# Patient Record
Sex: Female | Born: 1940 | Race: Black or African American | Hispanic: No | Marital: Married | Smoking: Former smoker
Health system: Southern US, Community
[De-identification: ages and names within clinical notes are randomized; demographics above are authoritative.]

## PROBLEM LIST (undated history)

## (undated) DIAGNOSIS — D219 Benign neoplasm of connective and other soft tissue, unspecified: Secondary | ICD-10-CM

## (undated) DIAGNOSIS — I10 Essential (primary) hypertension: Secondary | ICD-10-CM

## (undated) DIAGNOSIS — H269 Unspecified cataract: Secondary | ICD-10-CM

## (undated) DIAGNOSIS — E119 Type 2 diabetes mellitus without complications: Secondary | ICD-10-CM

## (undated) DIAGNOSIS — E66811 Obesity, class 1: Secondary | ICD-10-CM

## (undated) DIAGNOSIS — H409 Unspecified glaucoma: Secondary | ICD-10-CM

## (undated) DIAGNOSIS — E785 Hyperlipidemia, unspecified: Secondary | ICD-10-CM

## (undated) DIAGNOSIS — M810 Age-related osteoporosis without current pathological fracture: Secondary | ICD-10-CM

## (undated) DIAGNOSIS — R002 Palpitations: Secondary | ICD-10-CM

## (undated) DIAGNOSIS — M1990 Unspecified osteoarthritis, unspecified site: Secondary | ICD-10-CM

## (undated) DIAGNOSIS — E669 Obesity, unspecified: Secondary | ICD-10-CM

## (undated) HISTORY — PX: TUBAL LIGATION: SHX77

## (undated) HISTORY — DX: Unspecified glaucoma: H40.9

## (undated) HISTORY — DX: Obesity, unspecified: E66.9

## (undated) HISTORY — DX: Unspecified cataract: H26.9

## (undated) HISTORY — DX: Palpitations: R00.2

## (undated) HISTORY — DX: Essential (primary) hypertension: I10

## (undated) HISTORY — DX: Benign neoplasm of connective and other soft tissue, unspecified: D21.9

## (undated) HISTORY — DX: Hyperlipidemia, unspecified: E78.5

## (undated) HISTORY — DX: Unspecified osteoarthritis, unspecified site: M19.90

## (undated) HISTORY — PX: ABDOMINAL HYSTERECTOMY: SHX81

## (undated) HISTORY — DX: Type 2 diabetes mellitus without complications: E11.9

## (undated) HISTORY — DX: Obesity, class 1: E66.811

## (undated) HISTORY — DX: Age-related osteoporosis without current pathological fracture: M81.0

---

## 1982-06-01 HISTORY — PX: KNEE SURGERY: SHX244

## 1999-03-11 ENCOUNTER — Encounter: Admission: RE | Admit: 1999-03-11 | Discharge: 1999-06-09 | Payer: Self-pay | Admitting: Emergency Medicine

## 2002-12-22 ENCOUNTER — Encounter: Payer: Self-pay | Admitting: Gastroenterology

## 2006-06-24 ENCOUNTER — Ambulatory Visit: Payer: Self-pay | Admitting: Gastroenterology

## 2006-07-07 ENCOUNTER — Ambulatory Visit: Payer: Self-pay | Admitting: Gastroenterology

## 2008-12-25 ENCOUNTER — Encounter: Payer: Self-pay | Admitting: Gastroenterology

## 2009-02-22 ENCOUNTER — Ambulatory Visit: Payer: Self-pay | Admitting: Gastroenterology

## 2009-02-22 DIAGNOSIS — E785 Hyperlipidemia, unspecified: Secondary | ICD-10-CM

## 2009-02-22 DIAGNOSIS — Z860100 Personal history of colon polyps, unspecified: Secondary | ICD-10-CM | POA: Insufficient documentation

## 2009-02-22 DIAGNOSIS — D509 Iron deficiency anemia, unspecified: Secondary | ICD-10-CM

## 2009-02-22 DIAGNOSIS — E1169 Type 2 diabetes mellitus with other specified complication: Secondary | ICD-10-CM | POA: Insufficient documentation

## 2009-02-25 ENCOUNTER — Encounter: Payer: Self-pay | Admitting: Gastroenterology

## 2009-02-25 ENCOUNTER — Ambulatory Visit: Payer: Self-pay | Admitting: Gastroenterology

## 2009-02-26 ENCOUNTER — Telehealth: Payer: Self-pay | Admitting: Gastroenterology

## 2009-02-26 ENCOUNTER — Encounter: Payer: Self-pay | Admitting: Gastroenterology

## 2009-02-27 ENCOUNTER — Encounter: Payer: Self-pay | Admitting: Gastroenterology

## 2009-03-25 ENCOUNTER — Ambulatory Visit: Payer: Self-pay | Admitting: Gastroenterology

## 2009-03-25 ENCOUNTER — Telehealth: Payer: Self-pay | Admitting: Gastroenterology

## 2009-03-25 LAB — CONVERTED CEMR LAB
Basophils Absolute: 0 10*3/uL (ref 0.0–0.1)
Basophils Relative: 1 % (ref 0.0–3.0)
Eosinophils Absolute: 0.2 10*3/uL (ref 0.0–0.7)
Eosinophils Relative: 3.9 % (ref 0.0–5.0)
HCT: 32.9 % — ABNORMAL LOW (ref 36.0–46.0)
Lymphocytes Relative: 43.6 % (ref 12.0–46.0)
MCV: 87.6 fL (ref 78.0–100.0)
Neutrophils Relative %: 42.5 % — ABNORMAL LOW (ref 43.0–77.0)

## 2009-03-26 ENCOUNTER — Ambulatory Visit: Payer: Self-pay | Admitting: Gastroenterology

## 2009-06-25 ENCOUNTER — Telehealth: Payer: Self-pay | Admitting: Gastroenterology

## 2009-06-25 ENCOUNTER — Encounter: Payer: Self-pay | Admitting: Gastroenterology

## 2009-07-08 ENCOUNTER — Ambulatory Visit: Payer: Self-pay | Admitting: Gastroenterology

## 2009-07-08 LAB — CONVERTED CEMR LAB
OCCULT 1: NEGATIVE
OCCULT 2: NEGATIVE
OCCULT 3: NEGATIVE
OCCULT 4: NEGATIVE

## 2009-08-23 ENCOUNTER — Encounter: Admission: RE | Admit: 2009-08-23 | Discharge: 2009-08-23 | Payer: Self-pay | Admitting: Cardiology

## 2009-08-30 ENCOUNTER — Ambulatory Visit (HOSPITAL_COMMUNITY): Admission: RE | Admit: 2009-08-30 | Discharge: 2009-08-30 | Payer: Self-pay | Admitting: Cardiology

## 2009-08-30 HISTORY — PX: CARDIAC CATHETERIZATION: SHX172

## 2009-09-05 ENCOUNTER — Inpatient Hospital Stay (HOSPITAL_COMMUNITY): Admission: RE | Admit: 2009-09-05 | Discharge: 2009-09-09 | Payer: Self-pay | Admitting: Orthopedic Surgery

## 2009-09-05 HISTORY — PX: JOINT REPLACEMENT: SHX530

## 2010-07-03 NOTE — Letter (Signed)
Summary: Results Letter  [facility]  [address] [place], [place] [address]   Phone: [phone]  Fax: [phone]        July 08, 2009 MRN: [id number]    [facility] [address] [place], [place]  [address]    Dear Ms. Rindfleisch,  Your hemeoccult cards testing for microscopic bleeding were negative.  No further GI workup is required at this time.   Please continue with the recommendations that we previously discussed. Should you have any further questions or concerns, feel free to contact me.    Sincerely,  Barbette Hair. Arlyce Dice, M.D., [facility]          Sincerely,  Louis Meckel MD  This letter has been electronically signed by your physician.  Appended Document: Results Letter letter mailed

## 2010-07-03 NOTE — Progress Notes (Signed)
Summary: Follow up labs/hemoccults  Phone Note Outgoing Call Call back at [facility] Phone [phone]   Call placed by: Merri Ray CMA Duncan Dull),  June 25, 2009 10:32 AM Summary of Call: Called pt to remind her she needs 3 month follow up labs drawn. Orders in IDX. Also will mail follow up hemoccult cards for pt. She said she would try to be in sometime this week Initial call taken by: Merri Ray CMA Duncan Dull),  June 25, 2009 10:32 AM     Appended Document: Follow up labs/hemoccults Her labs are in Dr Marzetta Board folder. Drawn 06/25/2009 CMP,CBC

## 2010-08-20 LAB — BASIC METABOLIC PANEL
BUN: 13 mg/dL (ref 6–23)
CO2: 28 mEq/L (ref 19–32)
CO2: 30 mEq/L (ref 19–32)
Chloride: 103 mEq/L (ref 96–112)
Chloride: 108 mEq/L (ref 96–112)
Creatinine, Ser: 0.77 mg/dL (ref 0.4–1.2)
GFR calc Af Amer: >60 mL/min (ref >60)
GFR calc Af Amer: >60 mL/min (ref >60)
GFR calc non Af Amer: >60 mL/min (ref >60)
Glucose, Bld: 154 mg/dL — ABNORMAL HIGH (ref 70–99)
Glucose, Bld: 167 mg/dL — ABNORMAL HIGH (ref 70–99)
Potassium: 3.4 mEq/L — ABNORMAL LOW (ref 3.5–5.1)
Sodium: 136 mEq/L (ref 135–145)
Sodium: 139 mEq/L (ref 135–145)

## 2010-08-20 LAB — CBC
HCT: 24 % — ABNORMAL LOW (ref 36.0–46.0)
HCT: 24 % — ABNORMAL LOW (ref 36.0–46.0)
HCT: 24.6 % — ABNORMAL LOW (ref 36.0–46.0)
HCT: 27.9 % — ABNORMAL LOW (ref 36.0–46.0)
HCT: 34.2 % — ABNORMAL LOW (ref 36.0–46.0)
Hemoglobin: 11.4 g/dL — ABNORMAL LOW (ref 12.0–15.0)
Hemoglobin: 8 g/dL — ABNORMAL LOW (ref 12.0–15.0)
Hemoglobin: 9.4 g/dL — ABNORMAL LOW (ref 12.0–15.0)
MCHC: 33.3 g/dL (ref 30.0–36.0)
MCHC: 33.5 g/dL (ref 30.0–36.0)
MCHC: 33.5 g/dL (ref 30.0–36.0)
MCV: 86.7 fL (ref 78.0–100.0)
MCV: 87.2 fL (ref 78.0–100.0)
MCV: 87.2 fL (ref 78.0–100.0)
Platelets: 195 10*3/uL (ref 150–400)
Platelets: 227 10*3/uL (ref 150–400)
Platelets: 308 10*3/uL (ref 150–400)
RBC: 2.75 MIL/uL — ABNORMAL LOW (ref 3.87–5.11)
RBC: 2.79 MIL/uL — ABNORMAL LOW (ref 3.87–5.11)
RBC: 3.25 MIL/uL — ABNORMAL LOW (ref 3.87–5.11)
RDW: 15.6 % — ABNORMAL HIGH (ref 11.5–15.5)
RDW: 15.8 % — ABNORMAL HIGH (ref 11.5–15.5)
RDW: 16 % — ABNORMAL HIGH (ref 11.5–15.5)
WBC: 6.8 10*3/uL (ref 4.0–10.5)
WBC: 6.8 10*3/uL (ref 4.0–10.5)

## 2010-08-20 LAB — URINALYSIS, ROUTINE W REFLEX MICROSCOPIC
Bilirubin Urine: NEGATIVE
Glucose, UA: NEGATIVE mg/dL
Glucose, UA: NEGATIVE mg/dL
Hgb urine dipstick: NEGATIVE
Ketones, ur: NEGATIVE mg/dL
Nitrite: NEGATIVE
Nitrite: NEGATIVE
Protein, ur: NEGATIVE mg/dL
Protein, ur: NEGATIVE mg/dL
Specific Gravity, Urine: 1.009 (ref 1.005–1.030)
Urobilinogen, UA: 0.2 mg/dL (ref 0.0–1.0)
pH: 6 (ref 5.0–8.0)

## 2010-08-20 LAB — DIFFERENTIAL
Basophils Absolute: 0 10*3/uL (ref 0.0–0.1)
Eosinophils Absolute: 0.1 10*3/uL (ref 0.0–0.7)
Eosinophils Relative: 2 % (ref 0–5)
Lymphocytes Relative: 41 % (ref 12–46)

## 2010-08-20 LAB — CROSSMATCH
ABO/RH(D): O POS
Antibody Screen: NEGATIVE
Antibody Screen: NEGATIVE

## 2010-08-20 LAB — URINE MICROSCOPIC-ADD ON

## 2010-08-20 LAB — COMPREHENSIVE METABOLIC PANEL
Albumin: 3.7 g/dL (ref 3.5–5.2)
CO2: 30 mEq/L (ref 19–32)
Calcium: 9.3 mg/dL (ref 8.4–10.5)
Creatinine, Ser: 0.62 mg/dL (ref 0.4–1.2)
GFR calc Af Amer: >60 mL/min (ref >60)
Glucose, Bld: 167 mg/dL — ABNORMAL HIGH (ref 70–99)
Total Bilirubin: 0.4 mg/dL (ref 0.3–1.2)
Total Protein: 6.8 g/dL (ref 6.0–8.3)

## 2010-08-20 LAB — GLUCOSE, CAPILLARY
Glucose-Capillary: 127 mg/dL — ABNORMAL HIGH (ref 70–99)
Glucose-Capillary: 130 mg/dL — ABNORMAL HIGH (ref 70–99)
Glucose-Capillary: 137 mg/dL — ABNORMAL HIGH (ref 70–99)
Glucose-Capillary: 158 mg/dL — ABNORMAL HIGH (ref 70–99)
Glucose-Capillary: 165 mg/dL — ABNORMAL HIGH (ref 70–99)
Glucose-Capillary: 183 mg/dL — ABNORMAL HIGH (ref 70–99)
Glucose-Capillary: 187 mg/dL — ABNORMAL HIGH (ref 70–99)
Glucose-Capillary: 199 mg/dL — ABNORMAL HIGH (ref 70–99)
Glucose-Capillary: 141 mg/dL — ABNORMAL HIGH (ref 70–99)

## 2010-08-20 LAB — PROTIME-INR
INR: 1 (ref 0.00–1.49)
Prothrombin Time: 13.1 seconds (ref 11.6–15.2)

## 2010-08-20 LAB — ABO/RH: ABO/RH(D): O POS

## 2010-08-26 ENCOUNTER — Other Ambulatory Visit: Payer: Self-pay | Admitting: Gastroenterology

## 2010-12-29 ENCOUNTER — Other Ambulatory Visit: Payer: Self-pay | Admitting: Gastroenterology

## 2011-05-05 ENCOUNTER — Other Ambulatory Visit: Payer: Self-pay | Admitting: Gastroenterology

## 2011-06-23 ENCOUNTER — Encounter: Payer: Self-pay | Admitting: Gastroenterology

## 2011-07-04 DIAGNOSIS — H2589 Other age-related cataract: Secondary | ICD-10-CM | POA: Insufficient documentation

## 2011-07-04 DIAGNOSIS — H401133 Primary open-angle glaucoma, bilateral, severe stage: Secondary | ICD-10-CM | POA: Insufficient documentation

## 2011-07-14 ENCOUNTER — Ambulatory Visit (INDEPENDENT_AMBULATORY_CARE_PROVIDER_SITE_OTHER): Payer: 59 | Admitting: Family Medicine

## 2011-07-14 ENCOUNTER — Ambulatory Visit: Payer: 59

## 2011-07-14 VITALS — BP 120/74 | HR 82 | Temp 97.9°F | Resp 16 | Ht 64.25 in | Wt 188.4 lb

## 2011-07-14 DIAGNOSIS — M898X1 Other specified disorders of bone, shoulder: Secondary | ICD-10-CM

## 2011-07-14 DIAGNOSIS — M2550 Pain in unspecified joint: Secondary | ICD-10-CM

## 2011-07-14 DIAGNOSIS — M25519 Pain in unspecified shoulder: Secondary | ICD-10-CM

## 2011-07-14 MED ORDER — METHYLPREDNISOLONE ACETATE 40 MG/ML IJ SUSP: 40.0000 mg | Freq: Once | INTRAMUSCULAR | Status: DC

## 2011-07-14 MED ORDER — BUPIVACAINE HCL 0.5 % IJ SOLN: 2.0000 mL | Freq: Once | INTRAMUSCULAR | Status: DC

## 2011-07-14 MED ORDER — LIDOCAINE HCL (PF) 1 % IJ SOLN: 2.0000 mL | Freq: Once | INTRAMUSCULAR | Status: DC

## 2011-07-14 NOTE — Progress Notes (Signed)
[facility]:  Office Visit  Chief Complaint:  Chief Complaint  Patient presents with  . Shoulder Pain    x 1 week    HPI: ANESSA CHARLEY is a 71 y.o. female who complains of right shoulder pain with lifting. Uses arms to push off when she gets up. No trauma. Dull pain and more with External ROM. NO radiation, No numbness, weakness or tingling. NOticed also movement and sound of collare bone. Well controlled diabetes. Last injection of same shoulder > 1 year ago.   Past Medical History  Diagnosis Date  . Hypertension   . Hyperlipidemia   . Diabetes mellitus   . Glaucoma   . Fibroids    Past Surgical History  Procedure Date  . Joint replacement   . Abdominal hysterectomy    History   Social History  . Marital Status: Married    Spouse Name: N/A    Number of Children: N/A  . Years of Education: N/A   Social History Main Topics  . Smoking status: Former Smoker -- 0.5 packs/day for 20 years    Quit date: 07/13/1981  . Smokeless tobacco: None  . Alcohol Use: No  . Drug Use: No  . Sexually Active: Yes   Other Topics Concern  . None   Social History Narrative  . None   Family History  Problem Relation Age of Onset  . Diabetes Mother   . Heart disease Mother   . Diabetes Sister   . Hyperlipidemia Sister   . Diabetes Brother    No Known Allergies Prior to Admission medications   Medication Sig Start Date End Date Taking? Authorizing Provider  amLODipine (NORVASC) 10 MG tablet Take 10 mg by mouth daily.   Yes Historical Provider, MD  aspirin 81 MG tablet Take 81 mg by mouth daily.   Yes Historical Provider, MD  atorvastatin (LIPITOR) 20 MG tablet Take 20 mg by mouth daily.   Yes Historical Provider, MD  losartan (COZAAR) 25 MG tablet Take 25 mg by mouth daily.   Yes Historical Provider, MD  metFORMIN (GLUCOPHAGE) 500 MG tablet Take 500 mg by mouth 2 (two) times daily with a meal.   Yes Historical Provider, MD  Travoprost, BAK Free,  (TRAVATAN) 0.004 % SOLN ophthalmic solution Place 1 drop into both eyes at bedtime.   Yes Historical Provider, MD  famotidine (PEPCID) 40 MG tablet TAKE 1 TABLET BY MOUTH ONCE DAILY 12/29/10   Louis Meckel, MD     ROS: The patient denies fevers, chills, night sweats, unintentional weight loss, chest pain, palpitations, wheezing, dyspnea on exertion, nausea, vomiting, abdominal pain, dysuria, hematuria, melena, numbness, weakness, or tingling. + Right shoulder pain, high clavicle.  All other systems have been reviewed and were otherwise negative with the exception of those mentioned in the HPI and as above.    PHYSICAL EXAM: Filed Vitals:   07/14/11 1322  BP: 120/74  Pulse: 82  Temp: 97.9 F (36.6 C)  Resp: 16   Filed Vitals:   07/14/11 1322  Height: 5' 4.25" (1.632 m)  Weight: 188 lb 6.4 oz (85.458 kg)   Body mass index is 32.09 kg/(m^2).  General: Alert, no acute distress HEENT:  Normocephalic, atraumatic, oropharynx patent. Cardiovascular:  Regular rate and rhythm, no rubs murmurs or gallops.  No Carotid bruits, radial pulse intact. No pedal edema.  Respiratory: Clear to auscultation bilaterally.  No wheezes, rales, or rhonchi.  No cyanosis, no use of accessory musculature GI: No  organomegaly, abdomen is soft and non-tender, positive bowel sounds.  No masses. Skin: No rashes. Neurologic: Facial musculature symmetric. Psychiatric: Patient is appropriate throughout our interaction. Lymphatic: No axillary or cervical lymphadenopathy Musculoskeletal: Gait intact. Right shoulder: No atrophy, no erythema, + decrease external and internal AROM and PROM. 5/5 strength. + Neers and Hawkins. No biceps issue. Neurovascualrly intact, + radial pulse.     EKG/XRAY:   Primary read interpreted by Dr. Conley Rolls at [facility]. Right Shoulder and clavicle no fx or dislocation   ASSESSMENT/PLAN: Encounter Diagnoses  Name Primary?  . Shoulder pain Yes  . Clavicle pain    1. 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 Xrays were unremarakable, no appreciable fx or dislocations.  Patient verbally consented to procedure after risks and benefits explained.Area prepped with betadine, no blood loss.Jovita Gamma pt injection of 40 mg Depomedrol, 2 ml of 1% lidocaine without epi, 2 ml of 0.5% marcaine without complications. Advise patient to monitor blood glucose. She states that her Diabetes is well controlled. F/u prn. May use NSAIDs prn. If sxs persist then will refer to PT or orthopedics.     Hamilton Capri PHUONG, DO 07/14/2011 3:43 PM

## 2011-07-14 NOTE — Progress Notes (Deleted)
  Subjective:    Patient ID: Angela Chambers, female    DOB: Feb 13, 1941, 71 y.o.   MRN: [id number]  HPI    Review of Systems     Objective:   Physical Exam        Assessment & Plan:

## 2011-07-30 ENCOUNTER — Other Ambulatory Visit: Payer: Self-pay | Admitting: Family Medicine

## 2011-07-30 MED ORDER — ATORVASTATIN CALCIUM 20 MG PO TABS: 20.0000 mg | ORAL_TABLET | Freq: Every day | ORAL | Status: DC

## 2011-07-30 MED ORDER — AMLODIPINE BESYLATE 10 MG PO TABS: 10.0000 mg | ORAL_TABLET | Freq: Every day | ORAL | Status: DC

## 2011-08-19 ENCOUNTER — Ambulatory Visit: Payer: 59 | Admitting: Family Medicine

## 2011-08-25 ENCOUNTER — Ambulatory Visit (INDEPENDENT_AMBULATORY_CARE_PROVIDER_SITE_OTHER): Payer: 59 | Admitting: Family Medicine

## 2011-08-25 ENCOUNTER — Encounter: Payer: Self-pay | Admitting: Family Medicine

## 2011-08-25 VITALS — BP 138/67 | HR 92 | Temp 97.3°F | Resp 16 | Ht 64.0 in | Wt 189.0 lb

## 2011-08-25 DIAGNOSIS — Z79899 Other long term (current) drug therapy: Secondary | ICD-10-CM

## 2011-08-25 DIAGNOSIS — M25519 Pain in unspecified shoulder: Secondary | ICD-10-CM

## 2011-08-25 DIAGNOSIS — I10 Essential (primary) hypertension: Secondary | ICD-10-CM

## 2011-08-25 DIAGNOSIS — M25511 Pain in right shoulder: Secondary | ICD-10-CM

## 2011-08-25 DIAGNOSIS — E785 Hyperlipidemia, unspecified: Secondary | ICD-10-CM | POA: Insufficient documentation

## 2011-08-25 DIAGNOSIS — E1169 Type 2 diabetes mellitus with other specified complication: Secondary | ICD-10-CM | POA: Insufficient documentation

## 2011-08-25 MED ORDER — METFORMIN HCL 500 MG PO TABS: 500.0000 mg | ORAL_TABLET | Freq: Two times a day (BID) | ORAL | Status: DC

## 2011-08-25 MED ORDER — LOSARTAN POTASSIUM 25 MG PO TABS: 25.0000 mg | ORAL_TABLET | Freq: Every day | ORAL | Status: DC

## 2011-08-25 MED ORDER — AMLODIPINE BESYLATE 10 MG PO TABS: 10.0000 mg | ORAL_TABLET | Freq: Every day | ORAL | Status: DC

## 2011-08-25 MED ORDER — ATORVASTATIN CALCIUM 20 MG PO TABS: 20.0000 mg | ORAL_TABLET | Freq: Every day | ORAL | Status: DC

## 2011-08-25 NOTE — Progress Notes (Signed)
  Subjective:    Patient ID: Angela Chambers, female    DOB: 19-Mar-1941, 71 y.o.   MRN: [id number]  HPI  This 71 y.o. AA female is here today for medication RFs and re-evaluation of shoulder pain.  The injection performed at last visit at [facility] did not resolve the issue. She has tried Aleve 1 tab  for a few doses without significant improvement.         She is doing well with her anti-hypertensives without side effects. She and her husband are travelling  to [place] this week; she is very excited and is organized  especially with her medications and other  necessities for the trip. She is maintaining a lifestyle that includes good nutrition in order to maintain  a normal blood sugar; her last A1c= 5.9% (November 2012)   Review of Systems  Constitutional: Negative.   Respiratory: Negative for cough, chest tightness and shortness of breath.   Cardiovascular: Negative for chest pain, palpitations and leg swelling.  Musculoskeletal:       Left shoulder pain with certain movements above the shoulder and reaching in front  Neurological: Negative for dizziness, weakness, light-headedness and headaches.  All other systems reviewed and are negative.       Objective:   Physical Exam  Vitals reviewed. Constitutional: She is oriented to person, place, and time. She appears well-developed and well-nourished. No distress.  HENT:  Head: Normocephalic and atraumatic.  Eyes: Conjunctivae are normal. No scleral icterus.  Neck: Normal range of motion. Neck supple. No thyromegaly present.  Cardiovascular: Normal rate, regular rhythm and normal heart sounds.   Pulmonary/Chest: Effort normal. No respiratory distress.  Musculoskeletal:       Shoulders: left is nontender except over biceps tendon. ROM preserved with slight give-way against resistance. No muscle atrophy, deformity or swelling   Lymphadenopathy:    She has no cervical adenopathy.  Neurological: She is alert and oriented to  person, place, and time. No cranial nerve deficit. Coordination normal.  Skin: Skin is warm and dry.          Assessment & Plan:   1. Shoulder pain, right  Ambulatory referral to Physical Therapy Try Aleve 2 tabs every 12 hours with food for a few days along with topical analgesic (i.e. Arnica Gel); decrease Aleve dose to 1 tab every 12 hours once pain is less; continue to "baby" shoulder but d some gentle ROM exercises until evaluated by PT  2. HTN (hypertension)  Continue  Amlodipine 10 mg and Losartan 25 mg RFs x 12 months  3. Medication management  RF: Metformin x 6 months; pt to follow-up in 3-4 months For Diabetes visit  4. Hyperlipidemia  Well controlled on Atorvastatin (RFed x 12 months) Lipids last checked in Nov 2012: TC= 186   HDL=78    LDL=89 TGs= 96

## 2011-08-25 NOTE — Patient Instructions (Addendum)
Biceps Tendon Tendinitis (Distal) with Rehab Tendinitis involves inflammation and pain over the affected tendon. The distal biceps tendon (near the elbow) is vulnerable to tendinitis. Distal biceps tendonitis is usually due to the bony bump near the elbow (bicipital tuberosity) causing increased friction over the tendon. The biceps tendon attaches the biceps muscle to one bone in the elbow and two in the shoulder. It is important for proper function of the elbow and for turning the palm upward (supination). SYMPTOMS   Pain, aching, tenderness, and sometimes warmth or redness over the front of the elbow.   Pain when bending the elbow or turning the palm up, using the wrist, especially if performed against resistance.   Crackling sound (crepitation) when the tendon or elbow is moved or touched.  CAUSES  The symptoms of biceps tendonitis are due to inflammation of the tendon. Inflammation may be caused by:  Strain from sudden increase in amount or intensity of activity.   Direct blow or injury to the elbow (uncommon).   Overuse or repetitive elbow bending or wrist rotation, particularly when turning the palm up, or with elbow hyperextension.  RISK INCREASES WITH:  Sports that involve contact or overhead arm activity (throwing sports, gymnastics, weightlifting, bodybuilding, rock climbing).   Heavy labor.   Poor strength and flexibility.   Failure to warm up properly before activity.   Injury to other structures of the elbow.   Restraint of the elbow.  PREVENTION  Warm up and stretch properly before activity.   Allow time for recovery between activities.   Maintain physical fitness:   Strength, flexibility, and endurance.   Cardiovascular fitness.   Learn and use proper exercise technique.  PROGNOSIS  With proper treatment, biceps tendon tendonitis is usually curable within 6 weeks.  RELATED COMPLICATIONS   Longer healing time if not properly treated or if not given enough  time to heal.   Chronically inflamed tendon that causes persistent pain with activity, that may progress to constant pain and potentially rupture of the tendon.   Recurring symptoms, especially if activity is resumed too soon, with overuse or with poor technique.  TREATMENT  Treatment first involves ice and medicine to reduce pain and inflammation. Modify activities that cause pain, to reduce the chances of causing the condition to get worse. Strengthening and stretching exercises should be performed to promote proper use of the muscles of the elbow. These exercise may be performed at home or with a therapist. Other treatments may be given such as ultrasound or heat therapy. Surgery is usually not recommended.  MEDICATION  If pain medicine is needed, nonsteroidal anti-inflammatory medicines (aspirin and ibuprofen), or other minor pain relievers (acetaminophen), are often advised.   Do not take pain relieving medication for 7 days before surgery.   Prescription pain relievers may be given if your caregiver thinks they are needed. Use only as directed and only as much as you need.  HEAT AND COLD:   Cold treatment (icing) should be applied for 10 to 15 minutes every 2 to 3 hours for inflammation and pain, and immediately after activity that aggravates your symptoms. Use ice packs or an ice massage.   Heat treatment may be used before performing stretching and strengthening activities prescribed by your caregiver, physical therapist, or athletic trainer. Use a heat pack or a warm water soak.  SEEK MEDICAL CARE IF:   Symptoms get worse or do not improve in 2 weeks, despite treatment.   New, unexplained symptoms develop. (Drugs used in  treatment may produce side effects.)  EXERCISES  RANGE OF MOTION (ROM) AND STRETCHING EXERCISES - Biceps Tendon Tendinitis (Distal) These exercises may help you when beginning to rehabilitate your injury. Your symptoms may go away with or without further  involvement from your physician, physical therapist, or athletic trainer. While completing these exercises, remember:   Restoring tissue flexibility helps normal motion to return to the joints. This allows healthier, less painful movement and activity.   An effective stretch should be held for at least 30 seconds.   A stretch should never be painful. You should only feel a gentle lengthening or release in the stretched tissue.  STRETCH - Elbow Flexors   Lie on a firm bed or countertop on your back. Be sure that you are in a comfortable position which will allow you to relax your arm muscles.   Place a folded towel under your right / left upper arm, so that your elbow and shoulder are at the same height. Extend your arm; your elbow should not rest on the bed or towel.   Allow the weight of your hand to straighten your elbow. Keep your arm and chest muscles relaxed. Your caretaker may ask you to increase the intensity of your stretch by adding a small wrist or hand weight.   Hold for __________ seconds. You should feel a stretch on the inside of your elbow. Slowly return to the starting position.  Repeat __________ times. Complete this exercise __________ times per day. RANGE OF MOTION - Supination, Active   Stand or sit with your elbows at your side. Bend your right / left elbow to 90 degrees.   Turn your palm upward until you feel a gentle stretch on the inside of your forearm.   Hold this position for __________ seconds. Slowly release and return to the starting position.  Repeat __________ times. Complete this stretch __________ times per day.  RANGE OF MOTION - Pronation, Active   Stand or sit with your elbows at your side. Bend your right / left elbow to 90 degrees.   Turn your palm downward until you feel a gentle stretch on the top of your forearm.   Hold this position for __________ seconds. Slowly release and return to the starting position.  Repeat __________ times. Complete  this stretch __________ times per day.  STRENGTHENING EXERCISES - Biceps Tendon Tendinitis (Distal) These exercises may help you when beginning to rehabilitate your injury. They may resolve your symptoms with or without further involvement from your physician, physical therapist or athletic trainer. While completing these exercises, remember:   Muscles can gain both the endurance and the strength needed for everyday activities through controlled exercises.   Complete these exercises as instructed by your physician, physical therapist or athletic trainer. Increase the resistance and repetitions only as guided.   You may experience muscle soreness or fatigue, but the pain or discomfort you are trying to eliminate should never get worse during these exercises. If this pain does get worse, stop and make sure you are following the directions exactly. If the pain is still present after adjustments, discontinue the exercise until you can discuss the trouble with your clinician.  STRENGTH - Elbow Flexors, Isometric   Stand or sit upright on a firm surface. Place your right / left arm so that your hand is palm-up and at the height of your waist.   Place your opposite hand on top of your forearm. Gently push down as your right / left arm resists.  Push as hard as you can with both arms without causing any pain or movement at your right / left elbow. Hold this stationary position for __________ seconds.   Gradually release the tension in both arms. Allow your muscles to relax completely before repeating.  Repeat __________ times. Complete this exercise __________ times per day. STRENGTH - Forearm Supinators   Sit with your right / left forearm supported on a table, keeping your elbow below shoulder height. Rest your hand over the edge, palm down.   Gently grip a hammer or a soup ladle.   Without moving your elbow, slowly turn your palm and hand upward to a "thumbs-up" position.   Hold this position for  __________ seconds. Slowly return to the starting position.  Repeat __________ times. Complete this exercise __________ times per day.  STRENGTH - Forearm Pronators   Sit with your right / left forearm supported on a table, keeping your elbow below shoulder height. Rest your hand over the edge, palm up.   Gently grip a hammer or a soup ladle.   Without moving your elbow, slowly turn your palm and hand upward to a "thumbs-up" position.   Hold this position for __________ seconds. Slowly return to the starting position.  Repeat __________ times. Complete this exercise __________ times per day.  STRENGTH - Elbow Flexors, Supinated  With good posture, stand or sit on a firm chair without armrests. Allow your right / left arm to rest at your side with your palm facing forward.   Holding a __________ weight, or gripping a rubber exercise band or tubing, bring your hand toward your shoulder.   Allow your muscles to control the resistance as your hand returns to your side.  Repeat __________ times. Complete this exercise __________ times per day.  STRENGTH - Elbow Flexors, Neutral  With good posture, stand or sit on a firm chair without armrests. Allow your right / left arm to rest at your side with your thumb facing forward.   Holding a __________ weight, or gripping a rubber exercise band or tubing, bring your hand toward your shoulder.   Allow your muscles to control the resistance as your hand returns to your side.  Repeat __________ times. Complete this exercise __________ times per day.  Document Released: 05/18/2005 Document Revised: 05/07/2011 Document Reviewed: 08/30/2008 [facility] Patient Information 2012 [place], [place].    You have been advise to take Aleve 2 tabs twice daily with food. Also use Topical Arnica gel for relief.  You will be referred to Physical Therapy.

## 2011-09-09 ENCOUNTER — Encounter: Payer: Self-pay | Admitting: Gastroenterology

## 2011-09-22 ENCOUNTER — Encounter: Payer: Self-pay | Admitting: Gastroenterology

## 2011-09-22 ENCOUNTER — Ambulatory Visit (AMBULATORY_SURGERY_CENTER): Payer: Self-pay

## 2011-09-22 VITALS — Ht 64.5 in | Wt 192.4 lb

## 2011-09-22 DIAGNOSIS — Z1211 Encounter for screening for malignant neoplasm of colon: Secondary | ICD-10-CM

## 2011-09-22 DIAGNOSIS — Z86010 Personal history of colonic polyps: Secondary | ICD-10-CM

## 2011-09-22 MED ORDER — PEG-KCL-NACL-NASULF-NA ASC-C 100 G PO SOLR: 1.0000 | Freq: Once | ORAL | Status: AC

## 2011-10-06 ENCOUNTER — Encounter: Payer: Self-pay | Admitting: Gastroenterology

## 2011-10-06 ENCOUNTER — Ambulatory Visit (AMBULATORY_SURGERY_CENTER): Payer: 59 | Admitting: Gastroenterology

## 2011-10-06 VITALS — BP 131/51 | HR 72 | Temp 96.5°F | Resp 17 | Ht 64.5 in | Wt 192.0 lb

## 2011-10-06 DIAGNOSIS — Z1211 Encounter for screening for malignant neoplasm of colon: Secondary | ICD-10-CM

## 2011-10-06 DIAGNOSIS — Z86010 Personal history of colonic polyps: Secondary | ICD-10-CM

## 2011-10-06 LAB — GLUCOSE, CAPILLARY: Glucose-Capillary: 144 mg/dL — ABNORMAL HIGH (ref 70–99)

## 2011-10-06 MED ORDER — SODIUM CHLORIDE 0.9 % IV SOLN: 500.0000 mL | INTRAVENOUS | Status: DC

## 2011-10-06 NOTE — Progress Notes (Signed)
The pt tolerated the colonoscopy very well. Maw   

## 2011-10-06 NOTE — Patient Instructions (Signed)
YOU HAD AN ENDOSCOPIC PROCEDURE TODAY AT THE [facility]: Refer to the procedure report that was given to you for any specific questions about what was found during the examination.  If the procedure report does not answer your questions, please call your gastroenterologist to clarify.  If you requested that your care partner not be given the details of your procedure findings, then the procedure report has been included in a sealed envelope for you to review at your convenience later.  YOU SHOULD EXPECT: Some feelings of bloating in the abdomen. Passage of more gas than usual.  Walking can help get rid of the air that was put into your GI tract during the procedure and reduce the bloating. If you had a lower endoscopy (such as a colonoscopy or flexible sigmoidoscopy) you may notice spotting of blood in your stool or on the toilet paper. If you underwent a bowel prep for your procedure, then you may not have a normal bowel movement for a few days.  DIET: Your first meal following the procedure should be a light meal and then it is ok to progress to your normal diet.  A half-sandwich or bowl of soup is an example of a good first meal.  Heavy or fried foods are harder to digest and may make you feel nauseous or bloated.  Likewise meals heavy in dairy and vegetables can cause extra gas to form and this can also increase the bloating.  Drink plenty of fluids but you should avoid alcoholic beverages for 24 hours.  ACTIVITY: Your care partner should take you home directly after the procedure.  You should plan to take it easy, moving slowly for the rest of the day.  You can resume normal activity the day after the procedure however you should NOT DRIVE or use heavy machinery for 24 hours (because of the sedation medicines used during the test).    SYMPTOMS TO REPORT IMMEDIATELY: A gastroenterologist can be reached at any hour.  During normal business hours, 8:30 AM to 5:00 PM Monday through Friday,  call [phone].  After hours and on weekends, please call the GI answering service at [phone] who will take a message and have the physician on call contact you.   Following lower endoscopy (colonoscopy or flexible sigmoidoscopy):  Excessive amounts of blood in the stool  Significant tenderness or worsening of abdominal pains  Swelling of the abdomen that is new, acute  Fever of 100F or higher  Following upper endoscopy (EGD)  Vomiting of blood or coffee ground material  New chest pain or pain under the shoulder blades  Painful or persistently difficult swallowing  New shortness of breath  Fever of 100F or higher  Black, tarry-looking stools  FOLLOW UP: If any biopsies were taken you will be contacted by phone or by letter within the next 1-3 weeks.  Call your gastroenterologist if you have not heard about the biopsies in 3 weeks.  Our staff will call the home number listed on your records the next business day following your procedure to check on you and address any questions or concerns that you may have at that time regarding the information given to you following your procedure. This is a courtesy call and so if there is no answer at the home number and we have not heard from you through the emergency physician on call, we will assume that you have returned to your regular daily activities without incident.  SIGNATURES/CONFIDENTIALITY: You and/or your care   partner have signed paperwork which will be entered into your electronic medical record.  These signatures attest to the fact that that the information above on your After Visit Summary has been reviewed and is understood.  Full responsibility of the confidentiality of this discharge information lies with you and/or your care-partner.  

## 2011-10-06 NOTE — Op Note (Signed)
[facility] [address] [place], [place]  [address]  COLONOSCOPY PROCEDURE REPORT  PATIENT:  Angela Chambers, Angela Chambers  MR#:  [id number] BIRTHDATE:  1940/08/05, 71 yrs. old  GENDER:  female ENDOSCOPIST:  Barbette Hair. Arlyce Dice, MD REF. BY:  Dow Adolph, M.D. PROCEDURE DATE:  10/06/2011 PROCEDURE:  Diagnostic Colonoscopy ASA CLASS:  Class II INDICATIONS:  Screening, history of pre-cancerous (adenomatous) colon polyps 2004 colon - polyp 2008 colon - normal MEDICATIONS:   MAC sedation, administered by CRNA propofol 200mg IV  DESCRIPTION OF PROCEDURE:   After the risks benefits and alternatives of the procedure were thoroughly explained, informed consent was obtained.  Digital rectal exam was performed and revealed no abnormalities.   The LB CF-[id number] endoscope was introduced through the anus and advanced to the cecum, which was identified by both the appendix and ileocecal valve, without limitations.  The quality of the prep was excellent, using MoviPrep.  The instrument was then slowly withdrawn as the colon was fully examined. <<PROCEDUREIMAGES>>  FINDINGS:  A normal appearing cecum, ileocecal valve, and appendiceal orifice were identified. The ascending, hepatic flexure, transverse, splenic flexure, descending, sigmoid colon, and rectum appeared unremarkable (see image1 and image2). Retroflexed views in the rectum revealed no abnormalities.    The time to cecum =  1) 4.50  minutes. The scope was then withdrawn in 1) 6.75  minutes from the cecum and the procedure completed. COMPLICATIONS:  None ENDOSCOPIC IMPRESSION: 1) Normal colon RECOMMENDATIONS: 1) Colonoscopy REPEAT EXAM:  In 10 year(s) for Colonoscopy.  ______________________________ Barbette Hair. Arlyce Dice, MD  CC:  n. eSIGNED:   Barbette Hair. Ande Therrell at 10/06/2011 11:04 AM  Lorelei Pont, [phone]

## 2011-10-07 ENCOUNTER — Telehealth: Payer: Self-pay | Admitting: *Deleted

## 2011-10-07 NOTE — Telephone Encounter (Signed)
  Follow up Call-  Call back number 10/06/2011  Post procedure Call Back phone  # [phone]  Permission to leave phone message Yes     Patient questions:  Do you have a fever, pain , or abdominal swelling? no Pain Score  0 *  Have you tolerated food without any problems? yes  Have you been able to return to your normal activities? yes  Do you have any questions about your discharge instructions: Diet   no Medications  no Follow up visit  no  Do you have questions or concerns about your Care? no  Actions: * If pain score is 4 or above: No action needed, pain <4.

## 2011-10-14 ENCOUNTER — Encounter: Payer: Self-pay | Admitting: Family Medicine

## 2011-10-14 ENCOUNTER — Ambulatory Visit (INDEPENDENT_AMBULATORY_CARE_PROVIDER_SITE_OTHER): Payer: 59 | Admitting: Family Medicine

## 2011-10-14 VITALS — BP 169/97 | HR 86 | Temp 98.0°F | Resp 16 | Ht 64.75 in | Wt 189.0 lb

## 2011-10-14 DIAGNOSIS — M25511 Pain in right shoulder: Secondary | ICD-10-CM

## 2011-10-14 DIAGNOSIS — E119 Type 2 diabetes mellitus without complications: Secondary | ICD-10-CM

## 2011-10-14 DIAGNOSIS — M25519 Pain in unspecified shoulder: Secondary | ICD-10-CM

## 2011-10-14 NOTE — Patient Instructions (Signed)

## 2011-10-15 ENCOUNTER — Encounter: Payer: Self-pay | Admitting: Family Medicine

## 2011-10-15 NOTE — Progress Notes (Signed)
  Subjective:    Patient ID: Angela Chambers, female    DOB: [birth year], 71 y.o.   MRN: [id number]  HPI  This 71 y.o. AA female returns today after successful completion of PT for right shoulder pain.   She now has almost complete ROM and is essentially pain free. She is doing exercises at home as  instructed. She is able to sleep on right side without waking with pain. She has no GI distress with medications   Review of Systems Noncontributory     Objective:   Physical Exam  Nursing note and vitals reviewed. Constitutional: She is oriented to person, place, and time. She appears well-developed and well-nourished. No distress.  HENT:  Head: Normocephalic and atraumatic.  Neck: Normal range of motion. Neck supple.  Cardiovascular: Normal rate.   Pulmonary/Chest: Effort normal. No respiratory distress.  Musculoskeletal: Normal range of motion. She exhibits no tenderness.       Right shoulder ROM is significantly improved (see scanned report from [facility])  Neurological: She is alert and oriented to person, place, and time. No cranial nerve deficit. She exhibits normal muscle tone.  Skin: Skin is warm and dry.  Psychiatric: She has a normal mood and affect. Her behavior is normal.    Results for orders placed in visit on 10/14/11  POCT GLYCOSYLATED HEMOGLOBIN (HGB A1C)      Component Value Range   Hemoglobin A1C 6.4           Assessment & Plan:   1. Right shoulder pain  Improved- continue ROM home exercises  2. Type II or unspecified type diabetes mellitus without mention of complication, not stated as uncontrolled  Good control but Hgb A1C has increased from 5.9% in November 2012

## 2011-11-30 DIAGNOSIS — I119 Hypertensive heart disease without heart failure: Secondary | ICD-10-CM | POA: Insufficient documentation

## 2011-11-30 DIAGNOSIS — I10 Essential (primary) hypertension: Secondary | ICD-10-CM

## 2011-11-30 HISTORY — PX: TRANSTHORACIC ECHOCARDIOGRAM: SHX275

## 2011-11-30 HISTORY — DX: Essential (primary) hypertension: I10

## 2011-12-21 ENCOUNTER — Telehealth: Payer: Self-pay | Admitting: Family Medicine

## 2011-12-21 MED ORDER — CEPHALEXIN 500 MG PO TABS: 500.0000 mg | ORAL_TABLET | Freq: Two times a day (BID) | ORAL | Status: AC

## 2011-12-21 NOTE — Telephone Encounter (Signed)
Gave pt lab results and pt stated she had not been given the results or tx'd by [facility]. Asked me to call Rx to [facility]. Sending in Rx as written by Dr Cleta Alberts.

## 2011-12-21 NOTE — Telephone Encounter (Signed)
Called Pt regarding labs done at [facility] 12/11/11. Pt has E. Coli in urine- needs Rx called in per Dr. Cleta Alberts if wasn't treated at [facility].  LMOM to CB to let us know. Paper copy of labs done is at [facility]. Please ask what pharmacy Pt uses and we will call in Rx for her. JF

## 2011-12-21 NOTE — Telephone Encounter (Signed)
Hard copy of labs with Rx written if still needed at [facility]' desk when pt CB

## 2011-12-22 ENCOUNTER — Encounter: Payer: Self-pay | Admitting: Emergency Medicine

## 2012-02-17 ENCOUNTER — Encounter: Payer: Self-pay | Admitting: Family Medicine

## 2012-02-17 ENCOUNTER — Ambulatory Visit (INDEPENDENT_AMBULATORY_CARE_PROVIDER_SITE_OTHER): Payer: 59 | Admitting: Family Medicine

## 2012-02-17 VITALS — BP 138/91 | HR 70 | Temp 98.2°F | Resp 16 | Ht 65.0 in | Wt 188.0 lb

## 2012-02-17 DIAGNOSIS — E119 Type 2 diabetes mellitus without complications: Secondary | ICD-10-CM

## 2012-02-17 DIAGNOSIS — Z23 Encounter for immunization: Secondary | ICD-10-CM

## 2012-02-17 NOTE — Patient Instructions (Addendum)
Continue current medications[facility] and walk most days of the week for 15-20 minutes. In a few months, you can increase that to 30 minutes.

## 2012-02-18 MED ORDER — METFORMIN HCL 500 MG PO TABS: 500.0000 mg | ORAL_TABLET | Freq: Two times a day (BID) | ORAL | Status: DC

## 2012-02-18 NOTE — Progress Notes (Signed)
S; This 71 y.o. AA female is here for DM follow-up; she takes Metformin bid with meals, without adverse effects. FSBS have been in low 100s. Denies hypoglycemia. Not exercising regularly but plan to resumes fitness plan with walking since weather is starting to cool down. She reports a recent [facility] visit during which her medications were changed- Losartan was increased to 50 mg, Amlodipine was discontinued and Toprol XL was added. She feels well and is having no adverse effects with these meds.  Also here for Flu vaccination.  ROS: Noncontributory  O:  Filed Vitals:   02/17/12 1056  BP: 138/91                                               Weight down 1 pound  Pulse: 70  Temp: 98.2 F (36.8 C)  Resp: 16   GEN: In NAD;WN,WD. HENT: [place]/AT; EOMI, conj/scl clear. COR: RRR LUNGS: Normal resp rate and effort. SKIN: W&D NEURO: A&O x 3; CNs intact; otherwise nonfocal.   A/P: 1. Type II or unspecified type diabetes mellitus without mention of complication, not stated as uncontrolled -controlled with last A1C= 6.4% in May 2013 A1C not checked- out of cartridges and wait time to run control=30 minutes Pt determined to improve lifestyle with more activity and healthier food choices.  2. Need for prophylactic vaccination and inoculation against influenza  Flu vaccine greater than or equal to 3yo preservative free IM

## 2012-05-18 ENCOUNTER — Encounter: Payer: Self-pay | Admitting: Family Medicine

## 2012-05-18 ENCOUNTER — Ambulatory Visit (INDEPENDENT_AMBULATORY_CARE_PROVIDER_SITE_OTHER): Payer: 59 | Admitting: Family Medicine

## 2012-05-18 VITALS — BP 156/78 | HR 73 | Temp 97.0°F | Resp 16 | Ht 65.0 in | Wt 185.0 lb

## 2012-05-18 DIAGNOSIS — E119 Type 2 diabetes mellitus without complications: Secondary | ICD-10-CM

## 2012-05-18 DIAGNOSIS — Z760 Encounter for issue of repeat prescription: Secondary | ICD-10-CM

## 2012-05-18 DIAGNOSIS — I10 Essential (primary) hypertension: Secondary | ICD-10-CM

## 2012-05-18 LAB — POCT GLYCOSYLATED HEMOGLOBIN (HGB A1C): Hemoglobin A1C: 6.4

## 2012-05-18 MED ORDER — METOPROLOL SUCCINATE ER 25 MG PO TB24: 25.0000 mg | ORAL_TABLET | Freq: Every day | ORAL | Status: DC

## 2012-05-18 MED ORDER — LOSARTAN POTASSIUM 25 MG PO TABS: 50.0000 mg | ORAL_TABLET | Freq: Every day | ORAL | Status: DC

## 2012-05-18 MED ORDER — METFORMIN HCL 500 MG PO TABS: 500.0000 mg | ORAL_TABLET | Freq: Two times a day (BID) | ORAL | Status: DC

## 2012-05-18 MED ORDER — TRAVOPROST (BAK FREE) 0.004 % OP SOLN: 1.0000 [drp] | Freq: Every day | OPHTHALMIC | Status: DC

## 2012-05-18 NOTE — Patient Instructions (Signed)
Your Diabetes control remains good (A1c=6.4%). Over next few months, work on improving nutrition and staying active. I have refilled all medications. Next visit will be in 4 months for complete physical and labs.

## 2012-05-18 NOTE — Progress Notes (Signed)
S:  This 71 y.o. AA female is here for DM and HTN follow-up and for med refills. She feels well and has not had any adverse effects w/ medications. She feels like she has been overeating during the Holidays (too much dressing!) but plans to get back on track in the New Year. Her husband had knee surgery and she has been nursing him back to health. She will now refocus on self-care and start exercising again.   All immunizations are UTD.  ROS: Negative for unexplained weight gain or loss, fatigue, vision disturbances, CPor tightness, palpitations, SOB or DOE, edema, symptoms of hypoglycemia, GI problems, myalgias, HA, dizziness, numbness, weakness or syncope.  O:  Filed Vitals:   05/18/12 1125  BP: 156/78                                       Weight down 3 lbs since Sept.  Pulse: 73  Temp: 97 F (36.1 C)  Resp: 16   GEN: In NAD; WN,WD. HENT: /AT. EOMI w/ clear conj/ sclerae. Oroph normal. Otherwise unremarkable. COR: RRR. No edema. Distal pulses 1+/=. LUNGS: Normal resp rate and effort. NEURO: A&o x 3; Cns intact. Nonfocal.  Results for orders placed in visit on 05/18/12  POCT GLYCOSYLATED HEMOGLOBIN (HGB A1C)      Component Value Range   Hemoglobin A1C 6.4       A/P: 1. Type II or unspecified type diabetes mellitus without mention of complication, not stated as uncontrolled  Continue current dose of Metformin Resume exercise and healthier nutrition.  Comprehensive metabolic panel  2. HTN, goal below 140/80  BP has been measured as controlled in past No medication change at this time.  3. Medication refill     Next visit for CPE and fasting lipids.

## 2012-05-19 LAB — COMPREHENSIVE METABOLIC PANEL
ALT: 12 U/L (ref 0–35)
AST: 13 U/L (ref 0–37)
Chloride: 104 mEq/L (ref 96–112)
Creat: 0.87 mg/dL (ref 0.50–1.10)
Sodium: 144 mEq/L (ref 135–145)
Total Bilirubin: 0.8 mg/dL (ref 0.3–1.2)
Total Protein: 6.3 g/dL (ref 6.0–8.3)

## 2012-05-19 NOTE — Progress Notes (Signed)
Quick Note:  Please notify pt that results are normal.   Provide pt with copy of labs. ______ 

## 2012-05-24 ENCOUNTER — Ambulatory Visit: Payer: 59

## 2012-05-24 ENCOUNTER — Ambulatory Visit (INDEPENDENT_AMBULATORY_CARE_PROVIDER_SITE_OTHER): Payer: 59 | Admitting: Family Medicine

## 2012-05-24 VITALS — BP 161/86 | HR 85 | Temp 97.7°F | Resp 16 | Ht 65.0 in | Wt 188.0 lb

## 2012-05-24 DIAGNOSIS — M25461 Effusion, right knee: Secondary | ICD-10-CM

## 2012-05-24 DIAGNOSIS — M25569 Pain in unspecified knee: Secondary | ICD-10-CM

## 2012-05-24 DIAGNOSIS — M25469 Effusion, unspecified knee: Secondary | ICD-10-CM

## 2012-05-24 DIAGNOSIS — M79609 Pain in unspecified limb: Secondary | ICD-10-CM

## 2012-05-24 MED ORDER — OXYCODONE-ACETAMINOPHEN 5-325 MG PO TABS: 1.0000 | ORAL_TABLET | Freq: Three times a day (TID) | ORAL | Status: DC | PRN

## 2012-05-24 NOTE — Progress Notes (Signed)
71 yo woman with right knee pain and swelling.  It hurts real bad whether weight bearing or not.  The pain began yesterday and she took 2 aleve last night, but pain continues.  She has been on her feet quite a bit for the holidays.  PMHX: crush injury with ORIF in 1984, no problems since then until yesterdy  Objective:  NAD, using cane for walking  Right knee grossly swollen with palpable effusion.  Decreased flexion and extension.  Somewhat tender lateral supracondylar area of distal femur.  [place] skin unremarkable  [facility] reading (PRIMARY) by  Dr. Milus Glazier. Right knee shows postsurgical bolts with erosion into the lateral joint line, diffuse joint space narrowing, marked reactive bony changes  Assessment: Patient will need a total knee replacement to correct for many different problems that have occurred over the years as well as the acute pain syndrome.  Plan: Percocet and urgent referral to orthopedics crutches

## 2012-08-17 ENCOUNTER — Encounter: Payer: Self-pay | Admitting: Emergency Medicine

## 2012-08-21 ENCOUNTER — Other Ambulatory Visit: Payer: Self-pay | Admitting: Family Medicine

## 2012-09-16 ENCOUNTER — Encounter: Payer: 59 | Admitting: Family Medicine

## 2012-11-11 ENCOUNTER — Other Ambulatory Visit: Payer: Self-pay | Admitting: Family Medicine

## 2012-11-24 ENCOUNTER — Encounter: Payer: Self-pay | Admitting: Family Medicine

## 2012-11-24 ENCOUNTER — Ambulatory Visit (INDEPENDENT_AMBULATORY_CARE_PROVIDER_SITE_OTHER): Payer: 59 | Admitting: Family Medicine

## 2012-11-24 VITALS — BP 142/78 | HR 69 | Temp 97.8°F | Resp 16 | Ht 64.5 in | Wt 187.0 lb

## 2012-11-24 DIAGNOSIS — E119 Type 2 diabetes mellitus without complications: Secondary | ICD-10-CM

## 2012-11-24 DIAGNOSIS — Z13820 Encounter for screening for osteoporosis: Secondary | ICD-10-CM

## 2012-11-24 DIAGNOSIS — Z0000 Encounter for general adult medical examination without abnormal findings: Secondary | ICD-10-CM

## 2012-11-24 DIAGNOSIS — Z862 Personal history of diseases of the blood and blood-forming organs and certain disorders involving the immune mechanism: Secondary | ICD-10-CM

## 2012-11-24 DIAGNOSIS — I10 Essential (primary) hypertension: Secondary | ICD-10-CM

## 2012-11-24 LAB — CBC WITH DIFFERENTIAL/PLATELET
Basophils Absolute: 0 10*3/uL (ref 0.0–0.1)
Basophils Relative: 1 % (ref 0–1)
Eosinophils Absolute: 0.1 10*3/uL (ref 0.0–0.7)
Eosinophils Relative: 3 % (ref 0–5)
HCT: 35.1 % — ABNORMAL LOW (ref 36.0–46.0)
Hemoglobin: 12.5 g/dL (ref 12.0–15.0)
Lymphocytes Relative: 43 % (ref 12–46)
Lymphs Abs: 1.7 10*3/uL (ref 0.7–4.0)
MCH: 28.5 pg (ref 26.0–34.0)
MCHC: 35.6 g/dL (ref 30.0–36.0)
MCV: 80.1 fL (ref 78.0–100.0)
Monocytes Absolute: 0.4 10*3/uL (ref 0.1–1.0)
Monocytes Relative: 9 % (ref 3–12)
Neutro Abs: 1.8 10*3/uL (ref 1.7–7.7)
Neutrophils Relative %: 44 % (ref 43–77)
Platelets: 314 10*3/uL (ref 150–400)
RBC: 4.38 MIL/uL (ref 3.87–5.11)
RDW: 15.2 % (ref 11.5–15.5)
WBC: 4 10*3/uL (ref 4.0–10.5)

## 2012-11-24 LAB — COMPREHENSIVE METABOLIC PANEL
ALT: 12 U/L (ref 0–35)
AST: 14 U/L (ref 0–37)
Albumin: 4.2 g/dL (ref 3.5–5.2)
Alkaline Phosphatase: 91 U/L (ref 39–117)
BUN: 16 mg/dL (ref 6–23)
CO2: 27 mEq/L (ref 19–32)
Calcium: 9.7 mg/dL (ref 8.4–10.5)
Chloride: 105 mEq/L (ref 96–112)
Creat: 0.76 mg/dL (ref 0.50–1.10)
Glucose, Bld: 126 mg/dL — ABNORMAL HIGH (ref 70–99)
Potassium: 4.1 mEq/L (ref 3.5–5.3)
Sodium: 139 mEq/L (ref 135–145)
Total Bilirubin: 0.8 mg/dL (ref 0.3–1.2)
Total Protein: 6.6 g/dL (ref 6.0–8.3)

## 2012-11-24 LAB — LIPID PANEL
Cholesterol: 176 mg/dL (ref 0–200)
HDL: 65 mg/dL (ref >39)
LDL Cholesterol: 88 mg/dL (ref 0–99)
Total CHOL/HDL Ratio: 2.7 Ratio
Triglycerides: 116 mg/dL (ref <150)
VLDL: 23 mg/dL (ref 0–40)

## 2012-11-24 LAB — POCT GLYCOSYLATED HEMOGLOBIN (HGB A1C): Hemoglobin A1C: 6.1

## 2012-11-24 MED ORDER — METOPROLOL SUCCINATE ER 25 MG PO TB24: 25.0000 mg | ORAL_TABLET | Freq: Every day | ORAL | Status: DC

## 2012-11-24 MED ORDER — METFORMIN HCL 500 MG PO TABS: 500.0000 mg | ORAL_TABLET | Freq: Two times a day (BID) | ORAL | Status: DC

## 2012-11-24 MED ORDER — LOSARTAN POTASSIUM 25 MG PO TABS: 50.0000 mg | ORAL_TABLET | Freq: Every day | ORAL | Status: DC

## 2012-11-24 NOTE — Progress Notes (Signed)
Subjective:    Patient ID: Angela Chambers, female    DOB: 03-27-1941, 72 y.o.   MRN: [id number]  HPI  This pleasant 72 y.o. AA female is here for [facility] subsequent annual CPE. She has HTN and  was seen earlier this year by Dr. Herbie Baltimore, her cardiologist (ECG done at that visit). Type II DM is  well controlled; FSBS= 130-160; no hypoglycemia reported. Ophthalmology visit iin May 2014  with specialist in [facility] office. Chronic knee pain- pt is s/p bilateral knee surgeries; ORTHO  manages w/ watchful waiting. Pt is compliant w/ medications and denies any adverse effects.   HCM: MMG- Feb 2014 (re-image in 6 mos).            CRS- 2013 (normal).            IMM- current.            DEXA- not recent.  Patient Active Problem List   Diagnosis Date Noted  . HTN (hypertension) 08/25/2011  . Hyperlipidemia 08/25/2011  . DIABETES MELLITUS- TYPE II 02/22/2009  . IRON DEFICIENCY 02/22/2009  . PERSONAL HISTORY OF COLONIC POLYPS 02/22/2009   PMHx, Soc Hx and Fam Hx rviewed. Pt stays busy w/ volunteering and family care issues. Husband  just underwent knee replacement.  HANDS Questionnaire Depression screening: score=0   Review of Systems  Musculoskeletal: Positive for arthralgias and gait problem.       Chronic knee pain.  Skin:       Lesion on dorsum of R foot- scar from childhood accident.  All other systems reviewed and are negative.       Objective:   Physical Exam  Nursing note and vitals reviewed. Constitutional: She is oriented to person, place, and time. Vital signs are normal. She appears well-developed and well-nourished. No distress.  HENT:  Head: Normocephalic and atraumatic.  Right Ear: Hearing, external ear and ear canal normal. Tympanic membrane is scarred.  Left Ear: Hearing, external ear and ear canal normal. Tympanic membrane is scarred.  Nose: Nose normal. No mucosal edema, rhinorrhea, nasal deformity or septal deviation. Right sinus exhibits no maxillary sinus  tenderness and no frontal sinus tenderness. Left sinus exhibits no maxillary sinus tenderness and no frontal sinus tenderness.  Mouth/Throat: Uvula is midline, oropharynx is clear and moist and mucous membranes are normal. She has dentures. No oral lesions.  Eyes: Conjunctivae, EOM and lids are normal. Pupils are equal, round, and reactive to light. No scleral icterus.  Wears corrective lenses.  Neck: Normal range of motion. Neck supple. No JVD present. No thyromegaly present.  Cardiovascular: Normal rate, regular rhythm, S1 normal, S2 normal, normal heart sounds and intact distal pulses.  PMI is not displaced.  Exam reveals no gallop and no friction rub.   No murmur heard. Pulmonary/Chest: Effort normal and breath sounds normal. No respiratory distress. She has no wheezes. She has no rales.  Abdominal: Soft. Bowel sounds are normal. She exhibits no distension, no abdominal bruit, no pulsatile midline mass and no mass. There is no hepatosplenomegaly. There is no tenderness. There is no guarding and no CVA tenderness. No hernia.  Genitourinary:  Deferred.  Musculoskeletal:  Well healed bilateral post-op scars- anterior knee joints.  Lymphadenopathy:    She has no cervical adenopathy.  Neurological: She is alert and oriented to person, place, and time. She has normal strength and normal reflexes. She displays no atrophy. No cranial nerve deficit or sensory deficit. She exhibits normal muscle tone. Coordination normal.  Skin: Skin is  warm and dry.  R foot- dorsum w/ 1.5 cm hyperpigmented round nontender scar  Psychiatric: She has a normal mood and affect. Her behavior is normal. Judgment and thought content normal.         Assessment & Plan:  Routine general medical examination at a health care facility  Unspecified essential hypertension - Stable and controlled on current medication.     Plan: Comprehensive metabolic panel  Type II or unspecified type diabetes mellitus without mention of  complication, not stated as uncontrolled - Stable; no changes at this time.     Plan: POCT glycosylated hemoglobin (Hb A1C), Lipid panel  Personal history of diseases of blood and blood-forming organs - Plan: CBC with Differential  Screening for osteoporosis - Plan: DG Bone Density

## 2012-11-24 NOTE — Patient Instructions (Addendum)
Keeping You Healthy  Get These Tests  Blood Pressure- Have your blood pressure checked by your healthcare provider at least once a year.  Normal blood pressure is 120/80.  Weight- Have your body mass index (BMI) calculated to screen for obesity.  BMI is a measure of body fat based on height and weight.  You can calculate your own BMI at https://www.west-esparza.com/  Cholesterol- Have your cholesterol checked every year.  Diabetes- Have your blood sugar checked every year if you have high blood pressure, high cholesterol, a family history of diabetes or if you are overweight.  Pap Smear- Have a pap smear every 1 to 3 years if you have been sexually active.  If you are older than 65 and recent pap smears have been normal you may not need additional pap smears.  In addition, if you have had a hysterectomy  For benign disease additional pap smears are not necessary.  Mammogram-Yearly mammograms are essential for early detection of breast cancer  Screening for Colon Cancer- Colonoscopy starting at age 59. Screening may begin sooner depending on your family history and other health conditions.  Follow up colonoscopy as directed by your Gastroenterologist.  Screening for Osteoporosis- Screening begins at age 59 with bone density scanning, sooner if you are at higher risk for developing Osteoporosis.  Get these medicines  Calcium with Vitamin D- Your body requires 1200-1500 mg of Calcium a day and [phone] IU of Vitamin D a day.  You can only absorb 500 mg of Calcium at a time therefore Calcium must be taken in 2 or 3 separate doses throughout the day.  Hormones- Hormone therapy has been associated with increased risk for certain cancers and heart disease.  Talk to your healthcare provider about if you need relief from menopausal symptoms.  Aspirin- Ask your healthcare provider about taking Aspirin to prevent Heart Disease and Stroke.  Get these Immuniztions- all immunizations are current.  Flu  shot- Every fall  Pneumonia shot- Once after the age of 22; if you are younger ask your healthcare provider if you need a pneumonia shot.  Tetanus- Every ten years.  Zostavax- Once after the age of 50 to prevent shingles.  Take these steps  Don't smoke- Your healthcare provider can help you quit. For tips on how to quit, ask your healthcare provider or go to www.smokefree.gov or call 1-800 QUIT-NOW.  Be physically active- Exercise 5 days a week for a minimum of 30 minutes.  If you are not already physically active, start slow and gradually work up to 30 minutes of moderate physical activity.  Try walking, dancing, bike riding, swimming, etc.  Eat a healthy diet- Eat a variety of healthy foods such as fruits, vegetables, whole grains, low fat milk, low fat cheeses, yogurt, lean meats, chicken, fish, eggs, dried beans, tofu, etc.  For more information go to www.thenutritionsource.org  Dental visit- Brush and floss teeth twice daily; visit your dentist twice a year.  Eye exam- Visit your Optometrist or Ophthalmologist yearly.  Drink alcohol in moderation- Limit alcohol intake to one drink or less a day.  Never drink and drive.  Depression- Your emotional health is as important as your physical health.  If you're feeling down or losing interest in things you normally enjoy, please talk to your healthcare provider.  Seat Belts- can save your life; always wear one  Smoke/Carbon Monoxide detectors- These detectors need to be installed on the appropriate level of your home.  Replace batteries at least once a year.  Violence- If anyone is threatening or hurting you, please tell your healthcare provider.  Living Will/ Health care power of attorney- Discuss with your healthcare provider and fam

## 2013-01-10 ENCOUNTER — Ambulatory Visit (INDEPENDENT_AMBULATORY_CARE_PROVIDER_SITE_OTHER): Payer: 59 | Admitting: Family Medicine

## 2013-01-10 VITALS — BP 156/84 | HR 72 | Temp 97.5°F | Resp 16 | Ht 65.0 in | Wt 188.0 lb

## 2013-01-10 DIAGNOSIS — K529 Noninfective gastroenteritis and colitis, unspecified: Secondary | ICD-10-CM

## 2013-01-10 DIAGNOSIS — R197 Diarrhea, unspecified: Secondary | ICD-10-CM

## 2013-01-10 DIAGNOSIS — K5289 Other specified noninfective gastroenteritis and colitis: Secondary | ICD-10-CM

## 2013-01-10 DIAGNOSIS — R110 Nausea: Secondary | ICD-10-CM

## 2013-01-10 DIAGNOSIS — R42 Dizziness and giddiness: Secondary | ICD-10-CM

## 2013-01-10 LAB — POCT CBC
Granulocyte percent: 54 % (ref 37–80)
HCT, POC: 40.1 % (ref 37.7–47.9)
Hemoglobin: 12.8 g/dL (ref 12.2–16.2)
Lymph, poc: 2.3 (ref 0.6–3.4)
MCH, POC: 29.1 pg (ref 27–31.2)
MCHC: 31.9 g/dL (ref 31.8–35.4)
MCV: 91.1 fL (ref 80–97)
MID (cbc): 0.4 (ref 0–0.9)
MPV: 8.4 fL (ref 0–99.8)
POC Granulocyte: 3.2 (ref 2–6.9)
POC LYMPH PERCENT: 38.7 %L (ref 10–50)
POC MID %: 7.3 % (ref 0–12)
Platelet Count, POC: 396 10*3/uL (ref 142–424)
RBC: 4.4 M/uL (ref 4.04–5.48)
RDW, POC: 15.6 %
WBC: 5.9 10*3/uL (ref 4.6–10.2)

## 2013-01-10 LAB — COMPREHENSIVE METABOLIC PANEL
Albumin: 4.3 g/dL (ref 3.5–5.2)
BUN: 11 mg/dL (ref 6–23)
CO2: 29 mEq/L (ref 19–32)
Calcium: 9.8 mg/dL (ref 8.4–10.5)
Chloride: 103 mEq/L (ref 96–112)
Glucose, Bld: 114 mg/dL — ABNORMAL HIGH (ref 70–99)
Potassium: 4 mEq/L (ref 3.5–5.3)
Sodium: 142 mEq/L (ref 135–145)
Total Protein: 6.9 g/dL (ref 6.0–8.3)

## 2013-01-10 LAB — COMPREHENSIVE METABOLIC PANEL WITH GFR
ALT: 12 U/L (ref 0–35)
AST: 13 U/L (ref 0–37)
Alkaline Phosphatase: 93 U/L (ref 39–117)
Creat: 0.75 mg/dL (ref 0.50–1.10)
Total Bilirubin: 0.5 mg/dL (ref 0.3–1.2)

## 2013-01-10 LAB — GLUCOSE, POCT (MANUAL RESULT ENTRY): POC Glucose: 118 mg/dl — AB (ref 70–99)

## 2013-01-10 MED ORDER — ONDANSETRON HCL 4 MG PO TABS: 4.0000 mg | ORAL_TABLET | Freq: Three times a day (TID) | ORAL | Status: DC | PRN

## 2013-01-10 NOTE — Progress Notes (Signed)
 [facility]:  Office Visit  Chief Complaint:  Chief Complaint  Patient presents with  . Diarrhea    ALL SYMPTOMS SINCE 01/10/2013  . Nausea  . Dizziness    HPI: RATEEL BELDIN is a 72 y.o. female who complains of nonbloody diarrhea, nausea without emesis, and also dizziness when she stands up. She had 4 epsiodes of diarrhea since 3 am. No abd pain, has only eaten oatmenal today. Last episode of diarrhea was 1 pm. She has taken all her meds. No CP, no SOB. She is usually lactose intolerant and ate pizza with her grandson when they went to [employer] in [place] this weekend. She denies being around anyone with similar sxs. The diarrhea is actually "loose stools" but everything she eats causes her to have a BM.   Past Medical History  Diagnosis Date  . Hypertension   . Hyperlipidemia   . Diabetes mellitus   . Glaucoma   . Fibroids   . Arthritis   . Cataract   . Osteoporosis    Past Surgical History  Procedure Laterality Date  . Knee surgery  1984    per pt screws in R knee  . 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 Abdominal hysterectomy  10/18/ 1982    TAH-BSO for fibroids and adenomatous hyperplasia  . Tubal ligation     History   Social History  . Marital Status: Married    Spouse Name: N/A    Number of Children: N/A  . Years of Education: N/A   Social History Main Topics  . Smoking status: Former Smoker -- 0.50 packs/day for 20 years    Types: Cigarettes    Quit date: 07/13/1981  . Smokeless tobacco: Never Used  . Alcohol Use: No  . Drug Use: No  . Sexually Active: Yes   Other Topics Concern  . None   Social History Narrative  . None   Family History  Problem Relation Age of Onset  . Diabetes Mother   . Heart disease Mother   . Diabetes Sister   . Hyperlipidemia Sister   . Diabetes Brother    Allergies  Allergen Reactions  . Lisinopril Cough   Prior to Admission medications   Medication Sig Start Date End Date  Taking? Authorizing Provider  aspirin 81 MG tablet Take 81 mg by mouth daily.   Yes Historical Provider, MD  atorvastatin (LIPITOR) 20 MG tablet take 1 tablet by mouth once daily 08/21/12  Yes Morrell Riddle, PA-C  losartan (COZAAR) 25 MG tablet Take 2 tablets (50 mg total) by mouth daily. 11/24/12  Yes Maurice March, MD  metFORMIN (GLUCOPHAGE) 500 MG tablet Take 1 tablet (500 mg total) by mouth 2 (two) times daily with a meal. 11/24/12  Yes Maurice March, MD  metoprolol succinate (TOPROL-XL) 25 MG 24 hr tablet Take 1 tablet (25 mg total) by mouth daily. 11/24/12  Yes Maurice March, MD  Multiple Vitamin (MULTIVITAMIN) tablet Take 1 tablet by mouth daily. Women's One a Day MVI   Yes Historical Provider, MD  Naproxen Sodium (ALEVE PO) Take by mouth. Take 2 tabs bid   Yes Historical Provider, MD  Travoprost, BAK Free, (TRAVATAN) 0.004 % SOLN ophthalmic solution Place 1 drop into both eyes at bedtime. 05/18/12  Yes Maurice March, MD     ROS: The patient denies fevers, chills, night sweats, unintentional weight loss, chest pain, palpitations, wheezing, dyspnea on exertion,  vomiting, abdominal pain, dysuria, hematuria, melena, numbness, weakness, or tingling.  All other systems have been reviewed and were otherwise negative with the exception of those mentioned in the HPI and as above.    PHYSICAL EXAM: Filed Vitals:   01/10/13 1649  BP: 156/84  Pulse: 72  Temp:   Resp:    Filed Vitals:   01/10/13 1451  Height: 5\' 5"  (1.651 m)  Weight: 188 lb (85.276 kg)   Body mass index is 31.28 kg/(m^2).  General: Alert, no acute distress HEENT:  Normocephalic, atraumatic, oropharynx patent. Oral mucosa nl. EOMI, PERRLA, no exudates, TM nl Cardiovascular:  Regular rate and rhythm, no rubs murmurs or gallops.  No Carotid bruits, radial pulse intact. No pedal edema.  Respiratory: Clear to auscultation bilaterally.  No wheezes, rales, or rhonchi.  No cyanosis, no use of accessory  musculature GI: No organomegaly, abdomen is soft and non-tender, positive bowel sounds.  No masses. Skin: No rashes. Neurologic: Facial musculature symmetric. Psychiatric: Patient is appropriate throughout our interaction. Lymphatic: No cervical lymphadenopathy Musculoskeletal: Gait intact.   LABS: Results for orders placed in visit on 01/10/13  POCT CBC      Result Value Range   WBC 5.9  4.6 - 10.2 K/uL   Lymph, poc 2.3  0.6 - 3.4   POC LYMPH PERCENT 38.7  10 - 50 %L   MID (cbc) 0.4  0 - 0.9   POC MID % 7.3  0 - 12 %M   POC Granulocyte 3.2  2 - 6.9   Granulocyte percent 54.0  37 - 80 %G   RBC 4.40  4.04 - 5.48 M/uL   Hemoglobin 12.8  12.2 - 16.2 g/dL   HCT, POC 08.6  57.8 - 47.9 %   MCV 91.1  80 - 97 fL   MCH, POC 29.1  27 - 31.2 pg   MCHC 31.9  31.8 - 35.4 g/dL   RDW, POC 46.9     Platelet Count, POC 396  142 - 424 K/uL   MPV 8.4  0 - 99.8 fL  GLUCOSE, POCT (MANUAL RESULT ENTRY)      Result Value Range   POC Glucose 118 (*) 70 - 99 mg/dl     EKG/XRAY:   Primary read interpreted by Dr. Conley Rolls at [facility].   ASSESSMENT/PLAN: Encounter Diagnoses  Name Primary?  . Nausea alone Yes  . Diarrhea   . Dizziness and giddiness   . Noninfectious gastroenteritis and colitis    Food intolerance vs viral GI bug BRAT and push fluids Orthostatics are normal Rx Zofran for nausea CMP abs pending Gross sideeffects, risk and benefits, and alternatives of medications d/w patient. Patient is aware that all medications have potential sideeffects and we are unable to predict every sideeffect or drug-drug interaction that may occur. F/u prn   ,  PHUONG, DO 01/10/2013 5:18 PM

## 2013-01-10 NOTE — Patient Instructions (Signed)
Diet for Diarrhea, Adult °Frequent, runny stools (diarrhea) may be caused or worsened by food or drink. Diarrhea may be relieved by changing your diet. Since diarrhea can last up to 7 days, it is easy for you to lose too much fluid from the body and become dehydrated. Fluids that are lost need to be replaced. Along with a modified diet, make sure you drink enough fluids to keep your urine clear or pale yellow. °DIET INSTRUCTIONS °· Ensure adequate fluid intake (hydration): have 1 cup (8 oz) of fluid for each diarrhea episode. Avoid fluids that contain simple sugars or sports drinks, fruit juices, whole milk products, and sodas. Your urine should be clear or pale yellow if you are drinking enough fluids. Hydrate with an oral rehydration solution that you can purchase at pharmacies, retail stores, and online. You can prepare an oral rehydration solution at home by mixing the following ingredients together: °·   tsp table salt. °· ¾ tsp baking soda. °·  tsp salt substitute containing potassium chloride. °· 1  tablespoons sugar. °· 1 L (34 oz) of water. °· Certain foods and beverages may increase the speed at which food moves through the gastrointestinal (GI) tract. These foods and beverages should be avoided and include: °· Caffeinated and alcoholic beverages. °· High-fiber foods, such as raw fruits and vegetables, nuts, seeds, and whole grain breads and cereals. °· Foods and beverages sweetened with sugar alcohols, such as xylitol, sorbitol, and mannitol. °· Some foods may be well tolerated and may help thicken stool including: °· Starchy foods, such as rice, toast, pasta, low-sugar cereal, oatmeal, grits, baked potatoes, crackers, and bagels.   °· Bananas.   °· Applesauce. °· Add probiotic-rich foods to help increase healthy bacteria in the GI tract, such as yogurt and fermented milk products. °RECOMMENDED FOODS AND BEVERAGES °Starches °Choose foods with less than 2 g of fiber per serving. °· Recommended:  White,  French, and pita breads, plain rolls, buns, bagels. Plain muffins, matzo. Soda, saltine, or graham crackers. Pretzels, melba toast, zwieback. Cooked cereals made with water: cornmeal, farina, cream cereals. Dry cereals: refined corn, wheat, rice. Potatoes prepared any way without skins, refined macaroni, spaghetti, noodles, refined rice. °· Avoid:  Bread, rolls, or crackers made with whole wheat, multi-grains, rye, bran seeds, nuts, or coconut. Corn tortillas or taco shells. Cereals containing whole grains, multi-grains, bran, coconut, nuts, raisins. Cooked or dry oatmeal. Coarse wheat cereals, granola. Cereals advertised as "high-fiber." Potato skins. Whole grain pasta, wild or brown rice. Popcorn. Sweet potatoes, yams. Sweet rolls, doughnuts, waffles, pancakes, sweet breads. °Vegetables °· Recommended: Strained tomato and vegetable juices. Most well-cooked and canned vegetables without seeds. Fresh: Tender lettuce, cucumber without the skin, cabbage, spinach, bean sprouts. °· Avoid: Fresh, cooked, or canned: Artichokes, baked beans, beet greens, broccoli, Brussels sprouts, corn, kale, legumes, peas, sweet potatoes. Cooked: Green or red cabbage, spinach. Avoid large servings of any vegetables because vegetables shrink when cooked, and they contain more fiber per serving than fresh vegetables. °Fruit °· Recommended: Cooked or canned: Apricots, applesauce, cantaloupe, cherries, fruit cocktail, grapefruit, grapes, kiwi, mandarin oranges, peaches, pears, plums, watermelon. Fresh: Apples without skin, ripe banana, grapes, cantaloupe, cherries, grapefruit, peaches, oranges, plums. Keep servings limited to ½ cup or 1 piece. °· Avoid: Fresh: Apples with skin, apricots, mangoes, pears, raspberries, strawberries. Prune juice, stewed or dried prunes. Dried fruits, raisins, dates. Large servings of all fresh fruits. °Protein °· Recommended: Ground or well-cooked tender beef, ham, veal, lamb, pork, or poultry. Eggs. Fish,  oysters, shrimp,   lobster, other seafoods. Liver, organ meats. °· Avoid: Tough, fibrous meats with gristle. Peanut butter, smooth or chunky. Cheese, nuts, seeds, legumes, dried peas, beans, lentils. °Dairy °· Recommended: Yogurt, lactose-free milk, kefir, drinkable yogurt, buttermilk, soy milk, or plain hard cheese. °· Avoid: Milk, chocolate milk, beverages made with milk, such as milkshakes. °Soups °· Recommended: Bouillon, broth, or soups made from allowed foods. Any strained soup. °· Avoid: Soups made from vegetables that are not allowed, cream or milk-based soups. °Desserts and Sweets °· Recommended: Sugar-free gelatin, sugar-free frozen ice pops made without sugar alcohol. °· Avoid: Plain cakes and cookies, pie made with fruit, pudding, custard, cream pie. Gelatin, fruit, ice, sherbet, frozen ice pops. Ice cream, ice milk without nuts. Plain hard candy, honey, jelly, molasses, syrup, sugar, chocolate syrup, gumdrops, marshmallows. °Fats and Oils °· Recommended: Limit fats to less than 8 tsp per day. °· Avoid: Seeds, nuts, olives, avocados. Margarine, butter, cream, mayonnaise, salad oils, plain salad dressings. Plain gravy, crisp bacon without rind. °Beverages °· Recommended: Water, decaffeinated teas, oral rehydration solutions, sugar-free beverages not sweetened with sugar alcohols. °· Avoid: Fruit juices, caffeinated beverages (coffee, tea, soda), alcohol, sports drinks, or lemon-lime soda. °Condiments °· Recommended: Ketchup, mustard, horseradish, vinegar, cocoa powder. Spices in moderation: allspice, basil, bay leaves, celery powder or leaves, cinnamon, cumin powder, curry powder, ginger, mace, marjoram, onion or garlic powder, oregano, paprika, parsley flakes, ground pepper, rosemary, sage, savory, tarragon, thyme, turmeric. °· Avoid: Coconut, honey. °Document Released: 08/08/2003 Document Revised: 02/10/2012 Document Reviewed: 10/02/2011 °ExitCare® Patient Information ©2014 [employer]. ° °Viral  Gastroenteritis °Viral gastroenteritis is also known as stomach flu. This condition affects the stomach and intestinal tract. It can cause sudden diarrhea and vomiting. The illness typically lasts 3 to 8 days. Most people develop an immune response that eventually gets rid of the virus. While this natural response develops, the virus can make you quite ill. °CAUSES  °Many different viruses can cause gastroenteritis, such as rotavirus or noroviruses. You can catch one of these viruses by consuming contaminated food or water. You may also catch a virus by sharing utensils or other personal items with an infected person or by touching a contaminated surface. °SYMPTOMS  °The most common symptoms are diarrhea and vomiting. These problems can cause a severe loss of body fluids (dehydration) and a body salt (electrolyte) imbalance. Other symptoms may include: °· Fever. °· Headache. °· Fatigue. °· Abdominal pain. °DIAGNOSIS  °Your caregiver can usually diagnose viral gastroenteritis based on your symptoms and a physical exam. A stool sample may also be taken to test for the presence of viruses or other infections. °TREATMENT  °This illness typically goes away on its own. Treatments are aimed at rehydration. The most serious cases of viral gastroenteritis involve vomiting so severely that you are not able to keep fluids down. In these cases, fluids must be given through an intravenous line (IV). °HOME CARE INSTRUCTIONS  °· Drink enough fluids to keep your urine clear or pale yellow. Drink small amounts of fluids frequently and increase the amounts as tolerated. °· Ask your caregiver for specific rehydration instructions. °· Avoid: °· Foods high in sugar. °· Alcohol. °· Carbonated drinks. °· Tobacco. °· Juice. °· Caffeine drinks. °· Extremely hot or cold fluids. °· Fatty, greasy foods. °· Too much intake of anything at one time. °· Dairy products until 24 to 48 hours after diarrhea stops. °· You may consume probiotics.  Probiotics are active cultures of beneficial bacteria. They may lessen the amount and number   of diarrheal stools in adults. Probiotics can be found in yogurt with active cultures and in supplements. °· Wash your hands well to avoid spreading the virus. °· Only take over-the-counter or prescription medicines for pain, discomfort, or fever as directed by your caregiver. Do not give aspirin to children. Antidiarrheal medicines are not recommended. °· Ask your caregiver if you should continue to take your regular prescribed and over-the-counter medicines. °· Keep all follow-up appointments as directed by your caregiver. °SEEK IMMEDIATE MEDICAL CARE IF:  °· You are unable to keep fluids down. °· You do not urinate at least once every 6 to 8 hours. °· You develop shortness of breath. °· You notice blood in your stool or vomit. This may look like coffee grounds. °· You have abdominal pain that increases or is concentrated in one small area (localized). °· You have persistent vomiting or diarrhea. °· You have a fever. °· The patient is a child younger than 3 months, and he or she has a fever. °· The patient is a child older than 3 months, and he or she has a fever and persistent symptoms. °· The patient is a child older than 3 months, and he or she has a fever and symptoms suddenly get worse. °· The patient is a baby, and he or she has no tears when crying. °MAKE SURE YOU:  °· Understand these instructions. °· Will watch your condition. °· Will get help right away if you are not doing well or get worse. °Document Released: 05/18/2005 Document Revised: 08/10/2011 Document Reviewed: 03/04/2011 °ExitCare® Patient Information ©2014 [employer]. ° °

## 2013-01-16 ENCOUNTER — Encounter: Payer: Self-pay | Admitting: Cardiology

## 2013-01-17 ENCOUNTER — Encounter: Payer: Self-pay | Admitting: Cardiology

## 2013-01-17 ENCOUNTER — Ambulatory Visit (INDEPENDENT_AMBULATORY_CARE_PROVIDER_SITE_OTHER): Payer: 59 | Admitting: Cardiology

## 2013-01-17 VITALS — BP 130/82 | HR 73 | Ht 65.0 in | Wt 191.0 lb

## 2013-01-17 DIAGNOSIS — R002 Palpitations: Secondary | ICD-10-CM

## 2013-01-17 DIAGNOSIS — I10 Essential (primary) hypertension: Secondary | ICD-10-CM

## 2013-01-17 DIAGNOSIS — E669 Obesity, unspecified: Secondary | ICD-10-CM

## 2013-01-17 DIAGNOSIS — R079 Chest pain, unspecified: Secondary | ICD-10-CM

## 2013-01-17 DIAGNOSIS — R0609 Other forms of dyspnea: Secondary | ICD-10-CM

## 2013-01-17 NOTE — Patient Instructions (Addendum)
You will be having a bike test -CPET   Your physician recommends that you schedule a follow-up appointment after test is completed.

## 2013-01-27 ENCOUNTER — Encounter: Payer: Self-pay | Admitting: Cardiology

## 2013-01-27 DIAGNOSIS — R079 Chest pain, unspecified: Secondary | ICD-10-CM | POA: Insufficient documentation

## 2013-01-27 DIAGNOSIS — E669 Obesity, unspecified: Secondary | ICD-10-CM | POA: Insufficient documentation

## 2013-01-27 DIAGNOSIS — R002 Palpitations: Secondary | ICD-10-CM | POA: Insufficient documentation

## 2013-01-27 DIAGNOSIS — R0609 Other forms of dyspnea: Secondary | ICD-10-CM | POA: Insufficient documentation

## 2013-01-27 NOTE — Assessment & Plan Note (Signed)
In the coronary disease from her last catheterization just 3 years ago, I doubt very seriously that she developed interval progression of disease.  She could however with her risk factors have small vessel disease.  Her symptoms could also simply just be musculoskeletal.  Plan: CPET-MET-TEST as a physiologic study to evaluate for ischemic dysfunction

## 2013-01-27 NOTE — Progress Notes (Signed)
ID: Angela Chambers, female   DOB: Apr 12, 1941, 72 y.o.   MRN: [id number] PCP: Angela Adolph, MD  Clinic Note: Chief Complaint  Patient presents with  . ROV 6 months    Chest pressure on exertion, arthritis pain when she walks, had 1 episode of dizziness-she took 2 tablets of Losartan instead of 1   HPI: Angela Chambers is a 72 y.o. female with a PMH below who presents today for six-month followup.  I last saw her back in March for Preoperative Risk Assessment.  She was otherwise doing relatively well.  Interval History: Today she says that overall from last few weeks she's noticed it when she walks briskly she feels it tightness in her chest and a little short of breath. It goes away upon stopping. It never occurs at rest. This is done this been off and on but over last week or so has been more significant. She doesn't notice palpitations. She has occasional vertigo when she holding would lie down her left side. Not walking. She denies any syncope or near-syncope. But, she did have one day when she felt extremely poor, dizzy nauseated clammy, et Karie Soda.  This was associated with the time that she took twice her normal dose of losartan.  She usually been taking 2 25 mg tablets daily because of the shortage in the pharmacy, however her last refill the pulses are actually 50 mg tablets.  She accidentally took 2 of these tablets which would correlate to 100 mg, and subsequent became hypotensive. Otherwise denies any TIA or amaurosis fugax symptoms. No melena, hematochezia or hematuria. No claudication symptoms. No true syncope or Near-syncope.  Past Medical History  Diagnosis Date  . Hyperlipidemia   . Diabetes mellitus   . Glaucoma   . Fibroids   . Arthritis   . Cataract   . Osteoporosis   . Palpitations     CONTROLLED ON BETA BLOCKER  . Hypertension 07 /2013     WITH EF GREATER THAN 55 % WITH MILD MR BUT NO PROLASPE ,ESSENTIALLY NORMAL DONE TO EVALUATE PALPITATIONS .  Marland Kitchen  Obesity (BMI 30.0-34.9)     Prior Cardiac Evaluation and Past Surgical History: Past Surgical History  Procedure Laterality Date  . Knee surgery  1984    per pt screws in R knee  . 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 Abdominal hysterectomy  10/18/ 1982    TAH-BSO for fibroids and adenomatous hyperplasia  . Tubal ligation    . 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 Persantine myoview  02/ 2011     SHOWED AN APICAL DEFECT BUT NO REVERSIBILITY THOUGHT TO BE DUE TO BREAST ATTENUATION .  Marland Kitchen Transthoracic echocardiogram  July 2013    EF greater than 55%, mild MR no prolapse.  Normal   Allergies  Allergen Reactions  . Lisinopril Cough   Current Outpatient Prescriptions  Medication Sig Dispense Refill  . aspirin 81 MG tablet Take 81 mg by mouth daily.      Marland Kitchen atorvastatin (LIPITOR) 20 MG tablet take 1 tablet by mouth once daily  30 tablet  11  . losartan (COZAAR) 25 MG tablet Take 2 tablets (50 mg total) by mouth daily.  180 tablet  3  . metFORMIN (GLUCOPHAGE) 500 MG tablet Take 1 tablet (500 mg total) by mouth 2 (two) times daily with a meal.  180  tablet  3  . metoprolol succinate (TOPROL-XL) 25 MG 24 hr tablet Take 1 tablet (25 mg total) by mouth daily.  30 tablet  11  . Multiple Vitamin (MULTIVITAMIN) tablet Take 1 tablet by mouth daily. Women's One a Day MVI      . Naproxen Sodium (ALEVE PO) Take 2 tablets by mouth 2 (two) times daily as needed.       . Travoprost, BAK Free, (TRAVATAN) 0.004 % SOLN ophthalmic solution Place 1 drop into both eyes at bedtime.  3 Bottle  1   No current facility-administered medications for this visit.   History   Social History Narrative   SHE IS MARRIED ,MOTHER OF 4, GRANDMOTHER OF 5 , WITH ONE MORE ON THE WAY. SHE DOES EXERCISE BUT NOW IS LIMITED BECAUSE OF HER KNEE.SHE DOES SOME WALKING BUT SIGNIFICANTLY  DECREASED BECAUSE OF HER KNEE PAIN.  BEFORE THAT SHE WAS VERY ACTIVE  AND GETTING ROUTINE WALKING IN .SHE QUIT SMOKING IN 1980 AND SHE DOES NOT DRINK   ROS: A comprehensive Review of Systems - Negative except Symptoms noted above.  PHYSICAL EXAM BP 130/82  Pulse 73  Ht 5\' 5"  (1.651 m)  Wt 191 lb (86.637 kg)  BMI 31.78 kg/m2 General: Very pleasant healthy-appearing AAF, NAD, A&Ox4; answers questions appropriately. Normal mood and affect. HEENT: NCAT, EOMI, MMM, anicteric sclerae.  Neck: Supple; no LAN,JVD, or carotid bruit.  Heart: RRR, normal S1, S2. No M/R/G. Nondisplaced PMI.  Lungs: CTAB, nonlabored,normal effort, good air movement.  Abdomen: Soft/NT/ND/NABS. No HSM. Mild obesity.  Extremities: No C/C/E, 2+ equal pulses throughout.   ONG:EXBMWUXLK today: Yes Rate: 73 , Rhythm: Normal sinus rhythm, normal ECG  Recent Labs: 11/24/2012; at 05/20/2013  Lipid panel: TC 176, TG 116, HDL 65, LDL 88  LFTs normal,  Temperature down notable for potassium 4.0, BUN/creatinine 11/0.75, glucose 126 (114 ON 8/12)  CBC essentially normal  ASSESSMENT / PLAN: Chest pain on exertion In the coronary disease from her last catheterization just 3 years ago, I doubt very seriously that she developed interval progression of disease.  She could however with her risk factors have small vessel disease.  Her symptoms could also simply just be musculoskeletal.  Plan: CPET-MET-TEST as a physiologic study to evaluate for ischemic dysfunction  DOE (dyspnea on exertion) To date her cardiac evaluation has been relatively normal.  She's had a negative stress test, normal echocardiogram as well as normal cath.  Plan: CPET-MET-TEST  Hypertension Relatively well controlled today, but borderline for diabetic.  She is on low-dose Toprol and moderate dose losartan.  Provided she remembers to take her medicines correctly, does not get confused with ~doses, she's been relatively stable.  Obesity (BMI 30.0-34.9) She is just barely above the threshold. Continue to recommend trying  to exercise more peripherally and make dietary modifications.  Palpitations Please continue dual controlled on beta blocker.  Continue current dose.   Orders Placed This Encounter  Procedures  . CPET ONLY (MET TEST)    Standing Status: Future     Number of Occurrences:      Standing Expiration Date: 01/17/2014  . EKG 12-Lead   No orders of the defined types were placed in this encounter.    Followup: ~1 month, after CPET  Jasmyn Picha W. Herbie Baltimore, M.D., M.S. [facility] [address] [place], [place]  [address]  [phone] Pager # [phone]

## 2013-01-27 NOTE — Assessment & Plan Note (Signed)
To date her cardiac evaluation has been relatively normal.  She's had a negative stress test, normal echocardiogram as well as normal cath.  Plan: CPET-MET-TEST

## 2013-01-27 NOTE — Assessment & Plan Note (Signed)
Relatively well controlled today, but borderline for diabetic.  She is on low-dose Toprol and moderate dose losartan.  Provided she remembers to take her medicines correctly, does not get confused with ~doses, she's been relatively stable.

## 2013-01-27 NOTE — Assessment & Plan Note (Signed)
She is just barely above the threshold. Continue to recommend trying to exercise more peripherally and make dietary modifications.

## 2013-01-27 NOTE — Assessment & Plan Note (Signed)
Please continue dual controlled on beta blocker.  Continue current dose.

## 2013-02-07 ENCOUNTER — Other Ambulatory Visit: Payer: Self-pay | Admitting: Cardiology

## 2013-02-08 NOTE — Telephone Encounter (Signed)
Rx was sent to pharmacy electronically. 

## 2013-02-10 ENCOUNTER — Encounter: Payer: Self-pay | Admitting: Family Medicine

## 2013-02-13 ENCOUNTER — Telehealth: Payer: Self-pay | Admitting: Cardiology

## 2013-02-13 NOTE — Telephone Encounter (Signed)
Called to r/s pt. Angela Chambers called in sick.

## 2013-02-20 ENCOUNTER — Ambulatory Visit: Payer: 59 | Admitting: Cardiology

## 2013-02-24 ENCOUNTER — Ambulatory Visit: Payer: Self-pay | Admitting: Cardiology

## 2013-02-24 ENCOUNTER — Encounter: Payer: Self-pay | Admitting: Cardiology

## 2013-03-06 DIAGNOSIS — R079 Chest pain, unspecified: Secondary | ICD-10-CM

## 2013-03-06 DIAGNOSIS — R0602 Shortness of breath: Secondary | ICD-10-CM

## 2013-03-06 HISTORY — PX: OTHER SURGICAL HISTORY: SHX169

## 2013-03-15 ENCOUNTER — Ambulatory Visit (INDEPENDENT_AMBULATORY_CARE_PROVIDER_SITE_OTHER): Payer: 59 | Admitting: Cardiology

## 2013-03-15 ENCOUNTER — Encounter: Payer: Self-pay | Admitting: Cardiology

## 2013-03-15 VITALS — BP 128/62 | HR 82 | Ht 65.0 in | Wt 196.1 lb

## 2013-03-15 DIAGNOSIS — I10 Essential (primary) hypertension: Secondary | ICD-10-CM

## 2013-03-15 DIAGNOSIS — R002 Palpitations: Secondary | ICD-10-CM

## 2013-03-15 DIAGNOSIS — E785 Hyperlipidemia, unspecified: Secondary | ICD-10-CM

## 2013-03-15 DIAGNOSIS — E669 Obesity, unspecified: Secondary | ICD-10-CM

## 2013-03-15 DIAGNOSIS — R079 Chest pain, unspecified: Secondary | ICD-10-CM

## 2013-03-15 NOTE — Progress Notes (Signed)
ID: [name], female   DOB: [birth year], 72 y.o.   MRN: [id number] PCP: Dow Adolph, MD  Clinic Note: Chief Complaint  Patient presents with  . Follow-up   HPI: Angela Chambers is a 72 y.o. female with a PMH below who presents today for 66-month followup. When I saw her in August, she had noted some chest discomfort with exertion. She also had an episode of dizziness when she had taken an extra dose or so of losartan. She was evaluated with a CPET- MET (cardiopulmonary exercise test) on October 6. She did relatively well and test which would be considered a low risk of finding, but there was some evidence of potential ischemic myocardial dysfunction at late exercise. See results below. We also clarified that she is taking a total of 50 mg of metoprolol succinate daily which has done well controlling her palpitations..  Interval History: Thankfully Ms. Pixley seems to be doing much better now. She is not noting any more this chest discomfort. She is now riding a bicycle more at home and getting more exercise. The knees are still bothering her especially when it rains. When she is riding a bike, she does not notice any chest discomfort or really any shortness of breath. As she's gotten in better shape she feels better. She denies any PND, orthopnea or edema. She also denies any rapid heartbeats or palpitations. No lightheadedness, dizziness or wooziness. A 6B/near syncope. No TIA or amaurosis fugax symptoms. She is been taking her medications correctly.  She denies any melena, hematochezia or hematuria. No nosebleeds. No claudication symptoms.  Past Medical History  Diagnosis Date  . Hyperlipidemia   . Diabetes mellitus   . Glaucoma   . Fibroids   . Arthritis   . Cataract   . Osteoporosis   . Palpitations     CONTROLLED ON BETA BLOCKER  . Hypertension 07 /2013     WITH EF GREATER THAN 55 % WITH MILD MR BUT NO PROLASPE ,ESSENTIALLY NORMAL DONE TO EVALUATE  PALPITATIONS .  Marland Kitchen Obesity (BMI 30.0-34.9)     Prior Cardiac Evaluation and Past Surgical History: Past Surgical History  Procedure Laterality Date  . Knee surgery  1984    per pt screws in R knee  . 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 Abdominal hysterectomy  10/18/ 1982    TAH-BSO for fibroids and adenomatous hyperplasia  . Tubal ligation    . 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 Persantine myoview  02/ 2011     SHOWED AN APICAL DEFECT BUT NO REVERSIBILITY THOUGHT TO BE DUE TO BREAST ATTENUATION .  Marland Kitchen Transthoracic echocardiogram  July 2013    EF greater than 55%, mild MR no prolapse.  Normal  . Cpet-met test (cardiopulmonary exercise test)  03/06/2013    Adequate effort at 1.11. Peak VO2 12.3 is 86% in the normal range. Heart rate was just outside the normal range target being 127 beats a minute but this was 86% of projected. She did have an ischemic pattern after the anterograde threshold with increased heart rate and blunting of stroke volume during the last 3 minutes of exercise, but was asymptomatic.   Allergies  Allergen Reactions  . Lisinopril Cough   Current Outpatient Prescriptions  Medication Sig Dispense Refill  . aspirin 81 MG tablet Take 81 mg by mouth daily.      Marland Kitchen  atorvastatin (LIPITOR) 20 MG tablet take 1 tablet by mouth once daily  30 tablet  11  . losartan (COZAAR) 50 MG tablet Take 1 tablet (50 mg total) by mouth daily.  30 tablet  5  . metFORMIN (GLUCOPHAGE) 500 MG tablet Take 1 tablet (500 mg total) by mouth 2 (two) times daily with a meal.  180 tablet  3  . metoprolol succinate (TOPROL-XL) 25 MG 24 hr tablet Take 1 tablet (25 mg total) by mouth daily.  30 tablet  11  . Multiple Vitamin (MULTIVITAMIN) tablet Take 1 tablet by mouth daily. Women's One a Day MVI      . Naproxen Sodium (ALEVE PO) Take 2 tablets by mouth 2 (two) times daily as needed.       . Travoprost, BAK Free,  (TRAVATAN) 0.004 % SOLN ophthalmic solution Place 1 drop into both eyes at bedtime.  3 Bottle  1  . ondansetron (ZOFRAN) 4 MG tablet 4 mg as needed.        No current facility-administered medications for this visit.   History   Social History Narrative   SHE IS MARRIED ,MOTHER OF 4, GRANDMOTHER OF 5 , WITH ONE MORE ON THE WAY. SHE DOES EXERCISE BUT NOW IS LIMITED BECAUSE OF HER KNEE.SHE DOES SOME WALKING BUT SIGNIFICANTLY  DECREASED BECAUSE OF HER KNEE PAIN.  BEFORE THAT SHE WAS VERY ACTIVE AND GETTING ROUTINE WALKING IN .SHE QUIT SMOKING IN 1980 AND SHE DOES NOT DRINK   ROS: A comprehensive Review of Systems - Negative except Symptoms noted above.  PHYSICAL EXAM BP 128/62  Pulse 82  Ht 5\' 5"  (1.651 m)  Wt 196 lb 1.6 oz (88.95 kg)  BMI 32.63 kg/m2 General: Very pleasant healthy-appearing AAF, NAD, A&Ox4; answers questions appropriately. Normal mood and affect. HEENT: NCAT, EOMI, MMM, anicteric sclerae.  Neck: Supple; no LAN,JVD, or carotid bruit.  Heart: RRR, normal S1, S2. No M/R/G. Nondisplaced PMI.  Lungs: CTAB, nonlabored,normal effort, good air movement.  Abdomen: Soft/NT/ND/NABS. No HSM. Mild obesity.  Extremities: No C/C/E, 2+ equal pulses throughout.   ZOX:[id number] today: No  Recent Labs: None recently  ASSESSMENT / PLAN: Chest pain on exertion She did relatively well on her CPET test read there was some evidence of ischemic dysfunction but late in exercise, and she was not symptomatic. Her chest discomfort is no longer present. She now has had a relatively stable physiologic study as well as a nuclear stress test and a cardiac catheterization all which showed no evidence of macrovascular coronary disease. Would continue to treat cardiac risk factors for possible microvascular disease based on the results of CPET. At 86% peak VO2, the goal would be to get into the 90s with a repeat test in 6-12 months. Unfortunately, we may not have access to the CPET  test.  Palpitations Essentially controlled on beta blocker.  Hypertension Better control today on beta blocker and ARB. More appropriate for diabetic.  Obesity (BMI 30.0-34.9) Mildly obese, but this does play a role in her overall cardiac risk. Truncal obesity in conjunction with diabetes and dyslipidemia pushes the border of medical syndrome. It does keep her high-risk. I talked her about sharing she monitors her diet along with her exercise. I recommended the DASH diet as a good model diet. She does continue to exercise which I recommended. The CPET results would recommend an exercise prescription.  Hyperlipidemia On statin plus niacin. Marked by PCP. Her last check was pretty well at goal for her risk factors.  No orders of the defined types were placed in this encounter.   Followup: ~1 year  Araminta Zorn W. Herbie Baltimore, M.D., M.S. [facility] [address] [place], [place]  [address]  [phone] Pager # [phone]

## 2013-03-15 NOTE — Patient Instructions (Signed)
DID WELL ON CPET TEST  If you have any surgery in the next 6 months you have clearance,if surgery occurs after 6 months you will have to come back for clearance  Continue with current treatment and medication  Continue watch your weight  Your physician wants you to follow-up in 12 months Dr Herbie Baltimore.  You will receive a reminder letter in the mail two months in advance. If you don't receive a letter, please call our office to schedule the follow-up appointment.

## 2013-03-17 ENCOUNTER — Encounter: Payer: Self-pay | Admitting: Cardiology

## 2013-03-17 NOTE — Assessment & Plan Note (Signed)
On statin plus niacin. Marked by PCP. Her last check was pretty well at goal for her risk factors.

## 2013-03-17 NOTE — Assessment & Plan Note (Signed)
Essentially controlled on beta blocker.

## 2013-03-17 NOTE — Assessment & Plan Note (Signed)
Better control today on beta blocker and ARB. More appropriate for diabetic.

## 2013-03-17 NOTE — Assessment & Plan Note (Signed)
She did relatively well on her CPET test read there was some evidence of ischemic dysfunction but late in exercise, and she was not symptomatic. Her chest discomfort is no longer present. She now has had a relatively stable physiologic study as well as a nuclear stress test and a cardiac catheterization all which showed no evidence of macrovascular coronary disease. Would continue to treat cardiac risk factors for possible microvascular disease based on the results of CPET. At 86% peak VO2, the goal would be to get into the 90s with a repeat test in 6-12 months. Unfortunately, we may not have access to the CPET test.

## 2013-03-17 NOTE — Assessment & Plan Note (Signed)
Mildly obese, but this does play a role in her overall cardiac risk. Truncal obesity in conjunction with diabetes and dyslipidemia pushes the border of medical syndrome. It does keep her high-risk. I talked her about sharing she monitors her diet along with her exercise. I recommended the DASH diet as a good model diet. She does continue to exercise which I recommended. The CPET results would recommend an exercise prescription.

## 2013-06-15 ENCOUNTER — Encounter: Payer: Self-pay | Admitting: Family Medicine

## 2013-06-15 ENCOUNTER — Ambulatory Visit (INDEPENDENT_AMBULATORY_CARE_PROVIDER_SITE_OTHER): Payer: 59 | Admitting: Family Medicine

## 2013-06-15 VITALS — BP 164/82 | HR 88 | Temp 98.1°F | Resp 16 | Ht 64.75 in | Wt 196.6 lb

## 2013-06-15 DIAGNOSIS — I10 Essential (primary) hypertension: Secondary | ICD-10-CM

## 2013-06-15 DIAGNOSIS — Z01818 Encounter for other preprocedural examination: Secondary | ICD-10-CM

## 2013-06-15 DIAGNOSIS — E119 Type 2 diabetes mellitus without complications: Secondary | ICD-10-CM

## 2013-06-15 LAB — POCT GLYCOSYLATED HEMOGLOBIN (HGB A1C): HEMOGLOBIN A1C: 6.6

## 2013-06-15 NOTE — Patient Instructions (Addendum)
I will fax the surgical clearance form back to the orthopedist's office. It is important that you hydrate well and maintain a healthy diet before surgery. Monitor your blood sugars. While in the hospital, you will be treated with Insulin. I expect you wil have no problems with the surgery. I will see you in 6 months.

## 2013-06-16 NOTE — Progress Notes (Signed)
Subjective:    Patient ID: Angela Chambers, female    DOB: 23-Jun-1940, 73 y.o.   MRN: [id number]  HPI  Thsi 73 y.o. AA female is here fro pre-operative clearance exam; TKR scheduled for April 2015 w/ Dr. Wynelle Link. Pt has well controlled HTN and has received cardiology clearance with Dr. Ellyn Hack in Oct 2014. CPET- MET performed 03/06/13 showed some evidence of ischemic dysfunction but nuclear stress test and cardiac cath showed no evidence of macrovascular CAD. She will follow-up w/ Dr. Ellyn Hack in Oct 2015.  She also has Type II DM, managed w/ Metformin, nutrition and exercise. Last A1c in June= 6.1%.  FSBS= 120-140s. She denies hypoglycemia. [place] trip to [place] is planned for early February 2015. Medications are well tolerated and pt's compliance is excellent.   Review of Systems  Constitutional: Negative.   HENT: Negative.   Eyes: Negative.   Respiratory: Negative for cough, chest tightness, shortness of breath and wheezing.   Cardiovascular: Negative.   Gastrointestinal: Negative for nausea, vomiting and abdominal pain.  Endocrine: Negative.   Musculoskeletal: Positive for arthralgias, gait problem and joint swelling.  Neurological: Negative.   Psychiatric/Behavioral: Negative.       Objective:   Physical Exam  Nursing note and vitals reviewed. Constitutional: She is oriented to person, place, and time. Vital signs are normal. She appears well-developed and well-nourished. No distress.  HENT:  Head: Normocephalic and atraumatic.  Right Ear: Hearing, tympanic membrane, external ear and ear canal normal.  Left Ear: Hearing, tympanic membrane, external ear and ear canal normal.  Nose: Nose normal. No nasal deformity or septal deviation.  Mouth/Throat: Uvula is midline, oropharynx is clear and moist and mucous membranes are normal. No oral lesions. Normal dentition.  Eyes: Conjunctivae, EOM and lids are normal. No scleral icterus.  Neck: Trachea normal and full passive  range of motion without pain. Neck supple. No JVD present. No spinous process tenderness and no muscular tenderness present. Carotid bruit is not present. No mass and no thyromegaly present.  Cardiovascular: Normal rate, regular rhythm, S1 normal, S2 normal, normal heart sounds and intact distal pulses.   No extrasystoles are present. PMI is not displaced.  Exam reveals no gallop and no friction rub.   No murmur heard. Pulmonary/Chest: Effort normal and breath sounds normal. No respiratory distress. She has no decreased breath sounds. She has no wheezes.  Abdominal: Soft. Normal appearance. She exhibits no distension. There is no hepatosplenomegaly. There is no tenderness. There is no guarding and no CVA tenderness.  Musculoskeletal:       Right knee: She exhibits normal range of motion, no effusion, no deformity and normal alignment. No tenderness found.       Left knee: She exhibits decreased range of motion, deformity and abnormal alignment. Tenderness found.  Neurological: She is alert and oriented to person, place, and time. She has normal strength. She displays no atrophy. No cranial nerve deficit or sensory deficit. She exhibits normal muscle tone. Gait abnormal. Coordination normal.  Skin: Skin is warm, dry and intact. No ecchymosis and no rash noted. She is not diaphoretic. No cyanosis or erythema. Nails show no clubbing.  Psychiatric: She has a normal mood and affect. Her speech is normal and behavior is normal. Judgment and thought content normal. Cognition and memory are normal.    Results for orders placed in visit on 06/15/13  POCT GLYCOSYLATED HEMOGLOBIN (HGB A1C)      Result Value Range   Hemoglobin A1C 6.6  Assessment & Plan:  Pre-operative general physical examination- Medical problems are stable and pt has received clearance from Cardiology.  Type II or unspecified type diabetes mellitus without mention of complication, not stated as uncontrolled - Well controlled;  discussed paln to management while hospitalized- pt received SS Insulin during hospitalization for R TKR. No problems anticipated. Plan: HM Diabetes Foot Exam, POCT glycosylated hemoglobin (Hb A1C)  Hypertension- Stable and well controlled on current medications.

## 2013-06-20 ENCOUNTER — Telehealth: Payer: Self-pay | Admitting: *Deleted

## 2013-06-20 NOTE — Telephone Encounter (Signed)
Message received in [place].  Patient Responses     Chl Mychart After Visit Questionnaire    Question 06/15/2013 3:54 PM   How are you feeling after your recent visit? VERY WELL   Does the recommended course of treatment seem to be helping your symptoms? YES   Are you experiencing any side effects from your recommended treatment? NO   is there anything else you would like to ask your physician? i AM SCHEDULED TO [place] 6, 2015 WITH DR. FRANK ALUISIO. DO I NEED TO GET A LETTER FROM YOU BEFORE SURGERY?   Message forwarded to Dr. Sharlyn Bologna, RN r/t clearance for R knee replacement surgery.

## 2013-06-20 NOTE — Telephone Encounter (Signed)
You do not need any additional evaluation for a Knee surgery.  Simply as your Orthopedic MD to send [place] a Request for Cardiac Perioperative Risk Assessment. We will complete & send back.  Leonie Man, MD

## 2013-07-25 ENCOUNTER — Other Ambulatory Visit: Payer: Self-pay | Admitting: Cardiology

## 2013-07-25 NOTE — Telephone Encounter (Signed)
Rx was sent to pharmacy electronically. 

## 2013-08-10 ENCOUNTER — Other Ambulatory Visit: Payer: Self-pay | Admitting: Orthopedic Surgery

## 2013-08-19 ENCOUNTER — Other Ambulatory Visit: Payer: Self-pay | Admitting: Physician Assistant

## 2013-08-30 ENCOUNTER — Telehealth: Payer: Self-pay

## 2013-08-30 NOTE — Telephone Encounter (Signed)
[facility] STATES THEY RECEIVED A CALL TODAY AND WAS OUT, DIDN'T KNOW WHAT IS WAS ABOUT. [place] [phone]

## 2013-08-31 ENCOUNTER — Other Ambulatory Visit: Payer: Self-pay | Admitting: Orthopedic Surgery

## 2013-08-31 NOTE — Telephone Encounter (Signed)
Spoke to billing- automated phone calls are starting to remind pt to schedule annual pe. Pt already has appt scheduled for 7/23. Just a reminder phone call.

## 2013-09-11 LAB — HM MAMMOGRAPHY

## 2013-09-12 ENCOUNTER — Encounter (HOSPITAL_COMMUNITY): Payer: Self-pay | Admitting: Pharmacy Technician

## 2013-09-15 ENCOUNTER — Other Ambulatory Visit (HOSPITAL_COMMUNITY): Payer: 59

## 2013-09-15 ENCOUNTER — Encounter: Payer: Self-pay | Admitting: *Deleted

## 2013-09-17 NOTE — H&P (Signed)
TOTAL KNEE ADMISSION H&P  Patient is being admitted for right total knee arthroplasty.  Subjective:  Chief Complaint:right knee pain.  HPI: Angela Chambers, 73 y.o. female, has a history of pain and functional disability in the right knee due to trauma and arthritis and has failed non-surgical conservative treatments for greater than 12 weeks to includeNSAID's and/or analgesics, corticosteriod injections, viscosupplementation injections and activity modification.  Onset of symptoms was gradual, starting 4 years ago with gradually worsening course since that time. The patient noted prior procedures on the knee to include  ORIF tibia on the right knee(s).  Patient currently rates pain in the right knee(s) at 7 out of 10 with activity. Patient has night pain, worsening of pain with activity and weight bearing, pain that interferes with activities of daily living, pain with passive range of motion, crepitus and joint swelling.  Patient has evidence of periarticular osteophytes and joint space narrowing by imaging studies. This patient has had proximal tibial fracture. There is no active infection.  Patient Active Problem List   Diagnosis Date Noted  . DOE (dyspnea on exertion) 01/27/2013  . Chest pain on exertion 01/27/2013  . Obesity (BMI 30.0-34.9)   . Palpitations   . Hypertension 11/30/2011  . Hyperlipidemia 08/25/2011  . DIABETES MELLITUS- TYPE II 02/22/2009  . IRON DEFICIENCY 02/22/2009  . PERSONAL HISTORY OF COLONIC POLYPS 02/22/2009   Past Medical History  Diagnosis Date  . Hyperlipidemia   . Diabetes mellitus   . Glaucoma   . Fibroids   . Arthritis   . Cataract   . Osteoporosis   . Palpitations     CONTROLLED ON BETA BLOCKER  . Hypertension 07 /2013     WITH EF GREATER THAN 55 % WITH MILD MR BUT NO PROLASPE ,ESSENTIALLY NORMAL DONE TO EVALUATE PALPITATIONS .  Marland Kitchen Obesity (BMI 30.0-34.9)     Past Surgical History  Procedure Laterality Date  . Knee surgery  1984    per  pt screws in R knee  . 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 Abdominal hysterectomy  10/18/ 1982    TAH-BSO for fibroids and adenomatous hyperplasia  . Tubal ligation    . 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 Persantine myoview  02/ 2011     SHOWED AN APICAL DEFECT BUT NO REVERSIBILITY THOUGHT TO BE DUE TO BREAST ATTENUATION .  Marland Kitchen Transthoracic echocardiogram  July 2013    EF greater than 55%, mild MR no prolapse.  Normal  . Cpet-met test (cardiopulmonary exercise test)  03/06/2013    Adequate effort at 1.11. Peak VO2 12.3 is 86% in the normal range. Heart rate was just outside the normal range target being 127 beats a minute but this was 86% of projected. She did have an ischemic pattern after the anterograde threshold with increased heart rate and blunting of stroke volume during the last 3 minutes of exercise, but was asymptomatic.    No prescriptions prior to admission   Allergies  Allergen Reactions  . Lisinopril Cough    History  Substance Use Topics  . Smoking status: Former Smoker -- 0.50 packs/day for 20 years    Types: Cigarettes    Quit date: 07/13/1981  . Smokeless tobacco: Never Used  . Alcohol Use: No    Family History  Problem Relation Age of Onset  . Diabetes Mother   .  Heart disease Mother   . Diabetes Sister   . Hyperlipidemia Sister   . Diabetes Brother      Review of Systems  Constitutional: Positive for diaphoresis. Negative for fever, chills, weight loss and malaise/fatigue.  HENT: Negative.   Eyes: Negative.   Respiratory: Negative.   Cardiovascular: Negative.   Gastrointestinal: Negative.   Genitourinary: Negative.   Musculoskeletal: Positive for joint pain. Negative for back pain, falls, myalgias and neck pain.       Right knee pain  Skin: Negative.   Neurological: Negative.  Negative for weakness.  Endo/Heme/Allergies: Negative.   Psychiatric/Behavioral: Negative.      Objective:  Physical Exam  Constitutional: She is oriented to person, place, and time. She appears well-developed and well-nourished. No distress.  HENT:  Head: Normocephalic and atraumatic.  Right Ear: External ear normal.  Left Ear: External ear normal.  Nose: Nose normal.  Mouth/Throat: Oropharynx is clear and moist.  Eyes: Conjunctivae and EOM are normal.  Neck: Normal range of motion. Neck supple.  Cardiovascular: Normal rate, regular rhythm, normal heart sounds and intact distal pulses.   No murmur heard. Respiratory: Effort normal and breath sounds normal. No respiratory distress. She has no wheezes.  GI: Soft. Bowel sounds are normal. She exhibits no distension. There is no tenderness.  Musculoskeletal:       Right hip: Normal.       Left hip: Normal.       Right knee: She exhibits decreased range of motion, swelling and effusion. She exhibits no erythema. Tenderness found. Medial joint line and lateral joint line tenderness noted.       Left knee: Normal.       Right lower leg: She exhibits no tenderness and no swelling.       Left lower leg: She exhibits no tenderness and no swelling.  Her right knee shows no effusion. Her right hip has normal range of motion with no discomfort. She has slight valgus in the right knee. Her range of motion is 5-125 degrees. There is moderate crepitus on range of motion. She is tender lateral greater than medial with no instability noted.  Neurological: She is alert and oriented to person, place, and time. She has normal strength and normal reflexes. No sensory deficit.  Skin: She is not diaphoretic. No erythema.  Psychiatric: She has a normal mood and affect. Her behavior is normal.    Vitals Weight: 197 lb Height: 65 in Body Surface Area: 2.02 m Body Mass Index: 32.78 kg/m Pulse: 76 (Regular) BP: 144/78 (Sitting, Left Arm, Standard)  Imaging Review Plain radiographs demonstrate severe degenerative joint disease of the  right knee(s). The overall alignment ismild valgus. The bone quality appears to be good for age and reported activity level.  Assessment/Plan:  End stage arthritis, right knee   The patient history, physical examination, clinical judgment of the provider and imaging studies are consistent with end stage degenerative joint disease of the right knee(s) and total knee arthroplasty is deemed medically necessary. The treatment options including medical management, injection therapy arthroscopy and arthroplasty were discussed at length. The risks and benefits of total knee arthroplasty were presented and reviewed. The risks due to aseptic loosening, infection, stiffness, patella tracking problems, thromboembolic complications and other imponderables were discussed. The patient acknowledged the explanation, agreed to proceed with the plan and consent was signed. Patient is being admitted for inpatient treatment for surgery, pain control, PT, OT, prophylactic antibiotics, VTE prophylaxis, progressive ambulation and ADL's and discharge planning.  The patient is planning to be discharged home with home health services

## 2013-09-18 NOTE — Patient Instructions (Signed)
20     Your procedure is scheduled on:  Monday 09/25/2013  Report to [facility] at  0730 AM.  Call this number if you have problems the night before or morning of surgery:  [phone]   Remember:             IF YOU USE CPAP,BRING MASK AND TUBING AM OF SURGERY!             IF YOU DO NOT HAVE YOUR TYPE AND SCREEN DRAWN AT PRE-ADMIT APPOINTMENT, YOU WILL HAVE IT DRAWN AM OF SURGERY!   Do not eat food or drink liquids AFTER MIDNIGHT!  Take these medicines the morning of surgery with A SIP OF WATER: Metoprolol    [facility] IS NOT RESPONSIBLE FOR ANY BELONGINGS OR VALUABLES BROUGHT TO HOSPITAL.  Marland Kitchen  Leave suitcase in the car. After surgery it may be brought to your room.  For patients admitted to the hospital, checkout time is 11:00 AM the day of              Discharge.    DO NOT WEAR   JEWELRY,MAKE-UP,LOTIONS,POWDERS,PERFUMES,CONTACTS , DENTURES OR BRIDGEWORK ,AND DO NOT WEAR FALSE EYELASHES                                    Patients discharged the day of surgery will not be allowed to drive home.    If going home the same day of surgery, must have someone stay with you  first 24 hrs.at home and arrange for someone to drive you home from the [place]: N/A   Special Instructions:              Please read over the following fact sheets that you were given:             1. [place].Tobin Chad     [phone]                              [facility] - Preparing for Surgery Before surgery, you can play an important role.  Because skin is not sterile, your skin needs to be as free of germs as possible.  You can reduce the number of germs on your skin by washing with CHG (chlorahexidine gluconate) soap before surgery.  CHG is an antiseptic cleaner which kills germs and bonds with the skin to continue killing germs  even after washing. Please DO NOT use if you have an allergy to CHG or antibacterial soaps.  If your skin becomes reddened/irritated stop using the CHG and inform your nurse when you arrive at Short Stay. Do not shave (including legs and underarms) for at least 48 hours prior to the first CHG shower.  You may shave your face. Please follow these instructions carefully:  1.  Shower with CHG Soap the night before surgery  and the  morning of Surgery.  2.  If you choose to wash your hair, wash your hair first as usual with your  normal  shampoo.  3.  After you shampoo, rinse your hair and body thoroughly to remove the  shampoo.                           4.  Use CHG as you would any other liquid soap.  You can apply chg directly  to the skin and wash                       Gently with a scrungie or clean washcloth.  5.  Apply the CHG Soap to your body ONLY FROM THE NECK DOWN.   Do not use on open                           Wound or open sores. Avoid contact with eyes, ears mouth and genitals (private parts).                        Genitals (private parts) with your normal soap.             6.  Wash thoroughly, paying special attention to the area where your surgery  will be performed.  7.  Thoroughly rinse your body with warm water from the neck down.  8.  DO NOT shower/wash with your normal soap after using and rinsing off  the CHG Soap.                9.  Pat yourself dry with a clean towel.            10.  Wear clean pajamas.            11.  Place clean sheets on your bed the night of your first shower and do not  sleep with pets. Day of Surgery : Do not apply any lotions/deodorants the morning of surgery.  Please wear clean clothes to the hospital/surgery center.  FAILURE TO FOLLOW THESE INSTRUCTIONS MAY RESULT IN THE CANCELLATION OF YOUR SURGERY PATIENT SIGNATURE_________________________________  NURSE SIGNATURE__________________________________   Incentive Spirometer  An incentive  spirometer is a tool that can help keep your lungs clear and active. This tool measures how well you are filling your lungs with each breath. Taking long deep breaths may help reverse or decrease the chance of developing breathing (pulmonary) problems (especially infection) following:  A long period of time when you are unable to move or be active. BEFORE THE PROCEDURE   If the spirometer includes an indicator to show your best effort, your nurse or respiratory therapist will set it to a desired goal.  If possible, sit up straight or lean slightly forward. Try not to slouch.  Hold the incentive spirometer in an upright position. INSTRUCTIONS FOR USE  1. Sit on the edge of your bed if possible, or sit up as far as you can in bed or on a chair. 2. Hold the incentive spirometer in an upright position. 3. Breathe out normally. 4. Place the mouthpiece in your mouth and seal your lips tightly around it. 5. Breathe in slowly and as deeply as possible, raising the piston or the ball toward the top of the column. 6. Hold your breath for 3-5 seconds or for as long as possible. Allow the  piston or ball to fall to the bottom of the column. 7. Remove the mouthpiece from your mouth and breathe out normally. 8. Rest for a few seconds and repeat Steps 1 through 7 at least 10 times every 1-2 hours when you are awake. Take your time and take a few normal breaths between deep breaths. 9. The spirometer may include an indicator to show your best effort. Use the indicator as a goal to work toward during each repetition. 10. After each set of 10 deep breaths, practice coughing to be sure your lungs are clear. If you have an incision (the cut made at the time of surgery), support your incision when coughing by placing a pillow or rolled up towels firmly against it. Once you are able to get out of bed, walk around indoors and cough well. You may stop using the incentive spirometer when instructed by your caregiver.   RISKS AND COMPLICATIONS  Take your time so you do not get dizzy or light-headed.  If you are in pain, you may need to take or ask for pain medication before doing incentive spirometry. It is harder to take a deep breath if you are having pain. AFTER USE  Rest and breathe slowly and easily.  It can be helpful to keep track of a log of your progress. Your caregiver can provide you with a simple table to help with this. If you are using the spirometer at home, follow these instructions: [place] IF:   You are having difficultly using the spirometer.  You have trouble using the spirometer as often as instructed.  Your pain medication is not giving enough relief while using the spirometer.  You develop fever of 100.5 F (38.1 C) or higher. SEEK IMMEDIATE MEDICAL CARE IF:   You cough up bloody sputum that had not been present before.  You develop fever of 102 F (38.9 C) or greater.  You develop worsening pain at or near the incision site. MAKE SURE YOU:   Understand these instructions.  Will watch your condition.  Will get help right away if you are not doing well or get worse. Document Released: 09/28/2006 Document Revised: 08/10/2011 Document Reviewed: 11/29/2006 [facility] Patient Information 2014 [place], [place].   WHAT IS A BLOOD TRANSFUSION? Blood Transfusion Information  A transfusion is the replacement of blood or some of its parts. Blood is made up of multiple cells which provide different functions.  Red blood cells carry oxygen and are used for blood loss replacement.  White blood cells fight against infection.  Platelets control bleeding.  Plasma helps clot blood.  Other blood products are available for specialized needs, such as hemophilia or other clotting disorders. BEFORE THE TRANSFUSION  Who gives blood for transfusions?   Healthy volunteers who are fully evaluated to make sure their blood is safe. This is blood bank blood. Transfusion  therapy is the safest it has ever been in the practice of medicine. Before blood is taken from a donor, a complete history is taken to make sure that person has no history of diseases nor engages in risky social behavior (examples are intravenous drug use or sexual activity with multiple partners). The donor's travel history is screened to minimize risk of transmitting infections, such as malaria. The donated blood is tested for signs of infectious diseases, such as HIV and hepatitis. The blood is then tested to be sure it is compatible with you in order to minimize the chance of a transfusion reaction. If you or  a relative donates blood, this is often done in anticipation of surgery and is not appropriate for emergency situations. It takes many days to process the donated blood. RISKS AND COMPLICATIONS Although transfusion therapy is very safe and saves many lives, the main dangers of transfusion include:   Getting an infectious disease.  Developing a transfusion reaction. This is an allergic reaction to something in the blood you were given. Every precaution is taken to prevent this. The decision to have a blood transfusion has been considered carefully by your caregiver before blood is given. Blood is not given unless the benefits outweigh the risks. AFTER THE TRANSFUSION  Right after receiving a blood transfusion, you will usually feel much better and more energetic. This is especially true if your red blood cells have gotten low (anemic). The transfusion raises the level of the red blood cells which carry oxygen, and this usually causes an energy increase.  The nurse administering the transfusion will monitor you carefully for complications. HOME CARE INSTRUCTIONS  No special instructions are needed after a transfusion. You may find your energy is better. Speak with your caregiver about any limitations on activity for underlying diseases you may have. SEEK MEDICAL CARE IF:   Your condition is  not improving after your transfusion.  You develop redness or irritation at the intravenous (IV) site. SEEK IMMEDIATE MEDICAL CARE IF:  Any of the following symptoms occur over the next 12 hours:  Shaking chills.  You have a temperature by mouth above 102 F (38.9 C), not controlled by medicine.  Chest, back, or muscle pain.  People around you feel you are not acting correctly or are confused.  Shortness of breath or difficulty breathing.  Dizziness and fainting.  You get a rash or develop hives.  You have a decrease in urine output.  Your urine turns a dark color or changes to pink, red, or brown. Any of the following symptoms occur over the next 10 days:  You have a temperature by mouth above 102 F (38.9 C), not controlled by medicine.  Shortness of breath.  Weakness after normal activity.  The white part of the eye turns yellow (jaundice).  You have a decrease in the amount of urine or are urinating less often.  Your urine turns a dark color or changes to pink, red, or brown. Document Released: 05/15/2000 Document Revised: 08/10/2011 Document Reviewed: 01/02/2008 [facility] Patient Information 2014 [place].

## 2013-09-19 ENCOUNTER — Encounter (HOSPITAL_COMMUNITY): Payer: Self-pay

## 2013-09-19 ENCOUNTER — Ambulatory Visit (HOSPITAL_COMMUNITY)
Admission: RE | Admit: 2013-09-19 | Discharge: 2013-09-19 | Disposition: A | Discharge status: Home / Self Care | Payer: Medicare Other | Source: Ambulatory Visit | Attending: Anesthesiology | Admitting: Anesthesiology

## 2013-09-19 ENCOUNTER — Encounter: Payer: Self-pay | Admitting: *Deleted

## 2013-09-19 ENCOUNTER — Encounter (HOSPITAL_COMMUNITY)
Admission: RE | Admit: 2013-09-19 | Discharge: 2013-09-19 | Disposition: A | Discharge status: Home / Self Care | Payer: Medicare Other | Source: Ambulatory Visit | Attending: Orthopedic Surgery | Admitting: Orthopedic Surgery

## 2013-09-19 DIAGNOSIS — Z01818 Encounter for other preprocedural examination: Secondary | ICD-10-CM | POA: Insufficient documentation

## 2013-09-19 DIAGNOSIS — Z01812 Encounter for preprocedural laboratory examination: Secondary | ICD-10-CM | POA: Insufficient documentation

## 2013-09-19 LAB — URINE MICROSCOPIC-ADD ON

## 2013-09-19 LAB — APTT: APTT: 31 s (ref 24–37)

## 2013-09-19 LAB — URINALYSIS, ROUTINE W REFLEX MICROSCOPIC
Bilirubin Urine: NEGATIVE
Glucose, UA: NEGATIVE mg/dL
Hgb urine dipstick: NEGATIVE
KETONES UR: NEGATIVE mg/dL
Nitrite: NEGATIVE
PH: 7 (ref 5.0–8.0)
Protein, ur: NEGATIVE mg/dL
Specific Gravity, Urine: 1.022 (ref 1.005–1.030)
UROBILINOGEN UA: 0.2 mg/dL (ref 0.0–1.0)

## 2013-09-19 LAB — CBC
HEMATOCRIT: 36.6 % (ref 36.0–46.0)
Hemoglobin: 12.9 g/dL (ref 12.0–15.0)
MCH: 28.8 pg (ref 26.0–34.0)
MCHC: 35.2 g/dL (ref 30.0–36.0)
MCV: 81.7 fL (ref 78.0–100.0)
Platelets: 317 10*3/uL (ref 150–400)
RBC: 4.48 MIL/uL (ref 3.87–5.11)
RDW: 14.9 % (ref 11.5–15.5)
WBC: 4.8 10*3/uL (ref 4.0–10.5)

## 2013-09-19 LAB — COMPREHENSIVE METABOLIC PANEL
ALBUMIN: 3.9 g/dL (ref 3.5–5.2)
ALK PHOS: 99 U/L (ref 39–117)
ALT: 13 U/L (ref 0–35)
AST: 14 U/L (ref 0–37)
BUN: 15 mg/dL (ref 6–23)
CO2: 29 mEq/L (ref 19–32)
Calcium: 9.8 mg/dL (ref 8.4–10.5)
Chloride: 101 mEq/L (ref 96–112)
Creatinine, Ser: 0.77 mg/dL (ref 0.50–1.10)
GFR calc Af Amer: >90 mL/min (ref >90)
GFR calc non Af Amer: 81 mL/min — ABNORMAL LOW (ref >90)
Glucose, Bld: 146 mg/dL — ABNORMAL HIGH (ref 70–99)
POTASSIUM: 3.9 meq/L (ref 3.7–5.3)
Sodium: 141 mEq/L (ref 137–147)
TOTAL PROTEIN: 6.9 g/dL (ref 6.0–8.3)
Total Bilirubin: 0.6 mg/dL (ref 0.3–1.2)

## 2013-09-19 LAB — SURGICAL PCR SCREEN
MRSA, PCR: NEGATIVE
Staphylococcus aureus: POSITIVE — AB

## 2013-09-19 LAB — PROTIME-INR
INR: 0.98 (ref 0.00–1.49)
Prothrombin Time: 12.8 seconds (ref 11.6–15.2)

## 2013-09-25 ENCOUNTER — Encounter (HOSPITAL_COMMUNITY): Payer: Self-pay | Admitting: *Deleted

## 2013-09-25 ENCOUNTER — Inpatient Hospital Stay (HOSPITAL_COMMUNITY)
Admission: RE | Admit: 2013-09-25 | Discharge: 2013-09-28 | DRG: 470 | Disposition: A | Discharge status: Home Health | Payer: Medicare Other | Source: Ambulatory Visit | Attending: Orthopedic Surgery | Admitting: Orthopedic Surgery

## 2013-09-25 ENCOUNTER — Encounter (HOSPITAL_COMMUNITY)
Admission: RE | Disposition: A | Discharge status: Home Health | Payer: Self-pay | Source: Ambulatory Visit | Attending: Orthopedic Surgery

## 2013-09-25 ENCOUNTER — Inpatient Hospital Stay (HOSPITAL_COMMUNITY): Payer: Medicare Other | Admitting: Anesthesiology

## 2013-09-25 ENCOUNTER — Encounter (HOSPITAL_COMMUNITY): Payer: Medicare Other | Admitting: Anesthesiology

## 2013-09-25 DIAGNOSIS — Z888 Allergy status to other drugs, medicaments and biological substances status: Secondary | ICD-10-CM

## 2013-09-25 DIAGNOSIS — E871 Hypo-osmolality and hyponatremia: Secondary | ICD-10-CM

## 2013-09-25 DIAGNOSIS — H409 Unspecified glaucoma: Secondary | ICD-10-CM | POA: Diagnosis present

## 2013-09-25 DIAGNOSIS — Z6833 Body mass index (BMI) 33.0-33.9, adult: Secondary | ICD-10-CM

## 2013-09-25 DIAGNOSIS — M179 Osteoarthritis of knee, unspecified: Secondary | ICD-10-CM

## 2013-09-25 DIAGNOSIS — D62 Acute posthemorrhagic anemia: Secondary | ICD-10-CM

## 2013-09-25 DIAGNOSIS — E66811 Obesity, class 1: Secondary | ICD-10-CM

## 2013-09-25 DIAGNOSIS — Z8249 Family history of ischemic heart disease and other diseases of the circulatory system: Secondary | ICD-10-CM

## 2013-09-25 DIAGNOSIS — Z96651 Presence of right artificial knee joint: Secondary | ICD-10-CM

## 2013-09-25 DIAGNOSIS — E669 Obesity, unspecified: Secondary | ICD-10-CM | POA: Diagnosis present

## 2013-09-25 DIAGNOSIS — M1710 Unilateral primary osteoarthritis, unspecified knee: Principal | ICD-10-CM | POA: Diagnosis present

## 2013-09-25 DIAGNOSIS — Z833 Family history of diabetes mellitus: Secondary | ICD-10-CM

## 2013-09-25 DIAGNOSIS — I10 Essential (primary) hypertension: Secondary | ICD-10-CM

## 2013-09-25 DIAGNOSIS — M898X9 Other specified disorders of bone, unspecified site: Secondary | ICD-10-CM | POA: Diagnosis present

## 2013-09-25 DIAGNOSIS — Z87891 Personal history of nicotine dependence: Secondary | ICD-10-CM

## 2013-09-25 DIAGNOSIS — M810 Age-related osteoporosis without current pathological fracture: Secondary | ICD-10-CM | POA: Diagnosis present

## 2013-09-25 DIAGNOSIS — E785 Hyperlipidemia, unspecified: Secondary | ICD-10-CM

## 2013-09-25 DIAGNOSIS — Z96659 Presence of unspecified artificial knee joint: Secondary | ICD-10-CM

## 2013-09-25 DIAGNOSIS — I2510 Atherosclerotic heart disease of native coronary artery without angina pectoris: Secondary | ICD-10-CM | POA: Diagnosis present

## 2013-09-25 DIAGNOSIS — E119 Type 2 diabetes mellitus without complications: Secondary | ICD-10-CM

## 2013-09-25 HISTORY — PX: TOTAL KNEE ARTHROPLASTY: SHX125

## 2013-09-25 LAB — TYPE AND SCREEN
ABO/RH(D): O POS
Antibody Screen: NEGATIVE

## 2013-09-25 LAB — GLUCOSE, CAPILLARY
Glucose-Capillary: 139 mg/dL — ABNORMAL HIGH (ref 70–99)
Glucose-Capillary: 113 mg/dL — ABNORMAL HIGH (ref 70–99)
Glucose-Capillary: 108 mg/dL — ABNORMAL HIGH (ref 70–99)

## 2013-09-25 SURGERY — ARTHROPLASTY, KNEE, TOTAL
Anesthesia: Spinal | Site: Knee | Laterality: Right

## 2013-09-25 MED ORDER — FLEET ENEMA 7-19 GM/118ML RE ENEM: 1.0000 | ENEMA | Freq: Once | RECTAL | Status: AC | PRN

## 2013-09-25 MED ORDER — HYDROMORPHONE HCL PF 1 MG/ML IJ SOLN: 0.2500 mg | INTRAMUSCULAR | Status: DC | PRN

## 2013-09-25 MED ORDER — METOPROLOL SUCCINATE ER 25 MG PO TB24
25.0000 mg | ORAL_TABLET | Freq: Every morning | ORAL | Status: DC
  Administered 2013-09-26 – 2013-09-28 (×3): 25 mg via ORAL
  Filled 2013-09-25 (×3): qty 1

## 2013-09-25 MED ORDER — BUPIVACAINE IN DEXTROSE 0.75-8.25 % IT SOLN
INTRATHECAL | Status: DC | PRN
  Administered 2013-09-25: 1.8 mL via INTRATHECAL

## 2013-09-25 MED ORDER — ATORVASTATIN CALCIUM 20 MG PO TABS
20.0000 mg | ORAL_TABLET | Freq: Every day | ORAL | Status: DC
  Administered 2013-09-26 – 2013-09-28 (×3): 20 mg via ORAL
  Filled 2013-09-25 (×3): qty 1

## 2013-09-25 MED ORDER — LACTATED RINGERS IV SOLN
INTRAVENOUS | Status: DC | PRN
  Administered 2013-09-25 (×3): via INTRAVENOUS

## 2013-09-25 MED ORDER — ACETAMINOPHEN 650 MG RE SUPP: 650.0000 mg | Freq: Four times a day (QID) | RECTAL | Status: DC | PRN

## 2013-09-25 MED ORDER — MORPHINE SULFATE 2 MG/ML IJ SOLN
1.0000 mg | INTRAMUSCULAR | Status: DC | PRN
  Administered 2013-09-25 (×2): 2 mg via INTRAVENOUS
  Filled 2013-09-25 (×3): qty 1

## 2013-09-25 MED ORDER — BUPIVACAINE HCL 0.25 % IJ SOLN
INTRAMUSCULAR | Status: DC | PRN
  Administered 2013-09-25: 20 mL

## 2013-09-25 MED ORDER — PROPOFOL 10 MG/ML IV BOLUS
INTRAVENOUS | Status: DC | PRN
Start: 2013-09-25 — End: 2013-09-25
  Administered 2013-09-25: 10 mg via INTRAVENOUS

## 2013-09-25 MED ORDER — DEXAMETHASONE SODIUM PHOSPHATE 10 MG/ML IJ SOLN
10.0000 mg | Freq: Once | INTRAMUSCULAR | Status: AC
  Administered 2013-09-25: 10 mg via INTRAVENOUS

## 2013-09-25 MED ORDER — ONDANSETRON HCL 4 MG PO TABS: 4.0000 mg | ORAL_TABLET | Freq: Four times a day (QID) | ORAL | Status: DC | PRN

## 2013-09-25 MED ORDER — BUPIVACAINE LIPOSOME 1.3 % IJ SUSP
20.0000 mL | Freq: Once | INTRAMUSCULAR | Status: DC
Start: 2013-09-25 — End: 2013-09-25
  Filled 2013-09-25: qty 20

## 2013-09-25 MED ORDER — METHOCARBAMOL 500 MG PO TABS
500.0000 mg | ORAL_TABLET | Freq: Four times a day (QID) | ORAL | Status: DC | PRN
Start: 2013-09-25 — End: 2013-09-28
  Administered 2013-09-25 – 2013-09-27 (×5): 500 mg via ORAL
  Filled 2013-09-25 (×5): qty 1

## 2013-09-25 MED ORDER — CEFAZOLIN SODIUM-DEXTROSE 2-3 GM-% IV SOLR
2.0000 g | Freq: Four times a day (QID) | INTRAVENOUS | Status: AC
  Administered 2013-09-25 (×2): 2 g via INTRAVENOUS
  Filled 2013-09-25 (×2): qty 50

## 2013-09-25 MED ORDER — DEXAMETHASONE SODIUM PHOSPHATE 10 MG/ML IJ SOLN
10.0000 mg | Freq: Every day | INTRAMUSCULAR | Status: AC
  Filled 2013-09-25: qty 1

## 2013-09-25 MED ORDER — ACETAMINOPHEN 10 MG/ML IV SOLN
INTRAVENOUS | Status: DC | PRN
  Administered 2013-09-25: 1000 mg via INTRAVENOUS

## 2013-09-25 MED ORDER — ACETAMINOPHEN 325 MG PO TABS
650.0000 mg | ORAL_TABLET | Freq: Four times a day (QID) | ORAL | Status: DC | PRN
  Administered 2013-09-27: 650 mg via ORAL
  Filled 2013-09-25: qty 2

## 2013-09-25 MED ORDER — CEFAZOLIN SODIUM-DEXTROSE 2-3 GM-% IV SOLR
2.0000 g | INTRAVENOUS | Status: AC
  Administered 2013-09-25: 2 g via INTRAVENOUS

## 2013-09-25 MED ORDER — METHOCARBAMOL 100 MG/ML IJ SOLN
500.0000 mg | Freq: Four times a day (QID) | INTRAVENOUS | Status: DC | PRN
  Filled 2013-09-25: qty 5

## 2013-09-25 MED ORDER — PROPOFOL 10 MG/ML IV BOLUS
INTRAVENOUS | Status: AC
  Filled 2013-09-25: qty 20

## 2013-09-25 MED ORDER — OXYCODONE HCL 5 MG PO TABS
5.0000 mg | ORAL_TABLET | ORAL | Status: DC | PRN
  Administered 2013-09-25 – 2013-09-26 (×3): 10 mg via ORAL
  Administered 2013-09-27 – 2013-09-28 (×4): 5 mg via ORAL
  Filled 2013-09-25: qty 1
  Filled 2013-09-25: qty 2
  Filled 2013-09-25 (×3): qty 1
  Filled 2013-09-25 (×2): qty 2

## 2013-09-25 MED ORDER — DOCUSATE SODIUM 100 MG PO CAPS
100.0000 mg | ORAL_CAPSULE | Freq: Two times a day (BID) | ORAL | Status: DC
  Administered 2013-09-25 – 2013-09-28 (×6): 100 mg via ORAL

## 2013-09-25 MED ORDER — DIPHENHYDRAMINE HCL 12.5 MG/5ML PO ELIX: 12.5000 mg | ORAL_SOLUTION | ORAL | Status: DC | PRN

## 2013-09-25 MED ORDER — PROMETHAZINE HCL 25 MG/ML IJ SOLN: 6.2500 mg | INTRAMUSCULAR | Status: DC | PRN

## 2013-09-25 MED ORDER — CEFAZOLIN SODIUM-DEXTROSE 2-3 GM-% IV SOLR
INTRAVENOUS | Status: AC
  Filled 2013-09-25: qty 50

## 2013-09-25 MED ORDER — METFORMIN HCL 500 MG PO TABS
500.0000 mg | ORAL_TABLET | Freq: Two times a day (BID) | ORAL | Status: DC
  Filled 2013-09-25 (×3): qty 1

## 2013-09-25 MED ORDER — PROPOFOL INFUSION 10 MG/ML OPTIME
INTRAVENOUS | Status: DC | PRN
  Administered 2013-09-25: 75 ug/kg/min via INTRAVENOUS

## 2013-09-25 MED ORDER — KETOROLAC TROMETHAMINE 15 MG/ML IJ SOLN
7.5000 mg | Freq: Four times a day (QID) | INTRAMUSCULAR | Status: AC | PRN
Start: 2013-09-25 — End: 2013-09-26
  Administered 2013-09-26: 7.5 mg via INTRAVENOUS
  Filled 2013-09-25: qty 1

## 2013-09-25 MED ORDER — BISACODYL 10 MG RE SUPP: 10.0000 mg | Freq: Every day | RECTAL | Status: DC | PRN

## 2013-09-25 MED ORDER — MIDAZOLAM HCL 5 MG/5ML IJ SOLN
INTRAMUSCULAR | Status: DC | PRN
  Administered 2013-09-25: 2 mg via INTRAVENOUS

## 2013-09-25 MED ORDER — SODIUM CHLORIDE 0.9 % IJ SOLN
INTRAMUSCULAR | Status: AC
  Filled 2013-09-25: qty 50

## 2013-09-25 MED ORDER — BUPIVACAINE HCL (PF) 0.25 % IJ SOLN
INTRAMUSCULAR | Status: AC
  Filled 2013-09-25: qty 30

## 2013-09-25 MED ORDER — BUPIVACAINE LIPOSOME 1.3 % IJ SUSP
INTRAMUSCULAR | Status: DC | PRN
  Administered 2013-09-25: 20 mL

## 2013-09-25 MED ORDER — INSULIN ASPART 100 UNIT/ML ~~LOC~~ SOLN
0.0000 [IU] | Freq: Three times a day (TID) | SUBCUTANEOUS | Status: DC
  Administered 2013-09-26: 3 [IU] via SUBCUTANEOUS
  Administered 2013-09-26: 8 [IU] via SUBCUTANEOUS
  Administered 2013-09-26: 5 [IU] via SUBCUTANEOUS
  Administered 2013-09-27 – 2013-09-28 (×4): 3 [IU] via SUBCUTANEOUS

## 2013-09-25 MED ORDER — DEXAMETHASONE 6 MG PO TABS
10.0000 mg | ORAL_TABLET | Freq: Every day | ORAL | Status: AC
  Administered 2013-09-26: 10 mg via ORAL
  Filled 2013-09-25: qty 1

## 2013-09-25 MED ORDER — SODIUM CHLORIDE 0.9 % IJ SOLN
INTRAMUSCULAR | Status: DC | PRN
  Administered 2013-09-25: 30 mL

## 2013-09-25 MED ORDER — MIDAZOLAM HCL 2 MG/2ML IJ SOLN
INTRAMUSCULAR | Status: AC
  Filled 2013-09-25: qty 2

## 2013-09-25 MED ORDER — PHENYLEPHRINE HCL 10 MG/ML IJ SOLN
10.0000 mg | INTRAVENOUS | Status: DC | PRN
  Administered 2013-09-25: 40 ug/min via INTRAVENOUS

## 2013-09-25 MED ORDER — LACTATED RINGERS IV SOLN
INTRAVENOUS | Status: DC
  Administered 2013-09-25: 1000 mL via INTRAVENOUS

## 2013-09-25 MED ORDER — SODIUM CHLORIDE 0.9 % IV SOLN: INTRAVENOUS | Status: DC

## 2013-09-25 MED ORDER — ACETAMINOPHEN 500 MG PO TABS
1000.0000 mg | ORAL_TABLET | Freq: Four times a day (QID) | ORAL | Status: AC
  Administered 2013-09-25 – 2013-09-26 (×3): 1000 mg via ORAL
  Filled 2013-09-25 (×4): qty 2

## 2013-09-25 MED ORDER — METOCLOPRAMIDE HCL 10 MG PO TABS: 5.0000 mg | ORAL_TABLET | Freq: Three times a day (TID) | ORAL | Status: DC | PRN

## 2013-09-25 MED ORDER — LATANOPROST 0.005 % OP SOLN
1.0000 [drp] | Freq: Every day | OPHTHALMIC | Status: DC
  Administered 2013-09-25 – 2013-09-27 (×3): 1 [drp] via OPHTHALMIC
  Filled 2013-09-25: qty 2.5

## 2013-09-25 MED ORDER — ONDANSETRON HCL 4 MG/2ML IJ SOLN
4.0000 mg | Freq: Four times a day (QID) | INTRAMUSCULAR | Status: DC | PRN
  Administered 2013-09-25 – 2013-09-26 (×2): 4 mg via INTRAVENOUS
  Filled 2013-09-25 (×2): qty 2

## 2013-09-25 MED ORDER — POLYETHYLENE GLYCOL 3350 17 G PO PACK: 17.0000 g | PACK | Freq: Every day | ORAL | Status: DC | PRN

## 2013-09-25 MED ORDER — LOSARTAN POTASSIUM 50 MG PO TABS
50.0000 mg | ORAL_TABLET | Freq: Every morning | ORAL | Status: DC
  Administered 2013-09-25 – 2013-09-28 (×4): 50 mg via ORAL
  Filled 2013-09-25 (×4): qty 1

## 2013-09-25 MED ORDER — MENTHOL 3 MG MT LOZG: 1.0000 | LOZENGE | OROMUCOSAL | Status: DC | PRN

## 2013-09-25 MED ORDER — FENTANYL CITRATE 0.05 MG/ML IJ SOLN
INTRAMUSCULAR | Status: AC
  Filled 2013-09-25: qty 2

## 2013-09-25 MED ORDER — PHENOL 1.4 % MT LIQD: 1.0000 | OROMUCOSAL | Status: DC | PRN

## 2013-09-25 MED ORDER — ACETAMINOPHEN 10 MG/ML IV SOLN
1000.0000 mg | Freq: Once | INTRAVENOUS | Status: DC
  Filled 2013-09-25: qty 100

## 2013-09-25 MED ORDER — RIVAROXABAN 10 MG PO TABS
10.0000 mg | ORAL_TABLET | Freq: Every day | ORAL | Status: DC
  Administered 2013-09-26 – 2013-09-28 (×3): 10 mg via ORAL
  Filled 2013-09-25 (×4): qty 1

## 2013-09-25 MED ORDER — FENTANYL CITRATE 0.05 MG/ML IJ SOLN
INTRAMUSCULAR | Status: DC | PRN
  Administered 2013-09-25: 50 ug via INTRAVENOUS

## 2013-09-25 MED ORDER — METOCLOPRAMIDE HCL 5 MG/ML IJ SOLN
5.0000 mg | Freq: Three times a day (TID) | INTRAMUSCULAR | Status: DC | PRN
  Administered 2013-09-26: 10 mg via INTRAVENOUS
  Filled 2013-09-25: qty 2

## 2013-09-25 MED ORDER — TRAMADOL HCL 50 MG PO TABS
50.0000 mg | ORAL_TABLET | Freq: Four times a day (QID) | ORAL | Status: DC | PRN
  Administered 2013-09-26 – 2013-09-27 (×2): 100 mg via ORAL
  Administered 2013-09-27: 50 mg via ORAL
  Filled 2013-09-25 (×2): qty 2
  Filled 2013-09-25: qty 1

## 2013-09-25 MED ORDER — POTASSIUM CHLORIDE IN NACL 20-0.45 MEQ/L-% IV SOLN
INTRAVENOUS | Status: DC
  Administered 2013-09-25: 75 mL/h via INTRAVENOUS
  Administered 2013-09-26: 04:00:00 via INTRAVENOUS
  Filled 2013-09-25 (×8): qty 1000

## 2013-09-25 SURGICAL SUPPLY — 59 items
BAG SPEC THK2 15X12 ZIP CLS (MISCELLANEOUS) ×1
BAG ZIPLOCK 12X15 (MISCELLANEOUS) ×3 IMPLANT
BANDAGE ELASTIC 6 VELCRO ST LF (GAUZE/BANDAGES/DRESSINGS) ×3 IMPLANT
BANDAGE ESMARK 6X9 LF (GAUZE/BANDAGES/DRESSINGS) ×1 IMPLANT
BLADE SAG 18X100X1.27 (BLADE) ×3 IMPLANT
BLADE SAW SGTL 11.0X1.19X90.0M (BLADE) ×3 IMPLANT
BNDG CMPR 9X6 STRL LF SNTH (GAUZE/BANDAGES/DRESSINGS) ×1
BNDG ESMARK 6X9 LF (GAUZE/BANDAGES/DRESSINGS) ×3
BOWL SMART MIX CTS (DISPOSABLE) ×3 IMPLANT
CAPT RP KNEE ×2 IMPLANT
CEMENT HV SMART SET (Cement) ×6 IMPLANT
CLOSURE WOUND 1/2 X4 (GAUZE/BANDAGES/DRESSINGS) ×1
CUFF TOURN SGL QUICK 34 (TOURNIQUET CUFF) ×3
CUFF TRNQT CYL 34X4X40X1 (TOURNIQUET CUFF) ×1 IMPLANT
DECANTER SPIKE VIAL GLASS SM (MISCELLANEOUS) ×3 IMPLANT
DRAPE EXTREMITY T 121X128X90 (DRAPE) ×3 IMPLANT
DRAPE POUCH INSTRU U-SHP 10X18 (DRAPES) ×3 IMPLANT
DRAPE U-SHAPE 47X51 STRL (DRAPES) ×3 IMPLANT
DRSG ADAPTIC 3X8 NADH LF (GAUZE/BANDAGES/DRESSINGS) ×3 IMPLANT
DRSG PAD ABDOMINAL 8X10 ST (GAUZE/BANDAGES/DRESSINGS) ×3 IMPLANT
DURAPREP 26ML APPLICATOR (WOUND CARE) ×3 IMPLANT
ELECT REM PT RETURN 9FT ADLT (ELECTROSURGICAL) ×3
ELECTRODE REM PT RTRN 9FT ADLT (ELECTROSURGICAL) ×1 IMPLANT
EVACUATOR 1/8 PVC DRAIN (DRAIN) ×3 IMPLANT
FACESHIELD WRAPAROUND (MASK) ×15 IMPLANT
FACESHIELD WRAPAROUND OR TEAM (MASK) ×5 IMPLANT
GLOVE BIO SURGEON STRL SZ7.5 (GLOVE) IMPLANT
GLOVE BIO SURGEON STRL SZ8 (GLOVE) ×3 IMPLANT
GLOVE BIOGEL PI IND STRL 8 (GLOVE) ×2 IMPLANT
GLOVE BIOGEL PI INDICATOR 8 (GLOVE) ×4
GLOVE SURG SS PI 6.5 STRL IVOR (GLOVE) IMPLANT
GOWN STRL REUS W/TWL LRG LVL3 (GOWN DISPOSABLE) ×3 IMPLANT
GOWN STRL REUS W/TWL XL LVL3 (GOWN DISPOSABLE) IMPLANT
HANDPIECE INTERPULSE COAX TIP (DISPOSABLE) ×3
IMMOBILIZER KNEE 20 (SOFTGOODS) ×3 IMPLANT
KIT BASIN OR (CUSTOM PROCEDURE TRAY) ×3 IMPLANT
MANIFOLD NEPTUNE II (INSTRUMENTS) ×3 IMPLANT
NDL SAFETY ECLIPSE 18X1.5 (NEEDLE) ×2 IMPLANT
NEEDLE HYPO 18GX1.5 SHARP (NEEDLE) ×6
NS IRRIG 1000ML POUR BTL (IV SOLUTION) ×3 IMPLANT
PACK TOTAL JOINT (CUSTOM PROCEDURE TRAY) ×3 IMPLANT
PAD ABD 8X10 STRL (GAUZE/BANDAGES/DRESSINGS) ×2 IMPLANT
PADDING CAST COTTON 6X4 STRL (CAST SUPPLIES) ×5 IMPLANT
POSITIONER SURGICAL ARM (MISCELLANEOUS) ×3 IMPLANT
SET HNDPC FAN SPRY TIP SCT (DISPOSABLE) ×1 IMPLANT
SPONGE GAUZE 4X4 12PLY (GAUZE/BANDAGES/DRESSINGS) ×3 IMPLANT
STRIP CLOSURE SKIN 1/2X4 (GAUZE/BANDAGES/DRESSINGS) ×3 IMPLANT
SUCTION FRAZIER 12FR DISP (SUCTIONS) ×3 IMPLANT
SUT MNCRL AB 4-0 PS2 18 (SUTURE) ×3 IMPLANT
SUT VIC AB 2-0 CT1 27 (SUTURE) ×9
SUT VIC AB 2-0 CT1 TAPERPNT 27 (SUTURE) ×3 IMPLANT
SUT VLOC 180 0 24IN GS25 (SUTURE) ×3 IMPLANT
SYR 20CC LL (SYRINGE) ×3 IMPLANT
SYR 50ML LL SCALE MARK (SYRINGE) ×3 IMPLANT
TOWEL OR 17X26 10 PK STRL BLUE (TOWEL DISPOSABLE) ×3 IMPLANT
TOWEL OR NON WOVEN STRL DISP B (DISPOSABLE) IMPLANT
TRAY FOLEY CATH 14FRSI W/METER (CATHETERS) ×3 IMPLANT
WATER STERILE IRR 1500ML POUR (IV SOLUTION) ×3 IMPLANT
WRAP KNEE MAXI GEL POST OP (GAUZE/BANDAGES/DRESSINGS) ×3 IMPLANT

## 2013-09-25 NOTE — Progress Notes (Signed)
Utilization review completed.  

## 2013-09-25 NOTE — Anesthesia Preprocedure Evaluation (Signed)
Anesthesia Evaluation  Patient identified by MRN, date of birth, ID band Patient awake    Reviewed: Allergy & Precautions, H&P , NPO status , Patient's Chart, lab work & pertinent test results  Airway Mallampati: II TM Distance: >3 FB Neck ROM: Full    Dental no notable dental hx.    Pulmonary shortness of breath, former smoker,  breath sounds clear to auscultation  Pulmonary exam normal       Cardiovascular hypertension, + DOE negative cardio ROS  Rhythm:Regular Rate:Normal     Neuro/Psych negative neurological ROS  negative psych ROS   GI/Hepatic negative GI ROS, Neg liver ROS,   Endo/Other  diabetes, Type 2, Oral Hypoglycemic Agents  Renal/GU negative Renal ROS  negative genitourinary   Musculoskeletal negative musculoskeletal ROS (+)   Abdominal (+) + obese,   Peds negative pediatric ROS (+)  Hematology  (+) anemia ,   Anesthesia Other Findings   Reproductive/Obstetrics negative OB ROS                           Anesthesia Physical Anesthesia Plan  ASA: II  Anesthesia Plan: Spinal   Post-op Pain Management:    Induction: Intravenous  Airway Management Planned:   Additional Equipment:   Intra-op Plan:   Post-operative Plan:   Informed Consent: I have reviewed the patients History and Physical, chart, labs and discussed the procedure including the risks, benefits and alternatives for the proposed anesthesia with the patient or authorized representative who has indicated his/her understanding and acceptance.   Dental advisory given  Plan Discussed with: CRNA  Anesthesia Plan Comments: (Discussed general and spinal. Discussed risks/benefits of spinal including headache, backache, failure, bleeding, infection, and nerve damage. Patient consents to spinal. Questions answered. Coagulation studies and platelet count acceptable.)        Anesthesia Quick Evaluation

## 2013-09-25 NOTE — Interval H&P Note (Signed)
History and Physical Interval Note:  09/25/2013 8:37 AM  Angela Chambers  has presented today for surgery, with the diagnosis of OA OF RIGHT KNEE  The various methods of treatment have been discussed with the patient and family. After consideration of risks, benefits and other options for treatment, the patient has consented to  Procedure(s): RIGHT TOTAL KNEE ARTHROPLASTY (Right) as a surgical intervention .  The patient's history has been reviewed, patient examined, no change in status, stable for surgery.  I have reviewed the patient's chart and labs.  Questions were answered to the patient's satisfaction.     Dione Plover Holland Kotter

## 2013-09-25 NOTE — Anesthesia Postprocedure Evaluation (Signed)
  Anesthesia Post-op Note  Patient: Angela Chambers  Procedure(s) Performed: Procedure(s) (LRB): RIGHT TOTAL KNEE ARTHROPLASTY (Right)  Patient Location: PACU  Anesthesia Type: Spinal  Level of Consciousness: awake and alert   Airway and Oxygen Therapy: Patient Spontanous Breathing  Post-op Pain: mild  Post-op Assessment: Post-op Vital signs reviewed, Patient's Cardiovascular Status Stable, Respiratory Function Stable, Patent Airway and No signs of Nausea or vomiting  Last Vitals:  Filed Vitals:   09/25/13 1344  BP: 143/81  Pulse: 57  Temp:   Resp:     Post-op Vital Signs: stable   Complications: No apparent anesthesia complications

## 2013-09-25 NOTE — Anesthesia Procedure Notes (Addendum)
Spinal  Patient location during procedure: OR Start time: 09/25/2013 10:45 AM End time: 09/25/2013 10:51 AM Staffing CRNA/Resident: Anne Fu Performed by: resident/CRNA  Preanesthetic Checklist Completed: patient identified, site marked, surgical consent, pre-op evaluation, timeout performed, IV checked, risks and benefits discussed and monitors and equipment checked Spinal Block Patient position: sitting Prep: Betadine Patient monitoring: heart rate, continuous pulse ox and blood pressure Approach: right paramedian Location: L2-3 Injection technique: single-shot Needle Needle type: Sprotte and Spinocan  Needle gauge: 22 G Needle length: 9 cm Assessment Sensory level: T4 Additional Notes Expiration date of kit checked and confirmed. Patient tolerated procedure well, without complications. X 1 attempt with noted clear CSF return easy aspiration and administration of medication. Noted motor and sensory loss on exam.

## 2013-09-25 NOTE — Transfer of Care (Signed)
Immediate Anesthesia Transfer of Care Note  Patient: Angela Chambers  Procedure(s) Performed: Procedure(s): RIGHT TOTAL KNEE ARTHROPLASTY (Right)  Patient Location: PACU  Anesthesia Type:Regional and Spinal  Level of Consciousness: awake, sedated and patient cooperative  Airway & Oxygen Therapy: Patient Spontanous Breathing and Patient connected to face mask oxygen  Post-op Assessment: Report given to PACU RN and Post -op Vital signs reviewed and stable  Post vital signs: Reviewed and stable  Complications: No apparent anesthesia complications

## 2013-09-25 NOTE — Op Note (Signed)
Pre-operative diagnosis- Osteoarthritis  Right knee(s)  Post-operative diagnosis- Osteoarthritis Right knee(s)  Procedure-  Right  Total Knee Arthroplasty  Surgeon- Dione Plover. Alexya Mcdaris, MD  Assistant- Arlee Muslim, PA-C   Anesthesia-  Spinal  EBL-* No blood loss amount entered *   Drains Hemovac  Tourniquet time-  Total Tourniquet Time Documented: Thigh (Right) - 34 minutes Total: Thigh (Right) - 34 minutes     Complications- None  Condition-PACU - hemodynamically stable.   Brief Clinical Note  Angela Chambers is a 73 y.o. year old female with end stage OA of her right knee with progressively worsening pain and dysfunction. She has constant pain, with activity and at rest and significant functional deficits with difficulties even with ADLs. She has had extensive non-op management including analgesics, injections of cortisone and viscosupplements, and home exercise program, but remains in significant pain with significant dysfunction.Radiographs show bone on bone arthritis all 3 compartments with retained hardware in tibia . She presents now for right Total Knee Arthroplasty.    Procedure in detail---   The patient is brought into the operating room and positioned supine on the operating table. After successful administration of  Spinal,   a tourniquet is placed high on the  Right thigh(s) and the lower extremity is prepped and draped in the usual sterile fashion. Time out is performed by the operating team and then the  Right lower extremity is wrapped in Esmarch, knee flexed and the tourniquet inflated to 300 mmHg.       A midline incision is made with a ten blade through the subcutaneous tissue to the level of the extensor mechanism. A fresh blade is used to make a medial parapatellar arthrotomy. Soft tissue over the proximal medial tibia is subperiosteally elevated to the joint line with a knife and into the semimembranosus bursa with a Cobb elevator. Soft tissue over the proximal  lateral tibia is elevated with attention being paid to avoiding the patellar tendon on the tibial tubercle. The patella is everted, knee flexed 90 degrees and the ACL and PCL are removed. Findings are bone on bone all 3 compartments with massive global osteophytes.        The drill is used to create a starting hole in the distal femur and the canal is thoroughly irrigated with sterile saline to remove the fatty contents. The 5 degree Right  valgus alignment guide is placed into the femoral canal and the distal femoral cutting block is pinned to remove 10 mm off the distal femur. Resection is made with an oscillating saw.      The tibia is subluxed forward and the menisci are removed. The extramedullary alignment guide is placed referencing proximally at the medial aspect of the tibial tubercle and distally along the second metatarsal axis and tibial crest. The block is pinned to remove 78mm off the more deficient lateral  side. I removed the more proximal of the threaded pins in the tibia.Resection is made with an oscillating saw. The more distal pin was not in the way of our tibial preparation thus was left intact.Size 3is the most appropriate size for the tibia and the proximal tibia is prepared with the modular drill and keel punch for that size.      The femoral sizing guide is placed and size 3 is most appropriate. Rotation is marked off the epicondylar axis and confirmed by creating a rectangular flexion gap at 90 degrees. The size 3 cutting block is pinned in this rotation and the  anterior, posterior and chamfer cuts are made with the oscillating saw. The intercondylar block is then placed and that cut is made.      Trial size 3 tibial component, trial size 3 posterior stabilized femur and a 15  mm posterior stabilized rotating platform insert trial is placed. Full extension is achieved with excellent varus/valgus and anterior/posterior balance throughout full range of motion. The patella is everted and  thickness measured to be 22  mm. Free hand resection is taken to 12 mm, a 38 template is placed, lug holes are drilled, trial patella is placed, and it tracks normally. Osteophytes are removed off the posterior femur with the trial in place. All trials are removed and the cut bone surfaces prepared with pulsatile lavage. Cement is mixed and once ready for implantation, the size 3 tibial implant, size  3 posterior stabilized femoral component, and the size 38 patella are cemented in place and the patella is held with the clamp. The trial insert is placed and the knee held in full extension. The Exparel (20 ml mixed with 30 ml saline) and .25% Bupivicaine, are injected into the extensor mechanism, posterior capsule, medial and lateral gutters and subcutaneous tissues.  All extruded cement is removed and once the cement is hard the permanent 15 mm posterior stabilized rotating platform insert is placed into the tibial tray.      The wound is copiously irrigated with saline solution and the extensor mechanism closed over a hemovac drain with #1 V-loc suture. The tourniquet is released for a total tourniquet time of 33  minutes. Flexion against gravity is 140 degrees and the patella tracks normally. Subcutaneous tissue is closed with 2.0 vicryl and subcuticular with running 4.0 Monocryl. The incision is cleaned and dried and steri-strips and a bulky sterile dressing are applied. The limb is placed into a knee immobilizer and the patient is awakened and transported to recovery in stable condition.      Please note that a surgical assistant was a medical necessity for this procedure in order to perform it in a safe and expeditious manner. Surgical assistant was necessary to retract the ligaments and vital neurovascular structures to prevent injury to them and also necessary for proper positioning of the limb to allow for anatomic placement of the prosthesis.   Dione Plover Alicyn Klann, MD    09/25/2013, 11:46 AM

## 2013-09-26 DIAGNOSIS — E871 Hypo-osmolality and hyponatremia: Secondary | ICD-10-CM | POA: Diagnosis not present

## 2013-09-26 DIAGNOSIS — D62 Acute posthemorrhagic anemia: Secondary | ICD-10-CM | POA: Diagnosis not present

## 2013-09-26 LAB — BASIC METABOLIC PANEL
BUN: 10 mg/dL (ref 6–23)
CALCIUM: 8.8 mg/dL (ref 8.4–10.5)
CHLORIDE: 97 meq/L (ref 96–112)
CO2: 28 meq/L (ref 19–32)
Creatinine, Ser: 0.66 mg/dL (ref 0.50–1.10)
GFR calc Af Amer: >90 mL/min (ref >90)
GFR calc non Af Amer: 86 mL/min — ABNORMAL LOW (ref >90)
GLUCOSE: 205 mg/dL — AB (ref 70–99)
Potassium: 4.2 mEq/L (ref 3.7–5.3)
Sodium: 136 mEq/L — ABNORMAL LOW (ref 137–147)

## 2013-09-26 LAB — GLUCOSE, CAPILLARY
GLUCOSE-CAPILLARY: 195 mg/dL — AB (ref 70–99)
GLUCOSE-CAPILLARY: 346 mg/dL — AB (ref 70–99)
GLUCOSE-CAPILLARY: 293 mg/dL — AB (ref 70–99)
GLUCOSE-CAPILLARY: 224 mg/dL — AB (ref 70–99)
Glucose-Capillary: 196 mg/dL — ABNORMAL HIGH (ref 70–99)
Glucose-Capillary: 309 mg/dL — ABNORMAL HIGH (ref 70–99)
Glucose-Capillary: 254 mg/dL — ABNORMAL HIGH (ref 70–99)

## 2013-09-26 LAB — CBC
HEMATOCRIT: 30 % — AB (ref 36.0–46.0)
Hemoglobin: 10.4 g/dL — ABNORMAL LOW (ref 12.0–15.0)
MCH: 28.3 pg (ref 26.0–34.0)
MCHC: 34.7 g/dL (ref 30.0–36.0)
MCV: 81.7 fL (ref 78.0–100.0)
Platelets: 247 10*3/uL (ref 150–400)
RBC: 3.67 MIL/uL — AB (ref 3.87–5.11)
RDW: 14.8 % (ref 11.5–15.5)
WBC: 8.4 10*3/uL (ref 4.0–10.5)

## 2013-09-26 MED ORDER — PROMETHAZINE HCL 25 MG/ML IJ SOLN
6.2500 mg | Freq: Four times a day (QID) | INTRAMUSCULAR | Status: DC | PRN
  Administered 2013-09-26: 12.5 mg via INTRAVENOUS
  Filled 2013-09-26: qty 1

## 2013-09-26 NOTE — Progress Notes (Signed)
   Subjective: 1 Day Post-Op Procedure(s) (LRB): RIGHT TOTAL KNEE ARTHROPLASTY (Right) Patient reports pain as mild.   Patient seen in rounds by Dr. Wynelle Link. Patient is well, but has had some minor complaints of pain in the knee, requiring pain medications We will start therapy today.  Plan is to go Home after hospital stay.  Objective: Vital signs in last 24 hours: Temp:  [97.4 F (36.3 C)-99 F (37.2 C)] 99 F (37.2 C) (04/28 0549) Pulse Rate:  [57-85] 81 (04/28 0549) Resp:  [11-20] 20 (04/28 0549) BP: (113-179)/(55-90) 155/68 mmHg (04/28 0549) SpO2:  [97 %-100 %] 100 % (04/28 0549) Weight:  [90.719 kg (200 lb)] 90.719 kg (200 lb) (04/27 1344)  Intake/Output from previous day:  Intake/Output Summary (Last 24 hours) at 09/26/13 0928 Last data filed at 09/26/13 1572  Gross per 24 hour  Intake 4327.5 ml  Output   4235 ml  Net   92.5 ml    Intake/Output this shift: Total I/O In: 120 [P.O.:120] Out: -   Labs:  Recent Labs  09/26/13 0427  HGB 10.4*    Recent Labs  09/26/13 0427  WBC 8.4  RBC 3.67*  HCT 30.0*  PLT 247    Recent Labs  09/26/13 0427  NA 136*  K 4.2  CL 97  CO2 28  BUN 10  CREATININE 0.66  GLUCOSE 205*  CALCIUM 8.8   No results found for this basename: LABPT, INR,  in the last 72 hours  EXAM General - Patient is Alert, Appropriate and Oriented Extremity - Neurovascular intact Sensation intact distally Dressing - dressing C/D/I Motor Function - intact, moving foot and toes well on exam.  Hemovac pulled without difficulty.  Past Medical History  Diagnosis Date  . Hyperlipidemia   . Diabetes mellitus   . Glaucoma   . Fibroids   . Arthritis   . Cataract   . Osteoporosis   . Palpitations     CONTROLLED ON BETA BLOCKER  . Hypertension 07 /2013     WITH EF GREATER THAN 55 % WITH MILD MR BUT NO PROLASPE ,ESSENTIALLY NORMAL DONE TO EVALUATE PALPITATIONS .  Marland Kitchen Obesity (BMI 30.0-34.9)     Assessment/Plan: 1 Day Post-Op  Procedure(s) (LRB): RIGHT TOTAL KNEE ARTHROPLASTY (Right) Principal Problem:   OA (osteoarthritis) of knee Active Problems:   Hyponatremia   Postoperative anemia due to acute blood loss  Estimated body mass index is 33.28 kg/(m^2) as calculated from the following:   Height as of this encounter: 5\' 5"  (1.651 m).   Weight as of this encounter: 90.719 kg (200 lb). Advance diet Up with therapy Plan for discharge tomorrow Discharge home with home health  DVT Prophylaxis - Xarelto Weight-Bearing as tolerated to right leg D/C O2 and Pulse OX and try on Room Air  Alexzandrew Perkins 09/26/2013, 9:28 AM

## 2013-09-26 NOTE — Evaluation (Signed)
Physical Therapy Evaluation Patient Details Name: Angela Chambers MRN: [id number] DOB: [birth year] Today's Date: 09/26/2013   History of Present Illness  R TKA  Clinical Impression  *Pt admitted with R TKA**. Pt currently with functional limitations due to the deficits listed below (see PT Problem List).  Pt will benefit from skilled PT to increase their independence and safety with mobility to allow discharge to the venue listed below.    Sit to stand with mod assist. Pt became nauseous after standing briefly, assisted pt back to recliner where she began vomiting. RN notified. Vital signs stable. Expect good progress once nausea resolves.  **    Follow Up Recommendations Home health PT    Equipment Recommendations  None recommended by PT    Recommendations for Other Services OT consult     Precautions / Restrictions Precautions Precautions: Fall Restrictions Weight Bearing Restrictions: No Other Position/Activity Restrictions: WBAT      Mobility  Bed Mobility                  Transfers Overall transfer level: Needs assistance Equipment used: Rolling walker (2 wheeled) Transfers: Sit to/from Stand Sit to Stand: Mod assist         General transfer comment: assist to rise, pt stood with walker briefly, then became nauseous and had to sit. RN notified of nausea and vomiting. VSS.  Ambulation/Gait                Stairs            Wheelchair Mobility    Modified Rankin (Stroke Patients Only)       Balance                                             Pertinent Vitals/Pain Vitals after pt became nauseous BP 120/73 HR 89 SaO2 94%  Pain R knee 5/10, premedicated, ice applied*    Home Living Family/patient expects to be discharged to:: Private residence Living Arrangements: Spouse/significant other Available Help at Discharge: Family;Available 24 hours/day Type of Home: House Home Access: Stairs to enter    [employer] of Steps: 1 Home Layout: Two level Home Equipment: Walker - 2 wheels;Bedside commode;Shower seat;Cane - single point      Prior Function Level of Independence: Independent               Hand Dominance        Extremity/Trunk Assessment   Upper Extremity Assessment: Overall WFL for tasks assessed           Lower Extremity Assessment: RLE deficits/detail RLE Deficits / Details: SLR 2/5, knee flexion 45* AAROM, ankle WNL       Communication      Cognition Arousal/Alertness: Awake/alert Behavior During Therapy: WFL for tasks assessed/performed Overall Cognitive Status: Within Functional Limits for tasks assessed                      General Comments      Exercises Total Joint Exercises Ankle Circles/Pumps: AROM;Both;10 reps;Supine Quad Sets: AROM;Right;5 reps;Supine Heel Slides: AAROM;Right;10 reps;Supine Straight Leg Raises: AAROM;Right;5 reps;Supine      Assessment/Plan    PT Assessment Patient needs continued PT services  PT Diagnosis Difficulty walking;Acute pain   PT Problem List Decreased strength;Decreased range of motion;Decreased activity tolerance;Pain;Decreased mobility  PT Treatment Interventions Stair training;Gait training;DME instruction;Therapeutic activities;Therapeutic  exercise;Patient/family education;Functional mobility training   PT Goals (Current goals can be found in the Care Plan section) Acute Rehab PT Goals Patient Stated Goal: to go upstairs to bedroom PT Goal Formulation: With patient Time For Goal Achievement: 10/03/13 Potential to Achieve Goals: Good    Frequency 7X/week   Barriers to discharge        Co-evaluation               End of Session Equipment Utilized During Treatment: Gait belt Activity Tolerance: Treatment limited secondary to medical complications (Comment) (nausea and vomiting) Patient left: in chair;with call bell/phone within reach;with family/visitor  present Nurse Communication: Mobility status (nausea and vomiting)         Time: 6384-5364 PT Time Calculation (min): 18 min   Charges:   PT Evaluation $Initial PT Evaluation Tier I: 1 Procedure PT Treatments $Therapeutic Activity: 8-22 mins   PT G CodesLucile Crater 09/26/2013, 10:47 AM [phone]

## 2013-09-26 NOTE — Progress Notes (Signed)
Attempted to see pt. Pt with n/v this am and not feeling well.  Will attempt back as schedule allows. Jinger Neighbors, [place] [phone]

## 2013-09-26 NOTE — Care Management Note (Signed)
    Page 1 of 1   09/26/2013     12:20:15 PM CARE MANAGEMENT NOTE 09/26/2013  Patient:  Angela Chambers, Angela Chambers   Account Number:  [id number]  Date Initiated:  09/26/2013  Documentation initiated by:  [facility]  Subjective/Objective Assessment:   adm: R total knee arthroplasty; LRB     Action/Plan:   discharge planning   Anticipated DC Date:  09/27/2013   Anticipated DC Plan:  [place]  CM consult      [facility] Choice  HOME HEALTH   Choice offered to / List presented to:  C-1 Patient        [place] arranged  HH-2 PT      [place]   Status of service:  Completed, signed off Medicare Important Message given?   (If response is "NO", the following Medicare IM given date fields will be blank) Date Medicare IM given:   Date Additional Medicare IM given:    Discharge Disposition:  [place]  Per UR Regulation:    If discussed at Long Length of Stay Meetings, dates discussed:    Comments:  09/26/13 12:10 CM made aware pt has chosen [place] to render HHPT.  No DME recc. by PT.  No other CM needs were communicated.  Mariane Masters, BSN, CM [phone].

## 2013-09-26 NOTE — Progress Notes (Signed)
Physical Therapy Treatment Patient Details Name: Angela Chambers MRN: [id number] DOB: June 24, 1940 Today's Date: 09/26/2013    History of Present Illness R TKA    PT Comments    **Pt very lethargic, likely due to meds for nausea. Assisted with pivot to [facility], then short walk to bed. Performed TKA exercises. Pt reported strong urge to urinate but was unable to after ~8 minutes of attempting to. RN notified. *  Follow Up Recommendations  Home health PT     Equipment Recommendations  None recommended by PT    Recommendations for Other Services OT consult     Precautions / Restrictions Precautions Precautions: Fall Restrictions Weight Bearing Restrictions: No Other Position/Activity Restrictions: WBAT    Mobility  Bed Mobility Overal bed mobility: Needs Assistance Bed Mobility: Sit to Supine       Sit to supine: Min assist   General bed mobility comments: for RLE  Transfers Overall transfer level: Needs assistance Equipment used: Rolling walker (2 wheeled) Transfers: Sit to/from [facility] Sit to Stand: Mod assist         General transfer comment: assist to rise, recliner to [facility] then to bed  Ambulation/Gait Ambulation/Gait assistance: Min guard Ambulation Distance (Feet): 5 Feet Assistive device: Rolling walker (2 wheeled)     Gait velocity interpretation: Below normal speed for age/gender General Gait Details: VCs sequencing   Stairs            Wheelchair Mobility    Modified Rankin (Stroke Patients Only)       Balance                                    Cognition Arousal/Alertness: Awake/alert Behavior During Therapy: WFL for tasks assessed/performed Overall Cognitive Status: Within Functional Limits for tasks assessed                      Exercises Total Joint Exercises Ankle Circles/Pumps: AROM;Both;10 reps;Supine Quad Sets: AROM;Right;5 reps;Supine Short Arc Quad: AAROM;Right;10 reps Heel  Slides: AAROM;Right;10 reps;Supine Hip ABduction/ADduction: AAROM;Right;10 reps Straight Leg Raises: AAROM;Right;Supine;10 reps Goniometric ROM: 40* R knee flexion AAROM    General Comments        Pertinent Vitals/Pain **5/10 R knee with activity Ice applied*    Home Living                      Prior Function            PT Goals (current goals can now be found in the care plan section) Acute Rehab PT Goals Patient Stated Goal: to go upstairs to bedroom PT Goal Formulation: With patient Time For Goal Achievement: 10/03/13 Potential to Achieve Goals: Good    Frequency  7X/week    PT Plan Current plan remains appropriate    Co-evaluation             End of Session Equipment Utilized During Treatment: Gait belt Activity Tolerance: Treatment limited secondary to medical complications (Comment);Patient limited by lethargy (lethargic likely 2* nausea medicine) Patient left: in bed;with call bell/phone within reach     Time: 1340-1404 PT Time Calculation (min): 24 min  Charges:  $Therapeutic Exercise: 8-22 mins $Therapeutic Activity: 8-22 mins                    G Codes:      Lucile Crater 09/26/2013, 2:17 PM [phone]

## 2013-09-26 NOTE — Discharge Instructions (Addendum)
° °Dr. Frank Aluisio °Total Joint Specialist °[facility] °[address] °[place], [place] [address] °[phone] ° °TOTAL KNEE REPLACEMENT POSTOPERATIVE DIRECTIONS ° ° ° °Knee Rehabilitation, Guidelines Following Surgery  °Results after knee surgery are often greatly improved when you follow the exercise, range of motion and muscle strengthening exercises prescribed by your doctor. Safety measures are also important to protect the knee from further injury. Any time any of these exercises cause you to have increased pain or swelling in your knee joint, decrease the amount until you are comfortable again and slowly increase them. If you have problems or questions, call your caregiver or physical therapist for advice.  ° °HOME CARE INSTRUCTIONS  °Remove items at home which could result in a fall. This includes throw rugs or furniture in walking pathways.  °Continue medications as instructed at time of discharge. °You may have some home medications which will be placed on hold until you complete the course of blood thinner medication.  °You may start showering once you are discharged home but do not submerge the incision under water. Just pat the incision dry and apply a dry gauze dressing on daily. °Walk with walker as instructed.  °You may resume a sexual relationship in one month or when given the OK by  your doctor.  °· Use walker as long as suggested by your caregivers. °· Avoid periods of inactivity such as sitting longer than an hour when not asleep. This helps prevent blood clots.  °You may put full weight on your legs and walk as much as is comfortable.  °You may return to work once you are cleared by your doctor.  °Do not drive a car for 6 weeks or until released by you surgeon.  °· Do not drive while taking narcotics.  °Wear the elastic stockings for three weeks following surgery during the day but you may remove then at night. °Make sure you keep all of your appointments after your  operation with all of your doctors and caregivers. You should call the office at the above phone number and make an appointment for approximately two weeks after the date of your surgery. °Change the dressing daily and reapply a dry dressing each time. °Please pick up a stool softener and laxative for home use as long as you are requiring pain medications. °· Continue to use ice on the knee for pain and swelling from surgery. You may notice swelling that will progress down to the foot and ankle.  This is normal after surgery.  Elevate the leg when you are not up walking on it.   °It is important for you to complete the blood thinner medication as prescribed by your doctor. °· Continue to use the breathing machine which will help keep your temperature down.  It is common for your temperature to cycle up and down following surgery, especially at night when you are not up moving around and exerting yourself.  The breathing machine keeps your lungs expanded and your temperature down. ° °RANGE OF MOTION AND STRENGTHENING EXERCISES  °Rehabilitation of the knee is important following a knee injury or an operation. After just a few days of immobilization, the muscles of the thigh which control the knee become weakened and shrink (atrophy). Knee exercises are designed to build up the tone and strength of the thigh muscles and to improve knee motion. Often times heat used for twenty to thirty minutes before working out will loosen up your tissues and help with improving the   range of motion but do not use heat for the first two weeks following surgery. These exercises can be done on a training (exercise) mat, on the floor, on a table or on a bed. Use what ever works the best and is most comfortable for you Knee exercises include:  Leg Lifts - While your knee is still immobilized in a splint or cast, you can do straight leg raises. Lift the leg to 60 degrees, hold for 3 sec, and slowly lower the leg. Repeat 10-20 times 2-3  times daily. Perform this exercise against resistance later as your knee gets better.  Quad and Hamstring Sets - Tighten up the muscle on the front of the thigh (Quad) and hold for 5-10 sec. Repeat this 10-20 times hourly. Hamstring sets are done by pushing the foot backward against an object and holding for 5-10 sec. Repeat as with quad sets.  A rehabilitation program following serious knee injuries can speed recovery and prevent re-injury in the future due to weakened muscles. Contact your doctor or a physical therapist for more information on knee rehabilitation.   SKILLED REHAB INSTRUCTIONS: If the patient is transferred to a skilled rehab facility following release from the hospital, a list of the current medications will be sent to the facility for the patient to continue.  When discharged from the skilled rehab facility, please have the facility set up the patient's Derma prior to being released. Also, the skilled facility will be responsible for providing the patient with their medications at time of release from the facility to include their pain medication, the muscle relaxants, and their blood thinner medication. If the patient is still at the rehab facility at time of the two week follow up appointment, the skilled rehab facility will also need to assist the patient in arranging follow up appointment in our office and any transportation needs.  MAKE SURE YOU:  Understand these instructions.  Will watch your condition.  Will get help right away if you are not doing well or get worse.    Pick up stool softner and laxative for home. Do not submerge incision under water. May shower. Continue to use ice for pain and swelling from surgery.   Take Xarelto for two and a half more weeks, then discontinue Xarelto. Once the patient has completed the Xarelto, they may resume the 81 mg Aspirin.   Information on my medicine - XARELTO (Rivaroxaban)  This medication  education was reviewed with me or my healthcare representative as part of my discharge preparation.  The pharmacist that spoke with me during my hospital stay was:  Joycelyn Rua, [facility]  Why was Xarelto prescribed for you? Xarelto was prescribed for you to reduce the risk of blood clots forming after orthopedic surgery. The medical term for these abnormal blood clots is venous thromboembolism (VTE).  What do you need to know about xarelto ? Take your Xarelto ONCE DAILY at the same time every day. You may take it either with or without food.  If you have difficulty swallowing the tablet whole, you may crush it and mix in applesauce just prior to taking your dose.  Take Xarelto exactly as prescribed by your doctor and DO NOT stop taking Xarelto without talking to the doctor who prescribed the medication.  Stopping without other VTE prevention medication to take the place of Xarelto may increase your risk of developing a clot.  After discharge, you should have regular check-up appointments with your healthcare provider that  is prescribing your Xarelto.    What do you do if you miss a dose? If you miss a dose, take it as soon as you remember on the same day then continue your regularly scheduled once daily regimen the next day. Do not take two doses of Xarelto on the same day.   Important Safety Information A possible side effect of Xarelto is bleeding. You should call your healthcare provider right away if you experience any of the following:   Bleeding from an injury or your nose that does not stop.   Unusual colored urine (red or dark brown) or unusual colored stools (red or black).   Unusual bruising for unknown reasons.   A serious fall or if you hit your head (even if there is no bleeding).  Some medicines may interact with Xarelto and might increase your risk of bleeding while on Xarelto. To help avoid this, consult your healthcare provider or pharmacist prior to using any  new prescription or non-prescription medications, including herbals, vitamins, non-steroidal anti-inflammatory drugs (NSAIDs) and supplements.  This website has more information on Xarelto: [email or web address]

## 2013-09-27 LAB — CBC
HCT: 26.9 % — ABNORMAL LOW (ref 36.0–46.0)
HEMOGLOBIN: 9.3 g/dL — AB (ref 12.0–15.0)
MCH: 28.3 pg (ref 26.0–34.0)
MCHC: 34.6 g/dL (ref 30.0–36.0)
MCV: 81.8 fL (ref 78.0–100.0)
PLATELETS: 218 10*3/uL (ref 150–400)
RBC: 3.29 MIL/uL — AB (ref 3.87–5.11)
RDW: 14.9 % (ref 11.5–15.5)
WBC: 10.6 10*3/uL — ABNORMAL HIGH (ref 4.0–10.5)

## 2013-09-27 LAB — GLUCOSE, CAPILLARY
Glucose-Capillary: 188 mg/dL — ABNORMAL HIGH (ref 70–99)
Glucose-Capillary: 198 mg/dL — ABNORMAL HIGH (ref 70–99)
Glucose-Capillary: 162 mg/dL — ABNORMAL HIGH (ref 70–99)
Glucose-Capillary: 167 mg/dL — ABNORMAL HIGH (ref 70–99)

## 2013-09-27 LAB — BASIC METABOLIC PANEL
BUN: 13 mg/dL (ref 6–23)
CALCIUM: 9.1 mg/dL (ref 8.4–10.5)
CO2: 26 mEq/L (ref 19–32)
Chloride: 101 mEq/L (ref 96–112)
Creatinine, Ser: 0.77 mg/dL (ref 0.50–1.10)
GFR, EST NON AFRICAN AMERICAN: 81 mL/min — AB (ref >90)
Glucose, Bld: 189 mg/dL — ABNORMAL HIGH (ref 70–99)
POTASSIUM: 3.7 meq/L (ref 3.7–5.3)
Sodium: 138 mEq/L (ref 137–147)

## 2013-09-27 MED ORDER — RIVAROXABAN 10 MG PO TABS: 10.0000 mg | ORAL_TABLET | Freq: Every day | ORAL | Status: DC

## 2013-09-27 MED ORDER — TRAMADOL HCL 50 MG PO TABS: 50.0000 mg | ORAL_TABLET | Freq: Four times a day (QID) | ORAL | Status: DC | PRN

## 2013-09-27 MED ORDER — METHOCARBAMOL 500 MG PO TABS: 500.0000 mg | ORAL_TABLET | Freq: Four times a day (QID) | ORAL | Status: DC | PRN

## 2013-09-27 MED ORDER — OXYCODONE HCL 5 MG PO TABS: 5.0000 mg | ORAL_TABLET | ORAL | Status: DC | PRN

## 2013-09-27 NOTE — Discharge Summary (Signed)
Physician Discharge Summary   Patient ID: Angela Chambers MRN: [id number] DOB/AGE: 73-Jun-1942 73 y.o.  Admit date: 09/25/2013 Discharge date: 09-27-2013  Primary Diagnosis:  Osteoarthritis Right knee(s)  Admission Diagnoses:  Past Medical History  Diagnosis Date  . Hyperlipidemia   . Diabetes mellitus   . Glaucoma   . Fibroids   . Arthritis   . Cataract   . Osteoporosis   . Palpitations     CONTROLLED ON BETA BLOCKER  . Hypertension 07 /2013     WITH EF GREATER THAN 55 % WITH MILD MR BUT NO PROLASPE ,ESSENTIALLY NORMAL DONE TO EVALUATE PALPITATIONS .  Marland Kitchen Obesity (BMI 30.0-34.9)    Discharge Diagnoses:   Principal Problem:   OA (osteoarthritis) of knee Active Problems:   Hyponatremia   Postoperative anemia due to acute blood loss  Estimated body mass index is 33.28 kg/(m^2) as calculated from the following:   Height as of this encounter: _0  (1.651 m).   Weight as of this encounter: 90.719 kg (200 lb).  Procedure:  Procedure(s) (LRB): RIGHT TOTAL KNEE ARTHROPLASTY (Right)   Consults: None  HPI: Angela Chambers is a 73 y.o. year old female with end stage OA of her right knee with progressively worsening pain and dysfunction. She has constant pain, with activity and at rest and significant functional deficits with difficulties even with ADLs. She has had extensive non-op management including analgesics, injections of cortisone and viscosupplements, and home exercise program, but remains in significant pain with significant dysfunction.Radiographs show bone on bone arthritis all 3 compartments with retained hardware in tibia . She presents now for right Total Knee Arthroplasty.   Laboratory Data: Admission on 09/25/2013, Discharged on 09/28/2013  Component Date Value Ref Range Status  . ABO/RH(D) 09/25/2013 O POS   Final  . Antibody Screen 09/25/2013 NEG   Final  . Sample Expiration 09/25/2013 09/28/2013   Final  . Glucose-Capillary 09/25/2013 139* 70 - 99  mg/dL Final  . Comment 1 09/25/2013 Documented in Chart   Final  . Glucose-Capillary 09/25/2013 113* 70 - 99 mg/dL Final  . Glucose-Capillary 09/25/2013 108* 70 - 99 mg/dL Final  . WBC 09/26/2013 8.4  4.0 - 10.5 K/uL Final  . RBC 09/26/2013 3.67* 3.87 - 5.11 MIL/uL Final  . Hemoglobin 09/26/2013 10.4* 12.0 - 15.0 g/dL Final  . HCT 09/26/2013 30.0* 36.0 - 46.0 % Final  . MCV 09/26/2013 81.7  78.0 - 100.0 fL Final  . MCH 09/26/2013 28.3  26.0 - 34.0 pg Final  . MCHC 09/26/2013 34.7  30.0 - 36.0 g/dL Final  . RDW 09/26/2013 14.8  11.5 - 15.5 % Final  . Platelets 09/26/2013 247  150 - 400 K/uL Final  . Sodium 09/26/2013 136* 137 - 147 mEq/L Final  . Potassium 09/26/2013 4.2  3.7 - 5.3 mEq/L Final  . Chloride 09/26/2013 97  96 - 112 mEq/L Final  . CO2 09/26/2013 28  19 - 32 mEq/L Final  . Glucose, Bld 09/26/2013 205* 70 - 99 mg/dL Final  . BUN 09/26/2013 10  6 - 23 mg/dL Final  . Creatinine, Ser 09/26/2013 0.66  0.50 - 1.10 mg/dL Final  . Calcium 09/26/2013 8.8  8.4 - 10.5 mg/dL Final  . GFR calc non Af Amer 09/26/2013 86* >90 mL/min Final  . GFR calc Af Amer 09/26/2013 >90  >90 mL/min Final   Comment: (NOTE)  The eGFR has been calculated using the CKD EPI equation.                          This calculation has not been validated in all clinical situations.                          eGFR's persistently <90 mL/min signify possible Chronic Kidney                          Disease.  . Glucose-Capillary 09/25/2013 195* 70 - 99 mg/dL Final  . Glucose-Capillary 09/26/2013 196* 70 - 99 mg/dL Final  . Glucose-Capillary 09/26/2013 309* 70 - 99 mg/dL Final  . WBC 09/27/2013 10.6* 4.0 - 10.5 K/uL Final  . RBC 09/27/2013 3.29* 3.87 - 5.11 MIL/uL Final  . Hemoglobin 09/27/2013 9.3* 12.0 - 15.0 g/dL Final  . HCT 09/27/2013 26.9* 36.0 - 46.0 % Final  . MCV 09/27/2013 81.8  78.0 - 100.0 fL Final  . MCH 09/27/2013 28.3  26.0 - 34.0 pg Final  . MCHC 09/27/2013 34.6  30.0 - 36.0  g/dL Final  . RDW 09/27/2013 14.9  11.5 - 15.5 % Final  . Platelets 09/27/2013 218  150 - 400 K/uL Final  . Sodium 09/27/2013 138  137 - 147 mEq/L Final  . Potassium 09/27/2013 3.7  3.7 - 5.3 mEq/L Final  . Chloride 09/27/2013 101  96 - 112 mEq/L Final  . CO2 09/27/2013 26  19 - 32 mEq/L Final  . Glucose, Bld 09/27/2013 189* 70 - 99 mg/dL Final  . BUN 09/27/2013 13  6 - 23 mg/dL Final  . Creatinine, Ser 09/27/2013 0.77  0.50 - 1.10 mg/dL Final  . Calcium 09/27/2013 9.1  8.4 - 10.5 mg/dL Final  . GFR calc non Af Amer 09/27/2013 81* >90 mL/min Final  . GFR calc Af Amer 09/27/2013 >90  >90 mL/min Final   Comment: (NOTE)                          The eGFR has been calculated using the CKD EPI equation.                          This calculation has not been validated in all clinical situations.                          eGFR's persistently <90 mL/min signify possible Chronic Kidney                          Disease.  . Glucose-Capillary 09/26/2013 346* 70 - 99 mg/dL Final  . Glucose-Capillary 09/26/2013 293* 70 - 99 mg/dL Final  . Glucose-Capillary 09/26/2013 224* 70 - 99 mg/dL Final  . Glucose-Capillary 09/26/2013 254* 70 - 99 mg/dL Final  . Glucose-Capillary 09/27/2013 188* 70 - 99 mg/dL Final  . Glucose-Capillary 09/27/2013 198* 70 - 99 mg/dL Final  . WBC 09/28/2013 8.9  4.0 - 10.5 K/uL Final  . RBC 09/28/2013 2.96* 3.87 - 5.11 MIL/uL Final  . Hemoglobin 09/28/2013 8.4* 12.0 - 15.0 g/dL Final  . HCT 09/28/2013 24.2* 36.0 - 46.0 % Final  . MCV 09/28/2013 81.8  78.0 - 100.0 fL Final  . MCH 09/28/2013 28.4  26.0 - 34.0 pg Final  .  MCHC 09/28/2013 34.7  30.0 - 36.0 g/dL Final  . RDW 09/28/2013 14.9  11.5 - 15.5 % Final  . Platelets 09/28/2013 206  150 - 400 K/uL Final  . Glucose-Capillary 09/27/2013 162* 70 - 99 mg/dL Final  . Glucose-Capillary 09/27/2013 167* 70 - 99 mg/dL Final  . Glucose-Capillary 09/28/2013 187* 70 - 99 mg/dL Final  Abstract on 09/19/2013  Component Date Value Ref  Range Status  . HM Mammogram 09/11/2013 no  mammographic evidence of malignancy   Final   [facility] Outpatient Visit on 09/19/2013  Component Date Value Ref Range Status  . aPTT 09/19/2013 31  24 - 37 seconds Final  . WBC 09/19/2013 4.8  4.0 - 10.5 K/uL Final  . RBC 09/19/2013 4.48  3.87 - 5.11 MIL/uL Final  . Hemoglobin 09/19/2013 12.9  12.0 - 15.0 g/dL Final  . HCT 09/19/2013 36.6  36.0 - 46.0 % Final  . MCV 09/19/2013 81.7  78.0 - 100.0 fL Final  . MCH 09/19/2013 28.8  26.0 - 34.0 pg Final  . MCHC 09/19/2013 35.2  30.0 - 36.0 g/dL Final  . RDW 09/19/2013 14.9  11.5 - 15.5 % Final  . Platelets 09/19/2013 317  150 - 400 K/uL Final  . Sodium 09/19/2013 141  137 - 147 mEq/L Final  . Potassium 09/19/2013 3.9  3.7 - 5.3 mEq/L Final  . Chloride 09/19/2013 101  96 - 112 mEq/L Final  . CO2 09/19/2013 29  19 - 32 mEq/L Final  . Glucose, Bld 09/19/2013 146* 70 - 99 mg/dL Final  . BUN 09/19/2013 15  6 - 23 mg/dL Final  . Creatinine, Ser 09/19/2013 0.77  0.50 - 1.10 mg/dL Final  . Calcium 09/19/2013 9.8  8.4 - 10.5 mg/dL Final  . Total Protein 09/19/2013 6.9  6.0 - 8.3 g/dL Final  . Albumin 09/19/2013 3.9  3.5 - 5.2 g/dL Final  . AST 09/19/2013 14  0 - 37 U/L Final  . ALT 09/19/2013 13  0 - 35 U/L Final  . Alkaline Phosphatase 09/19/2013 99  39 - 117 U/L Final  . Total Bilirubin 09/19/2013 0.6  0.3 - 1.2 mg/dL Final  . GFR calc non Af Amer 09/19/2013 81* >90 mL/min Final  . GFR calc Af Amer 09/19/2013 >90  >90 mL/min Final   Comment: (NOTE)                          The eGFR has been calculated using the CKD EPI equation.                          This calculation has not been validated in all clinical situations.                          eGFR's persistently <90 mL/min signify possible Chronic Kidney                          Disease.  Marland Kitchen Prothrombin Time 09/19/2013 12.8  11.6 - 15.2 seconds Final  . INR 09/19/2013 0.98  0.00 - 1.49 Final  . MRSA, PCR 09/19/2013 NEGATIVE   NEGATIVE Final  . Staphylococcus aureus 09/19/2013 POSITIVE* NEGATIVE Final   Comment:  The Xpert SA Assay (FDA                          approved for NASAL specimens                          in patients over 39 years of age),                          is one component of                          a comprehensive surveillance                          program.  Test performance has                          been validated by [employer] for patients greater                          than or equal to 65 year old.                          It is not intended                          to diagnose infection nor to                          guide or monitor treatment.  . Color, Urine 09/19/2013 YELLOW  YELLOW Final  . APPearance 09/19/2013 CLOUDY* CLEAR Final  . Specific Gravity, Urine 09/19/2013 1.022  1.005 - 1.030 Final  . pH 09/19/2013 7.0  5.0 - 8.0 Final  . Glucose, UA 09/19/2013 NEGATIVE  NEGATIVE mg/dL Final  . Hgb urine dipstick 09/19/2013 NEGATIVE  NEGATIVE Final  . Bilirubin Urine 09/19/2013 NEGATIVE  NEGATIVE Final  . Ketones, ur 09/19/2013 NEGATIVE  NEGATIVE mg/dL Final  . Protein, ur 09/19/2013 NEGATIVE  NEGATIVE mg/dL Final  . Urobilinogen, UA 09/19/2013 0.2  0.0 - 1.0 mg/dL Final  . Nitrite 09/19/2013 NEGATIVE  NEGATIVE Final  . Leukocytes, UA 09/19/2013 MODERATE* NEGATIVE Final  . Squamous Epithelial / LPF 09/19/2013 FEW* RARE Final  . WBC, UA 09/19/2013 7-10  <3 WBC/hpf Final  . RBC / HPF 09/19/2013 0-2  <3 RBC/hpf Final  . Bacteria, UA 09/19/2013 FEW* RARE Final  . Casts 09/19/2013 HYALINE CASTS* NEGATIVE Final  . Urine-Other 09/19/2013 MUCOUS PRESENT   Final     X-Rays:Dg Chest 2 View  09/19/2013   CLINICAL DATA:  Preop right knee replacement  EXAM: CHEST  2 VIEW  COMPARISON:  None.  FINDINGS: The heart size and mediastinal contours are within normal limits. Both lungs are clear. The visualized skeletal structures are  unremarkable.  IMPRESSION: No active cardiopulmonary disease.   Electronically Signed   By: Kathreen Devoid   On: 09/19/2013 13:53    EKG: Orders placed in visit on 01/17/13  . EKG 12-LEAD     Hospital Course: Angela Chambers is a  73 y.o. who was admitted to [facility]. They were brought to the operating room on 09/25/2013 and underwent Procedure(s): RIGHT TOTAL KNEE ARTHROPLASTY.  Patient tolerated the procedure well and was later transferred to the recovery room and then to the orthopaedic floor for postoperative care.  They were given PO and IV analgesics for pain control following their surgery.  They were given 24 hours of postoperative antibiotics of  Anti-infectives   Start     Dose/Rate Route Frequency Ordered Stop   09/25/13 1700  ceFAZolin (ANCEF) IVPB 2 g/50 mL premix     2 g 100 mL/hr over 30 Minutes Intravenous Every 6 hours 09/25/13 1351 09/25/13 2336   09/25/13 0718  ceFAZolin (ANCEF) IVPB 2 g/50 mL premix     2 g 100 mL/hr over 30 Minutes Intravenous On call to O.R. 09/25/13 4098 09/25/13 1053     and started on DVT prophylaxis in the form of Xarelto.   PT and OT were ordered for total joint protocol.  Discharge planning consulted to help with postop disposition and equipment needs.  Patient had a decent night on the evening of surgery.  They started to get up OOB with therapy on day one. Hemovac drain was pulled without difficulty.  Continued to work with therapy into day two.  Dressing was changed on day two and the incision was healing well.  She need one more day of therapy since she had not met all her goals. By day three, the patient had progressed with therapy and meeting their goals.  Incision was healing well.  Patient was seen in rounds on day three and was ready to go home.   Discharge Medications: Prior to Admission medications   Medication Sig Start Date End Date Taking? Authorizing Provider  aspirin 81 MG tablet Take 81 mg by mouth daily.   Yes  Historical Provider, MD  atorvastatin (LIPITOR) 20 MG tablet Take 20 mg by mouth daily.   Yes Historical Provider, MD  Coenzyme Q10 (CO Q 10 PO) Take 1 tablet by mouth daily.   Yes Historical Provider, MD  losartan (COZAAR) 50 MG tablet Take 50 mg by mouth every morning.   Yes Historical Provider, MD  metFORMIN (GLUCOPHAGE) 500 MG tablet Take 1 tablet (500 mg total) by mouth 2 (two) times daily with a meal. 11/24/12  Yes Barton Fanny, MD  metoprolol succinate (TOPROL-XL) 25 MG 24 hr tablet Take 25 mg by mouth every morning.   Yes Historical Provider, MD  Multiple Vitamin (MULTIVITAMIN WITH MINERALS) TABS tablet Take 1 tablet by mouth daily.   Yes Historical Provider, MD  naproxen sodium (ANAPROX) 220 MG tablet Take 220 mg by mouth 2 (two) times daily as needed (pain).   Yes Historical Provider, MD  Travoprost, BAK Free, (TRAVATAN) 0.004 % SOLN ophthalmic solution Place 1 drop into both eyes at bedtime.   Yes Historical Provider, MD  methocarbamol (ROBAXIN) 500 MG tablet Take 1 tablet (500 mg total) by mouth every 6 (six) hours as needed for muscle spasms. 09/27/13   Alexzandrew Perkins, PA-C  oxyCODONE (OXY IR/ROXICODONE) 5 MG immediate release tablet Take 1-2 tablets (5-10 mg total) by mouth every 3 (three) hours as needed for moderate pain, severe pain or breakthrough pain. 09/27/13   Alexzandrew Dara Lords, PA-C  rivaroxaban (XARELTO) 10 MG TABS tablet Take 1 tablet (10 mg total) by mouth daily with breakfast. Take Xarelto for two and a half more weeks, then discontinue Xarelto. Once the patient has completed the  Xarelto, they may resume the 81 mg Aspirin. 09/27/13   Alexzandrew Perkins, PA-C  traMADol (ULTRAM) 50 MG tablet Take 1-2 tablets (50-100 mg total) by mouth every 6 (six) hours as needed (mild to moderate pain). 09/27/13   Alexzandrew Dara Lords, PA-C    Discharge home with home health  Diet - Cardiac diet and Diabetic diet  Follow up - in 2 weeks  Activity - WBAT  Disposition - Home    Condition Upon Discharge - Pending  D/C Meds - See DC Summary  DVT Prophylaxis - Xarelto        Discharge Orders   Future Appointments Provider Department Dept Phone   12/21/2013 10:45 AM Barton Fanny, MD [facility] [phone]   Future Orders Complete By Expires   Call MD / Call 911  As directed    Change dressing  As directed    Constipation Prevention  As directed    Diet - low sodium heart healthy  As directed    Diet Carb Modified  As directed    Discharge instructions  As directed    Do not put a pillow under the knee. Place it under the heel.  As directed    Do not sit on low chairs, stoools or toilet seats, as it may be difficult to get up from low surfaces  As directed    Driving restrictions  As directed    Increase activity slowly as tolerated  As directed    Lifting restrictions  As directed    Patient may shower  As directed    TED hose  As directed    Weight bearing as tolerated  As directed    Questions:     Laterality:     Extremity:         Medication List    STOP taking these medications       aspirin 81 MG tablet     CO Q 10 PO     multivitamin with minerals Tabs tablet     naproxen sodium 220 MG tablet  Commonly known as:  ANAPROX      TAKE these medications       atorvastatin 20 MG tablet  Commonly known as:  LIPITOR  Take 20 mg by mouth daily.     losartan 50 MG tablet  Commonly known as:  COZAAR  Take 50 mg by mouth every morning.     metFORMIN 500 MG tablet  Commonly known as:  GLUCOPHAGE  Take 1 tablet (500 mg total) by mouth 2 (two) times daily with a meal.     methocarbamol 500 MG tablet  Commonly known as:  ROBAXIN  Take 1 tablet (500 mg total) by mouth every 6 (six) hours as needed for muscle spasms.     metoprolol succinate 25 MG 24 hr tablet  Commonly known as:  TOPROL-XL  Take 25 mg by mouth every morning.     oxyCODONE 5 MG immediate release tablet  Commonly known as:  Oxy IR/ROXICODONE   Take 1-2 tablets (5-10 mg total) by mouth every 3 (three) hours as needed for moderate pain, severe pain or breakthrough pain.     rivaroxaban 10 MG Tabs tablet  Commonly known as:  XARELTO  - Take 1 tablet (10 mg total) by mouth daily with breakfast. Take Xarelto for two and a half more weeks, then discontinue Xarelto.  - Once the patient has completed the Xarelto, they may resume the 81 mg Aspirin.  traMADol 50 MG tablet  Commonly known as:  ULTRAM  Take 1-2 tablets (50-100 mg total) by mouth every 6 (six) hours as needed (mild to moderate pain).     Travoprost (BAK Free) 0.004 % Soln ophthalmic solution  Commonly known as:  TRAVATAN  Place 1 drop into both eyes at bedtime.       Follow-up Information   Follow up with [facility]. (home health physical therapy)    Contact information:   [address] [place] [address] [phone]       Follow up with Gearlean Alf, MD. Schedule an appointment as soon as possible for a visit on 10/10/2013. (Call for appointment time on Tuesday May 12th.)    Specialty:  Orthopedic Surgery   Contact information:   [address] [place] [address] [phone]       Signed: Arlee Muslim 10/12/2013, 11:55 AM

## 2013-09-27 NOTE — Progress Notes (Signed)
Physical Therapy Treatment Patient Details Name: Angela Chambers MRN: [id number] DOB: [birth year] Today's Date: 2013/10/13    History of Present Illness R TKA    PT Comments    Progressing well.  Hoping for d/c tomorrow.  Follow Up Recommendations  Home health PT     Equipment Recommendations  None recommended by PT    Recommendations for Other Services OT consult     Precautions / Restrictions Precautions Precautions: Fall Restrictions Weight Bearing Restrictions: No Other Position/Activity Restrictions: WBAT    Mobility  Bed Mobility Overal bed mobility: Needs Assistance Bed Mobility: Supine to Sit;Sit to Supine     Supine to sit: Min assist Sit to supine: Min assist   General bed mobility comments: for RLE  Transfers Overall transfer level: Needs assistance Equipment used: Rolling walker (2 wheeled) Transfers: Sit to/from Stand Sit to Stand: Min assist         General transfer comment: verbal cues for hand placement and R LE management.   Ambulation/Gait Ambulation/Gait assistance: Min assist;Min guard Ambulation Distance (Feet): 75 Feet (and 30) Assistive device: Rolling walker (2 wheeled) Gait Pattern/deviations: Step-to pattern;Decreased step length - right;Decreased step length - left;Shuffle;Trunk flexed Gait velocity: decr   General Gait Details: cues for sequence, posture and psition from RW   Stairs Stairs: Yes Stairs assistance: Min assist Stair Management: No rails;One rail Left;Step to pattern;Forwards;With walker;With crutches Number of Stairs: 4 General stair comments: cues for sequence and crutch/RW/foot placement. 4 steps with crutch and rail; single step fwd and bkwd with RW  Wheelchair Mobility    Modified Rankin (Stroke Patients Only)       Balance                                    Cognition Arousal/Alertness: Awake/alert Behavior During Therapy: WFL for tasks assessed/performed Overall Cognitive  Status: Within Functional Limits for tasks assessed                      Exercises      General Comments        Pertinent Vitals/Pain 4/10; premed, ice packs provided    Home Living                      Prior Function            PT Goals (current goals can now be found in the care plan section) Acute Rehab PT Goals Patient Stated Goal: be independent PT Goal Formulation: With patient Time For Goal Achievement: 10/03/13 Potential to Achieve Goals: Good Progress towards PT goals: Progressing toward goals    Frequency  7X/week    PT Plan Current plan remains appropriate    Co-evaluation             End of Session Equipment Utilized During Treatment: Gait belt;Right knee immobilizer Activity Tolerance: Patient tolerated treatment well Patient left: in bed;with call bell/phone within reach     Time: 1416-1444 PT Time Calculation (min): 28 min  Charges:  $Gait Training: 8-22 mins $Therapeutic Activity: 8-22 mins                    G Codes:      Mathis Fare 10/13/13, 5:41 PM

## 2013-09-27 NOTE — Evaluation (Signed)
Occupational Therapy Evaluation Patient Details Name: Angela Chambers MRN: [id number] DOB: 14-Dec-1940 Today's Date: 09/27/2013    History of Present Illness R TKA   Clinical Impression   Pt doing well. She practiced up to 3in1 and also with clothing management/hygiene. Educated her on shower transfer and issued handout and reviewed with her. Discussed AE options but pt states husband will assist. Demonstrated how to don KI and when to wear for pt and spouse. Discussed sequence for LB dressing also. Will benefit from continued OT while on acute to address ADL tasks.    Follow Up Recommendations  No OT follow up    Equipment Recommendations  None recommended by OT    Recommendations for Other Services       Precautions / Restrictions Precautions Precautions: Fall Restrictions Weight Bearing Restrictions: No Other Position/Activity Restrictions: WBAT      Mobility Bed Mobility  Transfers Overall transfer level: Needs assistance Equipment used: Rolling walker (2 wheeled) Transfers: Sit to/from Stand Sit to Stand: Min assist         General transfer comment: verbal cues for hand placement and R LE management.     Balance                                            ADL Overall ADL's : Needs assistance/impaired Eating/Feeding: Independent;Sitting   Grooming: Wash/dry hands;Set up;Sitting   Upper Body Bathing: Set up;Sitting   Lower Body Bathing: Moderate assistance;Sit to/from stand   Upper Body Dressing : Set up;Sitting   Lower Body Dressing: Moderate assistance;Sit to/from stand   Toilet Transfer: Minimal assistance;Ambulation;RW;BSC   Toileting- Clothing Manipulation and Hygiene: Minimal assistance;Sit to/from stand       Functional mobility during ADLs: Minimal assistance;Rolling walker General ADL Comments: Demonstrated shower stall transfers and how spouse can assist. Issued handout with shower transfer information. Pt has a  shower chair and states the walker will go through shower opening. She states the demonstration makes sense and she feels she can manage this at home with assist. Pt needed min verbal cues for hand placement and R LE management. She states spouse will help with LB dressing and she isnt currently interestedin AE use.      Vision                     Perception     Praxis      Pertinent Vitals/Pain 4/10 R knee; reposition, ice     Hand Dominance     Extremity/Trunk Assessment Upper Extremity Assessment Upper Extremity Assessment: Overall WFL for tasks assessed           Communication     Cognition Arousal/Alertness: Awake/alert Behavior During Therapy: WFL for tasks assessed/performed Overall Cognitive Status: Within Functional Limits for tasks assessed                     General Comments       Exercises Exercises: Total Joint     Shoulder Instructions      Home Living                                          Prior Functioning/Environment  OT Diagnosis: Generalized weakness   OT Problem List: Decreased strength;Decreased knowledge of use of DME or AE   OT Treatment/Interventions: Self-care/ADL training;Patient/family education;Therapeutic activities;DME and/or AE instruction    OT Goals(Current goals can be found in the care plan section) Acute Rehab OT Goals Patient Stated Goal: be independent OT Goal Formulation: With patient Time For Goal Achievement: 10/04/13 Potential to Achieve Goals: Good  OT Frequency: Min 2X/week   Barriers to D/C:            Co-evaluation              End of Session Equipment Utilized During Treatment: Gait belt;Right knee immobilizer;Rolling walker  Activity Tolerance: Patient tolerated treatment well Patient left: in chair;with call bell/phone within reach   Time: 1040-1110 OT Time Calculation (min): 30 min Charges:  OT General Charges $OT Visit: 1  Procedure OT Evaluation $Initial OT Evaluation Tier I: 1 Procedure OT Treatments $Self Care/Home Management : 8-22 mins $Therapeutic Activity: 8-22 mins G-Codes:    Alycia Patten Leanora Murin [phone] 09/27/2013, 11:56 AM

## 2013-09-27 NOTE — Progress Notes (Signed)
   Subjective: 2 Days Post-Op Procedure(s) (LRB): RIGHT TOTAL KNEE ARTHROPLASTY (Right) Patient reports pain as mild and moderate.   Patient seen in rounds with Dr. Wynelle Link. Patient is well, but has had some minor complaints of pain in the knee, requiring pain medications Patient is ready to go home later today if she does well with both sessions of therapy. She only walked 5 feet yesterday so need to make sure she improves today before letting her go home.  Objective: Vital signs in last 24 hours: Temp:  [97.9 F (36.6 C)-98.7 F (37.1 C)] 98 F (36.7 C) (04/29 0555) Pulse Rate:  [83-105] 100 (04/29 0555) Resp:  [18-20] 18 (04/29 0555) BP: (106-142)/(68-85) 133/75 mmHg (04/29 0555) SpO2:  [92 %-100 %] 92 % (04/29 0555)  Intake/Output from previous day:  Intake/Output Summary (Last 24 hours) at 09/27/13 0837 Last data filed at 09/27/13 3546  Gross per 24 hour  Intake    960 ml  Output   2075 ml  Net  -1115 ml    Intake/Output this shift: Total I/O In: 240 [P.O.:240] Out: -   Labs:  Recent Labs  09/26/13 0427 09/27/13 0447  HGB 10.4* 9.3*    Recent Labs  09/26/13 0427 09/27/13 0447  WBC 8.4 10.6*  RBC 3.67* 3.29*  HCT 30.0* 26.9*  PLT 247 218    Recent Labs  09/26/13 0427 09/27/13 0447  NA 136* 138  K 4.2 3.7  CL 97 101  CO2 28 26  BUN 10 13  CREATININE 0.66 0.77  GLUCOSE 205* 189*  CALCIUM 8.8 9.1   No results found for this basename: LABPT, INR,  in the last 72 hours  EXAM: General - Patient is Alert, Appropriate and Oriented Extremity - Neurovascular intact Sensation intact distally Dorsiflexion/Plantar flexion intact Incision - clean, dry, no drainage, healing Motor Function - intact, moving foot and toes well on exam.   Assessment/Plan: 2 Days Post-Op Procedure(s) (LRB): RIGHT TOTAL KNEE ARTHROPLASTY (Right) Procedure(s) (LRB): RIGHT TOTAL KNEE ARTHROPLASTY (Right) Past Medical History  Diagnosis Date  . Hyperlipidemia   .  Diabetes mellitus   . Glaucoma   . Fibroids   . Arthritis   . Cataract   . Osteoporosis   . Palpitations     CONTROLLED ON BETA BLOCKER  . Hypertension 07 /2013     WITH EF GREATER THAN 55 % WITH MILD MR BUT NO PROLASPE ,ESSENTIALLY NORMAL DONE TO EVALUATE PALPITATIONS .  Marland Kitchen Obesity (BMI 30.0-34.9)    Principal Problem:   OA (osteoarthritis) of knee Active Problems:   Hyponatremia   Postoperative anemia due to acute blood loss  Estimated body mass index is 33.28 kg/(m^2) as calculated from the following:   Height as of this encounter: 5\' 5"  (1.651 m).   Weight as of this encounter: 90.719 kg (200 lb). Up with therapy Discharge home with home health if meets all goals. Diet - Cardiac diet and Diabetic diet Follow up - in 2 weeks Activity - WBAT Disposition - Home Condition Upon Discharge - Pending D/C Meds - See DC Summary DVT Prophylaxis - Xarelto  Ruford Dudzinski 09/27/2013, 8:37 AM

## 2013-09-27 NOTE — Progress Notes (Signed)
Physical Therapy Treatment Patient Details Name: CRETA DORAME MRN: [id number] DOB: 05/15/1941 Today's Date: 07-Oct-2013    History of Present Illness R TKA    PT Comments    Marked improvement in activity tolerance with decreased nausea.  Pt limited this am by onset lightheadedness  Follow Up Recommendations  Home health PT     Equipment Recommendations  None recommended by PT    Recommendations for Other Services OT consult     Precautions / Restrictions Precautions Precautions: Fall Restrictions Weight Bearing Restrictions: No Other Position/Activity Restrictions: WBAT    Mobility  Bed Mobility Overal bed mobility: Needs Assistance Bed Mobility: Supine to Sit     Supine to sit: Min assist     General bed mobility comments: for RLE  Transfers Overall transfer level: Needs assistance Equipment used: Rolling walker (2 wheeled) Transfers: Sit to/from Stand Sit to Stand: Min assist         General transfer comment: cues for LE management and use of UEs to self assist  Ambulation/Gait Ambulation/Gait assistance: Min assist Ambulation Distance (Feet): 70 Feet Assistive device: Rolling walker (2 wheeled) Gait Pattern/deviations: Step-to pattern;Decreased step length - right;Decreased step length - left;Shuffle;Trunk flexed Gait velocity: decr   General Gait Details: cues for sequence, posture and psition from [employer]            Wheelchair Mobility    Modified Rankin (Stroke Patients Only)       Balance                                    Cognition Arousal/Alertness: Awake/alert Behavior During Therapy: WFL for tasks assessed/performed Overall Cognitive Status: Within Functional Limits for tasks assessed                      Exercises Total Joint Exercises Ankle Circles/Pumps: AROM;Both;10 reps;Supine Quad Sets: AROM;Supine;15 reps;Both Heel Slides: AAROM;Right;Supine;15 reps Straight Leg Raises:  AAROM;Right;Supine;20 reps Goniometric ROM: 40* flex R knee AAROM    General Comments        Pertinent Vitals/Pain 5/10; premed, cold packs provided.    Home Living                      Prior Function            PT Goals (current goals can now be found in the care plan section) Acute Rehab PT Goals Patient Stated Goal: to go upstairs to bedroom PT Goal Formulation: With patient Time For Goal Achievement: 10/03/13 Potential to Achieve Goals: Good Progress towards PT goals: Progressing toward goals    Frequency  7X/week    PT Plan Current plan remains appropriate    Co-evaluation             End of Session Equipment Utilized During Treatment: Gait belt;Right knee immobilizer Activity Tolerance: Patient tolerated treatment well Patient left: in chair;with call bell/phone within reach;with family/visitor present     Time: 7124-5809 PT Time Calculation (min): 27 min  Charges:  $Gait Training: 8-22 mins $Therapeutic Exercise: 8-22 mins                    G Codes:      Mathis Fare 07-Oct-2013, 9:36 AM

## 2013-09-28 LAB — CBC
HCT: 24.2 % — ABNORMAL LOW (ref 36.0–46.0)
HEMOGLOBIN: 8.4 g/dL — AB (ref 12.0–15.0)
MCH: 28.4 pg (ref 26.0–34.0)
MCHC: 34.7 g/dL (ref 30.0–36.0)
MCV: 81.8 fL (ref 78.0–100.0)
Platelets: 206 10*3/uL (ref 150–400)
RBC: 2.96 MIL/uL — ABNORMAL LOW (ref 3.87–5.11)
RDW: 14.9 % (ref 11.5–15.5)
WBC: 8.9 10*3/uL (ref 4.0–10.5)

## 2013-09-28 NOTE — Progress Notes (Signed)
   Subjective: 3 Days Post-Op Procedure(s) (LRB): RIGHT TOTAL KNEE ARTHROPLASTY (Right) Patient reports pain as mild.   Patient seen in rounds for Dr. Wynelle Link.  Doing better today. Patient is well, and has had no acute complaints or problems Patient is ready to go home  Objective: Vital signs in last 24 hours: Temp:  [98.1 F (36.7 C)-98.8 F (37.1 C)] 98.8 F (37.1 C) (04/30 0520) Pulse Rate:  [75-100] 100 (04/30 0520) Resp:  [16-18] 16 (04/30 0520) BP: (125-130)/(73-76) 125/73 mmHg (04/30 0520) SpO2:  [94 %-95 %] 94 % (04/30 0520)  Intake/Output from previous day:  Intake/Output Summary (Last 24 hours) at 09/28/13 0704 Last data filed at 09/28/13 0529  Gross per 24 hour  Intake    600 ml  Output    550 ml  Net     50 ml    Intake/Output this shift:    Labs:  Recent Labs  09/26/13 0427 09/27/13 0447 09/28/13 0428  HGB 10.4* 9.3* 8.4*    Recent Labs  09/27/13 0447 09/28/13 0428  WBC 10.6* 8.9  RBC 3.29* 2.96*  HCT 26.9* 24.2*  PLT 218 206    Recent Labs  09/26/13 0427 09/27/13 0447  NA 136* 138  K 4.2 3.7  CL 97 101  CO2 28 26  BUN 10 13  CREATININE 0.66 0.77  GLUCOSE 205* 189*  CALCIUM 8.8 9.1   No results found for this basename: LABPT, INR,  in the last 72 hours  EXAM: General - Patient is Alert, Appropriate and Oriented Extremity - Neurovascular intact Sensation intact distally Incision - clean, dry, no drainage Motor Function - intact, moving foot and toes well on exam.   Assessment/Plan: 3 Days Post-Op Procedure(s) (LRB): RIGHT TOTAL KNEE ARTHROPLASTY (Right) Procedure(s) (LRB): RIGHT TOTAL KNEE ARTHROPLASTY (Right) Past Medical History  Diagnosis Date  . Hyperlipidemia   . Diabetes mellitus   . Glaucoma   . Fibroids   . Arthritis   . Cataract   . Osteoporosis   . Palpitations     CONTROLLED ON BETA BLOCKER  . Hypertension 07 /2013     WITH EF GREATER THAN 55 % WITH MILD MR BUT NO PROLASPE ,ESSENTIALLY NORMAL DONE TO  EVALUATE PALPITATIONS .  Marland Kitchen Obesity (BMI 30.0-34.9)    Principal Problem:   OA (osteoarthritis) of knee Active Problems:   Hyponatremia   Postoperative anemia due to acute blood loss  Estimated body mass index is 33.28 kg/(m^2) as calculated from the following:   Height as of this encounter: 5\' 5"  (1.651 m).   Weight as of this encounter: 90.719 kg (200 lb).  Discharge home with home health Diet - Cardiac diet and Diabetic diet  Follow up - in 2 weeks  Activity - WBAT  Disposition - Home  Condition Upon Discharge - Pending  D/C Meds - See DC Summary  DVT Prophylaxis - Xarelto   Arlee Muslim, PA-C Orthopaedic Surgery 09/28/2013, 7:04 AM

## 2013-09-28 NOTE — Progress Notes (Signed)
Occupational Therapy Treatment Patient Details Name: Angela Chambers MRN: [id number] DOB: Feb 03, 1941 Today's Date: 09/28/2013    History of present illness R TKA      Follow Up Recommendations  No OT follow up    Equipment Recommendations  None recommended by OT    Recommendations for Other Services      Precautions / Restrictions Precautions Precautions: Fall Restrictions Weight Bearing Restrictions: No Other Position/Activity Restrictions: WBAT       Mobility Bed Mobility Overal bed mobility: Needs Assistance Bed Mobility: Supine to Sit     Supine to sit: Min assist     General bed mobility comments: for RLE  Transfers Overall transfer level: Needs assistance Equipment used: Rolling walker (2 wheeled) Transfers: Sit to/from Stand Sit to Stand: Min guard         General transfer comment: verbal cues for hand placement and R LE management.     Balance                                   ADL                                       Functional mobility during ADLs: Min guard;Rolling walker General ADL Comments: OT demonstrated shower transfer, as well as educated pt on dressing and home safety.  Pt has a tub seat and was able to verbalize safety with ADL activity .        Vision                     Perception     Praxis      Cognition   Behavior During Therapy: [facility] for tasks assessed/performed Overall Cognitive Status: Within Functional Limits for tasks assessed                       Extremity/Trunk Assessment               Exercises Total Joint Exercises Ankle Circles/Pumps: AROM;Both;Supine;20 reps Quad Sets: AROM;Supine;15 reps;Both Heel Slides: AAROM;Right;Supine;20 reps Straight Leg Raises: AAROM;Right;Supine;20 reps   Shoulder Instructions       General Comments        Frequency       Progress Toward Goals  OT Goals(current goals can now be found in the care plan  section)  Progress towards OT goals: Progressing toward goals  Acute Rehab OT Goals Patient Stated Goal: be independent  Plan Discharge plan remains appropriate    Co-evaluation                 End of Session     Activity Tolerance Patient tolerated treatment well   Patient Left in chair;with family/visitor present;with call bell/phone within reach   Nurse Communication          Time: 3500-9381 OT Time Calculation (min): 9 min  Charges: OT General Charges $OT Visit: 1 Procedure OT Treatments $Self Care/Home Management : 8-22 mins  Betsy Pries 09/28/2013, 11:26 AM

## 2013-09-28 NOTE — Progress Notes (Signed)
Physical Therapy Treatment Patient Details Name: Angela Chambers MRN: [id number] DOB: [birth year] Today's Date: Oct 17, 2013    History of Present Illness R TKA    PT Comments    Progressing well and ready for d/c from PT perspective  Follow Up Recommendations  Home health PT     Equipment Recommendations  None recommended by PT    Recommendations for Other Services OT consult     Precautions / Restrictions Precautions Precautions: Fall Restrictions Weight Bearing Restrictions: No Other Position/Activity Restrictions: WBAT    Mobility  Bed Mobility Overal bed mobility: Needs Assistance Bed Mobility: Supine to Sit     Supine to sit: Min assist     General bed mobility comments: for RLE  Transfers Overall transfer level: Needs assistance Equipment used: Rolling walker (2 wheeled) Transfers: Sit to/from Stand Sit to Stand: Min guard         General transfer comment: verbal cues for hand placement and R LE management.   Ambulation/Gait Ambulation/Gait assistance: Min guard;Supervision Ambulation Distance (Feet): 150 Feet Assistive device: Rolling walker (2 wheeled) Gait Pattern/deviations: Step-to pattern;Decreased step length - left;Decreased step length - right;Shuffle;Trunk flexed Gait velocity: decr   General Gait Details: cues for sequence, posture and psition from RW   Stairs Stairs:  (Pt declines, states comfortable with ability on stairs)          Wheelchair Mobility    Modified Rankin (Stroke Patients Only)       Balance                                    Cognition Arousal/Alertness: Awake/alert Behavior During Therapy: WFL for tasks assessed/performed Overall Cognitive Status: Within Functional Limits for tasks assessed                      Exercises Total Joint Exercises Ankle Circles/Pumps: AROM;Both;Supine;20 reps Quad Sets: AROM;Supine;15 reps;Both Heel Slides: AAROM;Right;Supine;20  reps Straight Leg Raises: AAROM;Right;Supine;20 reps    General Comments        Pertinent Vitals/Pain 4/10; premed, ice packs provided    Home Living                      Prior Function            PT Goals (current goals can now be found in the care plan section) Acute Rehab PT Goals Patient Stated Goal: be independent PT Goal Formulation: With patient Time For Goal Achievement: 10/03/13 Potential to Achieve Goals: Good Progress towards PT goals: Progressing toward goals    Frequency  7X/week    PT Plan Current plan remains appropriate    Co-evaluation             End of Session Equipment Utilized During Treatment: Gait belt;Right knee immobilizer Activity Tolerance: Patient tolerated treatment well Patient left: in chair;with call bell/phone within reach     Time: 0843-0920 PT Time Calculation (min): 37 min  Charges:  $Gait Training: 8-22 mins $Therapeutic Exercise: 8-22 mins                    G Codes:      Angela Chambers Oct 17, 2013, 10:26 AM

## 2013-09-29 NOTE — Progress Notes (Signed)
Discharge summary sent to payer through [facility]  

## 2013-10-03 LAB — GLUCOSE, CAPILLARY: Glucose-Capillary: 187 mg/dL — ABNORMAL HIGH (ref 70–99)

## 2013-10-24 ENCOUNTER — Ambulatory Visit (INDEPENDENT_AMBULATORY_CARE_PROVIDER_SITE_OTHER): Payer: 59 | Admitting: Family Medicine

## 2013-10-24 VITALS — BP 108/64 | HR 97 | Temp 98.1°F | Resp 16 | Ht 64.75 in | Wt 192.8 lb

## 2013-10-24 DIAGNOSIS — E119 Type 2 diabetes mellitus without complications: Secondary | ICD-10-CM

## 2013-10-24 DIAGNOSIS — R112 Nausea with vomiting, unspecified: Secondary | ICD-10-CM

## 2013-10-24 LAB — POCT CBC
Granulocyte percent: 67.2 %G (ref 37–80)
HCT, POC: 43.2 % (ref 37.7–47.9)
Hemoglobin: 13.4 g/dL (ref 12.2–16.2)
Lymph, poc: 1.1 (ref 0.6–3.4)
MCH, POC: 27.2 pg (ref 27–31.2)
MCHC: 31 g/dL — AB (ref 31.8–35.4)
MCV: 87.8 fL (ref 80–97)
MID (cbc): 0.2 (ref 0–0.9)
MPV: 8.1 fL (ref 0–99.8)
POC Granulocyte: 2.8 (ref 2–6.9)
POC LYMPH PERCENT: 27.2 %L (ref 10–50)
POC MID %: 5.6 % (ref 0–12)
Platelet Count, POC: 355 10*3/uL (ref 142–424)
RBC: 4.92 M/uL (ref 4.04–5.48)
RDW, POC: 17.4 %
WBC: 4.1 10*3/uL — AB (ref 4.6–10.2)

## 2013-10-24 LAB — COMPREHENSIVE METABOLIC PANEL
ALT: 9 U/L (ref 0–35)
Albumin: 3.7 g/dL (ref 3.5–5.2)
BUN: 11 mg/dL (ref 6–23)
CO2: 29 mEq/L (ref 19–32)
Calcium: 9.7 mg/dL (ref 8.4–10.5)
Chloride: 101 mEq/L (ref 96–112)
Glucose, Bld: 124 mg/dL — ABNORMAL HIGH (ref 70–99)
Potassium: 4.1 mEq/L (ref 3.5–5.3)
Sodium: 137 mEq/L (ref 135–145)
Total Bilirubin: 0.5 mg/dL (ref 0.2–1.2)
Total Protein: 6.1 g/dL (ref 6.0–8.3)

## 2013-10-24 LAB — GLUCOSE, POCT (MANUAL RESULT ENTRY): POC Glucose: 140 mg/dL — AB (ref 70–99)

## 2013-10-24 LAB — COMPREHENSIVE METABOLIC PANEL WITH GFR
AST: 11 U/L (ref 0–37)
Alkaline Phosphatase: 101 U/L (ref 39–117)
Creat: 0.84 mg/dL (ref 0.50–1.10)

## 2013-10-24 MED ORDER — HYDROCODONE-ACETAMINOPHEN 5-325 MG PO TABS: 1.0000 | ORAL_TABLET | Freq: Three times a day (TID) | ORAL | Status: DC | PRN

## 2013-10-24 MED ORDER — ONDANSETRON HCL 4 MG PO TABS: 4.0000 mg | ORAL_TABLET | Freq: Three times a day (TID) | ORAL | Status: DC | PRN

## 2013-10-24 NOTE — Progress Notes (Signed)
Chief Complaint:  Chief Complaint  Patient presents with  . Emesis    when taking pain meds    HPI: Angela Chambers is a 73 y.o. female who is here for  Vomiting after taking oxycodone.  She recently had TKR by Dr Maureen Ralphs, was dc with just oxycodone and has been taking it but had been nauseated with meds. She tried calling their office to see if she can get a different medicationa nd was told she needed to see her PCP. She was also given tramadol 50 mg and also Robaxin. She has been taking all of them but she thinks the plain oxy is making her sick.  She has not tried taking 2 tabs of tramadol and just robaxin without oxycodone.  She does have DM but deneis any hypoglycemia.    Past Medical History  Diagnosis Date  . Hyperlipidemia   . Diabetes mellitus   . Glaucoma   . Fibroids   . Arthritis   . Cataract   . Osteoporosis   . Palpitations     CONTROLLED ON BETA BLOCKER  . Hypertension 07 /2013     WITH EF GREATER THAN 55 % WITH MILD MR BUT NO PROLASPE ,ESSENTIALLY NORMAL DONE TO EVALUATE PALPITATIONS .  Marland Kitchen Obesity (BMI 30.0-34.9)    Past Surgical History  Procedure Laterality Date  . Knee surgery  1984    per pt screws in R knee  . 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 Abdominal hysterectomy  10/18/ 1982    TAH-BSO for fibroids and adenomatous hyperplasia  . Tubal ligation    . 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 Persantine myoview  02/ 2011     SHOWED AN APICAL DEFECT BUT NO REVERSIBILITY THOUGHT TO BE DUE TO BREAST ATTENUATION .  Marland Kitchen Transthoracic echocardiogram  July 2013    EF greater than 55%, mild MR no prolapse.  Normal  . Cpet-met test (cardiopulmonary exercise test)  03/06/2013    Adequate effort at 1.11. Peak VO2 12.3 is 86% in the normal range. Heart rate was just outside the normal range target being 127 beats a minute but this was 86% of projected. She did have an  ischemic pattern after the anterograde threshold with increased heart rate and blunting of stroke volume during the last 3 minutes of exercise, but was asymptomatic.  . Total knee arthroplasty Right 09/25/2013    Procedure: RIGHT TOTAL KNEE ARTHROPLASTY;  Surgeon: Gearlean Alf, MD;  Location: [facility];  Service: Orthopedics;  Laterality: Right;   History   Social History  . Marital Status: Married    Spouse Name: N/A    Number of Children: N/A  . Years of Education: N/A   Social History Main Topics  . Smoking status: Former Smoker -- 0.50 packs/day for 20 years    Types: Cigarettes    Quit date: 07/13/1981  . Smokeless tobacco: Never Used  . Alcohol Use: No  . Drug Use: No  . Sexual Activity: Yes   Other Topics Concern  . None   Social History Narrative   SHE IS MARRIED ,MOTHER OF 4, GRANDMOTHER OF 5 , WITH ONE MORE ON THE WAY. SHE DOES EXERCISE BUT NOW IS LIMITED BECAUSE OF HER KNEE.SHE DOES SOME WALKING BUT SIGNIFICANTLY  DECREASED BECAUSE OF HER KNEE PAIN.  BEFORE THAT SHE WAS VERY  ACTIVE AND GETTING ROUTINE WALKING IN .SHE QUIT SMOKING IN 1980 AND SHE DOES NOT DRINK   Family History  Problem Relation Age of Onset  . Diabetes Mother   . Heart disease Mother   . Diabetes Sister   . Hyperlipidemia Sister   . Diabetes Brother    Allergies  Allergen Reactions  . Lisinopril Cough   Prior to Admission medications   Medication Sig Start Date End Date Taking? Authorizing Provider  atorvastatin (LIPITOR) 20 MG tablet Take 20 mg by mouth daily.   Yes Historical Provider, MD  losartan (COZAAR) 50 MG tablet Take 50 mg by mouth every morning.   Yes Historical Provider, MD  metFORMIN (GLUCOPHAGE) 500 MG tablet Take 1 tablet (500 mg total) by mouth 2 (two) times daily with a meal. 11/24/12  Yes Barton Fanny, MD  methocarbamol (ROBAXIN) 500 MG tablet Take 1 tablet (500 mg total) by mouth every 6 (six) hours as needed for muscle spasms. 09/27/13  Yes Arlee Muslim, PA-C  metoprolol  succinate (TOPROL-XL) 25 MG 24 hr tablet Take 25 mg by mouth every morning.   Yes Historical Provider, MD  oxyCODONE (OXY IR/ROXICODONE) 5 MG immediate release tablet Take 1-2 tablets (5-10 mg total) by mouth every 3 (three) hours as needed for moderate pain, severe pain or breakthrough pain. 09/27/13  Yes Arlee Muslim, PA-C  rivaroxaban (XARELTO) 10 MG TABS tablet Take 1 tablet (10 mg total) by mouth daily with breakfast. Take Xarelto for two and a half more weeks, then discontinue Xarelto. Once the patient has completed the Xarelto, they may resume the 81 mg Aspirin. 09/27/13  Yes Arlee Muslim, PA-C  traMADol (ULTRAM) 50 MG tablet Take 1-2 tablets (50-100 mg total) by mouth every 6 (six) hours as needed (mild to moderate pain). 09/27/13  Yes Arlee Muslim, PA-C  Travoprost, BAK Free, (TRAVATAN) 0.004 % SOLN ophthalmic solution Place 1 drop into both eyes at bedtime.   Yes Historical Provider, MD     ROS: The patient denies fevers, chills, night sweats, unintentional weight loss, chest pain, palpitations, wheezing, dyspnea on exertion,abdominal pain, dysuria, hematuria, melena, numbness, weakness, or tingling.   All other systems have been reviewed and were otherwise negative with the exception of those mentioned in the HPI and as above.    PHYSICAL EXAM: Filed Vitals:   10/24/13 1348  BP: 108/64  Pulse: 97  Temp: 98.1 F (36.7 C)  Resp: 16   Filed Vitals:   10/24/13 1348  Height: 5' 4.75" (1.645 m)  Weight: 192 lb 12.8 oz (87.454 kg)   Body mass index is 32.32 kg/(m^2).  General: Alert, no acute distress HEENT:  Normocephalic, atraumatic, oropharynx patent. EOMI, PERRLA Cardiovascular:  Regular rate and rhythm, no rubs murmurs or gallops.  No Carotid bruits, radial pulse intact. No pedal edema.  Respiratory: Clear to auscultation bilaterally.  No wheezes, rales, or rhonchi.  No cyanosis, no use of accessory musculature GI: No organomegaly, abdomen is soft and non-tender, positive bowel  sounds.  No masses. Skin: No rashes. Neurologic: Facial musculature symmetric. Psychiatric: Patient is appropriate throughout our interaction. Lymphatic: No cervical lymphadenopathy Musculoskeletal: Gait intact.   LABS: Results for orders placed in visit on 10/24/13  POCT CBC      Result Value Ref Range   WBC 4.1 (*) 4.6 - 10.2 K/uL   Lymph, poc 1.1  0.6 - 3.4   POC LYMPH PERCENT 27.2  10 - 50 %L   MID (cbc) 0.2  0 - 0.9  POC MID % 5.6  0 - 12 %M   POC Granulocyte 2.8  2 - 6.9   Granulocyte percent 67.2  37 - 80 %G   RBC 4.92  4.04 - 5.48 M/uL   Hemoglobin 13.4  12.2 - 16.2 g/dL   HCT, POC 43.2  37.7 - 47.9 %   MCV 87.8  80 - 97 fL   MCH, POC 27.2  27 - 31.2 pg   MCHC 31.0 (*) 31.8 - 35.4 g/dL   RDW, POC 17.4     Platelet Count, POC 355  142 - 424 K/uL   MPV 8.1  0 - 99.8 fL  GLUCOSE, POCT (MANUAL RESULT ENTRY)      Result Value Ref Range   POC Glucose 140 (*) 70 - 99 mg/dl     EKG/XRAY:   Primary read interpreted by Dr. Marin Comment at [facility].   ASSESSMENT/PLAN: Encounter Diagnoses  Name Primary?  . Nausea and vomiting Yes  . Diabetes    Most likely medication SE from oxycodone She will try taking tramadol 1-2 pills q6-8 hrs prn If no improvement with pain then can consider Oyxcodone 1/2 tab po q6 hrs prn with food  IF still have SE from oxycodone then will discontinue oxycodone and she can take norco  I have advised her not to take both the norco and the oxycodone, she is to take one or the other but not both.  Constiaption precautiosn given F/u prn, Diabetes is well controlled  Gross sideeffects, risk and benefits, and alternatives of medications d/w patient. Patient is aware that all medications have potential sideeffects and we are unable to predict every sideeffect or drug-drug interaction that may occur.  Glenford Bayley, DO 10/24/2013 6:07 PM

## 2013-12-11 ENCOUNTER — Other Ambulatory Visit: Payer: Self-pay | Admitting: Physician Assistant

## 2013-12-14 ENCOUNTER — Ambulatory Visit: Payer: 59 | Admitting: Family Medicine

## 2013-12-21 ENCOUNTER — Ambulatory Visit (INDEPENDENT_AMBULATORY_CARE_PROVIDER_SITE_OTHER): Payer: 59 | Admitting: Family Medicine

## 2013-12-21 ENCOUNTER — Encounter: Payer: Self-pay | Admitting: Family Medicine

## 2013-12-21 VITALS — BP 156/86 | HR 73 | Temp 98.0°F | Resp 16 | Ht 65.0 in | Wt 188.2 lb

## 2013-12-21 DIAGNOSIS — I10 Essential (primary) hypertension: Secondary | ICD-10-CM

## 2013-12-21 DIAGNOSIS — E119 Type 2 diabetes mellitus without complications: Secondary | ICD-10-CM

## 2013-12-21 MED ORDER — METFORMIN HCL 500 MG PO TABS: 500.0000 mg | ORAL_TABLET | Freq: Two times a day (BID) | ORAL | Status: DC

## 2013-12-21 NOTE — Progress Notes (Signed)
S:  This 73 y.o. AA female is here for follow-up HTN after knee total knee replacement in April w/ Dr. Wynelle Link. She has been released by the orthopedist and is staying active. She is compliant w/ medications w/o adverse effects. No report of fatigue, diaphoresis, CP or tightness, palpitations, edema, HA, dizziness, numbness, weakness or syncope.  Patient Active Problem List   Diagnosis Date Noted  . Hyponatremia 09/26/2013  . Postoperative anemia due to acute blood loss 09/26/2013  . OA (osteoarthritis) of knee 09/25/2013  . DOE (dyspnea on exertion) 01/27/2013  . Chest pain on exertion 01/27/2013  . Obesity (BMI 30.0-34.9)   . Palpitations   . Hypertension 11/30/2011  . Hyperlipidemia 08/25/2011  . DIABETES MELLITUS- TYPE II 02/22/2009  . IRON DEFICIENCY 02/22/2009  . PERSONAL HISTORY OF COLONIC POLYPS 02/22/2009    Prior to Admission medications   Medication Sig Start Date End Date Taking? Authorizing Provider  atorvastatin (LIPITOR) 20 MG tablet Take 20 mg by mouth daily.   Yes Historical Provider, MD  losartan (COZAAR) 50 MG tablet Take 50 mg by mouth every morning.   Yes Historical Provider, MD  metFORMIN (GLUCOPHAGE) 500 MG tablet Take 1 tablet (500 mg total) by mouth 2 (two) times daily with a meal. 12/21/13  Yes Barton Fanny, MD  metoprolol succinate (TOPROL-XL) 25 MG 24 hr tablet Take 25 mg by mouth every morning.   Yes Historical Provider, MD  Travoprost, BAK Free, (TRAVATAN) 0.004 % SOLN ophthalmic solution Place 1 drop into both eyes at bedtime.   Yes Historical Provider, MD  atorvastatin (LIPITOR) 20 MG tablet take 1 tablet by mouth once daily 12/11/13   Mancel Bale, PA-C   Allergies  Allergen Reactions  . Lisinopril Cough    PMHx, Surg Hx, Soc and Fam Hx reviewed.  ROS: As per HPI.  O: Filed Vitals:   12/21/13 1129  BP: 156/86  Pulse: 73  Temp: 98 F (36.7 C)  Resp: 16   GEN: In NAD: WN,WD. HENT: [place]/AT. EOMI w/ clear conj/sclera. Otherwise  unremarkable. COR: RRR. No edema. LUNGS: Unlabored resp. SKIN: W&D; intact w/o diaphoresis. MS: MAEs; gait slightly antalgic. Pt ambulates w/o pain or difficulty. NEURO: A&Ox 3; CNs intact. Nonfocal.  A/P: Essential hypertension- Stable and previous readings have been normal. No medication change. Pt has increased activity since knee replacement.  DIABETES MELLITUS- TYPE II  Last A1c= 6.6%; A1c has been in 6.0- 7.0% range over last 2 years.  Meds ordered this encounter  Medications  . metFORMIN (GLUCOPHAGE) 500 MG tablet    Sig: Take 1 tablet (500 mg total) by mouth 2 (two) times daily with a meal.    Dispense:  180 tablet    Refill:  3

## 2014-01-01 ENCOUNTER — Other Ambulatory Visit: Payer: Self-pay | Admitting: Family Medicine

## 2014-02-28 ENCOUNTER — Encounter: Payer: Self-pay | Admitting: Family Medicine

## 2014-04-06 ENCOUNTER — Other Ambulatory Visit: Payer: Self-pay | Admitting: Physician Assistant

## 2014-04-23 ENCOUNTER — Other Ambulatory Visit: Payer: Self-pay | Admitting: Cardiology

## 2014-04-23 NOTE — Telephone Encounter (Signed)
Rx was sent to pharmacy electronically. 

## 2014-05-03 ENCOUNTER — Encounter: Payer: Self-pay | Admitting: Family Medicine

## 2014-05-03 ENCOUNTER — Ambulatory Visit (INDEPENDENT_AMBULATORY_CARE_PROVIDER_SITE_OTHER): Payer: 59 | Admitting: Family Medicine

## 2014-05-03 VITALS — BP 126/88 | HR 75 | Temp 98.0°F | Resp 16 | Ht 64.75 in | Wt 192.4 lb

## 2014-05-03 DIAGNOSIS — E785 Hyperlipidemia, unspecified: Secondary | ICD-10-CM

## 2014-05-03 DIAGNOSIS — I10 Essential (primary) hypertension: Secondary | ICD-10-CM

## 2014-05-03 DIAGNOSIS — E119 Type 2 diabetes mellitus without complications: Secondary | ICD-10-CM

## 2014-05-03 DIAGNOSIS — M25551 Pain in right hip: Secondary | ICD-10-CM

## 2014-05-03 LAB — LIPID PANEL
CHOL/HDL RATIO: 2.5 ratio
Cholesterol: 159 mg/dL (ref 0–200)
HDL: 63 mg/dL (ref >39)
LDL Cholesterol: 83 mg/dL (ref 0–99)
Triglycerides: 66 mg/dL (ref <150)
VLDL: 13 mg/dL (ref 0–40)

## 2014-05-03 LAB — BASIC METABOLIC PANEL WITH GFR
BUN: 18 mg/dL (ref 6–23)
CO2: 25 mEq/L (ref 19–32)
Calcium: 9.2 mg/dL (ref 8.4–10.5)
Chloride: 106 mEq/L (ref 96–112)
Creat: 0.72 mg/dL (ref 0.50–1.10)
GFR, EST NON AFRICAN AMERICAN: 83 mL/min
GFR, Est African American: >89 mL/min
GLUCOSE: 129 mg/dL — AB (ref 70–99)
POTASSIUM: 4.1 meq/L (ref 3.5–5.3)
Sodium: 141 mEq/L (ref 135–145)

## 2014-05-03 LAB — POCT GLYCOSYLATED HEMOGLOBIN (HGB A1C): Hemoglobin A1C: 6.8

## 2014-05-03 LAB — ALT: ALT: 10 U/L (ref 0–35)

## 2014-05-03 MED ORDER — ATORVASTATIN CALCIUM 20 MG PO TABS: ORAL_TABLET | ORAL | Status: DC

## 2014-05-03 MED ORDER — LOSARTAN POTASSIUM 50 MG PO TABS: 50.0000 mg | ORAL_TABLET | Freq: Every day | ORAL | Status: DC

## 2014-05-03 NOTE — Progress Notes (Signed)
Quick Note:  Please notify pt that results are normal.   Provide pt with copy of labs. ______ 

## 2014-05-03 NOTE — Progress Notes (Signed)
Subjective:    Patient ID: Angela Chambers, female    DOB: 1940-11-07, 73 y.o.   MRN: [id number]  HPI  This 73 y.o. AA female is here for DM and HTN follow-up; She is compliant w/ medications w/o adverse effects. Home BP monitoring shows readings 120-140/ 70-80. Pt denies fatigue, vision impairment, CP or tightness, palpitations, SOB or DOE, abd pain, HA, dizziness, numbness, weakness or syncope.  Type II DM- FSBS in low 100s; denies hypoglycemia. Tries to be consistent w/ lifestyle routine. Has managed to lose 5+ lbs since beginning of year.  Arthritis- Pt is s/p R knee surgery w/ excellent recovery. She has intermittent R hip discomfort, not associated w/ any particular movement and not radicular. Exercises at [employer] classes w/o problem. No pain when arising from seated position, climbing steps or getting in/out of car. Pain does not interrupt sleep. She denies LBP.  Patient Active Problem List   Diagnosis Date Noted  . Hyponatremia 09/26/2013  . Postoperative anemia due to acute blood loss 09/26/2013  . OA (osteoarthritis) of knee 09/25/2013  . DOE (dyspnea on exertion) 01/27/2013  . Chest pain on exertion 01/27/2013  . Obesity (BMI 30.0-34.9)   . Palpitations   . Hypertension 11/30/2011  . Hyperlipidemia 08/25/2011  . DIABETES MELLITUS- TYPE II 02/22/2009  . IRON DEFICIENCY 02/22/2009  . PERSONAL HISTORY OF COLONIC POLYPS 02/22/2009    Prior to Admission medications   Medication Sig Start Date End Date Taking? Authorizing Provider  aspirin 81 MG tablet Take 81 mg by mouth daily.   Yes Historical Provider, MD  atorvastatin (LIPITOR) 20 MG tablet TAKE 1 TABLET BY MOUTH ONCE DAILY   Yes Barton Fanny, MD  losartan (COZAAR) 50 MG tablet Take 1 tablet (50 mg total) by mouth daily.   Yes Barton Fanny, MD  metFORMIN (GLUCOPHAGE) 500 MG tablet Take 1 tablet (500 mg total) by mouth 2 (two) times daily with a meal. 12/21/13  Yes Barton Fanny, MD    metoprolol succinate (TOPROL-XL) 25 MG 24 hr tablet take 1 tablet by mouth once daily 01/01/14  Yes Barton Fanny, MD  Multiple Vitamin (MULTIVITAMIN) tablet Take 1 tablet by mouth daily.   Yes Historical Provider, MD  OVER THE COUNTER MEDICATION Co Q 10 taking once daily   Yes Historical Provider, MD  PRESCRIPTION MEDICATION Timolol 0.05 % taking 1 drop to each eye every am   Yes Historical Provider, MD  Travoprost, BAK Free, (TRAVATAN) 0.004 % SOLN ophthalmic solution Place 1 drop into both eyes at bedtime.   Yes Historical Provider, MD    History   Social History  . Marital Status: Married    Spouse Name: N/A    Number of Children: N/A  . Years of Education: N/A   Occupational History  . Not on file.   Social History Main Topics  . Smoking status: Former Smoker -- 0.50 packs/day for 20 years    Types: Cigarettes    Quit date: 07/13/1981  . Smokeless tobacco: Never Used  . Alcohol Use: No  . Drug Use: No  . Sexual Activity: Yes   Other Topics Concern  . Not on file   Social History Narrative   SHE IS MARRIED ,MOTHER OF 4, GRANDMOTHER OF 6. SHE DOES EXERCISE BUT SOMEWHAT LIMITED BECAUSE OF HER KNEE (S/P SURGERY 2015).   SHE QUIT SMOKING IN 1980 AND SHE DOES NOT DRINK    Review of Systems  Constitutional: Negative.   Eyes: Negative  for visual disturbance.  Respiratory: Negative.   Cardiovascular: Negative.   Gastrointestinal: Negative.   Endocrine: Negative.   Musculoskeletal: Positive for arthralgias. Negative for myalgias, back pain and gait problem.  Neurological: Negative.   Psychiatric/Behavioral: Negative.        Objective:   Physical Exam  Constitutional: She is oriented to person, place, and time. She appears well-developed and well-nourished. No distress.  HENT:  Head: Normocephalic and atraumatic.  Right Ear: External ear normal.  Left Ear: External ear normal.  Nose: Nose normal.  Mouth/Throat: Oropharynx is clear and moist.  Eyes:  Conjunctivae and EOM are normal. Pupils are equal, round, and reactive to light. No scleral icterus.  Neck: Normal range of motion. Neck supple.  Cardiovascular: Normal rate and regular rhythm.   Pulmonary/Chest: Effort normal. No respiratory distress.  Musculoskeletal: She exhibits no edema.       Right hip: Normal. She exhibits normal range of motion, normal strength, no tenderness, no bony tenderness and no deformity.       Left hip: Normal. She exhibits normal range of motion, normal strength, no tenderness, no bony tenderness and no deformity.       Lumbar back: Normal.  Neurological: She is alert and oriented to person, place, and time. She has normal strength. She displays no atrophy. No cranial nerve deficit or sensory deficit. She exhibits normal muscle tone. Coordination and gait normal.  Skin: Skin is warm and dry. She is not diaphoretic.  Psychiatric: She has a normal mood and affect. Her behavior is normal. Judgment and thought content normal.  Nursing note and vitals reviewed.   Results for orders placed or performed in visit on 05/03/14  POCT glycosylated hemoglobin (Hb A1C)  Result Value Ref Range   Hemoglobin A1C 6.8        Assessment & Plan:  Diabetes mellitus without complication - stable and well controlled; no changes. Maintain consistent meal planning and activity level. Continue to work on weight loss. Plan: [facility] Diabetes Foot Exam, POCT glycosylated hemoglobin (Hb A1C), ALT  Essential hypertension, benign - Stable on current medications. Follow-up with Dr. Ellyn Hack as scheduled. Plan: BASIC METABOLIC PANEL WITH GFR, ALT  Hyperlipidemia - Plan: Lipid panel, ALT  Lateral pain of right hip- Suspect mild degenerative changes associated with knee problem and gait disturbance prior to surgery. Symptomatic treatment for now. RTC if condition worsens. Consider xray (not indicated today w/ normal exam).  Meds ordered this encounter  Medications  . losartan (COZAAR) 50 MG  tablet    Sig: Take 1 tablet (50 mg total) by mouth daily.    Dispense:  30 tablet    Refill:  0  . atorvastatin (LIPITOR) 20 MG tablet    Sig: TAKE 1 TABLET BY MOUTH ONCE DAILY    Dispense:  30 tablet    Refill:  11

## 2014-06-07 ENCOUNTER — Encounter: Payer: Self-pay | Admitting: Cardiology

## 2014-06-07 ENCOUNTER — Ambulatory Visit (INDEPENDENT_AMBULATORY_CARE_PROVIDER_SITE_OTHER): Payer: Medicare Other | Admitting: Cardiology

## 2014-06-07 VITALS — BP 132/84 | HR 82 | Ht 65.0 in | Wt 194.6 lb

## 2014-06-07 DIAGNOSIS — I10 Essential (primary) hypertension: Secondary | ICD-10-CM

## 2014-06-07 DIAGNOSIS — R079 Chest pain, unspecified: Secondary | ICD-10-CM

## 2014-06-07 DIAGNOSIS — R002 Palpitations: Secondary | ICD-10-CM

## 2014-06-07 DIAGNOSIS — E785 Hyperlipidemia, unspecified: Secondary | ICD-10-CM

## 2014-06-07 DIAGNOSIS — E669 Obesity, unspecified: Secondary | ICD-10-CM

## 2014-06-07 NOTE — Patient Instructions (Signed)
NO CHANGES IN MEDICATION  Your physician wants you to follow-up in [place]. You will receive a reminder letter in the mail two months in advance. If you don't receive a letter, please call our office to schedule the follow-up appointment.

## 2014-06-09 ENCOUNTER — Encounter: Payer: Self-pay | Admitting: Cardiology

## 2014-06-09 NOTE — Assessment & Plan Note (Signed)
Well-controlled on beta blocker and ARB.

## 2014-06-09 NOTE — Assessment & Plan Note (Signed)
The patient understands the need to lose weight with diet and exercise. We have discussed specific strategies for this.  

## 2014-06-09 NOTE — Assessment & Plan Note (Signed)
On statin plus niacin. Most recent labs from December looked great. Lab Results  Component Value Date   CHOL 159 05/03/2014   HDL 63 05/03/2014   LDLCALC 83 05/03/2014   TRIG 66 05/03/2014   CHOLHDL 2.5 05/03/2014

## 2014-06-09 NOTE — Progress Notes (Signed)
PCP: Ellsworth Lennox, MD  Clinic Note: Chief Complaint  Patient presents with  . 12 MONTH VISIT    NO CHEST PAIN , NO SOB , NO SWELLING- GOING ON [place] TRIP [place] FEB [place]   HPI: Angela Chambers is a 74 y.o. female with a PMH below who presents today for delayed one-year followup. Last seen October 2014. Initially evaluated in August of 2014 for her chest discomfort with exertion. She underwent cortical with exercise testing in October crit 14 that did not reveal any significant ischemic evidence. She has been doing well since then. I last saw her following her stress test. Since I last saw her she has had right knee arthroplasty, and is now bothered by right hip arthritis pains.  Past Medical History  Diagnosis Date  . Hyperlipidemia   . Diabetes mellitus   . Glaucoma   . Fibroids   . Arthritis   . Cataract   . Osteoporosis   . Palpitations     CONTROLLED ON BETA BLOCKER  . Hypertension 07 /2013     WITH EF GREATER THAN 55 % WITH MILD MR BUT NO PROLASPE ,ESSENTIALLY NORMAL DONE TO EVALUATE PALPITATIONS .  Marland Kitchen Obesity (BMI 30.0-34.9)     Prior Cardiac Evaluation and Past Surgical History: Procedure Laterality Date  . 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 Persantine myoview  02/ 2011     SHOWED AN APICAL DEFECT BUT NO REVERSIBILITY THOUGHT TO BE DUE TO BREAST ATTENUATION .  Marland Kitchen Transthoracic echocardiogram  July 2013    EF greater than 55%, mild MR no prolapse.  Normal  . Cpet-met test (cardiopulmonary exercise test)  03/06/2013    Adequate effort at 1.11. Peak VO2 12.3 is 86% in the normal range. Heart rate was just outside the normal range target being 127 beats a minute but this was 86% of projected. She did have an ischemic pattern after the anterograde threshold with increased heart rate and blunting of stroke volume during the last 3 minutes of exercise, but was asymptomatic.  .  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 She only notes that if she goes up a steep hill rapidly, she gets short of breath.  She denies any chest tightness or pressure with rest or exertion or resting dyspnea. Normal activity in the normal exertion does not lead to dyspnea. She denies any significant palpitations on a stable dose of metoprolol.  No PND, orthopnea or edema. No palpitations, lightheadedness, dizziness, weakness or syncope/near syncope. No TIA/amaurosis fugax symptoms. No melena, hematochezia, hematuria, or epstaxis. No claudication.  ROS: A comprehensive was performed. Review of Systems  Constitutional: Negative for weight loss.  HENT: Negative for nosebleeds.   Respiratory: Negative for cough and shortness of breath.   Cardiovascular: Negative for claudication.  Gastrointestinal: Negative for blood in stool and melena.  Genitourinary: Negative for hematuria.  Musculoskeletal: Positive for joint pain (  rright knee and hip).  Neurological: Negative for dizziness.  Psychiatric/Behavioral: Negative for depression and memory loss. The patient does not have insomnia.   All other systems reviewed and are negative.   Current Outpatient Prescriptions on File Prior to Visit  Medication Sig Dispense Refill  . aspirin  81 MG tablet Take 81 mg by mouth daily.    Marland Kitchen atorvastatin (LIPITOR) 20 MG tablet TAKE 1 TABLET BY MOUTH ONCE DAILY 30 tablet 11  . losartan (COZAAR) 50 MG tablet Take 1 tablet (50 mg total) by mouth daily. 30 tablet 0  . metFORMIN (GLUCOPHAGE) 500 MG tablet Take 1 tablet (500 mg total) by mouth 2 (two) times daily with a meal. 180 tablet 3  . metoprolol succinate (TOPROL-XL) 25 MG 24 hr tablet take 1 tablet by mouth once daily 30 tablet 5  . Multiple Vitamin (MULTIVITAMIN)  tablet Take 1 tablet by mouth daily.    Marland Kitchen OVER THE COUNTER MEDICATION Co Q 10 taking once daily    . PRESCRIPTION MEDICATION Timolol 0.05 % taking 1 drop to each eye every am    . Travoprost, BAK Free, (TRAVATAN) 0.004 % SOLN ophthalmic solution Place 1 drop into both eyes at bedtime.     No current facility-administered medications on file prior to visit.   Allergies  Allergen Reactions  . Lisinopril Cough   SOCIAL AND FAMILY HISTORY REVIEWED IN EPIC -- No change; planning to go on [place] to [place].  Wt Readings from Last 3 Encounters:  06/07/14 194 lb 9.6 oz (88.27 kg)  05/03/14 192 lb 6.4 oz (87.272 kg)  12/21/13 188 lb 3.2 oz (85.367 kg)    PHYSICAL EXAM BP 132/84 mmHg  Pulse 82  Ht 5' 5"  (1.651 m)  Wt 194 lb 9.6 oz (88.27 kg)  BMI 32.38 kg/m2 General: Very pleasant healthy-appearing AAF, NAD, A&Ox4; answers questions appropriately. Normal mood and affect. HEENT: NCAT, EOMI, MMM, anicteric sclerae.  Neck: Supple; no LAN,JVD, or carotid bruit.  Heart: RRR, normal S1, S2. No M/R/G. Nondisplaced PMI.  Lungs: CTAB, nonlabored,normal effort, good air movement.  Abdomen: Soft/NT/ND/NABS. No HSM. Mild obesity.  Extremities: No C/C/E, 2+ equal pulses throughout.    Adult ECG Report  Rate: 82 ;  Rhythm: normal sinus rhythm and Normal axis, intervals, durations and voltage  Narrative Interpretation: normal EKG  Recent Labs:  Reviewed  ASSESSMENT / PLAN: Chest pain on exertion No recurrent episodes.  Nonobstructive coronary disease by cath & negative Myoview with relatively normal CPET Test. Monitor  Essential hypertension Well-controlled on beta blocker and ARB.  Hyperlipidemia On statin plus niacin. Most recent labs from December looked great. Lab Results  Component Value Date   CHOL 159 05/03/2014   HDL 63 05/03/2014   LDLCALC 83 05/03/2014   TRIG 66 05/03/2014   CHOLHDL 2.5 05/03/2014     Obesity (BMI 30.0-34.9) The patient  understands the need to lose weight with diet and exercise. We have discussed specific strategies for this.   Palpitations Controlled with low dose BB.    No orders of the defined types were placed in this encounter.   Meds ordered this encounter  Medications  . timolol (TIMOPTIC) 0.5 % ophthalmic solution    Sig: Place into both eyes every morning.    Refill:  1    Followup: 1 yr   Leonie Man, M.D., M.S. Interventional Cardiologist   Pager # [phone]

## 2014-06-09 NOTE — Assessment & Plan Note (Signed)
Controlled with low dose BB.

## 2014-06-09 NOTE — Assessment & Plan Note (Signed)
No recurrent episodes.  Nonobstructive coronary disease by cath & negative Myoview with relatively normal CPET Test. Monitor

## 2014-07-02 ENCOUNTER — Other Ambulatory Visit: Payer: Self-pay | Admitting: Family Medicine

## 2014-07-11 ENCOUNTER — Other Ambulatory Visit: Payer: Self-pay | Admitting: Family Medicine

## 2014-08-13 DIAGNOSIS — H4011X3 Primary open-angle glaucoma, severe stage: Secondary | ICD-10-CM | POA: Diagnosis not present

## 2014-09-13 DIAGNOSIS — Z1231 Encounter for screening mammogram for malignant neoplasm of breast: Secondary | ICD-10-CM | POA: Diagnosis not present

## 2014-09-20 DIAGNOSIS — Z96651 Presence of right artificial knee joint: Secondary | ICD-10-CM | POA: Diagnosis not present

## 2014-09-20 DIAGNOSIS — Z471 Aftercare following joint replacement surgery: Secondary | ICD-10-CM | POA: Diagnosis not present

## 2014-10-15 ENCOUNTER — Encounter: Payer: Self-pay | Admitting: *Deleted

## 2014-10-24 ENCOUNTER — Other Ambulatory Visit: Payer: Self-pay | Admitting: Family Medicine

## 2014-11-03 ENCOUNTER — Other Ambulatory Visit: Payer: Self-pay | Admitting: Family Medicine

## 2014-11-08 ENCOUNTER — Encounter: Payer: Self-pay | Admitting: Family Medicine

## 2014-11-22 ENCOUNTER — Ambulatory Visit (INDEPENDENT_AMBULATORY_CARE_PROVIDER_SITE_OTHER): Payer: Medicare Other | Admitting: Urgent Care

## 2014-11-22 ENCOUNTER — Encounter: Payer: Self-pay | Admitting: Urgent Care

## 2014-11-22 VITALS — BP 144/82 | HR 69 | Temp 97.8°F | Resp 16 | Ht 65.0 in | Wt 188.2 lb

## 2014-11-22 DIAGNOSIS — I10 Essential (primary) hypertension: Secondary | ICD-10-CM

## 2014-11-22 DIAGNOSIS — Z0000 Encounter for general adult medical examination without abnormal findings: Secondary | ICD-10-CM | POA: Diagnosis not present

## 2014-11-22 DIAGNOSIS — E119 Type 2 diabetes mellitus without complications: Secondary | ICD-10-CM

## 2014-11-22 DIAGNOSIS — H409 Unspecified glaucoma: Secondary | ICD-10-CM | POA: Diagnosis not present

## 2014-11-22 DIAGNOSIS — IMO0001 Reserved for inherently not codable concepts without codable children: Secondary | ICD-10-CM

## 2014-11-22 DIAGNOSIS — R61 Generalized hyperhidrosis: Secondary | ICD-10-CM | POA: Diagnosis not present

## 2014-11-22 DIAGNOSIS — E785 Hyperlipidemia, unspecified: Secondary | ICD-10-CM | POA: Diagnosis not present

## 2014-11-22 LAB — COMPREHENSIVE METABOLIC PANEL
ALK PHOS: 81 U/L (ref 39–117)
ALT: 14 U/L (ref 0–35)
AST: 13 U/L (ref 0–37)
Albumin: 4.2 g/dL (ref 3.5–5.2)
BILIRUBIN TOTAL: 0.7 mg/dL (ref 0.2–1.2)
BUN: 17 mg/dL (ref 6–23)
CO2: 28 mEq/L (ref 19–32)
Calcium: 9.5 mg/dL (ref 8.4–10.5)
Chloride: 103 mEq/L (ref 96–112)
Creat: 0.75 mg/dL (ref 0.50–1.10)
Glucose, Bld: 138 mg/dL — ABNORMAL HIGH (ref 70–99)
Potassium: 4.1 mEq/L (ref 3.5–5.3)
SODIUM: 142 meq/L (ref 135–145)
TOTAL PROTEIN: 6.9 g/dL (ref 6.0–8.3)

## 2014-11-22 LAB — LIPID PANEL
CHOL/HDL RATIO: 2.3 ratio
Cholesterol: 177 mg/dL (ref 0–200)
HDL: 76 mg/dL (ref >=46)
LDL Cholesterol: 79 mg/dL (ref 0–99)
TRIGLYCERIDES: 109 mg/dL (ref <150)
VLDL: 22 mg/dL (ref 0–40)

## 2014-11-22 LAB — POCT GLYCOSYLATED HEMOGLOBIN (HGB A1C): Hemoglobin A1C: 7.3

## 2014-11-22 NOTE — Patient Instructions (Addendum)
Keeping You Healthy  Get These Tests  Blood Pressure- Have your blood pressure checked by your healthcare provider at least once a year.  Normal blood pressure is 120/80.  Weight- Have your body mass index (BMI) calculated to screen for obesity.  BMI is a measure of body fat based on height and weight.  You can calculate your own BMI at www.nhlbisupport.com/bmi/  Cholesterol- Have your cholesterol checked every year.  Diabetes- Have your blood sugar checked every year if you have high blood pressure, high cholesterol, a family history of diabetes or if you are overweight.  Pap Test - Have a pap test every 1 to 5 years if you have been sexually active.  If you are older than 65 and recent pap tests have been normal you may not need additional pap tests.  In addition, if you have had a hysterectomy  for benign disease additional pap tests are not necessary.  Mammogram-Yearly mammograms are essential for early detection of breast cancer  Screening for Colon Cancer- Colonoscopy starting at age 50. Screening may begin sooner depending on your family history and other health conditions.  Follow up colonoscopy as directed by your Gastroenterologist.  Screening for Osteoporosis- Screening begins at age 65 with bone density scanning, sooner if you are at higher risk for developing Osteoporosis.  Get these medicines  Calcium with Vitamin D- Your body requires 1200-1500 mg of Calcium a day and 800-1000 IU of Vitamin D a day.  You can only absorb 500 mg of Calcium at a time therefore Calcium must be taken in 2 or 3 separate doses throughout the day.  Hormones- Hormone therapy has been associated with increased risk for certain cancers and heart disease.  Talk to your healthcare provider about if you need relief from menopausal symptoms.  Aspirin- Ask your healthcare provider about taking Aspirin to prevent Heart Disease and Stroke.  Get these Immuniztions  Flu shot- Every fall  Pneumonia shot-  Once after the age of 65; if you are younger ask your healthcare provider if you need a pneumonia shot.  Tetanus- Every ten years.  Zostavax- Once after the age of 60 to prevent shingles.  Take these steps  Don't smoke- Your healthcare provider can help you quit. For tips on how to quit, ask your healthcare provider or go to www.smokefree.gov or call 1-800 QUIT-NOW.  Be physically active- Exercise 5 days a week for a minimum of 30 minutes.  If you are not already physically active, start slow and gradually work up to 30 minutes of moderate physical activity.  Try walking, dancing, bike riding, swimming, etc.  Eat a healthy diet- Eat a variety of healthy foods such as fruits, vegetables, whole grains, low fat milk, low fat cheeses, yogurt, lean meats, chicken, fish, eggs, dried beans, tofu, etc.  For more information go to www.thenutritionsource.org  Dental visit- Brush and floss teeth twice daily; visit your dentist twice a year.  Eye exam- Visit your Optometrist or Ophthalmologist yearly.  Drink alcohol in moderation- Limit alcohol intake to one drink or less a day.  Never drink and drive.  Depression- Your emotional health is as important as your physical health.  If you're feeling down or losing interest in things you normally enjoy, please talk to your healthcare provider.  Seat Belts- can save your life; always wear one  Smoke/Carbon Monoxide detectors- These detectors need to be installed on the appropriate level of your home.  Replace batteries at least once a year.  Violence- If   anyone is threatening or hurting you, please tell your healthcare provider.  Living Will/ Health care power of attorney- Discuss with your healthcare provider and family.   Diabetes and Exercise Exercising regularly is important. It is not just about losing weight. It has many health benefits, such as: 9. Improving your overall fitness, flexibility, and endurance. 10. Increasing your bone  density. 35. Helping with weight control. 12. Decreasing your body fat. 13. Increasing your muscle strength. 14. Reducing stress and tension. 15. Improving your overall health. People with diabetes who exercise gain additional benefits because exercise: 4. Reduces appetite. 5. Improves the body's use of blood sugar (glucose). 6. Helps lower or control blood glucose. 7. Decreases blood pressure. 8. Helps control blood lipids (such as cholesterol and triglycerides). 9. Improves the body's use of the hormone insulin by: 1. Increasing the body's insulin sensitivity. 2. Reducing the body's insulin needs. 10. Decreases the risk for heart disease because exercising: 1. Lowers cholesterol and triglycerides levels. 2. Increases the levels of good cholesterol (such as high-density lipoproteins [HDL]) in the body. 3. Lowers blood glucose levels. YOUR ACTIVITY PLAN  Choose an activity that you enjoy and set realistic goals. Your health care provider or diabetes educator can help you make an activity plan that works for you. Exercise regularly as directed by your health care provider. This includes: 5. Performing resistance training twice a week such as push-ups, sit-ups, lifting weights, or using resistance bands. 6. Performing 150 minutes of cardio exercises each week such as walking, running, or playing sports. 7. Staying active and spending no more than 90 minutes at one time being inactive. Even short bursts of exercise are good for you. Three 10-minute sessions spread throughout the day are just as beneficial as a single 30-minute session. Some exercise ideas include: 12. Taking the dog for a walk. 13. Taking the stairs instead of the elevator. 60. Dancing to your favorite song. 15. Doing an exercise video. 16. Doing your favorite exercise with a friend. RECOMMENDATIONS FOR EXERCISING WITH TYPE 1 OR TYPE 2 DIABETES   Check your blood glucose before exercising. If blood glucose levels are  greater than 240 mg/dL, check for urine ketones. Do not exercise if ketones are present.  Avoid injecting insulin into areas of the body that are going to be exercised. For example, avoid injecting insulin into:  The arms when playing tennis.  The legs when jogging.  Keep a record of:  Food intake before and after you exercise.  Expected peak times of insulin action.  Blood glucose levels before and after you exercise.  The type and amount of exercise you have done.  Review your records with your health care provider. Your health care provider will help you to develop guidelines for adjusting food intake and insulin amounts before and after exercising.  If you take insulin or oral hypoglycemic agents, watch for signs and symptoms of hypoglycemia. They include:  Dizziness.  Shaking.  Sweating.  Chills.  Confusion.  Drink plenty of water while you exercise to prevent dehydration or heat stroke. Body water is lost during exercise and must be replaced.  Talk to your health care provider before starting an exercise program to make sure it is safe for you. Remember, almost any type of activity is better than none. Document Released: 08/08/2003 Document Revised: 10/02/2013 Document Reviewed: 10/25/2012 [facility] Patient Information 2015 [place], [place]. This information is not intended to replace advice given to you by your health care provider. Make sure you discuss any  questions you have with your health care provider.   Diabetes Mellitus and Food It is important for you to manage your blood sugar (glucose) level. Your blood glucose level can be greatly affected by what you eat. Eating healthier foods in the appropriate amounts throughout the day at about the same time each day will help you control your blood glucose level. It can also help slow or prevent worsening of your diabetes mellitus. Healthy eating may even help you improve the level of your blood pressure and reach or  maintain a healthy weight.  HOW CAN FOOD AFFECT ME? Carbohydrates Carbohydrates affect your blood glucose level more than any other type of food. Your dietitian will help you determine how many carbohydrates to eat at each meal and teach you how to count carbohydrates. Counting carbohydrates is important to keep your blood glucose at a healthy level, especially if you are using insulin or taking certain medicines for diabetes mellitus. Alcohol Alcohol can cause sudden decreases in blood glucose (hypoglycemia), especially if you use insulin or take certain medicines for diabetes mellitus. Hypoglycemia can be a life-threatening condition. Symptoms of hypoglycemia (sleepiness, dizziness, and disorientation) are similar to symptoms of having too much alcohol.  If your health care provider has given you approval to drink alcohol, do so in moderation and use the following guidelines: 16. Women should not have more than one drink per day, and men should not have more than two drinks per day. One drink is equal to: 1. 12 oz of beer. 2. 5 oz of wine. 3. 1 oz of hard liquor. 17. Do not drink on an empty stomach. 18. Keep yourself hydrated. Have water, diet soda, or unsweetened iced tea. 19. Regular soda, juice, and other mixers might contain a lot of carbohydrates and should be counted. WHAT FOODS ARE NOT RECOMMENDED? As you make food choices, it is important to remember that all foods are not the same. Some foods have fewer nutrients per serving than other foods, even though they might have the same number of calories or carbohydrates. It is difficult to get your body what it needs when you eat foods with fewer nutrients. Examples of foods that you should avoid that are high in calories and carbohydrates but low in nutrients include: 11. Trans fats (most processed foods list trans fats on the Nutrition Facts label). 12. Regular soda. 13. Juice. 14. Candy. 15. Sweets, such as cake, pie, doughnuts, and  cookies. 16. Fried foods. WHAT FOODS CAN I EAT? Have nutrient-rich foods, which will nourish your body and keep you healthy. The food you should eat also will depend on several factors, including: 8. The calories you need. 9. The medicines you take. 10. Your weight. 11. Your blood glucose level. 12. Your blood pressure level. 13. Your cholesterol level. You also should eat a variety of foods, including: 17. Protein, such as meat, poultry, fish, tofu, nuts, and seeds (lean animal proteins are best). 18. Fruits. 19. Vegetables. 20. Dairy products, such as milk, cheese, and yogurt (low fat is best). 21. Breads, grains, pasta, cereal, rice, and beans. 22. Fats such as olive oil, trans fat-free margarine, canola oil, avocado, and olives. DOES EVERYONE WITH DIABETES MELLITUS HAVE THE SAME MEAL PLAN? Because every person with diabetes mellitus is different, there is not one meal plan that works for everyone. It is very important that you meet with a dietitian who will help you create a meal plan that is just right for you. Document Released: 02/12/2005 Document Revised:  05/23/2013 Document Reviewed: 04/14/2013 [employer] Patient Information 2015 [place], [place]. This information is not intended to replace advice given to you by your health care provider. Make sure you discuss any questions you have with your health care provider.

## 2014-11-22 NOTE — Progress Notes (Signed)
MRN: [id number]  Subjective:   [name] is a 74 y.o. female presenting for annual physical exam.  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 Ellyn Hack, see below   Angela Chambers has DIABETES MELLITUS- TYPE II; PERSONAL HISTORY OF COLONIC POLYPS; Hyperlipidemia; Obesity (BMI 30.0-34.9); Essential hypertension; Palpitations; Chest pain on exertion; OA (osteoarthritis) of knee; and Glaucoma (increased eye pressure) on her problem list.   Diabetes - managed with metformin, blood sugar ranges between 90's - 120's fasting. Eating healthy, walking for exercise, losing weight. ROS as below.  HTN - managed with metoprolol, losartan, BP ranges between 768'T - 157'W systolic. Diet and exercise as above.  HL - managed with atorvastatin, diet and exercise as above.  OA of knee - stable, has had both knees replaced, right side was done 2012, left was done 2015.  Cardiovascular complaints - seeing Dr. Ellyn Hack. Was worked up fully in 07/2014, normal findings, recommended she return in 1 year. Currently, asymptomatic.  Concerns - reports several year history of intermittent sweating, hot flashes. Was previously managed by Dr. Everlene Farrier, she is s/p hysterectomy and was on HRT before Dr. Everlene Farrier took her off of this. She has since had episodic sweating and hot flashes.  Medication refills - per patient does not need refills on her medications for a while.  Social - patient has been married for 52 years (since 1964), had 4 children with her husband, currently lives with him in their own home, states that they are doing very well. One of her sons  is getting married soon, she has been very busy trying to help with their wedding. 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 Does not smoke or drink alcohol. No other drug use.  Angela Chambers has a current medication list which includes the following prescription(s): aspirin, atorvastatin, losartan, metformin, metoprolol succinate, multivitamin, OVER THE COUNTER MEDICATION, PRESCRIPTION MEDICATION, timolol, and travoprost (bak free). She is allergic to lisinopril.  Angela Chambers  has a past medical history of Hyperlipidemia; Diabetes mellitus; Glaucoma; Fibroids; Arthritis; Cataract; Osteoporosis; Palpitations; Hypertension (07 /2013 ); and Obesity (BMI 30.0-34.9). 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  Patient's family history includes Diabetes in her brother, mother, and sister; Heart disease in her mother; Hyperlipidemia in her brother, mother, sister, and sister; Hypertension in her sister.  Immunizations: Influenza-Unspecified 02/23/2014 Pneumococcal Conjugate-13 11/27/2013 Pneumococcal Polysaccharide-23 10/27/2006 Tdap 04/19/2006 Zoster 07/02/2008  ROS  Objective:   Vitals: BP 144/82 mmHg  Pulse 69  Temp(Src) 97.8 F (36.6 C) (Oral)  Resp 16  Ht _0  (1.651 m)  Wt 188 lb 3.2 oz (85.367 kg)  BMI 31.32 kg/m2  SpO2 99%  Wt Readings from Last 3 Encounters:  11/22/14 188 lb 3.2 oz (85.367 kg)  06/07/14 194 lb 9.6 oz (88.27 kg)  05/03/14 192 lb 6.4 oz (87.272 kg)   BP Readings from Last 3 Encounters:  11/22/14 144/82  06/07/14 132/84  05/03/14 126/88   Physical Exam  Constitutional: She  is oriented to person, place, and time. She appears well-developed  and well-nourished.  HENT:  TM's intact bilaterally, no effusions or erythema. Nares patent, nasal turbinates pink and moist. No sinus tenderness. Oropharynx clear, mucous membranes moist, dentition in good repair.  Eyes: Conjunctivae and EOM are normal. Pupils are equal, round, and reactive to light. Right eye exhibits no discharge. Left eye exhibits no discharge. No scleral icterus.  Neck: Normal range of motion. Neck supple. No thyromegaly present.  Cardiovascular: Normal rate, regular rhythm and intact distal pulses.  Exam reveals no gallop and no friction rub.   No murmur heard. Pulmonary/Chest: No respiratory distress. She has no wheezes. She has no rales.  Abdominal: Soft. Bowel sounds are normal. She exhibits no distension and no mass. There is no tenderness.  Musculoskeletal: Normal range of motion. She exhibits no edema or tenderness.  Strength 5/5 throughout.  Lymphadenopathy:    She has no cervical adenopathy.  Neurological: She is alert and oriented to person, place, and time. She has normal reflexes.  Sensation intact in both feet bilaterally.  Skin: Skin is warm and dry. No rash noted. No erythema. No pallor.  Psychiatric: She has a normal mood and affect.   Results for orders placed or performed in visit on 11/22/14 (from the past 24 hour(s))  POCT glycosylated hemoglobin (Hb A1C)     Status: None   Collection Time: 11/22/14  1:43 PM  Result Value Ref Range   Hemoglobin A1C 7.3    Assessment and Plan :   1. Annual physical exam - Stable, discussed healthy lifestyle, diet, exercise, preventative care, vaccinations, and addressed patient's concerns.  - Repeat annual in 1 year  2. Diabetes mellitus without complication - C3K is higher than last visit, 6.8 to 7.3 today, patient left by mistake before I could discuss this with her. I recommended that she continue efforts at dietary modifications and exercise, we'll hold off on medication changes for now but recheck her A1c  in 3 months. Patient agreed.  3. Essential hypertension, benign - Stable, labs pending, continue metoprolol and losartan  4. Hyperlipidemia - Stable, repeat labs today, continue atorvastatin  5. Sweating - Labs pending, benefits of restarting HRT outweigh risks, patient agreed. States that she can tolerate the sweating but wanted to make sure she was okay. I reassured patient and will consider her options s/p lab results  6. Glaucoma (increased eye pressure) - Continue follow up with ophthalmologist  [name], PA-C [facility] [phone] 11/22/2014  1:18 PM

## 2014-11-23 ENCOUNTER — Telehealth: Payer: Self-pay | Admitting: *Deleted

## 2014-11-23 LAB — CBC
HEMATOCRIT: 35.8 % — AB (ref 36.0–46.0)
HEMOGLOBIN: 12.2 g/dL (ref 12.0–15.0)
MCH: 28.1 pg (ref 26.0–34.0)
MCHC: 34.1 g/dL (ref 30.0–36.0)
MCV: 82.5 fL (ref 78.0–100.0)
MPV: 9.9 fL (ref 8.6–12.4)
Platelets: 289 10*3/uL (ref 150–400)
RBC: 4.34 MIL/uL (ref 3.87–5.11)
RDW: 16.3 % — ABNORMAL HIGH (ref 11.5–15.5)
WBC: 4.4 10*3/uL (ref 4.0–10.5)

## 2014-11-23 LAB — MICROALBUMIN, URINE: Microalb, Ur: 1.1 mg/dL (ref <2.0)

## 2014-11-23 LAB — TSH: TSH: 1.307 u[IU]/mL (ref 0.350–4.500)

## 2014-11-23 NOTE — Telephone Encounter (Signed)
Phoned and spoke to [place] @ [facility] and August 13, 2014 report is being faxed to my attention.  Will update health maintenance once received.

## 2014-11-26 NOTE — Telephone Encounter (Signed)
Received DEE dated 08/13/14 - primary open angle glaucoma of both eyes, severe stage - return in 5 months.  Updated health maintenance and sent to med rec for scanning.

## 2014-11-29 ENCOUNTER — Encounter: Payer: 59 | Admitting: Family Medicine

## 2014-12-11 ENCOUNTER — Other Ambulatory Visit: Payer: Self-pay | Admitting: Physician Assistant

## 2014-12-21 ENCOUNTER — Other Ambulatory Visit: Payer: Self-pay | Admitting: Family Medicine

## 2015-02-20 ENCOUNTER — Other Ambulatory Visit: Payer: Self-pay

## 2015-02-20 MED ORDER — METFORMIN HCL 500 MG PO TABS: 500.0000 mg | ORAL_TABLET | Freq: Two times a day (BID) | ORAL | Status: DC

## 2015-02-28 ENCOUNTER — Encounter: Payer: Self-pay | Admitting: Urgent Care

## 2015-02-28 ENCOUNTER — Ambulatory Visit (INDEPENDENT_AMBULATORY_CARE_PROVIDER_SITE_OTHER): Payer: Medicare Other | Admitting: Urgent Care

## 2015-02-28 VITALS — BP 162/100 | HR 82 | Temp 98.2°F | Resp 16 | Ht 65.0 in | Wt 191.6 lb

## 2015-02-28 DIAGNOSIS — E119 Type 2 diabetes mellitus without complications: Secondary | ICD-10-CM | POA: Diagnosis not present

## 2015-02-28 DIAGNOSIS — I10 Essential (primary) hypertension: Secondary | ICD-10-CM

## 2015-02-28 LAB — COMPREHENSIVE METABOLIC PANEL
ALBUMIN: 4.1 g/dL (ref 3.6–5.1)
ALT: 12 U/L (ref 6–29)
AST: 13 U/L (ref 10–35)
Alkaline Phosphatase: 93 U/L (ref 33–130)
BILIRUBIN TOTAL: 0.6 mg/dL (ref 0.2–1.2)
BUN: 17 mg/dL (ref 7–25)
CHLORIDE: 103 mmol/L (ref 98–110)
CO2: 27 mmol/L (ref 20–31)
CREATININE: 0.75 mg/dL (ref 0.60–0.93)
Calcium: 9.8 mg/dL (ref 8.6–10.4)
Glucose, Bld: 126 mg/dL — ABNORMAL HIGH (ref 65–99)
Potassium: 4.5 mmol/L (ref 3.5–5.3)
SODIUM: 140 mmol/L (ref 135–146)
Total Protein: 6.6 g/dL (ref 6.1–8.1)

## 2015-02-28 LAB — HEMOGLOBIN A1C
Hgb A1c MFr Bld: 6.7 % — ABNORMAL HIGH (ref <5.7)
MEAN PLASMA GLUCOSE: 146 mg/dL — AB (ref <117)

## 2015-02-28 MED ORDER — HYDROCHLOROTHIAZIDE 12.5 MG PO TABS: 12.5000 mg | ORAL_TABLET | Freq: Every day | ORAL | Status: DC

## 2015-02-28 NOTE — Progress Notes (Signed)
  MRN: [id number] DOB: 07/15/1940  Subjective:   Angela Chambers is a 74 y.o. female presenting for follow up on DM II.   DM II - managed with metformin 500 mg twice a day. Patient reports that she eats a healthy diet and tries to stay active. She has not had any major medication changes in years. She is concerned that 3 months ago her A1c went up to 7.3. Denies blurred vision, polydipsia, chest pain, shortness of breath, nausea, vomiting, abdominal pain, polyuria, hematuria, lower leg swelling, foot ulcers or wounds won't heal. Her urine microalbumin and cholesterol check occurred on 11/22/2014 and both were normal.   HTN - managed with losartan and metoprolol. Patient has a cardiologist, Dr. Harding. She has yearly visits with him, get an EKG each time and has been normal per patient. Dr. Harding has performed heart catheterization which turned out to be normal as well, last completed 06/2014. Today, her blood pressure is elevated. She reports that the only changes in her environment have been new paint in her home. She denies dizziness, headache, chest pain, diaphoresis, heart racing, palpitations, neck pain, jaw pain. Otherwise review of systems as above. Denies any other aggravating or relieving factors, no other questions or concerns.  Angela Chambers has a current medication list which includes the following prescription(s): aspirin, atorvastatin, losartan, metformin, metoprolol succinate, multivitamin, OVER THE COUNTER MEDICATION, PRESCRIPTION MEDICATION, timolol, travoprost (bak free), and hydrochlorothiazide. Also is allergic to lisinopril.  Angela Chambers  has a past medical history of Hyperlipidemia; Diabetes mellitus; Glaucoma; Fibroids; Arthritis; Cataract; Osteoporosis; Palpitations; Hypertension (07 /2013 ); and Obesity (BMI 30.0-34.9). 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  Objective:   Vitals: BP 162/100 mmHg  Pulse 82  Temp(Src) 98.2 F (36.8 C) (Oral)  Resp 16  Ht 5' 5" (1.651 m)  Wt 191 lb 9.6 oz (86.909 kg)  BMI 31.88 kg/m2  BP Readings from Last 3 Encounters:  02/28/15 162/100  11/22/14 144/82  06/07/14 132/84   Physical Exam  Constitutional: She is oriented to person, place, and time. She appears well-developed and well-nourished.  HENT:  Mouth/Throat: Oropharynx is clear and moist.  Eyes: Conjunctivae are normal. Pupils are equal, round, and reactive to light. No scleral icterus.  Neck: Normal range of motion. Neck supple. No thyromegaly present.  Cardiovascular: Normal rate, regular rhythm and intact distal pulses.  Exam reveals no gallop and no friction rub.   No murmur heard. Pulmonary/Chest: No respiratory distress. She has no wheezes. She has no rales.  Abdominal: Soft. Bowel sounds are normal. She exhibits no distension and no mass. There is no tenderness.  Musculoskeletal: She exhibits no edema.  Neurological: She is alert and oriented to person, place, and time.  Skin: Skin is warm and dry. No rash noted. No erythema. No pallor.   ECG interpretation by Dr. Greene and PA-Mani - normal sinus rhythm.  Assessment and Plan :   1. Type 2 diabetes mellitus without complication - Labs pending, physical exam findings reassuring. We'll consider adding glipizide or increasing metformin depending on A1c level. Patient is to continue healthy diet. Followup in one month.  2. Essential hypertension - Labs pending, started patient on hydrochlorothiazide to help with her blood pressure. She is to continue losartan and metoprolol as prescribed by Dr.   Ellyn Hack. She is to followup with me in one month.  Angela Eagles, PA-C [facility] [phone] 02/28/2015 2:35 PM

## 2015-02-28 NOTE — Patient Instructions (Addendum)
Diabetes and Standards of Medical Care Diabetes is complicated. You may find that your diabetes team includes a dietitian, nurse, diabetes educator, eye doctor, and more. To help everyone know what is going on and to help you get the care you deserve, the following schedule of care was developed to help keep you on track. Below are the tests, exams, vaccines, medicines, education, and plans you will need. HbA1c test This test shows how well you have controlled your glucose over the past 2-3 months. It is used to see if your diabetes management plan needs to be adjusted.   It is performed at least 2 times a year if you are meeting treatment goals.  It is performed 4 times a year if therapy has changed or if you are not meeting treatment goals. Blood pressure test  This test is performed at every routine medical visit. The goal is less than 140/90 mm Hg for most people, but 130/80 mm Hg in some cases. Ask your health care provider about your goal. Dental exam  Follow up with the dentist regularly. Eye exam  If you are diagnosed with type 1 diabetes as a child, get an exam upon reaching the age of 37 years or older and have had diabetes for 3-5 years. Yearly eye exams are recommended after that initial eye exam.  If you are diagnosed with type 1 diabetes as an adult, get an exam within 5 years of diagnosis and then yearly.  If you are diagnosed with type 2 diabetes, get an exam as soon as possible after the diagnosis and then yearly. Foot care exam  Visual foot exams are performed at every routine medical visit. The exams check for cuts, injuries, or other problems with the feet.  A comprehensive foot exam should be done yearly. This includes visual inspection as well as assessing foot pulses and testing for loss of sensation.  Check your feet nightly for cuts, injuries, or other problems with your feet. Tell your health care provider if anything is not healing. Kidney function test (urine  microalbumin)  This test is performed once a year.  Type 1 diabetes: The first test is performed 5 years after diagnosis.  Type 2 diabetes: The first test is performed at the time of diagnosis.  A serum creatinine and estimated glomerular filtration rate (eGFR) test is done once a year to assess the level of chronic kidney disease (CKD), if present. Lipid profile (cholesterol, HDL, LDL, triglycerides)  Performed every 5 years for most people.  The goal for LDL is less than 100 mg/dL. If you are at high risk, the goal is less than 70 mg/dL.  The goal for HDL is 40 mg/dL-50 mg/dL for men and 50 mg/dL-60 mg/dL for women. An HDL cholesterol of 60 mg/dL or higher gives some protection against heart disease.  The goal for triglycerides is less than 150 mg/dL. Influenza vaccine, pneumococcal vaccine, and hepatitis B vaccine  The influenza vaccine is recommended yearly.  It is recommended that people with diabetes who are over 24 years old get the pneumonia vaccine. In some cases, two separate shots may be given. Ask your health care provider if your pneumonia vaccination is up to date.  The hepatitis B vaccine is also recommended for adults with diabetes. Diabetes self-management education  Education is recommended at diagnosis and ongoing as needed. Treatment plan  Your treatment plan is reviewed at every medical visit. Document Released: 03/15/2009 Document Revised: 10/02/2013 Document Reviewed: 10/18/2012 [facility] Patient Information 2015 [place],  [employer]. This information is not intended to replace advice given to you by your health care provider. Make sure you discuss any questions you have with your health care provider.    Hypertension Hypertension, commonly called high blood pressure, is when the force of blood pumping through your arteries is too strong. Your arteries are the blood vessels that carry blood from your heart throughout your body. A blood pressure reading consists  of a higher number over a lower number, such as 110/72. The higher number (systolic) is the pressure inside your arteries when your heart pumps. The lower number (diastolic) is the pressure inside your arteries when your heart relaxes. Ideally you want your blood pressure below 120/80. Hypertension forces your heart to work harder to pump blood. Your arteries may become narrow or stiff. Having hypertension puts you at risk for heart disease, stroke, and other problems.  RISK FACTORS Some risk factors for high blood pressure are controllable. Others are not.  Risk factors you cannot control include:   Race. You may be at higher risk if you are African American.  Age. Risk increases with age.  Gender. Men are at higher risk than women before age 84 years. After age 70, women are at higher risk than men. Risk factors you can control include:  Not getting enough exercise or physical activity.  Being overweight.  Getting too much fat, sugar, calories, or salt in your diet.  Drinking too much alcohol. SIGNS AND SYMPTOMS Hypertension does not usually cause signs or symptoms. Extremely high blood pressure (hypertensive crisis) may cause headache, anxiety, shortness of breath, and nosebleed. DIAGNOSIS  To check if you have hypertension, your health care provider will measure your blood pressure while you are seated, with your arm held at the level of your heart. It should be measured at least twice using the same arm. Certain conditions can cause a difference in blood pressure between your right and left arms. A blood pressure reading that is higher than normal on one occasion does not mean that you need treatment. If one blood pressure reading is high, ask your health care provider about having it checked again. TREATMENT  Treating high blood pressure includes making lifestyle changes and possibly taking medicine. Living a healthy lifestyle can help lower high blood pressure. You may need to change  some of your habits. Lifestyle changes may include:  Following the DASH diet. This diet is high in fruits, vegetables, and whole grains. It is low in salt, red meat, and added sugars.  Getting at least 2 hours of brisk physical activity every week.  Losing weight if necessary.  Not smoking.  Limiting alcoholic beverages.  Learning ways to reduce stress. If lifestyle changes are not enough to get your blood pressure under control, your health care provider may prescribe medicine. You may need to take more than one. Work closely with your health care provider to understand the risks and benefits. HOME CARE INSTRUCTIONS  Have your blood pressure rechecked as directed by your health care provider.   Take medicines only as directed by your health care provider. Follow the directions carefully. Blood pressure medicines must be taken as prescribed. The medicine does not work as well when you skip doses. Skipping doses also puts you at risk for problems.   Do not smoke.   Monitor your blood pressure at home as directed by your health care provider. SEEK MEDICAL CARE IF:   You think you are having a reaction to medicines taken.  You have recurrent headaches or feel dizzy.  You have swelling in your ankles.  You have trouble with your vision. SEEK IMMEDIATE MEDICAL CARE IF:  You develop a severe headache or confusion.  You have unusual weakness, numbness, or feel faint.  You have severe chest or abdominal pain.  You vomit repeatedly.  You have trouble breathing. MAKE SURE YOU:   Understand these instructions.  Will watch your condition.  Will get help right away if you are not doing well or get worse. Document Released: 05/18/2005 Document Revised: 10/02/2013 Document Reviewed: 03/10/2013 [facility] Patient Information 2015 [place], [place]. This information is not intended to replace advice given to you by your health care provider. Make sure you discuss any questions  you have with your health care provider.   Hydrochlorothiazide, HCTZ capsules or tablets What is this medicine? HYDROCHLOROTHIAZIDE (hye droe klor oh THYE a zide) is a diuretic. It increases the amount of urine passed, which causes the body to lose salt and water. This medicine is used to treat high blood pressure. It is also reduces the swelling and water retention caused by various medical conditions, such as heart, liver, or kidney disease. This medicine may be used for other purposes; ask your health care provider or pharmacist if you have questions. COMMON BRAND NAME(S): Esidrix, Ezide, HydroDIURIL, Microzide, Oretic, Zide What should I tell my health care provider before I take this medicine? They need to know if you have any of these conditions: -diabetes -gout -immune system problems, like lupus -kidney disease or kidney stones -liver disease -pancreatitis -small amount of urine or difficulty passing urine -an unusual or allergic reaction to hydrochlorothiazide, sulfa drugs, other medicines, foods, dyes, or preservatives -pregnant or trying to get pregnant -breast-feeding How should I use this medicine? Take this medicine by mouth with a glass of water. Follow the directions on the prescription label. Take your medicine at regular intervals. Remember that you will need to pass urine frequently after taking this medicine. Do not take your doses at a time of day that will cause you problems. Do not stop taking your medicine unless your doctor tells you to. Talk to your pediatrician regarding the use of this medicine in children. Special care may be needed. Overdosage: If you think you have taken too much of this medicine contact a poison control center or emergency room at once. NOTE: This medicine is only for you. Do not share this medicine with others. What if I miss a dose? If you miss a dose, take it as soon as you can. If it is almost time for your next dose, take only that dose.  Do not take double or extra doses. What may interact with this medicine? -cholestyramine -colestipol -digoxin -dofetilide -lithium -medicines for blood pressure -medicines for diabetes -medicines that relax muscles for surgery -other diuretics -steroid medicines like prednisone or cortisone This list may not describe all possible interactions. Give your health care provider a list of all the medicines, herbs, non-prescription drugs, or dietary supplements you use. Also tell them if you smoke, drink alcohol, or use illegal drugs. Some items may interact with your medicine. What should I watch for while using this medicine? Visit your doctor or health care professional for regular checks on your progress. Check your blood pressure as directed. Ask your doctor or health care professional what your blood pressure should be and when you should contact him or her. You may need to be on a special diet while taking this medicine. Ask  your doctor. Check with your doctor or health care professional if you get an attack of severe diarrhea, nausea and vomiting, or if you sweat a lot. The loss of too much body fluid can make it dangerous for you to take this medicine. You may get drowsy or dizzy. Do not drive, use machinery, or do anything that needs mental alertness until you know how this medicine affects you. Do not stand or sit up quickly, especially if you are an older patient. This reduces the risk of dizzy or fainting spells. Alcohol may interfere with the effect of this medicine. Avoid alcoholic drinks. This medicine may affect your blood sugar level. If you have diabetes, check with your doctor or health care professional before changing the dose of your diabetic medicine. This medicine can make you more sensitive to the sun. Keep out of the sun. If you cannot avoid being in the sun, wear protective clothing and use sunscreen. Do not use sun lamps or tanning beds/booths. What side effects may I  notice from receiving this medicine? Side effects that you should report to your doctor or health care professional as soon as possible: -allergic reactions such as skin rash or itching, hives, swelling of the lips, mouth, tongue, or throat -changes in vision -chest pain -eye pain -fast or irregular heartbeat -feeling faint or lightheaded, falls -gout attack -muscle pain or cramps -pain or difficulty when passing urine -pain, tingling, numbness in the hands or feet -redness, blistering, peeling or loosening of the skin, including inside the mouth -unusually weak or tired Side effects that usually do not require medical attention (report to your doctor or health care professional if they continue or are bothersome): -change in sex drive or performance -dry mouth -headache -stomach upset This list may not describe all possible side effects. Call your doctor for medical advice about side effects. You may report side effects to FDA at 1-800-FDA-1088. Where should I keep my medicine? Keep out of the reach of children. Store at room temperature between 15 and 30 degrees C (59 and 86 degrees F). Do not freeze. Protect from light and moisture. Keep container closed tightly. Throw away any unused medicine after the expiration date. NOTE: This sheet is a summary. It may not cover all possible information. If you have questions about this medicine, talk to your doctor, pharmacist, or health care provider.  2015, Elsevier/[employer] Standard. (2010-01-10 12:57:37)

## 2015-04-01 ENCOUNTER — Other Ambulatory Visit: Payer: Self-pay | Admitting: Physician Assistant

## 2015-04-04 ENCOUNTER — Encounter: Payer: Self-pay | Admitting: Urgent Care

## 2015-04-04 ENCOUNTER — Ambulatory Visit (INDEPENDENT_AMBULATORY_CARE_PROVIDER_SITE_OTHER): Payer: Medicare Other | Admitting: Urgent Care

## 2015-04-04 VITALS — BP 176/80 | HR 71 | Temp 98.0°F | Resp 16 | Ht 65.0 in | Wt 194.0 lb

## 2015-04-04 DIAGNOSIS — I10 Essential (primary) hypertension: Secondary | ICD-10-CM

## 2015-04-04 DIAGNOSIS — E119 Type 2 diabetes mellitus without complications: Secondary | ICD-10-CM | POA: Diagnosis not present

## 2015-04-04 LAB — COMPLETE METABOLIC PANEL WITH GFR
ALBUMIN: 4.1 g/dL (ref 3.6–5.1)
ALK PHOS: 86 U/L (ref 33–130)
ALT: 10 U/L (ref 6–29)
AST: 13 U/L (ref 10–35)
BUN: 16 mg/dL (ref 7–25)
CALCIUM: 9.8 mg/dL (ref 8.6–10.4)
CHLORIDE: 100 mmol/L (ref 98–110)
CO2: 27 mmol/L (ref 20–31)
CREATININE: 0.74 mg/dL (ref 0.60–0.93)
GFR, Est African American: >89 mL/min (ref >=60)
GFR, Est Non African American: 80 mL/min (ref >=60)
Glucose, Bld: 128 mg/dL — ABNORMAL HIGH (ref 65–99)
Potassium: 4.1 mmol/L (ref 3.5–5.3)
Sodium: 138 mmol/L (ref 135–146)
TOTAL PROTEIN: 6.5 g/dL (ref 6.1–8.1)
Total Bilirubin: 0.6 mg/dL (ref 0.2–1.2)

## 2015-04-04 MED ORDER — LOSARTAN POTASSIUM 100 MG PO TABS: 100.0000 mg | ORAL_TABLET | Freq: Every day | ORAL | Status: DC

## 2015-04-04 MED ORDER — METFORMIN HCL 500 MG PO TABS: 500.0000 mg | ORAL_TABLET | Freq: Two times a day (BID) | ORAL | Status: DC

## 2015-04-04 NOTE — Progress Notes (Signed)
    MRN: [id number] DOB: [birth year]  Subjective:   Angela Chambers is a 74 y.o. female presenting for follow up on HTN.  HTN - patient was started on HCT 12.$RemoveB'5mg'FxahTGwx$  at her last visit for uncontrolled HTN. She presents today reporting occasional palpitations. She needs a refill for her metoprolol which she plans on getting from her cardiologist. Denies lightheadedness, dizziness, chronic headache, double vision, chest pain, shortness of breath, heart racing, nausea, vomiting, abdominal pain, hematuria, lower leg swelling. Patient did not purchase BP cuff, measured her BP at pharmacy once and was ~559 systolic. She was on lisinopril but stopped this due to cough,    DM - last visit was 02/28/2015, well controlled with an A1c less than 7. Needs refill on Metformin. Her current dose in $Remov'500mg'CdVccX$  BID. ROS as above. Denies polydipsia, polyuria.  Denies any other aggravating or relieving factors, no other questions or concerns.  [name] has a current medication list which includes the following prescription(s): aspirin, atorvastatin, hydrochlorothiazide, losartan, metformin, metoprolol succinate, multivitamin, OVER THE COUNTER MEDICATION, PRESCRIPTION MEDICATION, timolol, and travoprost (bak free). Also is allergic to lisinopril.  Angela Chambers  has a past medical history of Hyperlipidemia; Diabetes mellitus; Glaucoma; Fibroids; Arthritis; Cataract; Osteoporosis; Palpitations; Hypertension (07 /2013 ); and Obesity (BMI 30.0-34.9). 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  Objective:   Vitals: BP 176/80 mmHg  Pulse 71  Temp(Src) 98 F (36.7 C)  Resp 16  Ht $R'5\' 5"'UA$  (1.651 m)  Wt 194 lb  (87.998 kg)  BMI 32.28 kg/m2  BP Readings from Last 3 Encounters:  04/04/15 176/80  02/28/15 162/100  11/22/14 144/82   Physical Exam  Constitutional: She is oriented to person, place, and time. She appears well-developed and well-nourished.  HENT:  Mouth/Throat: Oropharynx is clear and moist.  Eyes: Right eye exhibits no discharge. Left eye exhibits no discharge. No scleral icterus.  Neck: Normal range of motion. Neck supple. No thyromegaly present.  Cardiovascular: Normal rate, regular rhythm and intact distal pulses.  Exam reveals no gallop and no friction rub.   No murmur heard. Pulmonary/Chest: No respiratory distress. She has no wheezes. She has no rales.  Abdominal: Soft. Bowel sounds are normal. She exhibits no distension and no mass. There is no tenderness.  Musculoskeletal: She exhibits no edema.  Neurological: She is alert and oriented to person, place, and time.  Skin: Skin is warm and dry. No rash noted. No erythema. No pallor.  Psychiatric: She has a normal mood and affect.   Assessment and Plan :   1. Essential hypertension - Uncontrolled, will increase losartan to $RemoveBef'100mg'HvQjCOTPfg$ . Pending CMet, consider increase in HCT with K+ supplement. Follow up with cardiologist for metoprolol refill. Continue healthy diet. Follow up in 1 month.  2. Diabetes mellitus type 2 in nonobese (HCC) - Refilled Metformin for 3 months with 3 additional refills. Recheck in 6 months - 1 year.  Angela Eagles, PA-C [facility] [phone] 04/04/2015 1:14 PM

## 2015-04-04 NOTE — Patient Instructions (Signed)
Managing Your High Blood Pressure Blood pressure is a measurement of how forceful your blood is pressing against the walls of the arteries. Arteries are muscular tubes within the circulatory system. Blood pressure does not stay the same. Blood pressure rises when you are active, excited, or nervous; and it lowers during sleep and relaxation. If the numbers measuring your blood pressure stay above normal most of the time, you are at risk for health problems. High blood pressure (hypertension) is a long-term (chronic) condition in which blood pressure is elevated. A blood pressure reading is recorded as two numbers, such as 120 over 80 (or 120/80). The first, higher number is called the systolic pressure. It is a measure of the pressure in your arteries as the heart beats. The second, lower number is called the diastolic pressure. It is a measure of the pressure in your arteries as the heart relaxes between beats.  Keeping your blood pressure in a normal range is important to your overall health and prevention of health problems, such as heart disease and stroke. When your blood pressure is uncontrolled, your heart has to work harder than normal. High blood pressure is a very common condition in adults because blood pressure tends to rise with age. Men and women are equally likely to have hypertension but at different times in life. Before age 45, men are more likely to have hypertension. After 74 years of age, women are more likely to have it. Hypertension is especially common in African Americans. This condition often has no signs or symptoms. The cause of the condition is usually not known. Your caregiver can help you come up with a plan to keep your blood pressure in a normal, healthy range. BLOOD PRESSURE STAGES Blood pressure is classified into four stages: normal, prehypertension, stage 1, and stage 2. Your blood pressure reading will be used to determine what type of treatment, if any, is necessary.  Appropriate treatment options are tied to these four stages:  Normal  Systolic pressure (mm Hg): below 120.  Diastolic pressure (mm Hg): below 80. Prehypertension  Systolic pressure (mm Hg): 120 to 139.  Diastolic pressure (mm Hg): 80 to 89. Stage1  Systolic pressure (mm Hg): 140 to 159.  Diastolic pressure (mm Hg): 90 to 99. Stage2  Systolic pressure (mm Hg): 160 or above.  Diastolic pressure (mm Hg): 100 or above. RISKS RELATED TO HIGH BLOOD PRESSURE Managing your blood pressure is an important responsibility. Uncontrolled high blood pressure can lead to:  A heart attack.  A stroke.  A weakened blood vessel (aneurysm).  Heart failure.  Kidney damage.  Eye damage.  Metabolic syndrome.  Memory and concentration problems. HOW TO MANAGE YOUR BLOOD PRESSURE Blood pressure can be managed effectively with lifestyle changes and medicines (if needed). Your caregiver will help you come up with a plan to bring your blood pressure within a normal range. Your plan should include the following: Education  Read all information provided by your caregivers about how to control blood pressure.  Educate yourself on the latest guidelines and treatment recommendations. New research is always being done to further define the risks and treatments for high blood pressure. Lifestylechanges  Control your weight.  Avoid smoking.  Stay physically active.  Reduce the amount of salt in your diet.  Reduce stress.  Control any chronic conditions, such as high cholesterol or diabetes.  Reduce your alcohol intake. Medicines  Several medicines (antihypertensive medicines) are available, if needed, to bring blood pressure within a normal range.  Communication °· Review all the medicines you take with your caregiver because there may be side effects or interactions. °· Talk with your caregiver about your diet, exercise habits, and other lifestyle factors that may be contributing to  high blood pressure. °· See your caregiver regularly. Your caregiver can help you create and adjust your plan for managing high blood pressure. °RECOMMENDATIONS FOR TREATMENT AND FOLLOW-UP  °The following recommendations are based on current guidelines for managing high blood pressure in nonpregnant adults. Use these recommendations to identify the proper follow-up period or treatment option based on your blood pressure reading. You can discuss these options with your caregiver. °· Systolic pressure of 120 to 139 or diastolic pressure of 80 to 89: Follow up with your caregiver as directed. °· Systolic pressure of 140 to 160 or diastolic pressure of 90 to 100: Follow up with your caregiver within 2 months. °· Systolic pressure above 160 or diastolic pressure above 100: Follow up with your caregiver within 1 month. °· Systolic pressure above 180 or diastolic pressure above 110: Consider antihypertensive therapy; follow up with your caregiver within 1 week. °· Systolic pressure above 200 or diastolic pressure above 120: Begin antihypertensive therapy; follow up with your caregiver within 1 week. °  °This information is not intended to replace advice given to you by your health care provider. Make sure you discuss any questions you have with your health care provider. °  °Document Released: 02/10/2012 Document Reviewed: 02/10/2012 °Elsevier Interactive Patient Education ©2016 [employer]vier Inc. ° °

## 2015-04-05 ENCOUNTER — Other Ambulatory Visit: Payer: Self-pay | Admitting: Urgent Care

## 2015-04-08 ENCOUNTER — Telehealth: Payer: Self-pay | Admitting: Urgent Care

## 2015-04-08 MED ORDER — METOPROLOL SUCCINATE ER 25 MG PO TB24: 25.0000 mg | ORAL_TABLET | Freq: Every day | ORAL | Status: DC

## 2015-04-08 MED ORDER — AMLODIPINE BESYLATE 5 MG PO TABS: 5.0000 mg | ORAL_TABLET | Freq: Every day | ORAL | Status: DC

## 2015-04-08 NOTE — Telephone Encounter (Signed)
Reported normal Cmet, patient is checking her blood pressure at home, reports it has been in the 160s. At her last visit on 04/04/2015, patient was increase her Losartan 50 mg to 100 mg. She has followup scheduled with her cardiologist in January 2017. I will start patient on amlodipine 5 mg, refill her metoprolol until she can check back with her cardiologist. Followup with me in 4 weeks.

## 2015-05-09 ENCOUNTER — Ambulatory Visit (INDEPENDENT_AMBULATORY_CARE_PROVIDER_SITE_OTHER): Payer: Medicare Other | Admitting: Urgent Care

## 2015-05-09 ENCOUNTER — Encounter: Payer: Self-pay | Admitting: Urgent Care

## 2015-05-09 VITALS — BP 125/71 | HR 80 | Temp 98.2°F | Resp 16 | Ht 65.0 in | Wt 194.0 lb

## 2015-05-09 DIAGNOSIS — I10 Essential (primary) hypertension: Secondary | ICD-10-CM

## 2015-05-09 NOTE — Progress Notes (Signed)
    MRN: [id number] DOB: [birth year]  Subjective:   Angela Chambers is a 74 y.o. female presenting for chief complaint of Follow-up  Reports that she is doing much better with managing her blood pressure. She started exercising again, has been very aware of the foods she is cooking, avoiding salt in her diet. Denies lightheadedness, dizziness, chronic headache, double vision, chest pain, shortness of breath, heart racing, palpitations, nausea, vomiting, abdominal pain, hematuria, lower leg swelling.  Patient does well at home, has good support network with her husband and children. She states that she cooks, cleans and has no difficulty with her activities of daily living. She actively trying to eat healthily for both her diabetes and hypertension management. Denies depressed mood, decreased appetite, decreased energy, confusion, disorientation, weakness. Family history is positive for diabetes and heart failure in her mother, she passed away in her 1's.  Angela Chambers has a current medication list which includes the following prescription(s): amlodipine, aspirin, atorvastatin, hydrochlorothiazide, losartan, metformin, metoprolol succinate, multivitamin, OVER THE COUNTER MEDICATION, PRESCRIPTION MEDICATION, travoprost (bak free), losartan, and timolol. Also is allergic to lisinopril.  Angela Chambers  has a past medical history of Hyperlipidemia; Diabetes mellitus; Glaucoma; Fibroids; Arthritis; Cataract; Osteoporosis; Palpitations; Hypertension (07 /2013 ); and Obesity (BMI 30.0-34.9). 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  Objective:   Vitals: BP 125/71 mmHg  Pulse 80  Temp(Src)  98.2 F (36.8 C) (Oral)  Resp 16  Ht _0  (1.651 m)  Wt 194 lb (87.998 kg)  BMI 32.28 kg/m2  SpO2 98%  Physical Exam  Constitutional: She is oriented to person, place, and time. She appears well-developed and well-nourished.  HENT:  Mouth/Throat: Oropharynx is clear and moist.  Eyes: Pupils are equal, round, and reactive to light.  Neck: Normal range of motion. Neck supple. No thyromegaly present.  Cardiovascular: Normal rate, regular rhythm and intact distal pulses.   Pulmonary/Chest: No respiratory distress. She has no wheezes. She has no rales.  Musculoskeletal: She exhibits no edema.  Neurological: She is alert and oriented to person, place, and time. No cranial nerve deficit. Coordination normal.  Skin: Skin is warm and dry. No rash noted. No erythema. No pallor.  Psychiatric: She has a normal mood and affect.   Assessment and Plan :   1. Essential hypertension - Well-controlled, emphasized continued efforts at managing her diet and exercise. Patient has appointment with her cardiologist for f/u in 06/2015. RTC for f/u in 6 months for diabetes and HTN recheck. - Offered patient refills on her medications prior to her going on a cruise 05/24/2015. She will be going with her husband and adult children.  Angela Eagles, PA-C [facility] [phone] 05/09/2015 1:19 PM

## 2015-05-09 NOTE — Patient Instructions (Signed)
Managing Your High Blood Pressure Blood pressure is a measurement of how forceful your blood is pressing against the walls of the arteries. Arteries are muscular tubes within the circulatory system. Blood pressure does not stay the same. Blood pressure rises when you are active, excited, or nervous; and it lowers during sleep and relaxation. If the numbers measuring your blood pressure stay above normal most of the time, you are at risk for health problems. High blood pressure (hypertension) is a long-term (chronic) condition in which blood pressure is elevated. A blood pressure reading is recorded as two numbers, such as 120 over 80 (or 120/80). The first, higher number is called the systolic pressure. It is a measure of the pressure in your arteries as the heart beats. The second, lower number is called the diastolic pressure. It is a measure of the pressure in your arteries as the heart relaxes between beats.  Keeping your blood pressure in a normal range is important to your overall health and prevention of health problems, such as heart disease and stroke. When your blood pressure is uncontrolled, your heart has to work harder than normal. High blood pressure is a very common condition in adults because blood pressure tends to rise with age. Men and women are equally likely to have hypertension but at different times in life. Before age 45, men are more likely to have hypertension. After 74 years of age, women are more likely to have it. Hypertension is especially common in African Americans. This condition often has no signs or symptoms. The cause of the condition is usually not known. Your caregiver can help you come up with a plan to keep your blood pressure in a normal, healthy range. BLOOD PRESSURE STAGES Blood pressure is classified into four stages: normal, prehypertension, stage 1, and stage 2. Your blood pressure reading will be used to determine what type of treatment, if any, is necessary.  Appropriate treatment options are tied to these four stages:  Normal  Systolic pressure (mm Hg): below 120.  Diastolic pressure (mm Hg): below 80. Prehypertension  Systolic pressure (mm Hg): 120 to 139.  Diastolic pressure (mm Hg): 80 to 89. Stage1  Systolic pressure (mm Hg): 140 to 159.  Diastolic pressure (mm Hg): 90 to 99. Stage2  Systolic pressure (mm Hg): 160 or above.  Diastolic pressure (mm Hg): 100 or above. RISKS RELATED TO HIGH BLOOD PRESSURE Managing your blood pressure is an important responsibility. Uncontrolled high blood pressure can lead to:  A heart attack.  A stroke.  A weakened blood vessel (aneurysm).  Heart failure.  Kidney damage.  Eye damage.  Metabolic syndrome.  Memory and concentration problems. HOW TO MANAGE YOUR BLOOD PRESSURE Blood pressure can be managed effectively with lifestyle changes and medicines (if needed). Your caregiver will help you come up with a plan to bring your blood pressure within a normal range. Your plan should include the following: Education  Read all information provided by your caregivers about how to control blood pressure.  Educate yourself on the latest guidelines and treatment recommendations. New research is always being done to further define the risks and treatments for high blood pressure. Lifestylechanges  Control your weight.  Avoid smoking.  Stay physically active.  Reduce the amount of salt in your diet.  Reduce stress.  Control any chronic conditions, such as high cholesterol or diabetes.  Reduce your alcohol intake. Medicines  Several medicines (antihypertensive medicines) are available, if needed, to bring blood pressure within a normal range.  Communication °· Review all the medicines you take with your caregiver because there may be side effects or interactions. °· Talk with your caregiver about your diet, exercise habits, and other lifestyle factors that may be contributing to  high blood pressure. °· See your caregiver regularly. Your caregiver can help you create and adjust your plan for managing high blood pressure. °RECOMMENDATIONS FOR TREATMENT AND FOLLOW-UP  °The following recommendations are based on current guidelines for managing high blood pressure in nonpregnant adults. Use these recommendations to identify the proper follow-up period or treatment option based on your blood pressure reading. You can discuss these options with your caregiver. °· Systolic pressure of 120 to 139 or diastolic pressure of 80 to 89: Follow up with your caregiver as directed. °· Systolic pressure of 140 to 160 or diastolic pressure of 90 to 100: Follow up with your caregiver within 2 months. °· Systolic pressure above 160 or diastolic pressure above 100: Follow up with your caregiver within 1 month. °· Systolic pressure above 180 or diastolic pressure above 110: Consider antihypertensive therapy; follow up with your caregiver within 1 week. °· Systolic pressure above 200 or diastolic pressure above 120: Begin antihypertensive therapy; follow up with your caregiver within 1 week. °  °This information is not intended to replace advice given to you by your health care provider. Make sure you discuss any questions you have with your health care provider. °  °Document Released: 02/10/2012 Document Reviewed: 02/10/2012 °Elsevier Interactive Patient Education ©2016 [employer]vier Inc. ° °

## 2015-05-28 ENCOUNTER — Other Ambulatory Visit: Payer: Self-pay

## 2015-05-28 MED ORDER — ATORVASTATIN CALCIUM 20 MG PO TABS: ORAL_TABLET | ORAL | Status: DC

## 2015-06-02 HISTORY — PX: NM MYOVIEW LTD: HXRAD82

## 2015-06-07 ENCOUNTER — Encounter: Payer: Self-pay | Admitting: Cardiology

## 2015-06-07 ENCOUNTER — Ambulatory Visit (INDEPENDENT_AMBULATORY_CARE_PROVIDER_SITE_OTHER): Payer: Medicare Other | Admitting: Cardiology

## 2015-06-07 VITALS — BP 132/86 | HR 74 | Ht 65.0 in | Wt 193.9 lb

## 2015-06-07 DIAGNOSIS — I10 Essential (primary) hypertension: Secondary | ICD-10-CM

## 2015-06-07 DIAGNOSIS — E785 Hyperlipidemia, unspecified: Secondary | ICD-10-CM | POA: Diagnosis not present

## 2015-06-07 DIAGNOSIS — R079 Chest pain, unspecified: Secondary | ICD-10-CM | POA: Diagnosis not present

## 2015-06-07 DIAGNOSIS — E669 Obesity, unspecified: Secondary | ICD-10-CM

## 2015-06-07 DIAGNOSIS — I209 Angina pectoris, unspecified: Secondary | ICD-10-CM

## 2015-06-07 MED ORDER — METOPROLOL SUCCINATE ER 50 MG PO TB24: 50.0000 mg | ORAL_TABLET | Freq: Every day | ORAL | Status: DC

## 2015-06-07 NOTE — Progress Notes (Signed)
PCP: Mani,Mario, PA-C  Clinic Note: Chief Complaint  Patient presents with  . Annual Exam    Recent vacation did a lot of walking - SOB with brisk walking  and occas. chest tightness which resolved with slowing down or stopping.  Mild ankle edema.  No dizziness.  . Hypertension  . Hyperlipidemia    HPI: Angela Chambers is a 75 y.o. female with a PMH below who presents today for annual follow-up of cardiac risk factors. She exited presents today with exertional chest tightness and pressure.Angela Chambers was last seen January 2016. Doing relatively well.  Recent Hospitalizations: None  Studies Reviewed: None  Interval History: She has usually been doing relatively well. She has been evaluated in the past with stress test for preoperative assessment for knee surgeries. She has limited somewhat by an arthritis pain in her knees and hips. She notes that over the last couple months she has been starting to have some tightness in the center of the chest when she pushes herself. She usually tries to exercise on a stationary bike or treadmill, but has had limited herself when she starts noting the symptom. If she backs off to a slower heart rate, she the symptom will subside after a minute or 2. She was recently on a cruise and had done a lot of walking along the [place] sites and was trying to keep up with her husband. In doing so she would deathly note having tightness in her chest and dyspnea when he was walking too fast. It was always alleviated with rest, but this is in relatively new thing for her. She does not note the symptom of any routine activity or at rest. No PND, orthopnea or edema. No rapid irregular heartbeat/palpitations.   No lightheadedness, dizziness, weakness or syncope/near syncope. No TIA/amaurosis fugax symptoms. No melena, hematochezia, hematuria, or epstaxis. No claudication.  ROS: A comprehensive was performed. Review of Systems  Constitutional:  Negative for malaise/fatigue.  Respiratory: Negative for cough and shortness of breath.   Cardiovascular: Negative for claudication.  Gastrointestinal: Negative for heartburn and abdominal pain.  Musculoskeletal: Positive for joint pain.  Neurological: Negative for dizziness, loss of consciousness, weakness and headaches.  All other systems reviewed and are negative.   Past Medical History  Diagnosis Date  . Hyperlipidemia   . Diabetes mellitus   . Glaucoma   . Fibroids   . Arthritis   . Cataract   . Osteoporosis   . Palpitations     CONTROLLED ON BETA BLOCKER  . Hypertension 07 /2013     WITH EF GREATER THAN 55 % WITH MILD MR BUT NO PROLASPE ,ESSENTIALLY NORMAL DONE TO EVALUATE PALPITATIONS .  Marland Kitchen Obesity (BMI 30.0-34.9)     Past Surgical History  Procedure Laterality Date  . Knee surgery  1984    per pt screws in R knee  . 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 Abdominal hysterectomy  10/18/ 1982    TAH-BSO for fibroids and adenomatous hyperplasia  . Tubal ligation    . 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 Persantine myoview  02/ 2011     SHOWED AN APICAL DEFECT BUT NO REVERSIBILITY THOUGHT TO BE DUE TO BREAST ATTENUATION .  Marland Kitchen Transthoracic echocardiogram  July 2013    EF greater than 55%, mild MR no prolapse.  Normal  . Cpet-met  test (cardiopulmonary exercise test)  03/06/2013    Adequate effort at 1.11. Peak VO2 12.3 is 86% in the normal range. Heart rate was just outside the normal range target being 127 beats a minute but this was 86% of projected. She did have an ischemic pattern after the anterograde threshold with increased heart rate and blunting of stroke volume during the last 3 minutes of exercise, but was asymptomatic.  . Total knee arthroplasty Right 09/25/2013    Procedure: RIGHT TOTAL KNEE ARTHROPLASTY;  Surgeon: Gearlean Alf, MD;  Location: [facility];  Service: Orthopedics;   Laterality: Right;   Prior to Admission medications   Medication Sig Start Date End Date Taking? Authorizing Provider  amLODipine (NORVASC) 5 MG tablet Take 1 tablet (5 mg total) by mouth daily. 04/08/15  Yes [name], PA-C  aspirin 81 MG tablet Take 81 mg by mouth daily.   Yes Historical Provider, MD  atorvastatin (LIPITOR) 20 MG tablet TAKE 1 TABLET BY MOUTH ONCE DAILY 05/28/15  Yes [name], PA-C  Co-Enzyme Q-10 30 MG CAPS Take 30 mg by mouth daily.   Yes Historical Provider, MD  hydrochlorothiazide (HYDRODIURIL) 12.5 MG tablet Take 1 tablet (12.5 mg total) by mouth daily. 02/28/15  Yes [name], PA-C  losartan (COZAAR) 100 MG tablet Take 1 tablet (100 mg total) by mouth daily. 04/04/15  Yes [name], PA-C  losartan (COZAAR) 50 MG tablet take 1 tablet by mouth once daily 04/05/15  Yes [name], PA-C  metFORMIN (GLUCOPHAGE) 500 MG tablet Take 1 tablet (500 mg total) by mouth 2 (two) times daily with a meal. 04/04/15  Yes [name], PA-C  metoprolol succinate (TOPROL-XL) 25 MG 24 hr tablet Take 1 tablet (25 mg total) by mouth daily. 04/08/15  Yes [name], PA-C  Multiple Vitamin (MULTIVITAMIN) tablet Take 1 tablet by mouth daily.   Yes Historical Provider, MD  OVER THE COUNTER MEDICATION Co Q 10 taking once daily   Yes Historical Provider, MD  PRESCRIPTION MEDICATION Timolol 0.05 % taking 1 drop to each eye every am   Yes Historical Provider, MD  timolol (TIMOPTIC) 0.5 % ophthalmic solution Place into both eyes every morning. 04/30/14  Yes Historical Provider, MD  Travoprost, BAK Free, (TRAVATAN) 0.004 % SOLN ophthalmic solution Place 1 drop into both eyes at bedtime.   Yes Historical Provider, MD   Allergies  Allergen Reactions  . Lisinopril Cough     Social History   Social History  . Marital Status: Married    Spouse Name: N/A  . Number of Children: N/A  . Years of Education: N/A   Occupational History  . retired    Social History Main Topics  . Smoking status: Former  Smoker -- 0.50 packs/day for 20 years    Types: Cigarettes    Quit date: 07/13/1981  . Smokeless tobacco: Never Used  . Alcohol Use: No  . Drug Use: No  . Sexual Activity: Yes   Other Topics Concern  . None   Social History Narrative   SHE IS MARRIED ,MOTHER OF 4, GRANDMOTHER OF 6. SHE DOES EXERCISE BUT SOMEWHAT LIMITED BECAUSE OF HER KNEE (S/P SURGERY 2015).   SHE QUIT SMOKING IN 1980 AND SHE DOES NOT DRINK   Family History  Problem Relation Age of Onset  . Diabetes Mother   . Heart disease Mother   . Hyperlipidemia Mother   . Diabetes Sister   . Hyperlipidemia Sister   . Hypertension Sister   . Diabetes Brother   .  Hyperlipidemia Brother   . Hyperlipidemia Sister     Wt Readings from Last 3 Encounters:  06/07/15 193 lb 14.4 oz (87.952 kg)  05/09/15 194 lb (87.998 kg)  04/04/15 194 lb (87.998 kg)    PHYSICAL EXAM BP 132/86 mmHg  Pulse 74  Ht 5' 5"  (1.651 m)  Wt 193 lb 14.4 oz (87.952 kg)  BMI 32.27 kg/m2 General: Very pleasant healthy-appearing AAF, NAD, A&Ox4; answers questions appropriately. Normal mood and affect. HEENT: NCAT, EOMI, MMM, anicteric sclerae.  Neck: Supple; no LAN,JVD, or carotid bruit.  Heart: RRR, normal S1, S2. No M/R/G. Nondisplaced PMI.  Lungs: CTAB, nonlabored,normal effort, good air movement.  Abdomen: Soft/NT/ND/NABS. No HSM. Mild obesity.  Extremities: No C/C/E, 2+ equal pulses throughout.  Neuro: CN II-XII Grossly intact.  Non-focal    Adult ECG Report  Rate: 74 ;  Rhythm: normal sinus rhythm and Normal axis, intervals and durations;   Narrative Interpretation: Normal EKG   Other studies Reviewed: Additional studies/ records that were reviewed today include:  Recent Labs:   Lab Results  Component Value Date   CHOL 177 11/22/2014   HDL 76 11/22/2014   LDLCALC 79 11/22/2014   TRIG 109 11/22/2014   CHOLHDL 2.3 11/22/2014   Lab Results  Component Value Date   HGBA1C 6.7* 02/28/2015    ASSESSMENT / PLAN: Problem List  Items Addressed This Visit    Obesity (BMI 30.0-34.9) (Chronic)    She is aware of her need to lose weight. Unfortunately right now she is scared to do any significant exercise. We need to exclude the existence of ischemic coronary disease and oriented to allow her to be comfortable going back to exercising.      Hyperlipidemia (Chronic)    She is on moderate dose atorvastatin. LDL is pretty well-controlled. Will need to see what the results of stress test are. We may need to be more aggressive.      Relevant Medications   metoprolol succinate (TOPROL-XL) 50 MG 24 hr tablet   Essential hypertension (Chronic)    Relatively well-controlled with current dose of ACE inhibitor. She should be a tolerate the increased dose of beta blocker      Relevant Medications   metoprolol succinate (TOPROL-XL) 50 MG 24 hr tablet   Chest pain on exertion    She has had this in the past, but not described she is describing her current symptoms. Fascia nonobstructive disease and a negative Myoview with normal CPET test.  We have a good baseline with which to compare for her upcoming stress test.      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 This would be concerning for potential progression of disease.  Based on the fact there is now associated with moderate exertion, I would say is at least angina class II.  Plan: Will attempt a treadmill Myoview, however in the past she has had to do Lexiscan -- we discussed the possibility of needing to proceed to cardiac catheterization depending on the results. Will increase beta blocker dose to 50 mg twice a day.      Relevant Medications   metoprolol succinate (TOPROL-XL) 50 MG 24 hr tablet   Other Relevant Orders   EKG 12-Lead   Myocardial Perfusion Imaging      Current medicines are reviewed at length with the patient today. (+/- concerns)  n/a The following  changes have been made:    Studies Ordered:   Orders Placed This Encounter  Procedures  . Myocardial Perfusion Imaging  . EKG 12-Lead      Leonie Man, M.D., M.S. Interventional Cardiologist   Pager # [phone]

## 2015-06-07 NOTE — Patient Instructions (Signed)
Your physician has recommended you make the following change in your medication: INCREASE METOPROLOL ER TO 50 MG AT BEDTIME  (HOLD THE NIGHT BEFORE THE NUCLEAR STRESS TEST).  Your physician has requested that you have an exercise stress myoview. For further information please visit [email or web address]. Please follow instruction sheet, as given.  Your physician recommends that you schedule a follow-up appointment in: AFTER STRESS TEST WITH DR. HARDING.

## 2015-06-09 ENCOUNTER — Encounter: Payer: Self-pay | Admitting: Cardiology

## 2015-06-09 NOTE — Assessment & Plan Note (Signed)
I'm concerned that her symptom of chest tightness or pressure with exertion is classic for angina is relatively new for her. This would be concerning for potential progression of disease.  Based on the fact there is now associated with moderate exertion, I would say is at least angina class II.  Plan: Will attempt a treadmill Myoview, however in the past she has had to do Lexiscan -- we discussed the possibility of needing to proceed to cardiac catheterization depending on the results. Will increase beta blocker dose to 50 mg twice a day.

## 2015-06-09 NOTE — Assessment & Plan Note (Signed)
Relatively well-controlled with current dose of ACE inhibitor. She should be a tolerate the increased dose of beta blocker

## 2015-06-09 NOTE — Assessment & Plan Note (Signed)
She is on moderate dose atorvastatin. LDL is pretty well-controlled. Will need to see what the results of stress test are. We may need to be more aggressive.

## 2015-06-09 NOTE — Assessment & Plan Note (Signed)
She has had this in the past, but not described she is describing her current symptoms. Fascia nonobstructive disease and a negative Myoview with normal CPET test.  We have a good baseline with which to compare for her upcoming stress test.

## 2015-06-09 NOTE — Assessment & Plan Note (Signed)
She is aware of her need to lose weight. Unfortunately right now she is scared to do any significant exercise. We need to exclude the existence of ischemic coronary disease and oriented to allow her to be comfortable going back to exercising.

## 2015-06-13 ENCOUNTER — Telehealth (HOSPITAL_COMMUNITY): Payer: Self-pay

## 2015-06-13 NOTE — Telephone Encounter (Signed)
Encounter complete. 

## 2015-06-18 ENCOUNTER — Encounter (HOSPITAL_COMMUNITY): Payer: Self-pay | Admitting: *Deleted

## 2015-06-18 ENCOUNTER — Ambulatory Visit (HOSPITAL_COMMUNITY)
Admission: RE | Admit: 2015-06-18 | Discharge: 2015-06-18 | Disposition: A | Discharge status: Home / Self Care | Payer: Medicare Other | Source: Ambulatory Visit | Attending: Cardiovascular Disease | Admitting: Cardiovascular Disease

## 2015-06-18 DIAGNOSIS — R002 Palpitations: Secondary | ICD-10-CM | POA: Insufficient documentation

## 2015-06-18 DIAGNOSIS — R0609 Other forms of dyspnea: Secondary | ICD-10-CM | POA: Diagnosis not present

## 2015-06-18 DIAGNOSIS — I209 Angina pectoris, unspecified: Secondary | ICD-10-CM | POA: Diagnosis not present

## 2015-06-18 DIAGNOSIS — Z6832 Body mass index (BMI) 32.0-32.9, adult: Secondary | ICD-10-CM | POA: Insufficient documentation

## 2015-06-18 DIAGNOSIS — Z8249 Family history of ischemic heart disease and other diseases of the circulatory system: Secondary | ICD-10-CM | POA: Diagnosis not present

## 2015-06-18 DIAGNOSIS — E663 Overweight: Secondary | ICD-10-CM | POA: Insufficient documentation

## 2015-06-18 DIAGNOSIS — R079 Chest pain, unspecified: Secondary | ICD-10-CM | POA: Diagnosis not present

## 2015-06-18 DIAGNOSIS — E119 Type 2 diabetes mellitus without complications: Secondary | ICD-10-CM | POA: Insufficient documentation

## 2015-06-18 DIAGNOSIS — Z87891 Personal history of nicotine dependence: Secondary | ICD-10-CM | POA: Insufficient documentation

## 2015-06-18 DIAGNOSIS — I10 Essential (primary) hypertension: Secondary | ICD-10-CM | POA: Diagnosis not present

## 2015-06-18 LAB — MYOCARDIAL PERFUSION IMAGING
CHL CUP NUCLEAR SDS: 0
CHL CUP NUCLEAR SRS: 0
CHL CUP NUCLEAR SSS: 0
CSEPED: 6 min
Estimated workload: 7 METS
LV dias vol: 66 mL
LVSYSVOL: 17 mL
MPHR: 146 {beats}/min
Peak HR: 136 {beats}/min
Percent HR: 93 %
RPE: 15
Rest HR: 71 {beats}/min
TID: 0.95

## 2015-06-18 MED ORDER — TECHNETIUM TC 99M SESTAMIBI GENERIC - CARDIOLITE
30.8000 | Freq: Once | INTRAVENOUS | Status: AC | PRN
  Administered 2015-06-18: 30.8 via INTRAVENOUS

## 2015-06-18 MED ORDER — TECHNETIUM TC 99M SESTAMIBI GENERIC - CARDIOLITE
9.9000 | Freq: Once | INTRAVENOUS | Status: AC | PRN
  Administered 2015-06-18: 10 via INTRAVENOUS

## 2015-06-18 NOTE — Progress Notes (Signed)
Dr Ellyn Hack reviewed Cardiolite study. Per Dr Ellyn Hack, ok discharge pt home. Patient to be seen by Dr Ellyn Hack Jun 21, 2015 for follow-up appt for nuclear stress results. Angela Chambers

## 2015-06-21 ENCOUNTER — Encounter: Payer: Self-pay | Admitting: Cardiology

## 2015-06-21 ENCOUNTER — Ambulatory Visit (INDEPENDENT_AMBULATORY_CARE_PROVIDER_SITE_OTHER): Payer: Medicare Other | Admitting: Cardiology

## 2015-06-21 VITALS — BP 130/80 | HR 72 | Ht 65.0 in | Wt 195.0 lb

## 2015-06-21 DIAGNOSIS — E785 Hyperlipidemia, unspecified: Secondary | ICD-10-CM

## 2015-06-21 DIAGNOSIS — Z01818 Encounter for other preprocedural examination: Secondary | ICD-10-CM

## 2015-06-21 DIAGNOSIS — I209 Angina pectoris, unspecified: Secondary | ICD-10-CM

## 2015-06-21 DIAGNOSIS — R9439 Abnormal result of other cardiovascular function study: Secondary | ICD-10-CM | POA: Insufficient documentation

## 2015-06-21 DIAGNOSIS — D689 Coagulation defect, unspecified: Secondary | ICD-10-CM

## 2015-06-21 DIAGNOSIS — I10 Essential (primary) hypertension: Secondary | ICD-10-CM | POA: Diagnosis not present

## 2015-06-21 NOTE — Patient Instructions (Addendum)
LABS - PTT,PT,BMP,CBC FOR CARDIAC CATH.   Your physician has requested that you have a LEFT HEART cardiac catheterization LEFT RADIAL WEEK OF Jul 01 2015. Cardiac catheterization is used to diagnose and/or treat various heart conditions. Doctors may recommend this procedure for a number of different reasons. The most common reason is to evaluate chest pain. Chest pain can be a symptom of coronary artery disease (CAD), and cardiac catheterization can show whether plaque is narrowing or blocking your heart's arteries. This procedure is also used to evaluate the valves, as well as measure the blood flow and oxygen levels in different parts of your heart. For further information please visit [email or web address]. Please follow instruction sheet, as given.  Your physician recommends that you schedule a follow-up appointment in [place] CATH.

## 2015-06-21 NOTE — Progress Notes (Signed)
PCP: Mani,Mario, PA-C  Clinic Note: Chief Complaint  Patient presents with  . Follow-up    no chest pain, no shortness of breath, no swelling, no cramping, no dizziness or lightheadedness    HPI: Angela Chambers is a 75 y.o. female with a PMH below who presents today for close f/u after Myoview ordered for SSx concerning for new onset exertional Angina (Class II-III). In the past she is had a preoperative stress test for knee surgery that was negative for ischemia.  Angela Chambers was last seen on 06/07/2015 with a complaint of worsening symptoms of exertional chest tightness and dyspnea. This was associated with moderate level of activity (class II) basically she was on a trip with her husband, and trouble walking fast to try keeping up with him she began noticing this chest tightness and dyspnea that resolved with resting. However since then, she has noted that were working out on her trip treadmill or stationary bicycle, if she exerts herself beyond a certain level of heart rate, she had recurrence of symptoms that resolved after reducing her level of exercise. She was referred for a treadmill Myoview (symptom limited as she had been on metoprolol succinate).  Recent Hospitalizations: None  Studies Reviewed:   Exercise Myoview: Exercise for 6 minutes, 7 METS (93% MPHR) --> exercise was stopped due to chest discomfort and dyspnea (non-limiting angina), notable ST depressions noted during stress => high risk to Duke Treadmill score. However no evidence of ischemia or infarction on perfusion images. EF 74%.  Interval History:  She now presents for follow-up after these results. She is still noticing her exertional chest discomfort and dyspnea that seems more limiting now. Perhaps more class III. His cost her stress test. She said she definitely had her concerning symptoms with exercise during that test. She has not had any resting symptoms of chest tightness or pressure. No resting  dyspnea.  No PND, orthopnea or edema. No palpitations, lightheadedness, dizziness, weakness or syncope/near syncope. No TIA/amaurosis fugax symptoms. No claudication.  ROS: A comprehensive was performed. Review of Systems  Constitutional: Negative for malaise/fatigue.  HENT: Negative for nosebleeds.   Respiratory: Positive for shortness of breath (Exertional). Negative for cough and wheezing.   Gastrointestinal: Negative for blood in stool and melena.  Genitourinary: Negative for hematuria.  Musculoskeletal: Negative.   Neurological: Negative for dizziness, weakness and headaches.  Endo/Heme/Allergies: Does not bruise/bleed easily.  Psychiatric/Behavioral: The patient is nervous/anxious (very nervous about new onset of symptoms).   All other systems reviewed and are negative.   Past Medical History  Diagnosis Date  . Hyperlipidemia   . Diabetes mellitus   . Glaucoma   . Fibroids   . Arthritis   . Cataract   . Osteoporosis   . Palpitations     CONTROLLED ON BETA BLOCKER  . Hypertension 07 /2013     WITH EF GREATER THAN 55 % WITH MILD MR BUT NO PROLASPE ,ESSENTIALLY NORMAL DONE TO EVALUATE PALPITATIONS .  Marland Kitchen Obesity (BMI 30.0-34.9)     Past Surgical History  Procedure Laterality Date  . Knee surgery  1984    per pt screws in R knee  . 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 Abdominal hysterectomy  10/18/ 1982    TAH-BSO for fibroids and adenomatous hyperplasia  . Tubal ligation    . 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  Persantine myoview  02/ 2011     SHOWED AN APICAL DEFECT BUT NO REVERSIBILITY THOUGHT TO BE DUE TO BREAST ATTENUATION .  Marland Kitchen Transthoracic echocardiogram  July 2013    EF greater than 55%, mild MR no prolapse.  Normal  . Cpet-met test (cardiopulmonary exercise test)  03/06/2013    Adequate effort at 1.11. Peak VO2 12.3 is 86% in the normal range. Heart rate was just outside the  normal range target being 127 beats a minute but this was 86% of projected. She did have an ischemic pattern after the anterograde threshold with increased heart rate and blunting of stroke volume during the last 3 minutes of exercise, but was asymptomatic.  . 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 Authorizing Provider  amLODipine (NORVASC) 5 MG tablet Take 1 tablet (5 mg total) by mouth daily. 04/08/15   [name], PA-C  aspirin 81 MG tablet Take 81 mg by mouth daily.    Historical Provider, MD  atorvastatin (LIPITOR) 20 MG tablet TAKE 1 TABLET BY MOUTH ONCE DAILY 05/28/15   [name], PA-C  Co-Enzyme Q-10 30 MG CAPS Take 30 mg by mouth daily.    Historical Provider, MD  hydrochlorothiazide (HYDRODIURIL) 12.5 MG tablet Take 1 tablet (12.5 mg total) by mouth daily. 02/28/15   [name], PA-C  losartan (COZAAR) 100 MG tablet Take 1 tablet (100 mg total) by mouth daily. 04/04/15   [name], PA-C  metFORMIN (GLUCOPHAGE) 500 MG tablet Take 1 tablet (500 mg total) by mouth 2 (two) times daily with a meal. 04/04/15   [name], PA-C  metoprolol succinate (TOPROL-XL) 50 MG 24 hr tablet Take 1 tablet (50 mg total) by mouth daily. 06/07/15   Leonie Man, MD  Multiple Vitamin (MULTIVITAMIN) tablet Take 1 tablet by mouth daily.    Historical Provider, MD  OVER THE COUNTER MEDICATION Co Q 10 taking once daily    Historical Provider, MD  PRESCRIPTION MEDICATION Timolol 0.05 % taking 1 drop to each eye every am    Historical Provider, MD  timolol (TIMOPTIC) 0.5 % ophthalmic solution Place into both eyes every morning. 04/30/14   Historical Provider, MD  Travoprost, BAK Free, (TRAVATAN) 0.004 % SOLN ophthalmic solution Place 1 drop into both eyes at bedtime.    Historical Provider, MD    Allergies  Allergen Reactions  . Lisinopril Cough    Social History   Social History  . Marital Status: Married    Spouse Name: N/A  . Number of Children: N/A  . Years of Education: N/A   Occupational History  . retired    Social History Main Topics  . Smoking status: Former Smoker -- 0.50 packs/day for 20 years    Types: Cigarettes    Quit date: 07/13/1981  . Smokeless tobacco: Never Used  . Alcohol Use: No  . Drug Use: No  . Sexual Activity: Yes   Other Topics Concern  . None   Social History Narrative   SHE IS MARRIED ,MOTHER OF 4, GRANDMOTHER OF 6. SHE DOES EXERCISE BUT SOMEWHAT LIMITED BECAUSE OF HER KNEE (S/P SURGERY 2015).   SHE QUIT SMOKING IN 1980 AND SHE DOES NOT DRINK   Family History  Problem Relation Age of Onset  . Diabetes Mother   .  Heart disease Mother   . Hyperlipidemia Mother   . Diabetes Sister   . Hyperlipidemia Sister   . Hypertension Sister   . Diabetes Brother   . Hyperlipidemia Brother   . Hyperlipidemia Sister     Wt Readings from Last 3 Encounters:  06/21/15 195 lb (88.451 kg)  06/18/15 193 lb (87.544 kg)  06/07/15 193 lb 14.4 oz (87.952 kg)    PHYSICAL EXAM BP 130/80 mmHg  Pulse 72  Ht 5' 5"  (1.651 m)  Wt 195 lb (88.451 kg)  BMI 32.45 kg/m2 General: Very pleasant healthy-appearing AAF, NAD, A&Ox4; answers questions appropriately. Normal mood and affect. HEENT: NCAT, EOMI, MMM, anicteric sclerae.  Neck: Supple; no LAN,JVD, or carotid bruit.  Heart: RRR, normal S1, S2. No M/R/G. Nondisplaced PMI.  Lungs: CTAB, nonlabored,normal effort, good air movement.  Abdomen: Soft/NT/ND/NABS. No HSM. Mild obesity.  Extremities: No C/C/E, 2+ equal pulses throughout.  Neuro: CN II-XII Grossly intact. Non-focal    Adult ECG Report - not checked  Other studies Reviewed: Additional studies/ records that were reviewed today include:  Recent Labs:   Lab Results  Component Value Date   CHOL 177 11/22/2014   HDL 76 11/22/2014    LDLCALC 79 11/22/2014   TRIG 109 11/22/2014   CHOLHDL 2.3 11/22/2014    ASSESSMENT / PLAN: Problem List Items Addressed This Visit    Hyperlipidemia (Chronic)    At current she is on moderate dose of atorvastatin. LDL is currently at goal in the absence of CAD, however if her cardiac catheterization is significant for significant CAD, would want to be somewhat more aggressive and increase atorvastatin dose to 40 mg. Also taking coenzyme every 10.      Essential hypertension (Chronic)    Pretty well-controlled on metoprolol and ARB. We recently increased her beta blocker dose, and she is tolerating relatively well.      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 Perhaps even class III angina now. Stress test results indicated atypical chest pain, however would she was describing to me seemed relatively typical they also seemed symptom limiting. Clearly reproduced on the treadmill with ST segment changes providing a high risk Duke treadmill stress score. The images were negative for ischemia, however with the high risk Duke score, I believe that the study was at least the intermediate risk. We discussed options of continued medical management versus invasive evaluation. We both agree that "tie breaking " with cardiac catheterization to confirm or deny the presence of occlusive CAD is warranted.  Procedure:  Left Heart Catheterization with Native Coronary Angiography and Possible Percutaneous Coronary Intervention  The procedure with Risks/Benefits/Alternatives and Indications was reviewed with the patient.  All questions were answered.    Risks / Complications include, but not limited to: Death, MI, CVA/TIA, VF/VT (with defibrillation), Bradycardia (need for temporary pacer placement),  contrast induced nephropathy, bleeding / bruising / hematoma / pseudoaneurysm, vascular or coronary injury (with possible emergent CT or Vascular Surgery), adverse medication reactions, infection.  Additional risks involving the use of radiation with the possibility of radiation burns and cancer were explained in detail.  The patient voices understanding and agree to proceed.         Abnormal stress electrocardiogram test using treadmill  Despite the fact that her images are negative, her treadmill score was quite high having significant ST changes and symptoms that would be probably considered limiting angina as opposed nonlimiting angina. Her symptoms that are described are more consistent with classic anginal symptoms as opposed to atypical symptoms described on the report.  With a high risk Duke treadmill score, and exercise limiting angina, normal imaging, this would be at least considered intermediate risk.      Relevant Orders   APTT   Basic metabolic panel   Protime-INR   CBC   LEFT HEART CATHETERIZATION WITH CORONARY ANGIOGRAM    Other Visit Diagnoses    Pre-op testing        Relevant Orders    APTT    Basic metabolic panel    Protime-INR    CBC    LEFT HEART CATHETERIZATION WITH CORONARY ANGIOGRAM    Clotting disorder ([place])        Relevant Orders    APTT    Protime-INR       Current medicines are reviewed at length with the patient today. (+/- concerns) n/a The following changes have been made: n/a    Studies Ordered:   Orders Placed This Encounter  Procedures  . APTT  . Basic metabolic panel  . Protime-INR  . CBC  . LEFT HEART CATHETERIZATION WITH CORONARY Evelena Asa, Leonie Green, M.D., M.S. Interventional Cardiologist   Pager # [phone] Phone # [phone] [address] [place], [place] [address]

## 2015-06-23 NOTE — Assessment & Plan Note (Addendum)
At current she is on moderate dose of atorvastatin. LDL is currently at goal in the absence of CAD, however if her cardiac catheterization is significant for significant CAD, would want to be somewhat more aggressive and increase atorvastatin dose to 40 mg. Also taking coenzyme every 10.

## 2015-06-23 NOTE — Assessment & Plan Note (Signed)
I'm actually concerned that her symptoms have started to get a little bit worse. Perhaps even class III angina now. Stress test results indicated atypical chest pain, however would she was describing to me seemed relatively typical they also seemed symptom limiting. Clearly reproduced on the treadmill with ST segment changes providing a high risk Duke treadmill stress score. The images were negative for ischemia, however with the high risk Duke score, I believe that the study was at least the intermediate risk. We discussed options of continued medical management versus invasive evaluation. We both agree that "tie breaking " with cardiac catheterization to confirm or deny the presence of occlusive CAD is warranted.  Procedure:  Left Heart Catheterization with Native Coronary Angiography and Possible Percutaneous Coronary Intervention  The procedure with Risks/Benefits/Alternatives and Indications was reviewed with the patient.  All questions were answered.    Risks / Complications include, but not limited to: Death, MI, CVA/TIA, VF/VT (with defibrillation), Bradycardia (need for temporary pacer placement), contrast induced nephropathy, bleeding / bruising / hematoma / pseudoaneurysm, vascular or coronary injury (with possible emergent CT or Vascular Surgery), adverse medication reactions, infection.  Additional risks involving the use of radiation with the possibility of radiation burns and cancer were explained in detail.  The patient voices understanding and agree to proceed.

## 2015-06-23 NOTE — Assessment & Plan Note (Signed)
Despite the fact that her images are negative, her treadmill score was quite high having significant ST changes and symptoms that would be probably considered limiting angina as opposed nonlimiting angina. Her symptoms that are described are more consistent with classic anginal symptoms as opposed to atypical symptoms described on the report.  With a high risk Duke treadmill score, and exercise limiting angina, normal imaging, this would be at least considered intermediate risk.

## 2015-06-23 NOTE — Assessment & Plan Note (Signed)
Pretty well-controlled on metoprolol and ARB. We recently increased her beta blocker dose, and she is tolerating relatively well.

## 2015-06-26 DIAGNOSIS — H401133 Primary open-angle glaucoma, bilateral, severe stage: Secondary | ICD-10-CM | POA: Diagnosis not present

## 2015-06-28 DIAGNOSIS — Z01818 Encounter for other preprocedural examination: Secondary | ICD-10-CM | POA: Diagnosis not present

## 2015-06-28 DIAGNOSIS — R9439 Abnormal result of other cardiovascular function study: Secondary | ICD-10-CM | POA: Diagnosis not present

## 2015-06-28 DIAGNOSIS — D689 Coagulation defect, unspecified: Secondary | ICD-10-CM | POA: Diagnosis not present

## 2015-06-28 DIAGNOSIS — I10 Essential (primary) hypertension: Secondary | ICD-10-CM | POA: Diagnosis not present

## 2015-06-28 DIAGNOSIS — R079 Chest pain, unspecified: Secondary | ICD-10-CM | POA: Diagnosis not present

## 2015-06-28 LAB — CBC
HCT: 34.9 % — ABNORMAL LOW (ref 36.0–46.0)
HEMOGLOBIN: 11.8 g/dL — AB (ref 12.0–15.0)
MCH: 28.4 pg (ref 26.0–34.0)
MCHC: 33.8 g/dL (ref 30.0–36.0)
MCV: 83.9 fL (ref 78.0–100.0)
MPV: 9.5 fL (ref 8.6–12.4)
PLATELETS: 294 10*3/uL (ref 150–400)
RBC: 4.16 MIL/uL (ref 3.87–5.11)
RDW: 15.1 % (ref 11.5–15.5)
WBC: 4.5 10*3/uL (ref 4.0–10.5)

## 2015-06-28 LAB — PROTIME-INR
INR: 1 (ref <1.50)
PROTHROMBIN TIME: 13.3 s (ref 11.6–15.2)

## 2015-06-28 LAB — APTT: aPTT: 33 seconds (ref 24–37)

## 2015-06-29 LAB — BASIC METABOLIC PANEL
BUN: 15 mg/dL (ref 7–25)
CO2: 29 mmol/L (ref 20–31)
CREATININE: 0.69 mg/dL (ref 0.60–0.93)
Calcium: 9.6 mg/dL (ref 8.6–10.4)
Chloride: 104 mmol/L (ref 98–110)
GLUCOSE: 160 mg/dL — AB (ref 65–99)
POTASSIUM: 4.1 mmol/L (ref 3.5–5.3)
Sodium: 142 mmol/L (ref 135–146)

## 2015-07-01 ENCOUNTER — Other Ambulatory Visit: Payer: Self-pay | Admitting: *Deleted

## 2015-07-01 DIAGNOSIS — Z01818 Encounter for other preprocedural examination: Secondary | ICD-10-CM

## 2015-07-01 DIAGNOSIS — R9439 Abnormal result of other cardiovascular function study: Secondary | ICD-10-CM

## 2015-07-02 ENCOUNTER — Telehealth: Payer: Self-pay | Admitting: *Deleted

## 2015-07-02 NOTE — Telephone Encounter (Signed)
Spoke to patient. Result given . Verbalized understanding NO FASTING LAB PER PATIENT

## 2015-07-02 NOTE — Telephone Encounter (Signed)
-----   Message from Leonie Man, MD sent at 07/01/2015 10:27 PM EST ----- Labs are ok for cath.  Glucose is high - need to review Diabetes management with PCP.  Leonie Man, MD

## 2015-07-05 ENCOUNTER — Encounter (HOSPITAL_COMMUNITY): Payer: Self-pay | Admitting: Cardiology

## 2015-07-05 ENCOUNTER — Encounter (HOSPITAL_COMMUNITY)
Admission: RE | Disposition: A | Discharge status: Home / Self Care | Payer: Self-pay | Source: Ambulatory Visit | Attending: Cardiology

## 2015-07-05 ENCOUNTER — Ambulatory Visit (HOSPITAL_COMMUNITY)
Admission: RE | Admit: 2015-07-05 | Discharge: 2015-07-05 | Disposition: A | Discharge status: Home / Self Care | Payer: Medicare Other | Source: Ambulatory Visit | Attending: Cardiology | Admitting: Cardiology

## 2015-07-05 DIAGNOSIS — I209 Angina pectoris, unspecified: Secondary | ICD-10-CM | POA: Diagnosis not present

## 2015-07-05 DIAGNOSIS — Z8249 Family history of ischemic heart disease and other diseases of the circulatory system: Secondary | ICD-10-CM | POA: Insufficient documentation

## 2015-07-05 DIAGNOSIS — E785 Hyperlipidemia, unspecified: Secondary | ICD-10-CM | POA: Diagnosis not present

## 2015-07-05 DIAGNOSIS — E119 Type 2 diabetes mellitus without complications: Secondary | ICD-10-CM | POA: Insufficient documentation

## 2015-07-05 DIAGNOSIS — R9439 Abnormal result of other cardiovascular function study: Secondary | ICD-10-CM | POA: Diagnosis present

## 2015-07-05 DIAGNOSIS — Z7984 Long term (current) use of oral hypoglycemic drugs: Secondary | ICD-10-CM | POA: Insufficient documentation

## 2015-07-05 DIAGNOSIS — M1990 Unspecified osteoarthritis, unspecified site: Secondary | ICD-10-CM | POA: Insufficient documentation

## 2015-07-05 DIAGNOSIS — I119 Hypertensive heart disease without heart failure: Secondary | ICD-10-CM | POA: Diagnosis present

## 2015-07-05 DIAGNOSIS — Z87891 Personal history of nicotine dependence: Secondary | ICD-10-CM | POA: Insufficient documentation

## 2015-07-05 DIAGNOSIS — E669 Obesity, unspecified: Secondary | ICD-10-CM | POA: Insufficient documentation

## 2015-07-05 DIAGNOSIS — R079 Chest pain, unspecified: Secondary | ICD-10-CM | POA: Diagnosis present

## 2015-07-05 DIAGNOSIS — Z6832 Body mass index (BMI) 32.0-32.9, adult: Secondary | ICD-10-CM | POA: Insufficient documentation

## 2015-07-05 DIAGNOSIS — Z7982 Long term (current) use of aspirin: Secondary | ICD-10-CM | POA: Insufficient documentation

## 2015-07-05 DIAGNOSIS — M810 Age-related osteoporosis without current pathological fracture: Secondary | ICD-10-CM | POA: Diagnosis not present

## 2015-07-05 DIAGNOSIS — H409 Unspecified glaucoma: Secondary | ICD-10-CM | POA: Insufficient documentation

## 2015-07-05 DIAGNOSIS — Z01818 Encounter for other preprocedural examination: Secondary | ICD-10-CM

## 2015-07-05 DIAGNOSIS — E1169 Type 2 diabetes mellitus with other specified complication: Secondary | ICD-10-CM | POA: Diagnosis present

## 2015-07-05 HISTORY — PX: CARDIAC CATHETERIZATION: SHX172

## 2015-07-05 LAB — GLUCOSE, CAPILLARY
GLUCOSE-CAPILLARY: 174 mg/dL — AB (ref 65–99)
GLUCOSE-CAPILLARY: 137 mg/dL — AB (ref 65–99)

## 2015-07-05 SURGERY — LEFT HEART CATH AND CORONARY ANGIOGRAPHY
Anesthesia: LOCAL

## 2015-07-05 MED ORDER — IOHEXOL 350 MG/ML SOLN
INTRAVENOUS | Status: DC | PRN
  Administered 2015-07-05: 50 mL via INTRA_ARTERIAL

## 2015-07-05 MED ORDER — FENTANYL CITRATE (PF) 100 MCG/2ML IJ SOLN
INTRAMUSCULAR | Status: DC | PRN
  Administered 2015-07-05: 25 ug via INTRAVENOUS

## 2015-07-05 MED ORDER — SODIUM CHLORIDE 0.9% FLUSH: 3.0000 mL | INTRAVENOUS | Status: DC | PRN

## 2015-07-05 MED ORDER — ONDANSETRON HCL 4 MG/2ML IJ SOLN: 4.0000 mg | Freq: Four times a day (QID) | INTRAMUSCULAR | Status: DC | PRN

## 2015-07-05 MED ORDER — HEPARIN SODIUM (PORCINE) 1000 UNIT/ML IJ SOLN
INTRAMUSCULAR | Status: AC
  Filled 2015-07-05: qty 1

## 2015-07-05 MED ORDER — LIDOCAINE HCL (PF) 1 % IJ SOLN
INTRAMUSCULAR | Status: AC
  Filled 2015-07-05: qty 30

## 2015-07-05 MED ORDER — FENTANYL CITRATE (PF) 100 MCG/2ML IJ SOLN
INTRAMUSCULAR | Status: AC
  Filled 2015-07-05: qty 2

## 2015-07-05 MED ORDER — MIDAZOLAM HCL 2 MG/2ML IJ SOLN
INTRAMUSCULAR | Status: AC
  Filled 2015-07-05: qty 2

## 2015-07-05 MED ORDER — SODIUM CHLORIDE 0.9 % IV SOLN: 250.0000 mL | INTRAVENOUS | Status: DC | PRN

## 2015-07-05 MED ORDER — HEPARIN SODIUM (PORCINE) 1000 UNIT/ML IJ SOLN
INTRAMUSCULAR | Status: DC | PRN
Start: 2015-07-05 — End: 2015-07-05
  Administered 2015-07-05: 4000 [IU] via INTRAVENOUS

## 2015-07-05 MED ORDER — VERAPAMIL HCL 2.5 MG/ML IV SOLN
INTRAVENOUS | Status: AC
  Filled 2015-07-05: qty 2

## 2015-07-05 MED ORDER — LIDOCAINE HCL (PF) 1 % IJ SOLN
INTRAMUSCULAR | Status: DC | PRN
  Administered 2015-07-05: 5 mL via SUBCUTANEOUS

## 2015-07-05 MED ORDER — MORPHINE SULFATE (PF) 2 MG/ML IV SOLN: 1.0000 mg | INTRAVENOUS | Status: DC | PRN

## 2015-07-05 MED ORDER — HEPARIN (PORCINE) IN NACL 2-0.9 UNIT/ML-% IJ SOLN
INTRAMUSCULAR | Status: DC | PRN
  Administered 2015-07-05: 10:00:00 via INTRA_ARTERIAL

## 2015-07-05 MED ORDER — SODIUM CHLORIDE 0.9 % IV SOLN
INTRAVENOUS | Status: DC
  Administered 2015-07-05: 08:00:00 via INTRAVENOUS

## 2015-07-05 MED ORDER — SODIUM CHLORIDE 0.9% FLUSH: 3.0000 mL | Freq: Two times a day (BID) | INTRAVENOUS | Status: DC

## 2015-07-05 MED ORDER — MIDAZOLAM HCL 2 MG/2ML IJ SOLN
INTRAMUSCULAR | Status: DC | PRN
  Administered 2015-07-05: 2 mg via INTRAVENOUS

## 2015-07-05 MED ORDER — HEPARIN (PORCINE) IN NACL 2-0.9 UNIT/ML-% IJ SOLN
INTRAMUSCULAR | Status: DC | PRN
  Administered 2015-07-05: 10:00:00

## 2015-07-05 MED ORDER — ACETAMINOPHEN 325 MG PO TABS: 650.0000 mg | ORAL_TABLET | ORAL | Status: DC | PRN

## 2015-07-05 MED ORDER — SODIUM CHLORIDE 0.9 % WEIGHT BASED INFUSION: 3.0000 mL/kg/h | INTRAVENOUS | Status: DC

## 2015-07-05 MED ORDER — HEPARIN (PORCINE) IN NACL 2-0.9 UNIT/ML-% IJ SOLN
INTRAMUSCULAR | Status: AC
  Filled 2015-07-05: qty 1000

## 2015-07-05 SURGICAL SUPPLY — 11 items
CATH INFINITI 5 FR JL3.5 (CATHETERS) ×1 IMPLANT
CATH INFINITI JR4 5F (CATHETERS) ×1 IMPLANT
DEVICE RAD COMP TR BAND LRG (VASCULAR PRODUCTS) ×2 IMPLANT
GLIDESHEATH SLEND A-KIT 6F 22G (SHEATH) ×2 IMPLANT
GLIDESHEATH SLEND SS 6F .021 (SHEATH) ×1 IMPLANT
KIT HEART LEFT (KITS) ×2 IMPLANT
PACK CARDIAC CATHETERIZATION (CUSTOM PROCEDURE TRAY) ×2 IMPLANT
TRANSDUCER W/STOPCOCK (MISCELLANEOUS) ×2 IMPLANT
TUBING CIL FLEX 10 FLL-RA (TUBING) ×2 IMPLANT
WIRE HI TORQ VERSACORE-J 145CM (WIRE) ×1 IMPLANT
WIRE SAFE-T 1.5MM-J .035X260CM (WIRE) ×2 IMPLANT

## 2015-07-05 NOTE — Discharge Instructions (Signed)
Angiogram, Care After °Refer to this sheet in the next few weeks. These instructions provide you with information about caring for yourself after your procedure. Your health care provider may also give you more specific instructions. Your treatment has been planned according to current medical practices, but problems sometimes occur. Call your health care provider if you have any problems or questions after your procedure. °WHAT TO EXPECT AFTER THE PROCEDURE °After your procedure, it is typical to have the following: °· Bruising at the catheter insertion site that usually fades within 1-2 weeks. °· Blood collecting in the tissue (hematoma) that may be painful to the touch. It should usually decrease in size and tenderness within 1-2 weeks. °HOME CARE INSTRUCTIONS °· Take medicines only as directed by your health care provider. °· You may shower 24-48 hours after the procedure or as directed by your health care provider. Remove the bandage (dressing) and gently wash the site with plain soap and water. Pat the area dry with a clean towel. Do not rub the site, because this may cause bleeding. °· Do not take baths, swim, or use a hot tub until your health care provider approves. °· Check your insertion site every day for redness, swelling, or drainage. °· Do not apply powder or lotion to the site. °· Do not lift over 10 lb (4.5 kg) for 5 days after your procedure or as directed by your health care provider. °· Ask your health care provider when it is okay to: °¨ Return to work or school. °¨ Resume usual physical activities or sports. °¨ Resume sexual activity. °· Do not drive home if you are discharged the same day as the procedure. Have someone else drive you. °· You may drive 24 hours after the procedure unless otherwise instructed by your health care provider. °· Do not operate machinery or power tools for 24 hours after the procedure or as directed by your health care provider. °· If your procedure was done as an  outpatient procedure, which means that you went home the same day as your procedure, a responsible adult should be with you for the first 24 hours after you arrive home. °· Keep all follow-up visits as directed by your health care provider. This is important. °SEEK MEDICAL CARE IF: °· You have a fever. °· You have chills. °· You have increased bleeding from the catheter insertion site. Hold pressure on the site. °SEEK IMMEDIATE MEDICAL CARE IF: °· You have unusual pain at the catheter insertion site. °· You have redness, warmth, or swelling at the catheter insertion site. °· You have drainage (other than a small amount of blood on the dressing) from the catheter insertion site. °· The catheter insertion site is bleeding, and the bleeding does not stop after 30 minutes of holding steady pressure on the site. °· The area near or just beyond the catheter insertion site becomes pale, cool, tingly, or numb. °  °This information is not intended to replace advice given to you by your health care provider. Make sure you discuss any questions you have with your health care provider. °  °Document Released: 12/04/2004 Document Revised: 06/08/2014 Document Reviewed: 10/19/2012 °Elsevier Interactive Patient Education ©2016 [employer] °Radial Site Care °Refer to this sheet in the next few weeks. These instructions provide you with information about caring for yourself after your procedure. Your health care provider may also give you more specific instructions. Your treatment has been planned according to current medical practices, but problems sometimes occur. Call your   health care provider if you have any problems or questions after your procedure. °WHAT TO EXPECT AFTER THE PROCEDURE °After your procedure, it is typical to have the following: °· Bruising at the radial site that usually fades within 1-2 weeks. °· Blood collecting in the tissue (hematoma) that may be painful to the touch. It should usually decrease in size and  tenderness within 1-2 weeks. °HOME CARE INSTRUCTIONS °· Take medicines only as directed by your health care provider. °· You may shower 24-48 hours after the procedure or as directed by your health care provider. Remove the bandage (dressing) and gently wash the site with plain soap and water. Pat the area dry with a clean towel. Do not rub the site, because this may cause bleeding. °· Do not take baths, swim, or use a hot tub until your health care provider approves. °· Check your insertion site every day for redness, swelling, or drainage. °· Do not apply powder or lotion to the site. °· Do not flex or bend the affected arm for 24 hours or as directed by your health care provider. °· Do not push or pull heavy objects with the affected arm for 24 hours or as directed by your health care provider. °· Do not lift over 10 lb (4.5 kg) for 5 days after your procedure or as directed by your health care provider. °· Ask your health care provider when it is okay to: °¨ Return to work or school. °¨ Resume usual physical activities or sports. °¨ Resume sexual activity. °· Do not drive home if you are discharged the same day as the procedure. Have someone else drive you. °· You may drive 24 hours after the procedure unless otherwise instructed by your health care provider. °· Do not operate machinery or power tools for 24 hours after the procedure. °· If your procedure was done as an outpatient procedure, which means that you went home the same day as your procedure, a responsible adult should be with you for the first 24 hours after you arrive home. °· Keep all follow-up visits as directed by your health care provider. This is important. °SEEK MEDICAL CARE IF: °· You have a fever. °· You have chills. °· You have increased bleeding from the radial site. Hold pressure on the site. °SEEK IMMEDIATE MEDICAL CARE IF: °· You have unusual pain at the radial site. °· You have redness, warmth, or swelling at the radial site. °· You  have drainage (other than a small amount of blood on the dressing) from the radial site. °· The radial site is bleeding, and the bleeding does not stop after 30 minutes of holding steady pressure on the site. °· Your arm or hand becomes pale, cool, tingly, or numb. °  °This information is not intended to replace advice given to you by your health care provider. Make sure you discuss any questions you have with your health care provider. °  °Document Released: 06/20/2010 Document Revised: 06/08/2014 Document Reviewed: 12/04/2013 °Elsevier Interactive Patient Education ©2016 [employer] ° °

## 2015-07-05 NOTE — H&P (View-Only) (Signed)
PCP: Mani,Mario, PA-C  Clinic Note: Chief Complaint  Patient presents with  . Follow-up    no chest pain, no shortness of breath, no swelling, no cramping, no dizziness or lightheadedness    HPI: Angela Chambers is a 75 y.o. female with a PMH below who presents today for close f/u after Myoview ordered for SSx concerning for new onset exertional Angina (Class II-III). In the past she is had a preoperative stress test for knee surgery that was negative for ischemia.  Angela Chambers was last seen on 06/07/2015 with a complaint of worsening symptoms of exertional chest tightness and dyspnea. This was associated with moderate level of activity (class II) basically she was on a trip with her husband, and trouble walking fast to try keeping up with him she began noticing this chest tightness and dyspnea that resolved with resting. However since then, she has noted that were working out on her trip treadmill or stationary bicycle, if she exerts herself beyond a certain level of heart rate, she had recurrence of symptoms that resolved after reducing her level of exercise. She was referred for a treadmill Myoview (symptom limited as she had been on metoprolol succinate).  Recent Hospitalizations: None  Studies Reviewed:   Exercise Myoview: Exercise for 6 minutes, 7 METS (93% MPHR) --> exercise was stopped due to chest discomfort and dyspnea (non-limiting angina), notable ST depressions noted during stress => high risk to Duke Treadmill score. However no evidence of ischemia or infarction on perfusion images. EF 74%.  Interval History:  She now presents for follow-up after these results. She is still noticing her exertional chest discomfort and dyspnea that seems more limiting now. Perhaps more class III. His cost her stress test. She said she definitely had her concerning symptoms with exercise during that test. She has not had any resting symptoms of chest tightness or pressure. No resting  dyspnea.  No PND, orthopnea or edema. No palpitations, lightheadedness, dizziness, weakness or syncope/near syncope. No TIA/amaurosis fugax symptoms. No claudication.  ROS: A comprehensive was performed. Review of Systems  Constitutional: Negative for malaise/fatigue.  HENT: Negative for nosebleeds.   Respiratory: Positive for shortness of breath (Exertional). Negative for cough and wheezing.   Gastrointestinal: Negative for blood in stool and melena.  Genitourinary: Negative for hematuria.  Musculoskeletal: Negative.   Neurological: Negative for dizziness, weakness and headaches.  Endo/Heme/Allergies: Does not bruise/bleed easily.  Psychiatric/Behavioral: The patient is nervous/anxious (very nervous about new onset of symptoms).   All other systems reviewed and are negative.   Past Medical History  Diagnosis Date  . Hyperlipidemia   . Diabetes mellitus   . Glaucoma   . Fibroids   . Arthritis   . Cataract   . Osteoporosis   . Palpitations     CONTROLLED ON BETA BLOCKER  . Hypertension 07 /2013     WITH EF GREATER THAN 55 % WITH MILD MR BUT NO PROLASPE ,ESSENTIALLY NORMAL DONE TO EVALUATE PALPITATIONS .  Marland Kitchen Obesity (BMI 30.0-34.9)     Past Surgical History  Procedure Laterality Date  . Knee surgery  1984    per pt screws in R knee  . 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 Abdominal hysterectomy  10/18/ 1982    TAH-BSO for fibroids and adenomatous hyperplasia  . Tubal ligation    . 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  Persantine myoview  02/ 2011     SHOWED AN APICAL DEFECT BUT NO REVERSIBILITY THOUGHT TO BE DUE TO BREAST ATTENUATION .  Marland Kitchen Transthoracic echocardiogram  July 2013    EF greater than 55%, mild MR no prolapse.  Normal  . Cpet-met test (cardiopulmonary exercise test)  03/06/2013    Adequate effort at 1.11. Peak VO2 12.3 is 86% in the normal range. Heart rate was just outside the  normal range target being 127 beats a minute but this was 86% of projected. She did have an ischemic pattern after the anterograde threshold with increased heart rate and blunting of stroke volume during the last 3 minutes of exercise, but was asymptomatic.  . 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 Authorizing Provider  amLODipine (NORVASC) 5 MG tablet Take 1 tablet (5 mg total) by mouth daily. 04/08/15   [name], PA-C  aspirin 81 MG tablet Take 81 mg by mouth daily.    Historical Provider, MD  atorvastatin (LIPITOR) 20 MG tablet TAKE 1 TABLET BY MOUTH ONCE DAILY 05/28/15   [name], PA-C  Co-Enzyme Q-10 30 MG CAPS Take 30 mg by mouth daily.    Historical Provider, MD  hydrochlorothiazide (HYDRODIURIL) 12.5 MG tablet Take 1 tablet (12.5 mg total) by mouth daily. 02/28/15   [name], PA-C  losartan (COZAAR) 100 MG tablet Take 1 tablet (100 mg total) by mouth daily. 04/04/15   [name], PA-C  metFORMIN (GLUCOPHAGE) 500 MG tablet Take 1 tablet (500 mg total) by mouth 2 (two) times daily with a meal. 04/04/15   [name], PA-C  metoprolol succinate (TOPROL-XL) 50 MG 24 hr tablet Take 1 tablet (50 mg total) by mouth daily. 06/07/15   Leonie Man, MD  Multiple Vitamin (MULTIVITAMIN) tablet Take 1 tablet by mouth daily.    Historical Provider, MD  OVER THE COUNTER MEDICATION Co Q 10 taking once daily    Historical Provider, MD  PRESCRIPTION MEDICATION Timolol 0.05 % taking 1 drop to each eye every am    Historical Provider, MD  timolol (TIMOPTIC) 0.5 % ophthalmic solution Place into both eyes every morning. 04/30/14   Historical Provider, MD  Travoprost, BAK Free, (TRAVATAN) 0.004 % SOLN ophthalmic solution Place 1 drop into both eyes at bedtime.    Historical Provider, MD    Allergies  Allergen Reactions  . Lisinopril Cough    Social History   Social History  . Marital Status: Married    Spouse Name: N/A  . Number of Children: N/A  . Years of Education: N/A   Occupational History  . retired    Social History Main Topics  . Smoking status: Former Smoker -- 0.50 packs/day for 20 years    Types: Cigarettes    Quit date: 07/13/1981  . Smokeless tobacco: Never Used  . Alcohol Use: No  . Drug Use: No  . Sexual Activity: Yes   Other Topics Concern  . None   Social History Narrative   SHE IS MARRIED ,MOTHER OF 4, GRANDMOTHER OF 6. SHE DOES EXERCISE BUT SOMEWHAT LIMITED BECAUSE OF HER KNEE (S/P SURGERY 2015).   SHE QUIT SMOKING IN 1980 AND SHE DOES NOT DRINK   Family History  Problem Relation Age of Onset  . Diabetes Mother   .  Heart disease Mother   . Hyperlipidemia Mother   . Diabetes Sister   . Hyperlipidemia Sister   . Hypertension Sister   . Diabetes Brother   . Hyperlipidemia Brother   . Hyperlipidemia Sister     Wt Readings from Last 3 Encounters:  06/21/15 195 lb (88.451 kg)  06/18/15 193 lb (87.544 kg)  06/07/15 193 lb 14.4 oz (87.952 kg)    PHYSICAL EXAM BP 130/80 mmHg  Pulse 72  Ht 5' 5"  (1.651 m)  Wt 195 lb (88.451 kg)  BMI 32.45 kg/m2 General: Very pleasant healthy-appearing AAF, NAD, A&Ox4; answers questions appropriately. Normal mood and affect. HEENT: NCAT, EOMI, MMM, anicteric sclerae.  Neck: Supple; no LAN,JVD, or carotid bruit.  Heart: RRR, normal S1, S2. No M/R/G. Nondisplaced PMI.  Lungs: CTAB, nonlabored,normal effort, good air movement.  Abdomen: Soft/NT/ND/NABS. No HSM. Mild obesity.  Extremities: No C/C/E, 2+ equal pulses throughout.  Neuro: CN II-XII Grossly intact. Non-focal    Adult ECG Report - not checked  Other studies Reviewed: Additional studies/ records that were reviewed today include:  Recent Labs:   Lab Results  Component Value Date   CHOL 177 11/22/2014   HDL 76 11/22/2014    LDLCALC 79 11/22/2014   TRIG 109 11/22/2014   CHOLHDL 2.3 11/22/2014    ASSESSMENT / PLAN: Problem List Items Addressed This Visit    Hyperlipidemia (Chronic)    At current she is on moderate dose of atorvastatin. LDL is currently at goal in the absence of CAD, however if her cardiac catheterization is significant for significant CAD, would want to be somewhat more aggressive and increase atorvastatin dose to 40 mg. Also taking coenzyme every 10.      Essential hypertension (Chronic)    Pretty well-controlled on metoprolol and ARB. We recently increased her beta blocker dose, and she is tolerating relatively well.      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 Perhaps even class III angina now. Stress test results indicated atypical chest pain, however would she was describing to me seemed relatively typical they also seemed symptom limiting. Clearly reproduced on the treadmill with ST segment changes providing a high risk Duke treadmill stress score. The images were negative for ischemia, however with the high risk Duke score, I believe that the study was at least the intermediate risk. We discussed options of continued medical management versus invasive evaluation. We both agree that "tie breaking " with cardiac catheterization to confirm or deny the presence of occlusive CAD is warranted.  Procedure:  Left Heart Catheterization with Native Coronary Angiography and Possible Percutaneous Coronary Intervention  The procedure with Risks/Benefits/Alternatives and Indications was reviewed with the patient.  All questions were answered.    Risks / Complications include, but not limited to: Death, MI, CVA/TIA, VF/VT (with defibrillation), Bradycardia (need for temporary pacer placement),  contrast induced nephropathy, bleeding / bruising / hematoma / pseudoaneurysm, vascular or coronary injury (with possible emergent CT or Vascular Surgery), adverse medication reactions, infection.  Additional risks involving the use of radiation with the possibility of radiation burns and cancer were explained in detail.  The patient voices understanding and agree to proceed.         Abnormal stress electrocardiogram test using treadmill  Despite the fact that her images are negative, her treadmill score was quite high having significant ST changes and symptoms that would be probably considered limiting angina as opposed nonlimiting angina. Her symptoms that are described are more consistent with classic anginal symptoms as opposed to atypical symptoms described on the report.  With a high risk Duke treadmill score, and exercise limiting angina, normal imaging, this would be at least considered intermediate risk.      Relevant Orders   APTT   Basic metabolic panel   Protime-INR   CBC   LEFT HEART CATHETERIZATION WITH CORONARY ANGIOGRAM    Other Visit Diagnoses    Pre-op testing        Relevant Orders    APTT    Basic metabolic panel    Protime-INR    CBC    LEFT HEART CATHETERIZATION WITH CORONARY ANGIOGRAM    Clotting disorder ([place])        Relevant Orders    APTT    Protime-INR       Current medicines are reviewed at length with the patient today. (+/- concerns) n/a The following changes have been made: n/a    Studies Ordered:   Orders Placed This Encounter  Procedures  . APTT  . Basic metabolic panel  . Protime-INR  . CBC  . LEFT HEART CATHETERIZATION WITH CORONARY Evelena Asa, Leonie Green, M.D., M.S. Interventional Cardiologist   Pager # [phone] Phone # [phone] [address]. [place], [place] [address]

## 2015-07-05 NOTE — Interval H&P Note (Signed)
History and Physical Interval Note:  07/05/2015 9:22 AM  Angela Chambers  has presented today for surgery, with the diagnosis of Exertional Chest Pain / Class II Angina with abnormal TM portion of Nuclear Stress Test.  The various methods of treatment have been discussed with the patient and family. After consideration of risks, benefits and other options for treatment, the patient has consented to  Procedure(s): Left Heart Cath and Coronary Angiography (N/A) as a surgical intervention .  The patient's history has been reviewed, patient examined, no change in status, stable for surgery.  I have reviewed the patient's chart and labs.  Questions were answered to the patient's satisfaction.      Cath Lab Visit (complete for each Cath Lab visit)  Clinical Evaluation Leading to the Procedure:   ACS: No.  Non-ACS:    Anginal Classification: CCS II  Anti-ischemic medical therapy: Maximal Therapy (2 or more classes of medications)  Non-Invasive Test Results: Equivocal test results  Prior CABG: No previous CABG  Ischemic Symptoms? CCS II (Slight limitation of ordinary activity) Anti-ischemic Medical Therapy? Maximal Medical Therapy (2 or more classes of medications) Non-invasive Test Results? Equivocal test results Prior CABG? No Previous CABG   Patient Information:   1-2V-CAD with DS 50-60% With FFR  U (6)  Indication: 22; Score: 6   Patient Information:   1-2V-CAD with DS 50-60% With FFR>0.8, IVUS not significant  I (2)  Indication: 23; Score: 2   Patient Information:   3V-CAD without LMCA With Abnormal LV systolic function  A (9)  Indication: 48; Score: 9   Patient Information:   LMCA-CAD  A (9)  Indication: 49; Score: 9   HARDING, DAVID W  Leonie Man, M.D., M.S. Interventional Cardiologist   Pager # [phone] Phone # [phone] [address] [place], [place] [address]

## 2015-07-08 ENCOUNTER — Encounter (HOSPITAL_COMMUNITY): Payer: Self-pay | Admitting: Cardiology

## 2015-07-25 ENCOUNTER — Ambulatory Visit (INDEPENDENT_AMBULATORY_CARE_PROVIDER_SITE_OTHER): Payer: Medicare Other | Admitting: Cardiology

## 2015-07-25 ENCOUNTER — Ambulatory Visit: Payer: Medicare Other | Admitting: Cardiology

## 2015-07-25 ENCOUNTER — Encounter: Payer: Self-pay | Admitting: Cardiology

## 2015-07-25 VITALS — BP 144/88 | HR 78 | Ht 65.0 in | Wt 195.4 lb

## 2015-07-25 DIAGNOSIS — I209 Angina pectoris, unspecified: Secondary | ICD-10-CM

## 2015-07-25 DIAGNOSIS — I10 Essential (primary) hypertension: Secondary | ICD-10-CM

## 2015-07-25 DIAGNOSIS — E785 Hyperlipidemia, unspecified: Secondary | ICD-10-CM | POA: Diagnosis not present

## 2015-07-25 DIAGNOSIS — E66811 Obesity, class 1: Secondary | ICD-10-CM

## 2015-07-25 DIAGNOSIS — E669 Obesity, unspecified: Secondary | ICD-10-CM

## 2015-07-25 DIAGNOSIS — R9439 Abnormal result of other cardiovascular function study: Secondary | ICD-10-CM

## 2015-07-25 DIAGNOSIS — R002 Palpitations: Secondary | ICD-10-CM

## 2015-07-25 DIAGNOSIS — R079 Chest pain, unspecified: Secondary | ICD-10-CM

## 2015-07-25 MED ORDER — HYDROCHLOROTHIAZIDE 25 MG PO TABS: 25.0000 mg | ORAL_TABLET | Freq: Every day | ORAL | Status: DC

## 2015-07-25 NOTE — Progress Notes (Signed)
PCP: Mani,Mario, PA-C  Clinic Note: Chief Complaint  Patient presents with  . Follow-up    CATH//pt states no Sx.  Marland Kitchen Shortness of Breath    HPI: Angela Chambers is a 75 y.o. female with a PMH below who presents today for close f/u after Myoview ordered for SSx concerning for new onset exertional Angina (Class II-III). In the past she is had a preoperative stress test for knee surgery that was negative for ischemia.  Angela Chambers was last seen on 06/07/2015 with a complaint of worsening symptoms of exertional chest tightness and dyspnea. This was associated with moderate level of activity (class II) basically she was on a trip with her husband, and trouble walking fast to try keeping up with him she began noticing this chest tightness and dyspnea that resolved with resting. However since then, she has noted that were working out on her trip treadmill or stationary bicycle, if she exerts herself beyond a certain level of heart rate, she had recurrence of symptoms that resolved after reducing her level of exercise. She was referred for a treadmill Myoview (symptom limited as she had been on metoprolol succinate).  Recent Hospitalizations: None  Studies Reviewed:   Exercise Myoview: Exercise for 6 minutes, 7 METS (93% MPHR) --> exercise was stopped due to chest discomfort and dyspnea (non-limiting angina), notable ST depressions noted during stress => high risk to Duke Treadmill score. However no evidence of ischemia or infarction on perfusion images. EF 74%.  Cath 07/05/15:  Conclusion     Angiographically normal, however tortuous coronary arteries  The left ventricular systolic function is normal.   Patient clearly has tortuous vessels indicating likely hypertensive heart disease. It is quite possible that her exertional chest pain is related to microvascular ischemia as it would not show up on either cardiac catheter or stress test.  Plan will be to continue aggressive  hypertension management for hypertensive heart disease.        Interval History:  She now presents for follow-up after these results. Thankfully since her cath was performed, she seems to be doing relatively well. She is now back exercising. She is not noticing nearly as much of the way of limiting dyspnea or chest discomfort. She is probably not exerting herself to the extent that she was before, but is still working out at [employer]. He is doing both a treadmill and other machines. Otherwise from a cardiac standpoint, she denies any PND, orthopnea or edema.  No palpitations, lightheadedness, dizziness, weakness or syncope/near syncope. No TIA/amaurosis fugax symptoms. No claudication.  ROS: A comprehensive was performed. Review of Systems  Constitutional: Negative for malaise/fatigue.  HENT: Negative for nosebleeds.   Respiratory: Positive for shortness of breath (Exertional -= but notably improved). Negative for cough and wheezing.   Cardiovascular: Negative for claudication.  Gastrointestinal: Negative for blood in stool and melena.  Genitourinary: Negative for hematuria.  Musculoskeletal: Negative.   Neurological: Negative for dizziness, weakness and headaches.  Endo/Heme/Allergies: Does not bruise/bleed easily.  Psychiatric/Behavioral: The patient is not nervous/anxious (Very much relieved of the results of her Cardiac cath).   All other systems reviewed and are negative.   Past Medical History  Diagnosis Date  . Hyperlipidemia   . Diabetes mellitus   . Glaucoma   . Fibroids   . Arthritis   . Cataract   . Osteoporosis   . Palpitations     CONTROLLED ON BETA BLOCKER  . Hypertension 07 /2013     WITH  EF GREATER THAN 55 % WITH MILD MR BUT NO PROLASPE ,ESSENTIALLY NORMAL DONE TO EVALUATE PALPITATIONS .  Marland Kitchen Obesity (BMI 30.0-34.9)     Past Surgical History  Procedure Laterality Date  . Knee surgery  1984    per pt screws in R knee  . Joint replacement  09/05/2009    L  knee  . Abdominal hysterectomy  10/18/ 1982    TAH-BSO for fibroids and adenomatous hyperplasia  . Tubal ligation    . 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 Persantine myoview  02/ 2011     SHOWED AN APICAL DEFECT BUT NO REVERSIBILITY THOUGHT TO BE DUE TO BREAST ATTENUATION .  Marland Kitchen Transthoracic echocardiogram  July 2013    EF greater than 55%, mild MR no prolapse.  Normal  . Cpet-met test (cardiopulmonary exercise test)  03/06/2013    Adequate effort at 1.11. Peak VO2 12.3 is 86% in the normal range. Heart rate was just outside the normal range target being 127 beats a minute but this was 86% of projected. She did have an ischemic pattern after the anterograde threshold with increased heart rate and blunting of stroke volume during the last 3 minutes of exercise, but was asymptomatic.  . Total knee arthroplasty Right 09/25/2013    Procedure: RIGHT TOTAL KNEE ARTHROPLASTY;  Surgeon: Gearlean Alf, MD;  Location: [facility];  Service: Orthopedics;  Laterality: Right;  . Cardiac catheterization N/A 07/05/2015    Procedure: Left Heart Cath and Coronary Angiography;  Surgeon: Leonie Man, MD;  Location: [facility];  Service: Cardiovascular;  Laterality: N/A;    Prior to Admission medications   Medication Sig Start Date End Date Taking? Authorizing Provider  amLODipine (NORVASC) 5 MG tablet Take 1 tablet (5 mg total) by mouth daily. 04/08/15   [name], PA-C  aspirin 81 MG tablet Take 81 mg by mouth daily.    Historical Provider, MD  atorvastatin (LIPITOR) 20 MG tablet TAKE 1 TABLET BY MOUTH ONCE DAILY 05/28/15   [name], PA-C  Co-Enzyme Q-10 30 MG CAPS Take 30 mg by mouth daily.    Historical Provider, MD  hydrochlorothiazide (HYDRODIURIL) 12.5 MG tablet Take 1 tablet (12.5 mg total) by mouth daily. 02/28/15   [name], PA-C  losartan (COZAAR) 100 MG tablet Take 1 tablet (100 mg total) by mouth daily. 04/04/15    [name], PA-C  metFORMIN (GLUCOPHAGE) 500 MG tablet Take 1 tablet (500 mg total) by mouth 2 (two) times daily with a meal. 04/04/15   [name], PA-C  metoprolol succinate (TOPROL-XL) 50 MG 24 hr tablet Take 1 tablet (50 mg total) by mouth daily. 06/07/15   Leonie Man, MD  Multiple Vitamin (MULTIVITAMIN) tablet Take 1 tablet by mouth daily.    Historical Provider, MD  OVER THE COUNTER MEDICATION Co Q 10 taking once daily    Historical Provider, MD  PRESCRIPTION MEDICATION Timolol 0.05 % taking 1 drop to each eye every am    Historical Provider, MD  timolol (TIMOPTIC) 0.5 % ophthalmic solution Place into both eyes every morning. 04/30/14   Historical Provider, MD  Travoprost, BAK Free, (TRAVATAN) 0.004 % SOLN ophthalmic solution Place 1 drop into both eyes at bedtime.    Historical Provider, MD   Allergies  Allergen Reactions  . Lisinopril Cough    Social History   Social History  . Marital  Status: Married    Spouse Name: N/A  . Number of Children: N/A  . Years of Education: N/A   Occupational History  . retired    Social History Main Topics  . Smoking status: Former Smoker -- 0.50 packs/day for 20 years    Types: Cigarettes    Quit date: 07/13/1981  . Smokeless tobacco: Never Used  . Alcohol Use: No  . Drug Use: No  . Sexual Activity: Yes   Other Topics Concern  . None   Social History Narrative   SHE IS MARRIED ,MOTHER OF 4, GRANDMOTHER OF 6. SHE DOES EXERCISE BUT SOMEWHAT LIMITED BECAUSE OF HER KNEE (S/P SURGERY 2015).   SHE QUIT SMOKING IN 1980 AND SHE DOES NOT DRINK   Family History  Problem Relation Age of Onset  . Diabetes Mother   . Heart disease Mother   . Hyperlipidemia Mother   . Diabetes Sister   . Hyperlipidemia Sister   . Hypertension Sister   . Diabetes Brother   . Hyperlipidemia Brother   . Hyperlipidemia Sister     Wt Readings from Last 3 Encounters:  07/25/15 195 lb 6.4 oz (88.633 kg)  07/05/15 193 lb (87.544 kg)  06/21/15 195 lb (88.451  kg)    PHYSICAL EXAM BP 144/88 mmHg  Pulse 78  Ht 5' 5"  (1.651 m)  Wt 195 lb 6.4 oz (88.633 kg)  BMI 32.52 kg/m2 General: Very pleasant healthy-appearing AAF, NAD, A&Ox4; answers questions appropriately. Normal mood and affect. HEENT: NCAT, EOMI, MMM, anicteric sclerae.  Neck: Supple; no LAN,JVD, or carotid bruit.  Heart: RRR, normal S1, S2. No M/R/G. Nondisplaced PMI.  Lungs: CTAB, nonlabored,normal effort, good air movement.  Abdomen: Soft/NT/ND/NABS. No HSM. Mild obesity.  Extremities: No C/C/E, 2+ equal pulses throughout.  Neuro: CN II-XII Grossly intact. Non-focal    Adult ECG Report - not checked  Other studies Reviewed: Additional studies/ records that were reviewed today include:  Recent Labs:   Lab Results  Component Value Date   CHOL 177 11/22/2014   HDL 76 11/22/2014   LDLCALC 79 11/22/2014   TRIG 109 11/22/2014   CHOLHDL 2.3 11/22/2014    ASSESSMENT / PLAN: Problem List Items Addressed This Visit    Palpitations (Chronic)    Notably improved with increased dose of beta blocker      Obesity (BMI 30.0-34.9) (Chronic)    Stable weight. Now that she has a good report from a cardiac catheterization, she is anymore happy about the neck in exercising and watching her diet.      Hyperlipidemia (Chronic)    On moderate dose statin and myalgia symptoms are most notably relieved with CoQ10      Relevant Medications   hydrochlorothiazide (HYDRODIURIL) 25 MG tablet   Essential hypertension (Chronic)    Interestingly, her pressures have been well controlled, but are now higher. I recently increased her metoprolol dose so that both Toprol and losartan max dose. She still has some room to increase amlodipine and HCTZ. Plan will be to increase HCTZ 25 mg daily for some mild volume removal as well. Her EDP was somewhat elevated in the Cath Lab.      Relevant Medications   hydrochlorothiazide (HYDRODIURIL) 25 MG tablet   Chest pain on exertion    Proven to be  not macrovascular by cardiac catheterization. 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 He was seen that her exertional chest discomfort and dyspnea is not related to microvascular disease. She may very well have microvascular disease, but no large vessel disease. Continues to be on amlodipine and statin as well as aspirin. Is also on beta blocker and ACE inhibitor. Her symptoms improved after increasing her beta blocker dose.      Relevant Medications   hydrochlorothiazide (HYDRODIURIL) 25 MG tablet   Abnormal stress electrocardiogram test using treadmill    It would appear that the imaging portion of her stress test was more accurate in the treadmill. In the future, I would simply do a [employer].         Current medicines are reviewed at length with the patient today. (+/- concerns) n/a The following changes have been made:   INCREASE HCTZ 25 MG ( FLUID PILL/BLOOD PRESSURE) ONE TABLET DAILY--- CALL PHARMACY WHEN YOU NEED ANEW BOTTLE.  NO OTHER CHANGES TODAY  Your physician wants you to follow-up in 6 months with Dr Ellyn Hack.  Studies Ordered:   No orders of the defined types were placed in this encounter.      Leonie Man, M.D., M.S. Interventional Cardiologist   Pager # [phone] Phone # [phone] [address]. [place],  [address]

## 2015-07-25 NOTE — Patient Instructions (Addendum)
INCREASE HCTZ 25 MG ( FLUID PILL/BLOOD PRESSURE) ONE TABLET DAILY--- CALL PHARMACY WHEN YOU NEED ANEW BOTTLE.  NO OTHER CHANGES TODAY  Your physician wants you to follow-up in 6 months with Dr Ellyn Hack.  You will receive a reminder letter in the mail two months in advance. If you don't receive a letter, please call our office to schedule the follow-up appointment.   If you need a refill on your cardiac medications before your next appointment, please call your pharmacy.

## 2015-07-28 ENCOUNTER — Encounter: Payer: Self-pay | Admitting: Cardiology

## 2015-07-28 NOTE — Assessment & Plan Note (Signed)
On moderate dose statin and myalgia symptoms are most notably relieved with CoQ10

## 2015-07-28 NOTE — Assessment & Plan Note (Signed)
It would appear that the imaging portion of her stress test was more accurate in the treadmill. In the future, I would simply do a [employer].

## 2015-07-28 NOTE — Assessment & Plan Note (Signed)
Interestingly, her pressures have been well controlled, but are now higher. I recently increased her metoprolol dose so that both Toprol and losartan max dose. She still has some room to increase amlodipine and HCTZ. Plan will be to increase HCTZ 25 mg daily for some mild volume removal as well. Her EDP was somewhat elevated in the Cath Lab.

## 2015-07-28 NOTE — Assessment & Plan Note (Signed)
Notably improved with increased dose of beta blocker

## 2015-07-28 NOTE — Assessment & Plan Note (Signed)
Stable weight. Now that she has a good report from a cardiac catheterization, she is anymore happy about the neck in exercising and watching her diet.

## 2015-07-28 NOTE — Assessment & Plan Note (Signed)
Very happy see the results of her heart catheterization, showing no significant CAD. He was seen that her exertional chest discomfort and dyspnea is not related to microvascular disease. She may very well have microvascular disease, but no large vessel disease. Continues to be on amlodipine and statin as well as aspirin. Is also on beta blocker and ACE inhibitor. Her symptoms improved after increasing her beta blocker dose.

## 2015-07-28 NOTE — Assessment & Plan Note (Signed)
Proven to be not macrovascular by cardiac catheterization. Continue treatment for microvascular angina

## 2015-09-16 DIAGNOSIS — Z1231 Encounter for screening mammogram for malignant neoplasm of breast: Secondary | ICD-10-CM | POA: Diagnosis not present

## 2015-09-16 DIAGNOSIS — Z803 Family history of malignant neoplasm of breast: Secondary | ICD-10-CM | POA: Diagnosis not present

## 2015-11-07 ENCOUNTER — Ambulatory Visit: Payer: Medicare Other | Admitting: Urgent Care

## 2015-11-21 ENCOUNTER — Other Ambulatory Visit: Payer: Self-pay | Admitting: Urgent Care

## 2015-11-29 DIAGNOSIS — H401133 Primary open-angle glaucoma, bilateral, severe stage: Secondary | ICD-10-CM | POA: Diagnosis not present

## 2015-12-16 DIAGNOSIS — H401133 Primary open-angle glaucoma, bilateral, severe stage: Secondary | ICD-10-CM | POA: Diagnosis not present

## 2015-12-31 ENCOUNTER — Other Ambulatory Visit: Payer: Self-pay | Admitting: Cardiology

## 2016-02-05 ENCOUNTER — Encounter: Payer: Self-pay | Admitting: Urgent Care

## 2016-02-05 ENCOUNTER — Encounter: Payer: Self-pay | Admitting: Physician Assistant

## 2016-02-05 DIAGNOSIS — M8589 Other specified disorders of bone density and structure, multiple sites: Secondary | ICD-10-CM | POA: Diagnosis not present

## 2016-02-11 ENCOUNTER — Ambulatory Visit (INDEPENDENT_AMBULATORY_CARE_PROVIDER_SITE_OTHER): Payer: Medicare Other | Admitting: Cardiology

## 2016-02-11 ENCOUNTER — Encounter: Payer: Self-pay | Admitting: Physician Assistant

## 2016-02-11 ENCOUNTER — Encounter: Payer: Self-pay | Admitting: Cardiology

## 2016-02-11 VITALS — BP 116/80 | HR 70 | Ht 65.0 in | Wt 196.4 lb

## 2016-02-11 DIAGNOSIS — R9439 Abnormal result of other cardiovascular function study: Secondary | ICD-10-CM

## 2016-02-11 DIAGNOSIS — I10 Essential (primary) hypertension: Secondary | ICD-10-CM

## 2016-02-11 DIAGNOSIS — I119 Hypertensive heart disease without heart failure: Secondary | ICD-10-CM | POA: Diagnosis not present

## 2016-02-11 DIAGNOSIS — R079 Chest pain, unspecified: Secondary | ICD-10-CM

## 2016-02-11 DIAGNOSIS — E785 Hyperlipidemia, unspecified: Secondary | ICD-10-CM

## 2016-02-11 DIAGNOSIS — R002 Palpitations: Secondary | ICD-10-CM | POA: Diagnosis not present

## 2016-02-11 NOTE — Patient Instructions (Signed)
Your physician wants you to follow-up in: 1 year with Dr. Ellyn Hack. You will receive a reminder letter in the mail two months in advance. If you don't receive a letter, please call our office to schedule the follow-up appointment. If you need a refill on your cardiac medications before your next appointment, please call your pharmacy.

## 2016-02-11 NOTE — Progress Notes (Signed)
PCP: Mani,Mario, PA-C  Clinic Note: Chief Complaint  Patient presents with  . Follow-up    no chest pain, no other complaints    PROBLEM LIST: 1. Essential hypertension   2. Hypertensive heart disease without CHF   3. Exertional chest pain   4. Palpitations   5. Hyperlipidemia   6. Abnormal stress electrocardiogram test using treadmill: With Normal Imaging -> imaging confirms lack of significant CAD     HPI: Angela Chambers is a 75 y.o. female with a PMH below who presents today for close f/u after Myoview ordered for SSx concerning for new onset exertional Chest pain concerning for class III angina evaluated with cath showing no significant CAD.   Angela Chambers was last seen in late Feb 2017 post-cath.  She had significant exertional dyspnea and a grossly abnormal GXT portion of the Treadmill Myoview. Based on cath results, it would appear that the imaging was actually more accurate than her GXT portion.  Recent Hospitalizations: None  Studies Reviewed: no new    Interval History:  She now presents for follow-up after these results. Thankfully since her cath was performed, she seems to be doing relatively well. She is now back exercising. She is not noticing nearly as much of the way of limiting dyspnea or chest discomfort. She is is now back in the gym, and working out pretty much back to her previous baseline. She is doing well without  symptoms.. She says that she is doing both a treadmill and other machines.  No resting or exertional chest tightness/pressure or dyspnea. Otherwise from a cardiac standpoint, she denies any PND, orthopnea or edema.  No palpitations, lightheadedness, dizziness, weakness or syncope/near syncope. No TIA/amaurosis fugax symptoms. No claudication.  ROS: A comprehensive was performed. Review of Systems  Constitutional: Negative for malaise/fatigue.  HENT: Negative for nosebleeds.   Respiratory: Positive for shortness of breath  (notably improved). Negative for cough and wheezing.   Cardiovascular: Negative.  Negative for claudication.  Gastrointestinal: Negative for blood in stool and melena.  Genitourinary: Negative for hematuria.  Musculoskeletal: Positive for joint pain (some discomfort with knee replacement.).  Neurological: Negative for dizziness, weakness and headaches.  Endo/Heme/Allergies: Does not bruise/bleed easily.  Psychiatric/Behavioral: The patient is not nervous/anxious (Very much relieved of the results of her Cardiac cath).   All other systems reviewed and are negative.   Past Medical History:  Diagnosis Date  . Arthritis   . Cataract   . Diabetes mellitus   . Fibroids   . Glaucoma   . Hyperlipidemia   . Hypertension 07 /2013    WITH EF GREATER THAN 55 % WITH MILD MR BUT NO PROLASPE ,ESSENTIALLY NORMAL DONE TO EVALUATE PALPITATIONS .  Marland Kitchen Obesity (BMI 30.0-34.9)   . Osteoporosis   . Palpitations    CONTROLLED ON BETA BLOCKER    Past Surgical History:  Procedure Laterality Date  . ABDOMINAL HYSTERECTOMY  10/18/ 1982   TAH-BSO for fibroids and adenomatous hyperplasia  . CARDIAC CATHETERIZATION  08/2009   NONOBSTRUCTIVE CAD WITH 20% IN THE LAD AND 20 % IN THE RCA NORMAL EF 65%  . CARDIAC CATHETERIZATION N/A 07/05/2015   Procedure: Left Heart Cath and Coronary Angiography;  Surgeon: Leonie Man, MD;  Location: [facility];  Service: Cardiovascular: Angiographically minimal CAD. EF 60-65%  . CPET-MET Test (Cardiopulmonary Exercise Test)  03/06/2013   Adequate effort at 1.11. Peak VO2 12.3 is 86% in the normal range. Heart rate was just outside the  normal range target being 127 beats a minute but this was 86% of projected. She did have an ischemic pattern after the anterograde threshold with increased heart rate and blunting of stroke volume during the last 3 minutes of exercise, but was asymptomatic.  Marland Kitchen JOINT REPLACEMENT  09/05/2009   L knee  . KNEE SURGERY  1984   per pt screws in  R knee  . NM MYOVIEW LTD  06/2015   FALSE + GXT - Images Accurate:  TM - 6 min, 7 METs (93% MPHR) --> chest discomfort, notable ST depressions => Hight Risk Duke TM Score, but NO scintigraphic evidence of ischemia or infarction. EF 74$  . 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 TRANSTHORACIC ECHOCARDIOGRAM  July 2013   EF greater than 55%, mild MR no prolapse.  Normal  . TUBAL LIGATION      Prior to Admission medications   Medication Sig Start Date Taking? Authorizing Provider  amLODipine (NORVASC) 5 MG tablet Take 1 tablet (5 mg total) by mouth daily. 04/08/15 Yes [name], PA-C  aspirin 81 MG tablet Take 81 mg by mouth daily.  Yes Historical Provider, MD  atorvastatin (LIPITOR) 20 MG tablet take 1 tablet by mouth once daily 11/21/15 Yes Chelle Jeffery, PA-C  Co-Enzyme Q-10 30 MG CAPS Take 30 mg by mouth daily.  Yes Historical Provider, MD  hydrochlorothiazide (HYDRODIURIL) 25 MG tablet Take 1 tablet (25 mg total) by mouth daily. 07/25/15 Yes Leonie Man, MD  losartan (COZAAR) 100 MG tablet Take 1 tablet (100 mg total) by mouth daily. 04/04/15 Yes [name], PA-C  metFORMIN (GLUCOPHAGE) 500 MG tablet Take 1 tablet (500 mg total) by mouth 2 (two) times daily with a meal. 04/04/15 Yes [name], PA-C  metoprolol succinate (TOPROL-XL) 50 MG 24 hr tablet take 1 tablet by mouth once daily 12/31/15 Yes Leonie Man, MD  Multiple Vitamin (MULTIVITAMIN) tablet Take 1 tablet by mouth daily.  Yes Historical Provider, MD  OVER THE COUNTER MEDICATION Co Q 10 taking once daily  Yes Historical Provider, MD  PRESCRIPTION MEDICATION Timolol 0.05 % taking 1 drop to each eye every am  Yes Historical Provider, MD  timolol (TIMOPTIC) 0.5 % ophthalmic solution Place into both eyes every morning. 04/30/14 Yes Historical Provider, MD  Travoprost, BAK Free, (TRAVATAN) 0.004  % SOLN ophthalmic solution Place 1 drop into both eyes at bedtime.  Yes Historical Provider, MD     Allergies  Allergen Reactions  . Lisinopril Cough    Social History   Social History  . Marital status: Married    Spouse name: N/A  . Number of children: N/A  . Years of education: N/A   Occupational History  . retired    Social History Main Topics  . Smoking status: Former Smoker    Packs/day: 0.50    Years: 20.00    Types: Cigarettes    Quit date: 07/13/1981  . Smokeless tobacco: Never Used  . Alcohol use No  . Drug use: No  . Sexual activity: Yes   Other Topics Concern  . None   Social History Narrative   SHE IS MARRIED ,MOTHER OF 4, GRANDMOTHER OF 6. SHE DOES EXERCISE BUT SOMEWHAT LIMITED BECAUSE OF HER KNEE (S/P SURGERY 2015).   SHE QUIT SMOKING IN 1980 AND SHE DOES NOT DRINK   Family History  Problem Relation  Age of Onset  . Diabetes Mother   . Heart disease Mother   . Hyperlipidemia Mother   . Diabetes Sister   . Hyperlipidemia Sister   . Hypertension Sister   . Diabetes Brother   . Hyperlipidemia Brother   . Hyperlipidemia Sister     Wt Readings from Last 3 Encounters:  02/11/16 196 lb 6 oz (89.1 kg)  07/25/15 195 lb 6.4 oz (88.6 kg)  07/05/15 193 lb (87.5 kg)    PHYSICAL EXAM BP 116/80 (BP Location: Left Arm, Patient Position: Sitting, Cuff Size: Large)   Pulse 70   Ht '5\' 5"'$  (1.651 m)   Wt 196 lb 6 oz (89.1 kg)   BMI 32.68 kg/m  General: Very pleasant healthy-appearing AAF, NAD, A&Ox4; answers questions appropriately. Normal mood and affect. HEENT: NCAT, EOMI, MMM, anicteric sclerae.  Neck: Supple; no LAN,JVD, or carotid bruit.  Heart: RRR, normal S1, S2. No M/R/G. Nondisplaced PMI.  Lungs: CTAB, nonlabored,normal effort, good air movement.  Abdomen: Soft/NT/ND/NABS. No HSM. Mild obesity.  Extremities: No C/C/E, 2+ equal pulses throughout.  Neuro: CN II-XII Grossly intact. Non-focal    Adult ECG Report -  Sinus rhythm, rate 71  BPM. Nonspecific ST and T-wave changes with subtle ST depressions in III and aVF. No significant change.  Other studies Reviewed: Additional studies/ records that were reviewed today include:  Recent Labs:  PCP checks   ASSESSMENT / PLAN: Problem List Items Addressed This Visit    Palpitations (Chronic)    Well-controlled on beta blocker. Not bothering her anymore now.      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 Well-controlled blood pressure today. She is on multiple medications including amlodipine, losartan, HCTZ and Toprol. We did increase her Toprol dose recently as well as her HCTZ dose.      Hyperlipidemia (Chronic)    On statin.  No significant myalgias now that she is on CoQ10.      Exertional chest pain    Symptoms were very concerning for exertional angina. Thankfully her cardiac catheterization corroborates the imaging portion of her stress test. In the future, I would probably not do the treadmill portion of the stress test, and would defer to [place].  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 It seems like her symptoms did improve after we increased her beta blocker dose.  Plan:   Continue current dose of amlodipine and beta blocker along with ARB for afterload reduction  Continue risk factor modification with statin.  Continue aspirin      Abnormal stress electrocardiogram test using treadmill: With Normal Imaging -> imaging confirms lack of significant CAD (Chronic)    For future stress test, would opt for [facility] as opposed to treadmill.       Other Visit Diagnoses   None.     Current medicines are reviewed at length with the patient  today. (+/- concerns) n/a The following changes have been made:   NO OTHER CHANGES TODAY  Follow-up: 1 yr  with Dr Ellyn Hack.  Studies Ordered:   Orders Placed This Encounter  Procedures  . EKG 12-Lead      Glenetta Hew, M.D., M.S. Interventional Cardiologist   Pager # [phone] Phone # [phone] [address] [place], [place] [address]

## 2016-02-13 ENCOUNTER — Encounter: Payer: Self-pay | Admitting: Cardiology

## 2016-02-13 NOTE — Assessment & Plan Note (Signed)
On statin.  No significant myalgias now that she is on CoQ10.

## 2016-02-13 NOTE — Assessment & Plan Note (Signed)
Symptoms were very concerning for exertional angina. Thankfully her cardiac catheterization corroborates the imaging portion of her stress test. In the future, I would probably not do the treadmill portion of the stress test, and would defer to [place].  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 It seems like her symptoms did improve after we increased her beta blocker dose.  Plan:   Continue current dose of amlodipine and beta blocker along with ARB for afterload reduction  Continue risk factor modification with statin.  Continue aspirin

## 2016-02-13 NOTE — Assessment & Plan Note (Signed)
Although she did not have true heart failure, she did have exertional dyspnea and chest pain that could potentially be blamed on elevated LVEDP. Well-controlled blood pressure today. She is on multiple medications including amlodipine, losartan, HCTZ and Toprol. We did increase her Toprol dose recently as well as her HCTZ dose.

## 2016-02-13 NOTE — Assessment & Plan Note (Signed)
Well-controlled on beta blocker. Not bothering her anymore now.

## 2016-02-13 NOTE — Assessment & Plan Note (Signed)
For future stress test, would opt for [facility] as opposed to treadmill.

## 2016-03-02 DIAGNOSIS — H401133 Primary open-angle glaucoma, bilateral, severe stage: Secondary | ICD-10-CM | POA: Diagnosis not present

## 2016-03-04 ENCOUNTER — Other Ambulatory Visit: Payer: Self-pay | Admitting: Physician Assistant

## 2016-03-10 ENCOUNTER — Encounter: Payer: Medicare Other | Admitting: Physician Assistant

## 2016-03-17 ENCOUNTER — Encounter: Payer: Self-pay | Admitting: Physician Assistant

## 2016-03-17 ENCOUNTER — Ambulatory Visit (INDEPENDENT_AMBULATORY_CARE_PROVIDER_SITE_OTHER): Payer: Medicare Other | Admitting: Physician Assistant

## 2016-03-17 VITALS — BP 124/70 | HR 85 | Temp 97.9°F | Resp 16 | Wt 199.0 lb

## 2016-03-17 DIAGNOSIS — I119 Hypertensive heart disease without heart failure: Secondary | ICD-10-CM

## 2016-03-17 DIAGNOSIS — Z0000 Encounter for general adult medical examination without abnormal findings: Secondary | ICD-10-CM

## 2016-03-17 DIAGNOSIS — E119 Type 2 diabetes mellitus without complications: Secondary | ICD-10-CM | POA: Diagnosis not present

## 2016-03-17 DIAGNOSIS — E785 Hyperlipidemia, unspecified: Secondary | ICD-10-CM

## 2016-03-17 LAB — CBC WITH DIFFERENTIAL/PLATELET
BASOS ABS: 48 {cells}/uL (ref 0–200)
Basophils Relative: 1 %
EOS ABS: 144 {cells}/uL (ref 15–500)
Eosinophils Relative: 3 %
HEMATOCRIT: 31.9 % — AB (ref 35.0–45.0)
Hemoglobin: 11 g/dL — ABNORMAL LOW (ref 11.7–15.5)
LYMPHS PCT: 40 %
Lymphs Abs: 1920 cells/uL (ref 850–3900)
MCH: 28.6 pg (ref 27.0–33.0)
MCHC: 34.5 g/dL (ref 32.0–36.0)
MCV: 83.1 fL (ref 80.0–100.0)
MONO ABS: 432 {cells}/uL (ref 200–950)
MONOS PCT: 9 %
MPV: 9.5 fL (ref 7.5–12.5)
NEUTROS ABS: 2256 {cells}/uL (ref 1500–7800)
Neutrophils Relative %: 47 %
PLATELETS: 309 10*3/uL (ref 140–400)
RBC: 3.84 MIL/uL (ref 3.80–5.10)
RDW: 15 % (ref 11.0–15.0)
WBC: 4.8 10*3/uL (ref 3.8–10.8)

## 2016-03-17 LAB — TSH: TSH: 2.55 mIU/L

## 2016-03-17 NOTE — Progress Notes (Signed)
Presents today for [employer] Visit-Subsequent.  Date of last exam: Last annual exam 11/22/2014  Interpreter used for this visit? No  Patient Care Team: Angela Eagles, PA-C as PCP - General (Urgent Care) Leonie Man, MD as Attending Physician (Cardiology) Gaynelle Arabian, MD as Consulting Physician (Orthopedic Surgery) Rondel Oh, MD as Referring Physician (Ophthalmology)   Other items to address today: No current complaints today, followed by several other specialists and PCP to manage chronic conditions.   Cancer Screening: Cervical: N/A, history of hysterectomy Breast: Last mammogram 08/2015, next recommended 2018 Colon: Last colonoscopy 09/2011, next recommend 2023  Other Screening: Last screening for diabetes: Patient has Type II Diabetes Mellitus  Last lipid screening: Patient has hyperlipidemia.   ADVANCE DIRECTIVES: Discussed: Yes On File: No Materials Provided: No   Immunization status: up to date and documented.  Home Environment: Patient lives at home with husband in two-story house. Able to perform ADLs on own without assistance or problems.        Patient Active Problem List   Diagnosis Date Noted  . Abnormal stress electrocardiogram test using treadmill: With Normal Imaging -> imaging confirms lack of significant CAD 06/21/2015  . Exertional chest pain 06/07/2015  . Glaucoma (increased eye pressure) 11/22/2014  . OA (osteoarthritis) of knee 09/25/2013  . Obesity (BMI 30.0-34.9)   . Palpitations   . Hypertensive heart disease without CHF 11/30/2011  . Hyperlipidemia 08/25/2011  . DIABETES MELLITUS- TYPE II 02/22/2009  . PERSONAL HISTORY OF COLONIC POLYPS 02/22/2009         Past Medical History:  Diagnosis Date  . Arthritis   . Cataract   . Diabetes mellitus   . Fibroids   . Glaucoma   . Hyperlipidemia   . Hypertension 07 /2013    WITH EF GREATER THAN 55 % WITH MILD MR BUT NO PROLASPE  ,ESSENTIALLY NORMAL DONE TO EVALUATE PALPITATIONS .  Marland Kitchen Obesity (BMI 30.0-34.9)   . Osteoporosis   . Palpitations    CONTROLLED ON BETA BLOCKER          Past Surgical History:  Procedure Laterality Date  . ABDOMINAL HYSTERECTOMY  10/18/ 1982   TAH-BSO for fibroids and adenomatous hyperplasia  . CARDIAC CATHETERIZATION  08/2009   NONOBSTRUCTIVE CAD WITH 20% IN THE LAD AND 20 % IN THE RCA NORMAL EF 65%  . CARDIAC CATHETERIZATION N/A 07/05/2015   Procedure: Left Heart Cath and Coronary Angiography;  Surgeon: Leonie Man, MD;  Location: [facility];  Service: Cardiovascular: Angiographically minimal CAD. EF 60-65%  . CPET-MET Test (Cardiopulmonary Exercise Test)  03/06/2013   Adequate effort at 1.11. Peak VO2 12.3 is 86% in the normal range. Heart rate was just outside the normal range target being 127 beats a minute but this was 86% of projected. She did have an ischemic pattern after the anterograde threshold with increased heart rate and blunting of stroke volume during the last 3 minutes of exercise, but was asymptomatic.  Marland Kitchen JOINT REPLACEMENT  09/05/2009   L knee  . KNEE SURGERY  1984   per pt screws in R knee  . NM MYOVIEW LTD  06/2015   FALSE + GXT - Images Accurate:  TM - 6 min, 7 METs (93% MPHR) --> chest discomfort, notable ST depressions => Hight Risk Duke TM Score, but NO scintigraphic evidence of ischemia or infarction. EF 74$  . 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 TRANSTHORACIC ECHOCARDIOGRAM  July 2013   EF greater than 55%, mild MR no prolapse.  Normal  . TUBAL LIGATION            Family History  Problem Relation Age of Onset  . Diabetes Mother   . Heart disease Mother   . Hyperlipidemia Mother   . Diabetes Sister   . Hyperlipidemia Sister   . Hypertension Sister   . Diabetes Brother    . Hyperlipidemia Brother   . Hyperlipidemia Sister      Social History        Social History  . Marital status: Married    Spouse name: N/A  . Number of children: N/A  . Years of education: N/A       Occupational History  . retired         Social History Main Topics  . Smoking status: Former Smoker    Packs/day: 0.50    Years: 20.00    Types: Cigarettes    Quit date: 07/13/1981  . Smokeless tobacco: Never Used  . Alcohol use No  . Drug use: No  . Sexual activity: Yes       Other Topics Concern  . Not on file      Social History Narrative   SHE IS MARRIED ,MOTHER OF 4, GRANDMOTHER OF 6. SHE DOES EXERCISE BUT SOMEWHAT LIMITED BECAUSE OF HER KNEE (S/P SURGERY 2015).   SHE QUIT SMOKING IN 1980 AND SHE DOES NOT DRINK     Allergies  Allergen Reactions  . Lisinopril Cough            Prior to Admission medications   Medication Sig Start Date End Date Taking? Authorizing Provider  amLODipine (NORVASC) 5 MG tablet Take 1 tablet (5 mg total) by mouth daily. 04/08/15  Yes Angela Eagles, PA-C  aspirin 81 MG tablet Take 81 mg by mouth daily.   Yes Historical Provider, MD  atorvastatin (LIPITOR) 20 MG tablet take 1 tablet by mouth once daily 03/04/16  Yes Chelle Jeffery, PA-C  Co-Enzyme Q-10 30 MG CAPS Take 30 mg by mouth daily.   Yes Historical Provider, MD  hydrochlorothiazide (HYDRODIURIL) 25 MG tablet Take 1 tablet (25 mg total) by mouth daily. 07/25/15  Yes Leonie Man, MD  losartan (COZAAR) 100 MG tablet Take 1 tablet (100 mg total) by mouth daily. 04/04/15  Yes Angela Eagles, PA-C  metFORMIN (GLUCOPHAGE) 500 MG tablet Take 1 tablet (500 mg total) by mouth 2 (two) times daily with a meal. 04/04/15  Yes Angela Eagles, PA-C  metoprolol succinate (TOPROL-XL) 50 MG 24 hr tablet take 1 tablet by mouth once daily 12/31/15  Yes Leonie Man, MD  Multiple Vitamin (MULTIVITAMIN) tablet Take 1 tablet by mouth daily.   Yes Historical Provider,  MD  OVER THE COUNTER MEDICATION Co Q 10 taking once daily   Yes Historical Provider, MD  PRESCRIPTION MEDICATION Timolol 0.05 % taking 1 drop to each eye every am   Yes Historical Provider, MD  timolol (TIMOPTIC) 0.5 % ophthalmic solution Place into both eyes every morning. 04/30/14  Yes Historical Provider, MD  Travoprost, BAK Free, (TRAVATAN) 0.004 % SOLN ophthalmic solution Place 1 drop into both eyes at bedtime.   Yes Historical Provider, MD     Depression screen [facility] 2/9 03/17/2016 05/09/2015 04/04/2015 02/28/2015 11/22/2014  Decreased Interest 0 0 0 0 0  Down, Depressed, Hopeless 0 0 0 0 0  PHQ - 2 Score 0 0 0 0 0     Fall Risk  03/17/2016 05/09/2015 04/04/2015 02/28/2015 11/22/2014  Falls in the past year? _0      Functional Status Survey:       Evaluation of Cognitive Function: Mood/affect: Appropriate affect and pleasant mood. Appearance: Appears stated age, well-groomed, well-nourished, well-developed, appears well and in no apparent distress.  Family Member/caregiver input: Not provided today    PHYSICAL EXAM: BP 124/70 (BP Location: Left Arm, Patient Position: Sitting, Cuff Size: Large)   Pulse 85   Temp 97.9 F (36.6 C)   Resp 16   Wt 199 lb (90.3 kg)   SpO2 99%   BMI 33.12 kg/m       Wt Readings from Last 3 Encounters:  03/17/16 199 lb (90.3 kg)  02/11/16 196 lb 6 oz (89.1 kg)  07/25/15 195 lb 6.4 oz (88.6 kg)            Visual Acuity Screening   Right eye Left eye Both eyes  Without correction:     With correction: _1 -1      Physical Exam General: Well-developed, well-nourished, appears stated age and in no apparent distress. HEENT:  Head: Normocephalic, atraumatic Eyes: PERRLA, sclera and conjunctiva clear without injection or icterus. Ears: Bilateral canals clear with no lesions, tympanic membranes pearly gray, intact with visible boney landmarks and cone of light. Nose:  Patent. Mucosa deep pink, glistening with no discharge, masses or lesions. No septum perforations, exudate, or polyps. Throat: Mouth and throat non-erythematous without evidence of tonsillar hypertrophy or exudate. No cobble stoning. Neck: Supple. No thyromegaly or lymphadenopathy. No tracheal deviation or JVD. Pulmonary: Clear to auscultation bilaterally, no wheezes, rhonchi, or rales. No cyanosis or clubbing. Cardiovascular: Regular rate and rhythm with normal S1 and S2 without murmurs, rubs, or gallops. Popliteal, posterior tibialis, and dorsalis pedis pulses 2+ bilaterally. Abdominal: Normoactive bowel sounds. Non-tender to light and deep palpation in all four quadrants, no organomegaly, no distention. No rebound or guarding. Neurological: Awake, alert, oriented. Cranial nerves grossly intact. DTRs 2+ bilaterally at Achilles. Strength 5/5 in upper and lower extremities bilaterally. Musculoskeletal: Gait is appropriate.   Skin: Skin warm and dry. No rashes noted. Psychiatric: Appropriate mood and affect. Fluent speech and normal behavior.   Education/Counseling: YES diet and exercise YES prevention of chronic diseases YES smoking/tobacco cessation YES review "Covered Medicare Preventive Services"    ASSESSMENT/PLAN: 1. Medicare annual wellness visit, subsequent No current complaints. Discussed advanced directives and encouraged patient to have documents sent to our office to be placed in EMR. Plan to contact [facility] to have recent DEXA scan results sent to our office as well.  2. Hypertensive heart disease without CHF Basic labs collected today. Blood pressure currently well-controlled on Norvasc, HCTZ, Losartan, and Metoprolol Succinate. Encouraged to follow-up and continue medications as prescribed by PCP and cardiologist. - CBC with Differential/Platelet - Comprehensive metabolic panel - TSH - POCT urinalysis dipstick - Urine Microscopic  3. Type 2 diabetes mellitus without  complication, without long-term current use of insulin (HCC) Currently controlled on Metformin 500 mg daily. Encouraged to follow-up and continue medications as prescribed by PCP. - CBC with Differential/Platelet - Comprehensive metabolic panel - Microalbumin, urine  4. Hyperlipidemia, unspecified hyperlipidemia type Currently taking Atorvastatin 20 mg daily, will adjust as needed pending lipid panel results. - Lipid panel

## 2016-03-17 NOTE — Progress Notes (Signed)
Presents today for [employer] Visit-Subsequent.   Date of last exam: 10/2014  Interpreter used for this visit? no  Patient Care Team: [name], PA-C as PCP - General (Urgent Care) Leonie Man, MD as Attending Physician (Cardiology) Gaynelle Arabian, MD as Consulting Physician (Orthopedic Surgery) Rondel Oh, MD as Referring Physician (Ophthalmology)   Other items to address today: none   Cancer Screening: Cervical: no longer a candidate Breast: yes, mammogram 08/2015 Colon: yes, 09/2011  Prostate: n/a   Other Screening: Last screening for diabetes: patient has diabetes Last lipid screening: patient has hyperlipidemia   ADVANCE DIRECTIVES: Discussed: yes On File: no Materials Provided: no   Immunization status: up to date and documented.  Home Environment: Lives in a single story home with her husband.    Patient Active Problem List   Diagnosis Date Noted  . Abnormal stress electrocardiogram test using treadmill: With Normal Imaging -> imaging confirms lack of significant CAD 06/21/2015  . Exertional chest pain 06/07/2015  . Glaucoma (increased eye pressure) 11/22/2014  . OA (osteoarthritis) of knee 09/25/2013  . Obesity (BMI 30.0-34.9)   . Palpitations   . Hypertensive heart disease without CHF 11/30/2011  . Hyperlipidemia 08/25/2011  . DIABETES MELLITUS- TYPE II 02/22/2009  . PERSONAL HISTORY OF COLONIC POLYPS 02/22/2009     Past Medical History:  Diagnosis Date  . Arthritis   . Cataract   . Diabetes mellitus   . Fibroids   . Glaucoma   . Hyperlipidemia   . Hypertension 07 /2013    WITH EF GREATER THAN 55 % WITH MILD MR BUT NO PROLASPE ,ESSENTIALLY NORMAL DONE TO EVALUATE PALPITATIONS .  Marland Kitchen Obesity (BMI 30.0-34.9)   . Osteoporosis   . Palpitations    CONTROLLED ON BETA BLOCKER     Past Surgical History:  Procedure Laterality Date  . ABDOMINAL HYSTERECTOMY  10/18/ 1982   TAH-BSO for fibroids and adenomatous hyperplasia   . CARDIAC CATHETERIZATION  08/2009   NONOBSTRUCTIVE CAD WITH 20% IN THE LAD AND 20 % IN THE RCA NORMAL EF 65%  . CARDIAC CATHETERIZATION N/A 07/05/2015   Procedure: Left Heart Cath and Coronary Angiography;  Surgeon: Leonie Man, MD;  Location: [facility];  Service: Cardiovascular: Angiographically minimal CAD. EF 60-65%  . CPET-MET Test (Cardiopulmonary Exercise Test)  03/06/2013   Adequate effort at 1.11. Peak VO2 12.3 is 86% in the normal range. Heart rate was just outside the normal range target being 127 beats a minute but this was 86% of projected. She did have an ischemic pattern after the anterograde threshold with increased heart rate and blunting of stroke volume during the last 3 minutes of exercise, but was asymptomatic.  Marland Kitchen JOINT REPLACEMENT  09/05/2009   L knee  . KNEE SURGERY  1984   per pt screws in R knee  . NM MYOVIEW LTD  06/2015   FALSE + GXT - Images Accurate:  TM - 6 min, 7 METs (93% MPHR) --> chest discomfort, notable ST depressions => Hight Risk Duke TM Score, but NO scintigraphic evidence of ischemia or infarction. EF 74$  . 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 TRANSTHORACIC ECHOCARDIOGRAM  July 2013   EF greater than 55%, mild MR no prolapse.  Normal  . TUBAL LIGATION       Family History  Problem Relation Age of  Onset  . Diabetes Mother   . Heart disease Mother   . Hyperlipidemia Mother   . Diabetes Sister   . Hyperlipidemia Sister   . Hypertension Sister   . Diabetes Brother   . Hyperlipidemia Brother   . Hyperlipidemia Sister      Social History   Social History  . Marital status: Married    Spouse name: N/A  . Number of children: N/A  . Years of education: N/A   Occupational History  . retired    Social History Main Topics  . Smoking status: Former Smoker    Packs/day: 0.50    Years: 20.00     Types: Cigarettes    Quit date: 07/13/1981  . Smokeless tobacco: Never Used  . Alcohol use No  . Drug use: No  . Sexual activity: Yes   Other Topics Concern  . Not on file   Social History Narrative   SHE IS MARRIED ,MOTHER OF 4, GRANDMOTHER OF 6. SHE DOES EXERCISE BUT SOMEWHAT LIMITED BECAUSE OF HER KNEE (S/P SURGERY 2015).   SHE QUIT SMOKING IN 1980 AND SHE DOES NOT DRINK     Allergies  Allergen Reactions  . Lisinopril Cough     Prior to Admission medications   Medication Sig Start Date End Date Taking? Authorizing Provider  amLODipine (NORVASC) 5 MG tablet Take 1 tablet (5 mg total) by mouth daily. 04/08/15  Yes [name], PA-C  aspirin 81 MG tablet Take 81 mg by mouth daily.   Yes Historical Provider, MD  atorvastatin (LIPITOR) 20 MG tablet take 1 tablet by mouth once daily 03/04/16  Yes Kanchan Gal, PA-C  Co-Enzyme Q-10 30 MG CAPS Take 30 mg by mouth daily.   Yes Historical Provider, MD  hydrochlorothiazide (HYDRODIURIL) 25 MG tablet Take 1 tablet (25 mg total) by mouth daily. 07/25/15  Yes Leonie Man, MD  losartan (COZAAR) 100 MG tablet Take 1 tablet (100 mg total) by mouth daily. 04/04/15  Yes [name], PA-C  metFORMIN (GLUCOPHAGE) 500 MG tablet Take 1 tablet (500 mg total) by mouth 2 (two) times daily with a meal. 04/04/15  Yes [name], PA-C  metoprolol succinate (TOPROL-XL) 50 MG 24 hr tablet take 1 tablet by mouth once daily 12/31/15  Yes Leonie Man, MD  Multiple Vitamin (MULTIVITAMIN) tablet Take 1 tablet by mouth daily.   Yes Historical Provider, MD  OVER THE COUNTER MEDICATION Co Q 10 taking once daily   Yes Historical Provider, MD  PRESCRIPTION MEDICATION Timolol 0.05 % taking 1 drop to each eye every am   Yes Historical Provider, MD  timolol (TIMOPTIC) 0.5 % ophthalmic solution Place into both eyes every morning. 04/30/14  Yes Historical Provider, MD  Travoprost, BAK Free, (TRAVATAN) 0.004 % SOLN ophthalmic solution Place 1 drop into both eyes at  bedtime.   Yes Historical Provider, MD     Depression screen [facility] 2/9 03/17/2016 05/09/2015 04/04/2015 02/28/2015 11/22/2014  Decreased Interest 0 0 0 0 0  Down, Depressed, Hopeless 0 0 0 0 0  PHQ - 2 Score 0 0 0 0 0     Fall Risk  03/17/2016 05/09/2015 04/04/2015 02/28/2015 11/22/2014  Falls in the past year? No No No No No     Functional Status Survey:       Evaluation of Cognitive Function: Mood/affect: cheerful/bright Appearance: well-kempt Family Member/caregiver input:     PHYSICAL EXAM: BP 124/70 (BP Location: Left Arm, Patient Position: Sitting, Cuff Size: Large)   Pulse 85  Temp 97.9 F (36.6 C)   Resp 16   Wt 199 lb (90.3 kg)   SpO2 99%   BMI 33.12 kg/m    Wt Readings from Last 3 Encounters:  03/17/16 199 lb (90.3 kg)  02/11/16 196 lb 6 oz (89.1 kg)  07/25/15 195 lb 6.4 oz (88.6 kg)       Visual Acuity Screening   Right eye Left eye Both eyes  Without correction:     With correction: 20/50 20/40 20/30  -1      Physical Exam              Education/Counseling: yes diet and exercise yes prevention of chronic diseases n/a smoking/tobacco cessation yes review "Covered Medicare Preventive Services"    ASSESSMENT/PLAN:  1. Medicare annual wellness visit, subsequent Age appropriate anticipatory guidance provided.  2. Hypertensive heart disease without CHF Stable. - CBC with Differential/Platelet - Comprehensive metabolic panel - TSH - POCT urinalysis dipstick - Urine Microscopic  3. Type 2 diabetes mellitus without complication, without long-term current use of insulin ([place]) Await labs. Adjust regimen as indicated by results. - Comprehensive metabolic panel - Microalbumin, urine  4. Hyperlipidemia, unspecified hyperlipidemia type Await labs. Adjust regimen as indicated by results. - Lipid panel   Return in about 6 months (around 09/15/2016) for for re-evaluation with [facility], PA-C.   Fara Chute, PA-C Physician  Assistant-Certified [facility]

## 2016-03-17 NOTE — Patient Instructions (Addendum)
   IF you received an x-ray today, you will receive an invoice from [facility]. Please contact [facility] at [phone] with questions or concerns regarding your invoice.   IF you received labwork today, you will receive an invoice from [facility]. Please contact [facility] at [phone] with questions or concerns regarding your invoice.   Our billing staff will not be able to assist you with questions regarding bills from these companies.  You will be contacted with the lab results as soon as they are available. The fastest way to get your results is to activate your My Chart account. Instructions are located on the last page of this paperwork. If you have not heard from us regarding the results in 2 weeks, please contact this office.    Keeping You Healthy  Get These Tests  Blood Pressure- Have your blood pressure checked by your healthcare provider at least once a year.  Normal blood pressure is 120/80.  Weight- Have your body mass index (BMI) calculated to screen for obesity.  BMI is a measure of body fat based on height and weight.  You can calculate your own BMI at www.nhlbisupport.com/bmi/  Cholesterol- Have your cholesterol checked every year.  Diabetes- Have your blood sugar checked every year if you have high blood pressure, high cholesterol, a family history of diabetes or if you are overweight.  Pap Test - Have a pap test every 1 to 5 years if you have been sexually active.  If you are older than 65 and recent pap tests have been normal you may not need additional pap tests.  In addition, if you have had a hysterectomy  for benign disease additional pap tests are not necessary.  Mammogram-Yearly mammograms are essential for early detection of breast cancer  Screening for Colon Cancer- Colonoscopy starting at age 50. Screening may begin sooner depending on your family history and other health conditions.  Follow up colonoscopy  as directed by your Gastroenterologist.  Screening for Osteoporosis- Screening begins at age 65 with bone density scanning, sooner if you are at higher risk for developing Osteoporosis.  Get these medicines  Calcium with Vitamin D- Your body requires 1200-1500 mg of Calcium a day and 800-1000 IU of Vitamin D a day.  You can only absorb 500 mg of Calcium at a time therefore Calcium must be taken in 2 or 3 separate doses throughout the day.  Hormones- Hormone therapy has been associated with increased risk for certain cancers and heart disease.  Talk to your healthcare provider about if you need relief from menopausal symptoms.  Aspirin- Ask your healthcare provider about taking Aspirin to prevent Heart Disease and Stroke.  Get these Immuniztions  Flu shot- Every fall  Pneumonia shot- Once after the age of 65; if you are younger ask your healthcare provider if you need a pneumonia shot.  Tetanus- Every ten years.  Zostavax- Once after the age of 60 to prevent shingles.  Take these steps  Don't smoke- Your healthcare provider can help you quit. For tips on how to quit, ask your healthcare provider or go to www.smokefree.gov or call 1-800 QUIT-NOW.  Be physically active- Exercise 5 days a week for a minimum of 30 minutes.  If you are not already physically active, start slow and gradually work up to 30 minutes of moderate physical activity.  Try walking, dancing, bike riding, swimming, etc.  Eat a healthy diet- Eat a variety of healthy foods such as fruits, vegetables, whole   grains, low fat milk, low fat cheeses, yogurt, lean meats, chicken, fish, eggs, dried beans, tofu, etc.  For more information go to www.thenutritionsource.org  Dental visit- Brush and floss teeth twice daily; visit your dentist twice a year.  Eye exam- Visit your Optometrist or Ophthalmologist yearly.  Drink alcohol in moderation- Limit alcohol intake to one drink or less a day.  Never drink and  drive.  Depression- Your emotional health is as important as your physical health.  If you're feeling down or losing interest in things you normally enjoy, please talk to your healthcare provider.  Seat Belts- can save your life; always wear one  Smoke/Carbon Monoxide detectors- These detectors need to be installed on the appropriate level of your home.  Replace batteries at least once a year.  Violence- If anyone is threatening or hurting you, please tell your healthcare provider.  Living Will/ Health care power of attorney- Discuss with your healthcare provider and family. 

## 2016-03-18 ENCOUNTER — Telehealth: Payer: Self-pay | Admitting: *Deleted

## 2016-03-18 LAB — COMPREHENSIVE METABOLIC PANEL
ALK PHOS: 86 U/L (ref 33–130)
ALT: 11 U/L (ref 6–29)
AST: 12 U/L (ref 10–35)
Albumin: 3.8 g/dL (ref 3.6–5.1)
BUN: 18 mg/dL (ref 7–25)
CALCIUM: 9.3 mg/dL (ref 8.6–10.4)
CHLORIDE: 103 mmol/L (ref 98–110)
CO2: 29 mmol/L (ref 20–31)
Creat: 0.79 mg/dL (ref 0.60–0.93)
Glucose, Bld: 179 mg/dL — ABNORMAL HIGH (ref 65–99)
POTASSIUM: 3.8 mmol/L (ref 3.5–5.3)
Sodium: 142 mmol/L (ref 135–146)
TOTAL PROTEIN: 6.3 g/dL (ref 6.1–8.1)
Total Bilirubin: 0.5 mg/dL (ref 0.2–1.2)

## 2016-03-18 LAB — LIPID PANEL
CHOL/HDL RATIO: 2.2 ratio (ref <=5.0)
CHOLESTEROL: 166 mg/dL (ref 125–200)
HDL: 74 mg/dL (ref >=46)
LDL Cholesterol: 75 mg/dL (ref <130)
TRIGLYCERIDES: 85 mg/dL (ref <150)
VLDL: 17 mg/dL (ref <30)

## 2016-03-19 ENCOUNTER — Other Ambulatory Visit: Payer: Self-pay | Admitting: Urgent Care

## 2016-03-19 DIAGNOSIS — E119 Type 2 diabetes mellitus without complications: Secondary | ICD-10-CM

## 2016-03-19 DIAGNOSIS — I10 Essential (primary) hypertension: Secondary | ICD-10-CM

## 2016-03-19 NOTE — Telephone Encounter (Signed)
Yes, please contact patient and ask her to come in for a urine specimen.

## 2016-03-19 NOTE — Telephone Encounter (Signed)
Routed message to [facility]

## 2016-03-26 ENCOUNTER — Telehealth: Payer: Self-pay | Admitting: *Deleted

## 2016-03-26 ENCOUNTER — Encounter: Payer: Self-pay | Admitting: Physician Assistant

## 2016-03-26 NOTE — Telephone Encounter (Signed)
Mychart message sent for patient to come back in

## 2016-03-26 NOTE — Telephone Encounter (Signed)
See my chart message

## 2016-03-27 NOTE — Telephone Encounter (Signed)
Chelle, I don't see that an A1C was done at [facility]. Do you want to offer Lab Only visit if pt wants to return for one?

## 2016-03-30 NOTE — Telephone Encounter (Signed)
Oh, I wonder if I read the one 02/28/2015 and thought it was last month. Yes, please ask her to RTC for that if she doesn't want to wait until her next visit.

## 2016-04-22 ENCOUNTER — Ambulatory Visit: Payer: Medicare Other

## 2016-04-28 ENCOUNTER — Ambulatory Visit (INDEPENDENT_AMBULATORY_CARE_PROVIDER_SITE_OTHER): Payer: Medicare Other | Admitting: Physician Assistant

## 2016-04-28 ENCOUNTER — Encounter: Payer: Self-pay | Admitting: Physician Assistant

## 2016-04-28 VITALS — BP 126/76 | HR 82 | Temp 98.6°F | Resp 16 | Ht 65.0 in | Wt 193.0 lb

## 2016-04-28 DIAGNOSIS — Z23 Encounter for immunization: Secondary | ICD-10-CM | POA: Diagnosis not present

## 2016-04-28 DIAGNOSIS — H409 Unspecified glaucoma: Secondary | ICD-10-CM | POA: Diagnosis not present

## 2016-04-28 DIAGNOSIS — E119 Type 2 diabetes mellitus without complications: Secondary | ICD-10-CM

## 2016-04-28 LAB — POCT GLYCOSYLATED HEMOGLOBIN (HGB A1C): HEMOGLOBIN A1C: 8.6

## 2016-04-28 MED ORDER — TIMOLOL MALEATE 0.5 % OP SOLN: 1.0000 [drp] | Freq: Two times a day (BID) | OPHTHALMIC | 1 refills | Status: DC

## 2016-04-28 MED ORDER — METFORMIN HCL 500 MG PO TABS: 1000.0000 mg | ORAL_TABLET | Freq: Two times a day (BID) | ORAL | 1 refills | Status: DC

## 2016-04-28 NOTE — Patient Instructions (Signed)
     IF you received an x-ray today, you will receive an invoice from [facility]. Please contact [facility] at [phone] with questions or concerns regarding your invoice.   IF you received labwork today, you will receive an invoice from [facility]. Please contact [facility] at [phone] with questions or concerns regarding your invoice.   Our billing staff will not be able to assist you with questions regarding bills from these companies.  You will be contacted with the lab results as soon as they are available. The fastest way to get your results is to activate your My Chart account. Instructions are located on the last page of this paperwork. If you have not heard from us regarding the results in 2 weeks, please contact this office.      

## 2016-04-28 NOTE — Progress Notes (Signed)
Patient ID: Angela Chambers, female    DOB: 1941/01/28, 75 y.o.   MRN: [id number]  PCP: Yvetta Coder  Chief Complaint  Patient presents with  . a1c check  . Immunizations    tetnanus     Subjective:   Presents for A1C and Tdap.  When she was here last month for wellness, I inadvertently did not order the A1C. Glucose at that visit was 179. We planned to reassess at her next visit, but then the home nurse affiliated with her insurance came by to perform their recommended screenings, and the A1C was 10.2%. Urine was negative for glucose and protein. The nurse also recommended the Tdap, which I had not administered due to lack of Medicare coverage for the vaccine, and she would like it, even if she must pay out of pocket.  Since I saw her last, her son died of prostate cancer. He was 55. He had not told her of his diagnosis, not wanting to worry her.  Eye exam 03/02/2016. Next is 07/2016. She needs a refill of timolol drops.    Review of Systems Denies chest pain, shortness of breath, HA, dizziness, vision change, nausea, vomiting, diarrhea, constipation, melena, hematochezia, dysuria, increased urinary urgency or frequency, increased hunger or thirst, unintentional weight change, unexplained myalgias or arthralgias, rash.     Patient Active Problem List   Diagnosis Date Noted  . Abnormal stress electrocardiogram test using treadmill: With Normal Imaging -> imaging confirms lack of significant CAD 06/21/2015  . Exertional chest pain 06/07/2015  . Glaucoma (increased eye pressure) 11/22/2014  . OA (osteoarthritis) of knee 09/25/2013  . Obesity (BMI 30.0-34.9)   . Palpitations   . Hypertensive heart disease without CHF 11/30/2011  . Hyperlipidemia 08/25/2011  . Diabetes mellitus type 2, uncomplicated ([place]) 0000000  . PERSONAL HISTORY OF COLONIC POLYPS 02/22/2009     Prior to Admission medications   Medication Sig Start Date End Date Taking? Authorizing Provider    amLODipine (NORVASC) 5 MG tablet take 1 tablet by mouth once daily 03/24/16  Yes Virgina Deakins, PA-C  aspirin 81 MG tablet Take 81 mg by mouth daily.   Yes Historical Provider, MD  atorvastatin (LIPITOR) 20 MG tablet take 1 tablet by mouth once daily 03/04/16  Yes Kamiryn Bezanson, PA-C  Co-Enzyme Q-10 30 MG CAPS Take 30 mg by mouth daily.   Yes Historical Provider, MD  hydrochlorothiazide (HYDRODIURIL) 25 MG tablet Take 1 tablet (25 mg total) by mouth daily. 07/25/15  Yes Leonie Man, MD  losartan (COZAAR) 100 MG tablet take 1 tablet by mouth once daily 03/24/16  Yes Jaia Alonge, PA-C  metFORMIN (GLUCOPHAGE) 500 MG tablet take 1 tablet by mouth twice a day with food 03/24/16  Yes Emme Rosenau, PA-C  metoprolol succinate (TOPROL-XL) 50 MG 24 hr tablet take 1 tablet by mouth once daily 12/31/15  Yes Leonie Man, MD  Multiple Vitamin (MULTIVITAMIN) tablet Take 1 tablet by mouth daily.   Yes Historical Provider, MD  OVER THE COUNTER MEDICATION Co Q 10 taking once daily   Yes Historical Provider, MD  PRESCRIPTION MEDICATION Timolol 0.05 % taking 1 drop to each eye every am   Yes Historical Provider, MD  timolol (TIMOPTIC) 0.5 % ophthalmic solution Place into both eyes 2 (two) times daily at 10 AM and 5 PM.  04/30/14  Yes Historical Provider, MD  Travoprost, BAK Free, (TRAVATAN) 0.004 % SOLN ophthalmic solution Place 1 drop into both eyes at bedtime.   Yes Historical  Provider, MD     Allergies  Allergen Reactions  . Lisinopril Cough       Objective:  Physical Exam  Constitutional: She is oriented to person, place, and time. She appears well-developed and well-nourished. She is active and cooperative. No distress.  BP 126/76 (BP Location: Right Arm, Patient Position: Sitting, Cuff Size: Large)   Pulse 82   Temp 98.6 F (37 C) (Oral)   Resp 16   Ht 5\' 5"  (1.651 m)   Wt 193 lb (87.5 kg)   SpO2 99%   BMI 32.12 kg/m   HENT:  Head: Normocephalic and atraumatic.  Right Ear: Hearing  normal.  Left Ear: Hearing normal.  Eyes: Conjunctivae are normal. No scleral icterus.  Neck: Normal range of motion. Neck supple. No thyromegaly present.  Cardiovascular: Normal rate, regular rhythm and normal heart sounds.   Pulses:      Radial pulses are 2+ on the right side, and 2+ on the left side.  Pulmonary/Chest: Effort normal and breath sounds normal.  Lymphadenopathy:       Head (right side): No tonsillar, no preauricular, no posterior auricular and no occipital adenopathy present.       Head (left side): No tonsillar, no preauricular, no posterior auricular and no occipital adenopathy present.    She has no cervical adenopathy.       Right: No supraclavicular adenopathy present.       Left: No supraclavicular adenopathy present.  Neurological: She is alert and oriented to person, place, and time. No sensory deficit.  Skin: Skin is warm, dry and intact. No rash noted. No cyanosis or erythema. Nails show no clubbing.  Psychiatric: She has a normal mood and affect. Her speech is normal and behavior is normal.    Results for orders placed or performed in visit on 04/28/16  POCT glycosylated hemoglobin (Hb A1C)  Result Value Ref Range   Hemoglobin A1C 8.6           Assessment & Plan:   1. Type 2 diabetes mellitus without complication, without long-term current use of insulin (HCC) Not controlled. Increase metformin to 1000 mg BID. Healthy eating. Regular exercise. Re-evaluate in 3 months. - POCT glycosylated hemoglobin (Hb A1C) - metFORMIN (GLUCOPHAGE) 500 MG tablet; Take 2 tablets (1,000 mg total) by mouth 2 (two) times daily with a meal.  Dispense: 360 tablet; Refill: 1  2. Glaucoma of both eyes, unspecified glaucoma type - timolol (TIMOPTIC) 0.5 % ophthalmic solution; Place 1 drop into both eyes 2 (two) times daily at 10 AM and 5 PM.  Dispense: 10 mL; Refill: 1  3. Need for Tdap vaccination - Tdap vaccine greater than or equal to 7yo IM   Fara Chute,  PA-C Physician Assistant-Certified [facility]

## 2016-04-28 NOTE — Progress Notes (Signed)
Subjective:    Patient ID: Angela Chambers, female    DOB: 27-Jan-1941, 75 y.o.   MRN: [id number]  Chief Complaint  Patient presents with  . a1c check  . Immunizations    tetnanus    HPI: Presents for HgA1C check and Tdap administration (out of date on 04/19/16). She has a history of 123456 without complications, hypertension, and hyperlipidemia. Patient was seen here on 03/17/2016 for her annual wellness visit but HgbA1C was mistakenly not obtained. Fasting blood glucose at this visit was measured as 179 mg/dL. Patient denies polyuria, polydipsia, hypoglycemic episodes, shaking, dizziness, diaphoresis. Denies chest pain, SOB, visual changes. Endorses occasional palpations which she states are "nothing extreme" and not "out of the ordinary for her". Denies urinary complaints. Exercises 3-4x/week with [employer] program. Currently taking 500 mg Metformin BID. Measures her blood glucose at home which she states is usually around 137 mg/dL fasting and up to 165 non-fating.  She had a home health nurse visit on 04/15/16 in which her HgbA1C was measured as 10.2%. Blood glucose measured 137 mg/dL. Her urine at this visit was negative for glucose and protein. Peripheral artery disease testing (suspecting ABI) was measured 0.65 in both left and right foot. Blood pressure measured 140/70 mmHg. MMSE was normal per report.  Endorses occasionally right ear pain and tenderness to palpation of tragus, particularly when sitting in passenger seat of car with window down. States this has been on and off for a few years but has been noticing it more the past few weeks. Denies discharge from ear.  Requesting refill of her Timolol eye drops. History of glaucoma in bilateral eyes. Patient sees ophthalmologist every 3 months and states last eye pressure measurements around 13 in each eye.  Review of Systems  Constitutional: Negative for appetite change, chills, diaphoresis, fatigue, fever and unexpected weight  change.  HENT: Positive for ear pain and sneezing. Negative for congestion, ear discharge, postnasal drip, rhinorrhea, sinus pain, sinus pressure, sore throat and tinnitus.   Eyes: Negative for pain, discharge, redness, itching and visual disturbance.  Respiratory: Negative for cough, chest tightness, shortness of breath and wheezing.   Cardiovascular: Positive for palpitations. Negative for chest pain.  Gastrointestinal: Negative for abdominal pain, blood in stool, constipation, diarrhea, nausea and vomiting.  Endocrine: Negative for polydipsia, polyphagia and polyuria.  Genitourinary: Negative for difficulty urinating, dysuria, frequency, hematuria and urgency.  Neurological: Negative for dizziness, syncope, light-headedness and headaches.   Allergies  Allergen Reactions  . Lisinopril Cough   Prior to Admission medications   Medication Sig Start Date End Date Taking? Authorizing Provider  amLODipine (NORVASC) 5 MG tablet take 1 tablet by mouth once daily 03/24/16  Yes Chelle Jeffery, PA-C  aspirin 81 MG tablet Take 81 mg by mouth daily.   Yes Historical Provider, MD  atorvastatin (LIPITOR) 20 MG tablet take 1 tablet by mouth once daily 03/04/16  Yes Chelle Jeffery, PA-C  Co-Enzyme Q-10 30 MG CAPS Take 30 mg by mouth daily.   Yes Historical Provider, MD  hydrochlorothiazide (HYDRODIURIL) 25 MG tablet Take 1 tablet (25 mg total) by mouth daily. 07/25/15  Yes Leonie Man, MD  losartan (COZAAR) 100 MG tablet take 1 tablet by mouth once daily 03/24/16  Yes Chelle Jeffery, PA-C  metFORMIN (GLUCOPHAGE) 500 MG tablet Take 2 tablets (1,000 mg total) by mouth 2 (two) times daily with a meal. 04/28/16  Yes Chelle Jeffery, PA-C  metoprolol succinate (TOPROL-XL) 50 MG 24 hr tablet take 1 tablet by  mouth once daily 12/31/15  Yes Leonie Man, MD  Multiple Vitamin (MULTIVITAMIN) tablet Take 1 tablet by mouth daily.   Yes Historical Provider, MD  OVER THE COUNTER MEDICATION Co Q 10 taking once daily    Yes Historical Provider, MD  PRESCRIPTION MEDICATION Timolol 0.05 % taking 1 drop to each eye every am   Yes Historical Provider, MD  timolol (TIMOPTIC) 0.5 % ophthalmic solution Place 1 drop into both eyes 2 (two) times daily at 10 AM and 5 PM. 04/28/16  Yes Chelle Jeffery, PA-C  Travoprost, BAK Free, (TRAVATAN) 0.004 % SOLN ophthalmic solution Place 1 drop into both eyes at bedtime.   Yes Historical Provider, MD   Patient Active Problem List   Diagnosis Date Noted  . Abnormal stress electrocardiogram test using treadmill: With Normal Imaging -> imaging confirms lack of significant CAD 06/21/2015  . Exertional chest pain 06/07/2015  . Glaucoma (increased eye pressure) 11/22/2014  . OA (osteoarthritis) of knee 09/25/2013  . Obesity (BMI 30.0-34.9)   . Palpitations   . Hypertensive heart disease without CHF 11/30/2011  . Hyperlipidemia 08/25/2011  . Diabetes mellitus type 2, uncomplicated ([place]) 0000000  . PERSONAL HISTORY OF COLONIC POLYPS 02/22/2009       Objective: Blood pressure 126/76, pulse 82, temperature 98.6 F (37 C), temperature source Oral, resp. rate 16, height 5\' 5"  (1.651 m), weight 193 lb (87.5 kg), SpO2 99 %.   Physical Exam  Constitutional: She is oriented to person, place, and time. She appears well-developed and well-nourished. No distress.  HENT:  Head: Normocephalic and atraumatic.  Right Ear: External ear normal. No lacerations. No drainage, swelling or tenderness. No foreign bodies. No mastoid tenderness. Tympanic membrane is not injected, not scarred, not perforated, not erythematous, not retracted and not bulging. No middle ear effusion. No hemotympanum.  Left Ear: External ear normal. No lacerations. No drainage, swelling or tenderness. No foreign bodies. No mastoid tenderness. Tympanic membrane is not injected, not scarred, not perforated, not erythematous, not retracted and not bulging.  No middle ear effusion. No hemotympanum.  Mouth/Throat: Uvula is  midline, oropharynx is clear and moist and mucous membranes are normal. Mucous membranes are not pale and not dry. No oral lesions. Normal dentition. No oropharyngeal exudate, posterior oropharyngeal edema or posterior oropharyngeal erythema.  Eyes: Pupils are equal, round, and reactive to light. Right eye exhibits no discharge and no exudate. Left eye exhibits no discharge and no exudate. Right conjunctiva is not injected. Left conjunctiva is not injected. Right pupil is round and reactive. Left pupil is round and reactive. Pupils are equal.  Mild arcus senilis bilaterally.  Neck: Normal range of motion. Neck supple. No tracheal deviation present. No thyromegaly present.  Cardiovascular: Normal rate, regular rhythm, S1 normal, S2 normal, normal heart sounds and intact distal pulses.  Exam reveals no gallop and no friction rub.   No murmur heard. Pulses:      Radial pulses are 2+ on the right side, and 2+ on the left side.  Pulmonary/Chest: Effort normal and breath sounds normal. No stridor. No respiratory distress. She has no wheezes. She has no rales.  Lymphadenopathy:       Head (right side): No submental, no submandibular, no tonsillar, no preauricular and no posterior auricular adenopathy present.       Head (left side): No submental, no submandibular, no tonsillar, no preauricular and no posterior auricular adenopathy present.    She has no cervical adenopathy.  Neurological: She is alert and oriented to  person, place, and time.  Skin: Skin is warm and dry. No rash noted. She is not diaphoretic. No erythema.  Psychiatric: She has a normal mood and affect. Her behavior is normal.    Results for orders placed or performed in visit on 04/28/16  POCT glycosylated hemoglobin (Hb A1C)  Result Value Ref Range   Hemoglobin A1C 8.6       Assessment & Plan:  1. Type 2 diabetes mellitus without complication, without long-term current use of insulin (HCC) HgA1C measurement of 8.6% in office  today. Increased dose of Metformin to 1000 mg BID and advised patient to RTC in 3 months for follow-up. - POCT glycosylated hemoglobin (Hb A1C) - metFORMIN (GLUCOPHAGE) 500 MG tablet; Take 2 tablets (1,000 mg total) by mouth 2 (two) times daily with a meal.  Dispense: 360 tablet; Refill: 1  2. Glaucoma of both eyes, unspecified glaucoma type Refill of Timolol drops provided. Advised to follow-up with ophthalmologist. - timolol (TIMOPTIC) 0.5 % ophthalmic solution; Place 1 drop into both eyes 2 (two) times daily at 10 AM and 5 PM.  Dispense: 10 mL; Refill: 1  3. Need for Tdap vaccination - Tdap vaccine greater than or equal to 7yo IM

## 2016-05-13 ENCOUNTER — Encounter: Payer: Self-pay | Admitting: Physician Assistant

## 2016-05-27 ENCOUNTER — Other Ambulatory Visit: Payer: Self-pay | Admitting: Physician Assistant

## 2016-07-08 ENCOUNTER — Telehealth: Payer: Self-pay

## 2016-07-08 NOTE — Telephone Encounter (Signed)
Called patient and left a message for her to call us back to reschedule her 08/04/16 appointment with Chelle due to her being out of the office this day. Anyone can schedule her an appointment on [place] next available day in office.

## 2016-07-10 ENCOUNTER — Other Ambulatory Visit: Payer: Self-pay | Admitting: Cardiology

## 2016-07-24 ENCOUNTER — Other Ambulatory Visit: Payer: Self-pay | Admitting: Cardiology

## 2016-08-04 ENCOUNTER — Ambulatory Visit: Payer: Medicare Other | Admitting: Physician Assistant

## 2016-08-18 ENCOUNTER — Ambulatory Visit (INDEPENDENT_AMBULATORY_CARE_PROVIDER_SITE_OTHER): Payer: Medicare Other | Admitting: Physician Assistant

## 2016-08-18 ENCOUNTER — Encounter: Payer: Self-pay | Admitting: Physician Assistant

## 2016-08-18 VITALS — BP 139/73 | HR 77 | Temp 98.3°F | Resp 18 | Ht 65.0 in | Wt 196.0 lb

## 2016-08-18 DIAGNOSIS — E119 Type 2 diabetes mellitus without complications: Secondary | ICD-10-CM

## 2016-08-18 DIAGNOSIS — I119 Hypertensive heart disease without heart failure: Secondary | ICD-10-CM | POA: Diagnosis not present

## 2016-08-18 DIAGNOSIS — E785 Hyperlipidemia, unspecified: Secondary | ICD-10-CM | POA: Diagnosis not present

## 2016-08-18 NOTE — Patient Instructions (Addendum)
Continue to exercises and work on limiting carbohydrates from bread and starches. Try to replace bread with a non bread or bread alternative for one meal a day.     IF you received an x-ray today, you will receive an invoice from [facility]. Please contact [facility] at [phone] with questions or concerns regarding your invoice.   IF you received labwork today, you will receive an invoice from [facility]. Please contact [facility] at [phone] with questions or concerns regarding your invoice.   Our billing staff will not be able to assist you with questions regarding bills from these companies.  You will be contacted with the lab results as soon as they are available. The fastest way to get your results is to activate your My Chart account. Instructions are located on the last page of this paperwork. If you have not heard from [place] regarding the results in 2 weeks, please contact this office.      Carbohydrate Counting for Diabetes Mellitus, Adult Carbohydrate counting is a method for keeping track of how many carbohydrates you eat. Eating carbohydrates naturally increases the amount of sugar (glucose) in the blood. Counting how many carbohydrates you eat helps keep your blood glucose within normal limits, which helps you manage your diabetes (diabetes mellitus). It is important to know how many carbohydrates you can safely have in each meal. This is different for every person. A diet and nutrition specialist (registered dietitian) can help you make a meal plan and calculate how many carbohydrates you should have at each meal and snack. Carbohydrates are found in the following foods:  Grains, such as breads and cereals.  Dried beans and soy products.  Starchy vegetables, such as potatoes, peas, and corn.  Fruit and fruit juices.  Milk and yogurt.  Sweets and snack foods, such as cake, cookies, candy, chips, and soft drinks. How do I count carbohydrates? There are  two ways to count carbohydrates in food. You can use either of the methods or a combination of both. Reading "Nutrition Facts" on packaged food  The "Nutrition Facts" list is included on the labels of almost all packaged foods and beverages in the [place] It includes:  The serving size.  Information about nutrients in each serving, including the grams (g) of carbohydrate per serving. To use the "Nutrition Facts":  Decide how many servings you will have.  Multiply the number of servings by the number of carbohydrates per serving.  The resulting number is the total amount of carbohydrates that you will be having. Learning standard serving sizes of other foods  When you eat foods containing carbohydrates that are not packaged or do not include "Nutrition Facts" on the label, you need to measure the servings in order to count the amount of carbohydrates:  Measure the foods that you will eat with a food scale or measuring cup, if needed.  Decide how many standard-size servings you will eat.  Multiply the number of servings by 15. Most carbohydrate-rich foods have about 15 g of carbohydrates per serving.  For example, if you eat 8 oz (170 g) of strawberries, you will have eaten 2 servings and 30 g of carbohydrates (2 servings x 15 g = 30 g).  For foods that have more than one food mixed, such as soups and casseroles, you must count the carbohydrates in each food that is included. The following list contains standard serving sizes of common carbohydrate-rich foods. Each of these servings has about 15 g of carbohydrates:  hamburger bun or  English muffin.   oz (15 mL) syrup.   oz (14 g) jelly.  1 slice of bread.  1 six-inch tortilla.  3 oz (85 g) cooked rice or pasta.  4 oz (113 g) cooked dried beans.  4 oz (113 g) starchy vegetable, such as peas, corn, or potatoes.  4 oz (113 g) hot cereal.  4 oz (113 g) mashed potatoes or  of a large baked potato.  4 oz (113 g) canned or  frozen fruit.  4 oz (120 mL) fruit juice.  4-6 crackers.  6 chicken nuggets.  6 oz (170 g) unsweetened dry cereal.  6 oz (170 g) plain fat-free yogurt or yogurt sweetened with artificial sweeteners.  8 oz (240 mL) milk.  8 oz (170 g) fresh fruit or one small piece of fruit.  24 oz (680 g) popped popcorn. Example of carbohydrate counting Sample meal   3 oz (85 g) chicken breast.  6 oz (170 g) brown rice.  4 oz (113 g) corn.  8 oz (240 mL) milk.  8 oz (170 g) strawberries with sugar-free whipped topping. Carbohydrate calculation  1. Identify the foods that contain carbohydrates:  Rice.  Corn.  Milk.  Strawberries. 2. Calculate how many servings you have of each food:  2 servings rice.  1 serving corn.  1 serving milk.  1 serving strawberries. 3. Multiply each number of servings by 15 g:  2 servings rice x 15 g = 30 g.  1 serving corn x 15 g = 15 g.  1 serving milk x 15 g = 15 g.  1 serving strawberries x 15 g = 15 g. 4. Add together all of the amounts to find the total grams of carbohydrates eaten:  30 g + 15 g + 15 g + 15 g = 75 g of carbohydrates total. This information is not intended to replace advice given to you by your health care provider. Make sure you discuss any questions you have with your health care provider. Document Released: 05/18/2005 Document Revised: 12/06/2015 Document Reviewed: 10/30/2015 Elsevier Interactive Patient Education  2017 [place]. Diet for Metabolic Syndrome Metabolic syndrome is a disorder that includes at least three of these conditions:  Abdominal obesity.  Too much sugar in your blood.  High blood pressure.  Higher than normal amount of fat (lipids) in your blood.  Lower than normal level of "good" cholesterol (HDL). Following a healthy diet can help to keep metabolic syndrome under control. It can also help to prevent the development of conditions that are associated with metabolic syndrome, such as  diabetes, heart disease, and stroke. Along with exercise, a healthy diet:  Helps to improve the way that the body uses insulin.  Promotes weight loss. A common goal for people with this condition is to lose at least 7 to 10 percent of their starting weight. What do I need to know about this diet?  Use the glycemic index (GI) to plan your meals. The index tells you how quickly a food will raise your blood sugar. Choose foods that have low GI values. These foods take a longer time to raise blood sugar.  Keep track of how many calories you take in. Eating the right amount of calories will help your achieve a healthy weight.  You may want to follow a Mediterranean diet. This diet includes lots of vegetables, lean meats or fish, whole grains, fruits, and healthy oils and fats. What foods can I eat? Grains  Stone-ground whole wheat. Pumpernickel bread. Whole-grain bread, crackers, tortillas, cereal, and pasta. Unsweetened oatmeal.Bulgur.Barley.Quinoa.Brown rice or wild rice. Vegetables  Lettuce. Spinach. Peas. Beets. Cauliflower. Cabbage. Broccoli. Carrots. Tomatoes. Squash. Eggplant. Herbs. Peppers. Onions. Cucumbers. Brussels sprouts. Sweet potatoes. Yams. Beans. Lentils. Fruits  Berries. Apples. Oranges. Grapes. Mango. Pomegranate. Kiwi. Cherries. Meats and Other Protein Sources  Seafood and shellfish. Lean meats.Poultry. Tofu. Dairy  Low-fat or fat-free dairy products, such as milk, yogurt, and cheese. Beverages  Water. Low-fat milk. Milk alternatives, like soy milk or almond milk. Real fruit juice. Condiments  Low-sugar or sugar-free ketchup, barbecue sauce, and mayonnaise. Mustard. Relish. Fats and Oils  Avocado. Canola or olive oil. Nuts and nut butters.Seeds. The items listed above may not be a complete list of recommended foods or beverages. Contact your dietitian for more options.  What foods are not recommended? Red meat. Palm oil and coconut oil. Processed foods. Fried  foods. Alcohol. Sweetened drinks, such as iced tea and soda. Sweets. Salty foods. The items listed above may not be a complete list of foods and beverages to avoid. Contact your dietitian for more information.  This information is not intended to replace advice given to you by your health care provider. Make sure you discuss any questions you have with your health care provider. Document Released: 10/02/2014 Document Revised: 09/27/2015 Document Reviewed: 05/30/2014 Elsevier Interactive Patient Education  2017 [employer].

## 2016-08-18 NOTE — Progress Notes (Signed)
Patient ID: Angela Chambers, female    DOB: [birth year], 76 y.o.   MRN: [id number]  PCP: [name], PA-C  Chief Complaint  Patient presents with  . Follow-up    A1c recheck     Subjective:   Presents for evaluation of Diabetes.  Diarrhea on Metformin resolved with lowering the dose from 1000 mg BID to 500 mg BID. Participates in the [employer] program and walks 3-4 times/week. Diet is heavy in carbs/starches-her favorites.  Followed by ophthalmology due to glaucoma. Pressures are normal.    Review of Systems Denies chest pain, shortness of breath, HA, dizziness, vision change, nausea, vomiting, diarrhea, constipation, melena, hematochezia, dysuria, increased urinary urgency or frequency, increased hunger or thirst, unintentional weight change, unexplained myalgias or arthralgias, rash.     Patient Active Problem List   Diagnosis Date Noted  . Abnormal stress electrocardiogram test using treadmill: With Normal Imaging -> imaging confirms lack of significant CAD 06/21/2015  . Exertional chest pain 06/07/2015  . OA (osteoarthritis) of knee 09/25/2013  . Obesity (BMI 30.0-34.9)   . Palpitations   . Hypertensive heart disease without CHF 11/30/2011  . Hyperlipidemia 08/25/2011  . Other and combined forms of senile cataract 07/04/2011  . Primary open angle glaucoma of both eyes, severe stage 07/04/2011  . Diabetes mellitus type 2, uncomplicated ([place]) 31/49/7026  . PERSONAL HISTORY OF COLONIC POLYPS 02/22/2009     Prior to Admission medications   Medication Sig Start Date End Date Taking? Authorizing Provider  amLODipine (NORVASC) 5 MG tablet take 1 tablet by mouth once daily 03/24/16  Yes Laree Garron, PA-C  aspirin 81 MG tablet Take 81 mg by mouth daily.   Yes Historical Provider, MD  atorvastatin (LIPITOR) 20 MG tablet take 1 tablet by mouth once daily 05/30/16  Yes Aubreyana Saltz, PA-C  Co-Enzyme Q-10 30 MG CAPS Take 30 mg by mouth daily.   Yes  Historical Provider, MD  hydrochlorothiazide (HYDRODIURIL) 25 MG tablet take 1 tablet by mouth once daily 07/10/16  Yes Leonie Man, MD  losartan (COZAAR) 100 MG tablet take 1 tablet by mouth once daily 03/24/16  Yes Rashawna Scoles, PA-C  metFORMIN (GLUCOPHAGE) 500 MG tablet Take 2 tablets (1,000 mg total) by mouth 2 (two) times daily with a meal. 04/28/16  Yes Aamir Mclinden, PA-C  metoprolol succinate (TOPROL-XL) 50 MG 24 hr tablet take 1 tablet by mouth once daily 07/24/16  Yes Leonie Man, MD  Multiple Vitamin (MULTIVITAMIN) tablet Take 1 tablet by mouth daily.   Yes Historical Provider, MD  OVER THE COUNTER MEDICATION Co Q 10 taking once daily   Yes Historical Provider, MD  PRESCRIPTION MEDICATION Timolol 0.05 % taking 1 drop to each eye every am   Yes Historical Provider, MD  timolol (TIMOPTIC) 0.5 % ophthalmic solution Place 1 drop into both eyes 2 (two) times daily at 10 AM and 5 PM. 04/28/16  Yes Elgar Scoggins, PA-C  Travoprost, BAK Free, (TRAVATAN) 0.004 % SOLN ophthalmic solution Place 1 drop into both eyes at bedtime.   Yes Historical Provider, MD     Allergies  Allergen Reactions  . Lisinopril Cough  . Metformin And Related     Diarrhea on 1000mg  BID, tolerates 500 mg BID       Objective:  Physical Exam  Constitutional: She is oriented to person, place, and time. She appears well-developed and well-nourished. She is active and cooperative. No distress.  BP 139/73   Pulse 77  Temp 98.3 F (36.8 C) (Oral)   Resp 18   Ht 5\' 5"  (1.651 m)   Wt 196 lb (88.9 kg)   SpO2 98%   BMI 32.62 kg/m   HENT:  Head: Normocephalic and atraumatic.  Right Ear: Hearing normal.  Left Ear: Hearing normal.  Eyes: Conjunctivae are normal. No scleral icterus.  Neck: Normal range of motion. Neck supple. No thyromegaly present.  Cardiovascular: Normal rate, regular rhythm and normal heart sounds.   Pulses:      Radial pulses are 2+ on the right side, and 2+ on the left side.    Pulmonary/Chest: Effort normal and breath sounds normal.  Lymphadenopathy:       Head (right side): No tonsillar, no preauricular, no posterior auricular and no occipital adenopathy present.       Head (left side): No tonsillar, no preauricular, no posterior auricular and no occipital adenopathy present.    She has no cervical adenopathy.       Right: No supraclavicular adenopathy present.       Left: No supraclavicular adenopathy present.  Neurological: She is alert and oriented to person, place, and time. No sensory deficit.  Skin: Skin is warm, dry and intact. No rash noted. No cyanosis or erythema. Nails show no clubbing.  Psychiatric: She has a normal mood and affect. Her speech is normal and behavior is normal.       Assessment & Plan:   1. Type 2 diabetes mellitus without complication, without long-term current use of insulin ([place]) Await labs. Adjust regimen as indicated by results. - Comprehensive metabolic panel - Hemoglobin A1c - Microalbumin, urine - HM Diabetes Foot Exam  2. Hypertensive heart disease without CHF Reasonable control today. Expect improvement with continued lifestyle changes. - CBC with Differential/Platelet - Comprehensive metabolic panel  3. Hyperlipidemia, unspecified hyperlipidemia type Await labs. Adjust regimen as indicated by results. - Comprehensive metabolic panel - Lipid panel  Return in about 3 months (around 11/18/2016) for re-evaluaiton of diabetes.    Fara Chute, PA-C Physician Assistant-Certified [facility]

## 2016-08-18 NOTE — Progress Notes (Signed)
Patient ID: Angela Chambers, female    DOB: 03/29/1941, 76 y.o.   MRN: [id number]  PCP: [name], PA-C  Chief Complaint  Patient presents with  . Follow-up    A1c recheck     Subjective:   Presents for evaluation of DM follow up. She states at her last visit her metformin was increase to 1000mg  BID which caused diarrhea. She sent a message to [name], PA-C  and was told to reduce to her tolerable dose of 500mg  BID. She says she still exercises 3-4x/week with [employer] program. Her diet remains uncharged since her last visit. Diet is heavy on bread and starches.   Patient sees ophthalmologist every 3 months and states last eye pressure measurements 13R 14L one month ago.  Review of Systems  Constitutional: Negative for fatigue.  Respiratory: Negative for shortness of breath.   Cardiovascular: Negative for chest pain, palpitations and leg swelling.  Gastrointestinal: Diarrhea: When she was on 1000mg  Metformin BID resolved.  Endocrine: Negative for polydipsia, polyphagia and polyuria.  All other systems reviewed and are negative.   Patient Active Problem List   Diagnosis Date Noted  . Abnormal stress electrocardiogram test using treadmill: With Normal Imaging -> imaging confirms lack of significant CAD 06/21/2015  . Exertional chest pain 06/07/2015  . OA (osteoarthritis) of knee 09/25/2013  . Obesity (BMI 30.0-34.9)   . Palpitations   . Hypertensive heart disease without CHF 11/30/2011  . Hyperlipidemia 08/25/2011  . Other and combined forms of senile cataract 07/04/2011  . Primary open angle glaucoma of both eyes, severe stage 07/04/2011  . Diabetes mellitus type 2, uncomplicated ([place]) 29/92/4268  . PERSONAL HISTORY OF COLONIC POLYPS 02/22/2009    Prior to Admission medications   Medication Sig Start Date End Date Taking? Authorizing Provider  amLODipine (NORVASC) 5 MG tablet take 1 tablet by mouth once daily 03/24/16  Yes Chelle Jeffery, PA-C    aspirin 81 MG tablet Take 81 mg by mouth daily.   Yes Historical Provider, MD  atorvastatin (LIPITOR) 20 MG tablet take 1 tablet by mouth once daily 05/30/16  Yes Chelle Jeffery, PA-C  Co-Enzyme Q-10 30 MG CAPS Take 30 mg by mouth daily.   Yes Historical Provider, MD  hydrochlorothiazide (HYDRODIURIL) 25 MG tablet take 1 tablet by mouth once daily 07/10/16  Yes Leonie Man, MD  losartan (COZAAR) 100 MG tablet take 1 tablet by mouth once daily 03/24/16  Yes Chelle Jeffery, PA-C  metFORMIN (GLUCOPHAGE) 500 MG tablet Take 2 tablets (1,000 mg total) by mouth 2 (two) times daily with a meal. 04/28/16  Yes Chelle Jeffery, PA-C  metoprolol succinate (TOPROL-XL) 50 MG 24 hr tablet take 1 tablet by mouth once daily 07/24/16  Yes Leonie Man, MD  Multiple Vitamin (MULTIVITAMIN) tablet Take 1 tablet by mouth daily.   Yes Historical Provider, MD  OVER THE COUNTER MEDICATION Co Q 10 taking once daily   Yes Historical Provider, MD  PRESCRIPTION MEDICATION Timolol 0.05 % taking 1 drop to each eye every am   Yes Historical Provider, MD  timolol (TIMOPTIC) 0.5 % ophthalmic solution Place 1 drop into both eyes 2 (two) times daily at 10 AM and 5 PM. 04/28/16  Yes Chelle Jeffery, PA-C  Travoprost, BAK Free, (TRAVATAN) 0.004 % SOLN ophthalmic solution Place 1 drop into both eyes at bedtime.   Yes Historical Provider, MD    Allergies  Allergen Reactions  . Lisinopril Cough  . Metformin And Related  Diarrhea on 1000mg  BID, tolerates 500 mg BID       Objective:  Physical Exam  Constitutional: She is oriented to person, place, and time. Vital signs are normal. She appears well-developed.  Blood pressure 139/73, pulse 77, temperature 98.3 F (36.8 C), temperature source Oral, resp. rate 18, height 5\' 5"  (1.651 m), weight 196 lb (88.9 kg), SpO2 98 %.  Eyes: Pupils are equal, round, and reactive to light.  Cardiovascular: Normal rate, regular rhythm, normal heart sounds and intact distal pulses.    Pulmonary/Chest: Effort normal and breath sounds normal.  Neurological: She is alert and oriented to person, place, and time.  Skin: Skin is warm and dry.  Psychiatric: She has a normal mood and affect. Her speech is normal and behavior is normal. Judgment and thought content normal. Cognition and memory are normal.     Assessment & Plan:  1. Type 2 diabetes mellitus without complication, without long-term current use of insulin (HCC) - Comprehensive metabolic panel - Hemoglobin A1c - Microalbumin, urine - HM Diabetes Foot Exam  2. Hypertensive heart disease without CHF - CBC with Differential/Platelet - Comprehensive metabolic panel  3. Hyperlipidemia, unspecified hyperlipidemia type - Comprehensive metabolic panel - Lipid panel

## 2016-08-19 LAB — COMPREHENSIVE METABOLIC PANEL
A/G RATIO: 2.2 (ref 1.2–2.2)
ALT: 11 IU/L (ref 0–32)
AST: 9 IU/L (ref 0–40)
Albumin: 4.2 g/dL (ref 3.5–4.8)
Alkaline Phosphatase: 93 IU/L (ref 39–117)
BUN/Creatinine Ratio: 24 (ref 12–28)
BUN: 22 mg/dL (ref 8–27)
Bilirubin Total: 0.3 mg/dL (ref 0.0–1.2)
CO2: 25 mmol/L (ref 18–29)
Calcium: 9.9 mg/dL (ref 8.7–10.3)
Chloride: 99 mmol/L (ref 96–106)
Creatinine, Ser: 0.9 mg/dL (ref 0.57–1.00)
GFR calc Af Amer: 72 mL/min/{1.73_m2} (ref >59)
GFR calc non Af Amer: 62 mL/min/{1.73_m2} (ref >59)
GLOBULIN, TOTAL: 1.9 g/dL (ref 1.5–4.5)
Glucose: 183 mg/dL — ABNORMAL HIGH (ref 65–99)
POTASSIUM: 4 mmol/L (ref 3.5–5.2)
SODIUM: 142 mmol/L (ref 134–144)
Total Protein: 6.1 g/dL (ref 6.0–8.5)

## 2016-08-19 LAB — CBC WITH DIFFERENTIAL/PLATELET
BASOS ABS: 0 10*3/uL (ref 0.0–0.2)
Basos: 1 %
EOS (ABSOLUTE): 0.1 10*3/uL (ref 0.0–0.4)
EOS: 2 %
HEMOGLOBIN: 11.5 g/dL (ref 11.1–15.9)
Hematocrit: 33.1 % — ABNORMAL LOW (ref 34.0–46.6)
Immature Grans (Abs): 0 10*3/uL (ref 0.0–0.1)
Immature Granulocytes: 0 %
LYMPHS ABS: 2.2 10*3/uL (ref 0.7–3.1)
LYMPHS: 47 %
MCH: 29 pg (ref 26.6–33.0)
MCHC: 34.7 g/dL (ref 31.5–35.7)
MCV: 83 fL (ref 79–97)
MONOCYTES: 8 %
Monocytes Absolute: 0.4 10*3/uL (ref 0.1–0.9)
Neutrophils Absolute: 1.9 10*3/uL (ref 1.4–7.0)
Neutrophils: 42 %
Platelets: 310 10*3/uL (ref 150–379)
RBC: 3.97 x10E6/uL (ref 3.77–5.28)
RDW: 15.2 % (ref 12.3–15.4)
WBC: 4.7 10*3/uL (ref 3.4–10.8)

## 2016-08-19 LAB — LIPID PANEL
Chol/HDL Ratio: 2.3 ratio units (ref 0.0–4.4)
Cholesterol, Total: 172 mg/dL (ref 100–199)
HDL: 74 mg/dL (ref >39)
LDL Calculated: 82 mg/dL (ref 0–99)
Triglycerides: 81 mg/dL (ref 0–149)
VLDL Cholesterol Cal: 16 mg/dL (ref 5–40)

## 2016-08-19 LAB — MICROALBUMIN, URINE: MICROALBUM., U, RANDOM: 3.9 ug/mL

## 2016-08-19 LAB — HEMOGLOBIN A1C
ESTIMATED AVERAGE GLUCOSE: 171 mg/dL
HEMOGLOBIN A1C: 7.6 % — AB (ref 4.8–5.6)

## 2016-08-21 ENCOUNTER — Encounter: Payer: Self-pay | Admitting: Physician Assistant

## 2016-08-26 LAB — COLOGUARD: COLOGUARD: NEGATIVE

## 2016-08-29 ENCOUNTER — Other Ambulatory Visit: Payer: Self-pay | Admitting: Physician Assistant

## 2016-09-04 ENCOUNTER — Encounter: Payer: Self-pay | Admitting: Physician Assistant

## 2016-09-22 ENCOUNTER — Other Ambulatory Visit: Payer: Self-pay | Admitting: Physician Assistant

## 2016-09-22 DIAGNOSIS — I10 Essential (primary) hypertension: Secondary | ICD-10-CM

## 2016-10-20 ENCOUNTER — Other Ambulatory Visit: Payer: Self-pay | Admitting: Physician Assistant

## 2016-10-20 DIAGNOSIS — E119 Type 2 diabetes mellitus without complications: Secondary | ICD-10-CM

## 2016-11-17 ENCOUNTER — Ambulatory Visit (INDEPENDENT_AMBULATORY_CARE_PROVIDER_SITE_OTHER): Payer: Medicare Other | Admitting: Physician Assistant

## 2016-11-17 ENCOUNTER — Encounter: Payer: Self-pay | Admitting: Physician Assistant

## 2016-11-17 VITALS — BP 118/80 | HR 66 | Temp 97.8°F | Resp 18 | Ht 65.0 in | Wt 194.2 lb

## 2016-11-17 DIAGNOSIS — D689 Coagulation defect, unspecified: Secondary | ICD-10-CM | POA: Insufficient documentation

## 2016-11-17 DIAGNOSIS — E119 Type 2 diabetes mellitus without complications: Secondary | ICD-10-CM

## 2016-11-17 DIAGNOSIS — E785 Hyperlipidemia, unspecified: Secondary | ICD-10-CM

## 2016-11-17 DIAGNOSIS — M170 Bilateral primary osteoarthritis of knee: Secondary | ICD-10-CM

## 2016-11-17 DIAGNOSIS — I119 Hypertensive heart disease without heart failure: Secondary | ICD-10-CM

## 2016-11-17 NOTE — Progress Notes (Signed)
Patient ID: Angela Chambers, female    DOB: 1940-10-15, 76 y.o.   MRN: [id number]  PCP: [name], PA-C  Chief Complaint  Patient presents with  . Diabetes    3 month check up  . Follow-up    Subjective:   Presents for evaluation of diabetes.  Reports doing really well, better than ever. Home glucose 130-160's, highest in the mornings. Making healthier eating choices. Exercising TIW.  Eye exam is scheduled for 12/2016.    Review of Systems  Constitutional: Negative for activity change, appetite change, fatigue and unexpected weight change.  HENT: Negative for congestion, dental problem, ear pain, hearing loss, mouth sores, postnasal drip, rhinorrhea, sneezing, sore throat, tinnitus and trouble swallowing.   Eyes: Positive for visual disturbance (visual field deficit is managed by her eye specialist). Negative for photophobia, pain and redness.  Respiratory: Negative for cough, chest tightness and shortness of breath.   Cardiovascular: Negative for chest pain, palpitations and leg swelling.  Gastrointestinal: Negative for abdominal pain, blood in stool, constipation, diarrhea, nausea and vomiting.  Endocrine: Negative for cold intolerance, heat intolerance, polydipsia, polyphagia and polyuria.  Genitourinary: Negative for dysuria, frequency, hematuria and urgency.  Musculoskeletal: Positive for arthralgias (knees). Negative for gait problem, myalgias and neck stiffness.  Skin: Negative for rash.  Neurological: Negative for dizziness, speech difficulty, weakness, light-headedness, numbness and headaches.  Hematological: Negative for adenopathy.  Psychiatric/Behavioral: Positive for sleep disturbance (due to recent increased temperatures). Negative for confusion. The patient is not nervous/anxious.        Patient Active Problem List   Diagnosis Date Noted  . Clotting disorder ([place]) 11/17/2016  . Abnormal stress electrocardiogram test using treadmill: With  Normal Imaging -> imaging confirms lack of significant CAD 06/21/2015  . Exertional chest pain 06/07/2015  . OA (osteoarthritis) of knee 09/25/2013  . Obesity (BMI 30.0-34.9)   . Palpitations   . Hypertensive heart disease without CHF 11/30/2011  . Hyperlipidemia 08/25/2011  . Other and combined forms of senile cataract 07/04/2011  . Primary open angle glaucoma of both eyes, severe stage 07/04/2011  . Diabetes mellitus type 2, uncomplicated ([place]) 08/67/6195  . PERSONAL HISTORY OF COLONIC POLYPS 02/22/2009     Prior to Admission medications   Medication Sig Start Date End Date Taking? Authorizing Provider  amLODipine (NORVASC) 5 MG tablet take 1 tablet by mouth once daily 09/22/16  Yes Yahayra Geis, PA-C  aspirin 81 MG tablet Take 81 mg by mouth daily.   Yes [provider]  atorvastatin (LIPITOR) 20 MG tablet take 1 tablet by mouth once daily 08/31/16  Yes Safiyyah Vasconez, PA-C  Co-Enzyme Q-10 30 MG CAPS Take 30 mg by mouth daily.   Yes [provider]  hydrochlorothiazide (HYDRODIURIL) 25 MG tablet take 1 tablet by mouth once daily 07/10/16  Yes Leonie Man, MD  losartan (COZAAR) 100 MG tablet take 1 tablet by mouth once daily 09/22/16  Yes Marty Sadlowski, PA-C  metFORMIN (GLUCOPHAGE) 500 MG tablet take 2 tablets by mouth twice a day with meals 10/20/16  Yes Sahiti Joswick, PA-C  metoprolol succinate (TOPROL-XL) 50 MG 24 hr tablet take 1 tablet by mouth once daily 07/24/16  Yes Leonie Man, MD  Multiple Vitamin (MULTIVITAMIN) tablet Take 1 tablet by mouth daily.   Yes [provider]  OVER THE COUNTER MEDICATION Co Q 10 taking once daily   Yes [provider]  PRESCRIPTION MEDICATION Timolol 0.05 % taking 1 drop to each eye every am  Yes [provider]  timolol (TIMOPTIC) 0.5 % ophthalmic solution Place 1 drop into both eyes 2 (two) times daily at 10 AM and 5 PM. 04/28/16  Yes Maurisio Ruddy, PA-C  Travoprost, BAK Free, (TRAVATAN)  0.004 % SOLN ophthalmic solution Place 1 drop into both eyes at bedtime.   Yes [provider]     Allergies  Allergen Reactions  . Lisinopril Cough  . Metformin And Related     Diarrhea on 1000mg  BID, tolerates 500 mg BID       Objective:  Physical Exam  Constitutional: She is oriented to person, place, and time. She appears well-developed and well-nourished. She is active and cooperative. No distress.  BP 118/80 (BP Location: Right Arm, Patient Position: Sitting, Cuff Size: Large)   Pulse 66   Temp 97.8 F (36.6 C) (Oral)   Resp 18   Ht 5\' 5"  (1.651 m)   Wt 194 lb 3.2 oz (88.1 kg)   SpO2 97%   BMI 32.32 kg/m   HENT:  Head: Normocephalic and atraumatic.  Right Ear: Hearing normal.  Left Ear: Hearing normal.  Eyes: Conjunctivae are normal. No scleral icterus.  Neck: Normal range of motion. Neck supple. No thyromegaly present.  Cardiovascular: Normal rate, regular rhythm and normal heart sounds.   Pulses:      Radial pulses are 2+ on the right side, and 2+ on the left side.  Pulmonary/Chest: Effort normal and breath sounds normal.  Lymphadenopathy:       Head (right side): No tonsillar, no preauricular, no posterior auricular and no occipital adenopathy present.       Head (left side): No tonsillar, no preauricular, no posterior auricular and no occipital adenopathy present.    She has no cervical adenopathy.       Right: No supraclavicular adenopathy present.       Left: No supraclavicular adenopathy present.  Neurological: She is alert and oriented to person, place, and time. No sensory deficit.  Skin: Skin is warm, dry and intact. No rash noted. No cyanosis or erythema. Nails show no clubbing.  Psychiatric: She has a normal mood and affect. Her speech is normal and behavior is normal.           Assessment & Plan:   Problem List Items Addressed This Visit    Diabetes mellitus type 2, uncomplicated ([place]) (Chronic)    Await labs. Adjust regimen as  indicated by results. Anticipate improved control given improved home readings, healthier eating and increased exercise.      Relevant Orders   Comprehensive metabolic panel (Completed)   Hemoglobin A1c (Completed)   Hyperlipidemia (Chronic)    Await labs. Adjust regimen as indicated by results. Goal LDL <70 due to diabetes.      Relevant Orders   Comprehensive metabolic panel (Completed)   Lipid panel (Completed)   Hypertensive heart disease without CHF - Primary (Chronic)    Well controlled. Continue current treatment.      Relevant Orders   CBC with Differential/Platelet (Completed)   Comprehensive metabolic panel (Completed)   TSH (Completed)   T4, free (Completed)   OA (osteoarthritis) of knee    COntinue to maintain active lifestyle.      RESOLVED: Clotting disorder ([place])       Return in about 3 months (around 02/17/2017) for re-evaluation of diabetes, blood pressure, cholesterol, etc..   Fara Chute, PA-C [facility]

## 2016-11-17 NOTE — Progress Notes (Signed)
Subjective:    Patient ID: Angela Chambers, female    DOB: 09-10-1940, 76 y.o.   MRN: [id number] PCP: [name], PA-C Chief Complaint  Patient presents with  . Diabetes    3 month check up  . Follow-up    HPI: 76 y/o F presents today for diabetes follow up.  She states that she is controlling her sugars better than she had been before. She runs from 135-160s, with her sugars being on the higher end in the morning compared to throughout the day,  She is eating well and going to the gym 3 days a week where she uses the elliptical and the treadmill. She has not been sleeping well due to the heat.  She sees the eye doctor regularly and has an appointment scheduled in august. Her field of vision is perfect in her left eye, but continues to be irregular in her right eye. The ophthamologist has instructed her to continue using the eye drops and they are monitoring her glaucoma.  She does regular foot checks at home- wears well fitting shoes and keeps her toenails trimmed.   Patient Active Problem List   Diagnosis Date Noted  . Abnormal stress electrocardiogram test using treadmill: With Normal Imaging -> imaging confirms lack of significant CAD 06/21/2015  . Exertional chest pain 06/07/2015  . OA (osteoarthritis) of knee 09/25/2013  . Obesity (BMI 30.0-34.9)   . Palpitations   . Hypertensive heart disease without CHF 11/30/2011  . Hyperlipidemia 08/25/2011  . Other and combined forms of senile cataract 07/04/2011  . Primary open angle glaucoma of both eyes, severe stage 07/04/2011  . Diabetes mellitus type 2, uncomplicated ([place]) 42/87/6811  . PERSONAL HISTORY OF COLONIC POLYPS 02/22/2009   Past Medical History:  Diagnosis Date  . Arthritis   . Cataract   . Diabetes mellitus   . Fibroids   . Glaucoma   . Hyperlipidemia   . Hypertension 07 /2013    WITH EF GREATER THAN 55 % WITH MILD MR BUT NO PROLASPE ,ESSENTIALLY NORMAL DONE TO EVALUATE PALPITATIONS .  Marland Kitchen Obesity (BMI  30.0-34.9)   . Osteoporosis   . Palpitations    CONTROLLED ON BETA BLOCKER   Prior to Admission medications   Medication Sig Start Date End Date Taking? Authorizing Provider  amLODipine (NORVASC) 5 MG tablet take 1 tablet by mouth once daily 09/22/16  Yes Jeffery, Chelle, PA-C  aspirin 81 MG tablet Take 81 mg by mouth daily.   Yes [provider]  atorvastatin (LIPITOR) 20 MG tablet take 1 tablet by mouth once daily 08/31/16  Yes Jeffery, Chelle, PA-C  Co-Enzyme Q-10 30 MG CAPS Take 30 mg by mouth daily.   Yes [provider]  hydrochlorothiazide (HYDRODIURIL) 25 MG tablet take 1 tablet by mouth once daily 07/10/16  Yes Leonie Man, MD  losartan (COZAAR) 100 MG tablet take 1 tablet by mouth once daily 09/22/16  Yes Jeffery, Chelle, PA-C  metFORMIN (GLUCOPHAGE) 500 MG tablet take 2 tablets by mouth twice a day with meals 10/20/16  Yes Jeffery, Chelle, PA-C  metoprolol succinate (TOPROL-XL) 50 MG 24 hr tablet take 1 tablet by mouth once daily 07/24/16  Yes Leonie Man, MD  Multiple Vitamin (MULTIVITAMIN) tablet Take 1 tablet by mouth daily.   Yes [provider]  OVER THE COUNTER MEDICATION Co Q 10 taking once daily   Yes [provider]  PRESCRIPTION MEDICATION Timolol 0.05 % taking 1 drop to each eye every am  Yes [provider]  timolol (TIMOPTIC) 0.5 % ophthalmic solution Place 1 drop into both eyes 2 (two) times daily at 10 AM and 5 PM. 04/28/16  Yes Jeffery, Chelle, PA-C  Travoprost, BAK Free, (TRAVATAN) 0.004 % SOLN ophthalmic solution Place 1 drop into both eyes at bedtime.   Yes [provider]   Allergies  Allergen Reactions  . Lisinopril Cough  . Metformin And Related     Diarrhea on 1000mg  BID, tolerates 500 mg BID    Review of Systems  Constitutional: Negative for fatigue.  HENT: Negative.   Eyes: Positive for visual disturbance (visual field disturbance in right eye).  Respiratory: Negative for chest tightness and  shortness of breath.   Cardiovascular: Negative for chest pain, palpitations and leg swelling.  Gastrointestinal: Negative for abdominal pain, blood in stool, constipation, diarrhea, nausea and vomiting.  Endocrine: Negative.  Negative for polydipsia and polyuria.  Genitourinary: Negative.  Negative for dysuria, frequency, hematuria and urgency.  Musculoskeletal: Positive for arthralgias (knee pains ).  Skin: Negative.   Allergic/Immunologic: Negative.   Neurological: Negative for dizziness, light-headedness, numbness and headaches.  Hematological: Negative.   Psychiatric/Behavioral: Positive for sleep disturbance.       Objective:   Physical Exam  Constitutional: She is oriented to person, place, and time. She appears well-developed and well-nourished. No distress.  BP 118/80 (BP Location: Right Arm, Patient Position: Sitting, Cuff Size: Large)   Pulse 66   Temp 97.8 F (36.6 C) (Oral)   Resp 18   Ht 5\' 5"  (1.651 m)   Wt 194 lb 3.2 oz (88.1 kg)   SpO2 97%   BMI 32.32 kg/m    HENT:  Head: Normocephalic and atraumatic.  Eyes: Conjunctivae and EOM are normal. Pupils are equal, round, and reactive to light.  Neck: Normal range of motion. Neck supple. No thyromegaly present.  Cardiovascular: Normal rate, regular rhythm, normal heart sounds and intact distal pulses.   Pulmonary/Chest: Effort normal and breath sounds normal.  Lymphadenopathy:    She has no cervical adenopathy.  Neurological: She is alert and oriented to person, place, and time.  Skin: Skin is warm and dry. She is not diaphoretic.  Psychiatric: She has a normal mood and affect. Her behavior is normal. Judgment and thought content normal.      Assessment & Plan:  1. Hypertensive heart disease without CHF Hypertension well managed on Norvasc, HCTZ, and Toprol-XL, and Losartan. Continue taking medications as prescribed.  - CBC with Differential/Platelet - Comprehensive metabolic panel - TSH - T4, free  2. Type  2 diabetes mellitus without complication, without long-term current use of insulin (HCC) Condition improving according to patient. Continue checking blood glucose. Continue taking Metformin as prescribed. - Comprehensive metabolic panel - Hemoglobin A1c  3. Hyperlipidemia, unspecified hyperlipidemia type Condition controlled with Lipitor, continue taking as prescribed. - Comprehensive metabolic panel - Lipid panel  4. Osteoarthritis of both knees, unspecified osteoarthritis type History of bilateral knee replacements. Continue to be active as tolerated.   5. Clotting disorder ([place]) Chart reviewed, diagnosis used during work up for angina. Labs reveals no clotting disorder. This diagnosis will be removed from problem list.   Return in about 3 months (around 02/17/2017) for re-evaluation of diabetes, blood pressure, cholesterol, etc..

## 2016-11-17 NOTE — Patient Instructions (Addendum)
Keep up the GREAT work!!!    IF you received an x-ray today, you will receive an invoice from [facility]. Please contact [facility] at [phone] with questions or concerns regarding your invoice.   IF you received labwork today, you will receive an invoice from [facility]. Please contact [facility] at [phone] with questions or concerns regarding your invoice.   Our billing staff will not be able to assist you with questions regarding bills from these companies.  You will be contacted with the lab results as soon as they are available. The fastest way to get your results is to activate your My Chart account. Instructions are located on the last page of this paperwork. If you have not heard from us regarding the results in 2 weeks, please contact this office.     

## 2016-11-18 LAB — CBC WITH DIFFERENTIAL/PLATELET
BASOS: 1 %
Basophils Absolute: 0.1 10*3/uL (ref 0.0–0.2)
EOS (ABSOLUTE): 0.1 10*3/uL (ref 0.0–0.4)
Eos: 2 %
Hematocrit: 34.8 % (ref 34.0–46.6)
Hemoglobin: 11.9 g/dL (ref 11.1–15.9)
IMMATURE GRANULOCYTES: 0 %
Immature Grans (Abs): 0 10*3/uL (ref 0.0–0.1)
Lymphocytes Absolute: 2.3 10*3/uL (ref 0.7–3.1)
Lymphs: 46 %
MCH: 28.3 pg (ref 26.6–33.0)
MCHC: 34.2 g/dL (ref 31.5–35.7)
MCV: 83 fL (ref 79–97)
MONOS ABS: 0.3 10*3/uL (ref 0.1–0.9)
Monocytes: 7 %
NEUTROS PCT: 44 %
Neutrophils Absolute: 2.2 10*3/uL (ref 1.4–7.0)
PLATELETS: 308 10*3/uL (ref 150–379)
RBC: 4.2 x10E6/uL (ref 3.77–5.28)
RDW: 15.4 % (ref 12.3–15.4)
WBC: 5 10*3/uL (ref 3.4–10.8)

## 2016-11-18 LAB — COMPREHENSIVE METABOLIC PANEL
ALK PHOS: 104 IU/L (ref 39–117)
ALT: 11 IU/L (ref 0–32)
AST: 12 IU/L (ref 0–40)
Albumin/Globulin Ratio: 1.5 (ref 1.2–2.2)
Albumin: 4.3 g/dL (ref 3.5–4.8)
BILIRUBIN TOTAL: 0.6 mg/dL (ref 0.0–1.2)
BUN/Creatinine Ratio: 25 (ref 12–28)
BUN: 25 mg/dL (ref 8–27)
CHLORIDE: 100 mmol/L (ref 96–106)
CO2: 24 mmol/L (ref 20–29)
Calcium: 9.9 mg/dL (ref 8.7–10.3)
Creatinine, Ser: 0.99 mg/dL (ref 0.57–1.00)
GFR calc Af Amer: 64 mL/min/{1.73_m2} (ref >59)
GFR calc non Af Amer: 56 mL/min/{1.73_m2} — ABNORMAL LOW (ref >59)
GLUCOSE: 164 mg/dL — AB (ref 65–99)
Globulin, Total: 2.8 g/dL (ref 1.5–4.5)
Potassium: 4.1 mmol/L (ref 3.5–5.2)
Sodium: 141 mmol/L (ref 134–144)
Total Protein: 7.1 g/dL (ref 6.0–8.5)

## 2016-11-18 LAB — LIPID PANEL
CHOLESTEROL TOTAL: 179 mg/dL (ref 100–199)
Chol/HDL Ratio: 2.6 ratio (ref 0.0–4.4)
HDL: 68 mg/dL (ref >39)
LDL Calculated: 84 mg/dL (ref 0–99)
Triglycerides: 134 mg/dL (ref 0–149)
VLDL Cholesterol Cal: 27 mg/dL (ref 5–40)

## 2016-11-18 LAB — HEMOGLOBIN A1C
ESTIMATED AVERAGE GLUCOSE: 174 mg/dL
Hgb A1c MFr Bld: 7.7 % — ABNORMAL HIGH (ref 4.8–5.6)

## 2016-11-18 LAB — T4, FREE: FREE T4: 1.21 ng/dL (ref 0.82–1.77)

## 2016-11-18 LAB — TSH: TSH: 2.38 u[IU]/mL (ref 0.450–4.500)

## 2016-11-29 ENCOUNTER — Encounter: Payer: Self-pay | Admitting: Physician Assistant

## 2016-11-29 NOTE — Assessment & Plan Note (Signed)
Well controlled. Continue current treatment. 

## 2016-11-29 NOTE — Assessment & Plan Note (Signed)
Await labs. Adjust regimen as indicated by results. Anticipate improved control given improved home readings, healthier eating and increased exercise.

## 2016-11-29 NOTE — Assessment & Plan Note (Signed)
Await labs. Adjust regimen as indicated by results. Goal LDL <70 due to diabetes.

## 2016-11-29 NOTE — Assessment & Plan Note (Signed)
COntinue to maintain active lifestyle.

## 2016-12-02 ENCOUNTER — Other Ambulatory Visit: Payer: Self-pay | Admitting: Physician Assistant

## 2016-12-25 DIAGNOSIS — H401133 Primary open-angle glaucoma, bilateral, severe stage: Secondary | ICD-10-CM | POA: Diagnosis not present

## 2016-12-28 DIAGNOSIS — H401133 Primary open-angle glaucoma, bilateral, severe stage: Secondary | ICD-10-CM | POA: Diagnosis not present

## 2017-02-03 ENCOUNTER — Other Ambulatory Visit: Payer: Self-pay | Admitting: Physician Assistant

## 2017-02-03 DIAGNOSIS — H409 Unspecified glaucoma: Secondary | ICD-10-CM

## 2017-02-08 ENCOUNTER — Telehealth: Payer: Self-pay

## 2017-02-08 NOTE — Telephone Encounter (Signed)
Called pt to schedule awv but pt reported that her insurance company came out to complete her awv during a home visit. -nr

## 2017-02-09 ENCOUNTER — Encounter: Payer: Self-pay | Admitting: Physician Assistant

## 2017-02-09 ENCOUNTER — Ambulatory Visit (INDEPENDENT_AMBULATORY_CARE_PROVIDER_SITE_OTHER): Payer: Medicare Other | Admitting: Physician Assistant

## 2017-02-09 VITALS — BP 106/67 | HR 74 | Temp 97.9°F | Resp 18 | Ht 65.0 in | Wt 192.0 lb

## 2017-02-09 DIAGNOSIS — E785 Hyperlipidemia, unspecified: Secondary | ICD-10-CM

## 2017-02-09 DIAGNOSIS — E669 Obesity, unspecified: Secondary | ICD-10-CM

## 2017-02-09 DIAGNOSIS — I119 Hypertensive heart disease without heart failure: Secondary | ICD-10-CM

## 2017-02-09 DIAGNOSIS — E119 Type 2 diabetes mellitus without complications: Secondary | ICD-10-CM

## 2017-02-09 NOTE — Patient Instructions (Addendum)
Keep up the great work!!!    IF you received an x-ray today, you will receive an invoice from [facility]. Please contact [facility] at [phone] with questions or concerns regarding your invoice.   IF you received labwork today, you will receive an invoice from [place]. Please contact [facility] at [phone] with questions or concerns regarding your invoice.   Our billing staff will not be able to assist you with questions regarding bills from these companies.  You will be contacted with the lab results as soon as they are available. The fastest way to get your results is to activate your My Chart account. Instructions are located on the last page of this paperwork. If you have not heard from [place] regarding the results in 2 weeks, please contact this office.

## 2017-02-09 NOTE — Progress Notes (Signed)
Patient ID: Angela Chambers, female    DOB: 1940-06-16, 76 y.o.   MRN: [id number]  PCP: [name], PA-C  Chief Complaint  Patient presents with  . Hyperlipidemia  . Hypertension  . Diabetes  . Follow-up    Subjective:   Presents for evaluation of her chronic medical problems: HTN, diabetes, hyperlipidemia.  She feels pretty good. Going to the gym-stationary bike and treadmill. "Watching everything that I put in my mouth." Lost about 6 lbs . Visiting nurse with her insurance company performed her foot exam. Eye exam 1-2 months ago. Next visit in Januray. Fasting glucose 130-160. One reading 200, >3 months ago.  Review of Systems  Constitutional: Negative for activity change, appetite change, fatigue and unexpected weight change.  HENT: Negative for congestion, dental problem, ear pain, hearing loss, mouth sores, postnasal drip, rhinorrhea, sneezing, sore throat, tinnitus and trouble swallowing.   Eyes: Negative for photophobia, pain, redness and visual disturbance.  Respiratory: Negative for cough, chest tightness and shortness of breath.   Cardiovascular: Negative for chest pain, palpitations and leg swelling.  Gastrointestinal: Negative for abdominal pain, blood in stool, constipation, diarrhea, nausea and vomiting.  Endocrine: Negative for cold intolerance, heat intolerance, polydipsia, polyphagia and polyuria.  Genitourinary: Negative for dysuria, frequency, hematuria and urgency.  Musculoskeletal: Negative for arthralgias, gait problem, myalgias and neck stiffness.  Skin: Negative for rash.  Neurological: Negative for dizziness, speech difficulty, weakness, light-headedness, numbness and headaches.  Hematological: Negative for adenopathy.  Psychiatric/Behavioral: Negative for confusion and sleep disturbance. The patient is not nervous/anxious.        Patient Active Problem List   Diagnosis Date Noted  . Abnormal stress electrocardiogram test using  treadmill: With Normal Imaging -> imaging confirms lack of significant CAD 06/21/2015  . Exertional chest pain 06/07/2015  . OA (osteoarthritis) of knee 09/25/2013  . Obesity (BMI 30.0-34.9)   . Palpitations   . Hypertensive heart disease without CHF 11/30/2011  . Hyperlipidemia 08/25/2011  . Other and combined forms of senile cataract 07/04/2011  . Primary open angle glaucoma of both eyes, severe stage 07/04/2011  . Diabetes mellitus type 2, uncomplicated ([place]) 67/[birth year]1937  . PERSONAL HISTORY OF COLONIC POLYPS 02/22/2009     Prior to Admission medications   Medication Sig Start Date End Date Taking? Authorizing Provider  amLODipine (NORVASC) 5 MG tablet take 1 tablet by mouth once daily 09/22/16  Yes Aliyana Dlugosz, PA-C  aspirin 81 MG tablet Take 81 mg by mouth daily.   Yes [provider]  atorvastatin (LIPITOR) 20 MG tablet take 1 tablet by mouth once daily 12/03/16  Yes Termaine Roupp, PA-C  Co-Enzyme Q-10 30 MG CAPS Take 30 mg by mouth daily.   Yes [provider]  hydrochlorothiazide (HYDRODIURIL) 25 MG tablet take 1 tablet by mouth once daily 07/10/16  Yes Leonie Man, MD  losartan (COZAAR) 100 MG tablet take 1 tablet by mouth once daily 09/22/16  Yes Khyrie Masi, PA-C  metFORMIN (GLUCOPHAGE) 500 MG tablet take 2 tablets by mouth twice a day with meals 10/20/16  Yes Yeraldine Forney, PA-C  metoprolol succinate (TOPROL-XL) 50 MG 24 hr tablet take 1 tablet by mouth once daily 07/24/16  Yes Leonie Man, MD  Multiple Vitamin (MULTIVITAMIN) tablet Take 1 tablet by mouth daily.   Yes [provider]  OVER THE COUNTER MEDICATION Co Q 10 taking once daily   Yes [provider]  PRESCRIPTION MEDICATION Timolol 0.05 % taking 1 drop to each eye  every am   Yes [provider]  timolol (TIMOPTIC) 0.5 % ophthalmic solution place 1 drop into both eyes twice a day (AT 10AM AND 5PM) 02/06/17  Yes Janalyn Higby, PA-C  Travoprost, BAK Free,  (TRAVATAN) 0.004 % SOLN ophthalmic solution Place 1 drop into both eyes at bedtime.   Yes [provider]     Allergies  Allergen Reactions  . Lisinopril Cough  . Metformin And Related     Diarrhea on 1000mg  BID, tolerates 500 mg BID       Objective:  Physical Exam  Constitutional: She is oriented to person, place, and time. She appears well-developed and well-nourished. She is active and cooperative. No distress.  BP 106/67 (BP Location: Right Arm, Patient Position: Sitting, Cuff Size: Large)   Pulse 74   Temp 97.9 F (36.6 C) (Oral)   Resp 18   Ht 5\' 5"  (1.651 m)   Wt 192 lb (87.1 kg)   SpO2 97%   BMI 31.95 kg/m   HENT:  Head: Normocephalic and atraumatic.  Right Ear: Hearing normal.  Left Ear: Hearing normal.  Eyes: Conjunctivae are normal. No scleral icterus.  Neck: Normal range of motion. Neck supple. No thyromegaly present.  Cardiovascular: Normal rate, regular rhythm and normal heart sounds.   Pulses:      Radial pulses are 2+ on the right side, and 2+ on the left side.  Pulmonary/Chest: Effort normal and breath sounds normal.  Lymphadenopathy:       Head (right side): No tonsillar, no preauricular, no posterior auricular and no occipital adenopathy present.       Head (left side): No tonsillar, no preauricular, no posterior auricular and no occipital adenopathy present.    She has no cervical adenopathy.       Right: No supraclavicular adenopathy present.       Left: No supraclavicular adenopathy present.  Neurological: She is alert and oriented to person, place, and time. No sensory deficit.  Skin: Skin is warm, dry and intact. No rash noted. No cyanosis or erythema. Nails show no clubbing.  Psychiatric: She has a normal mood and affect. Her speech is normal and behavior is normal.    Wt Readings from Last 3 Encounters:  02/09/17 192 lb (87.1 kg)  11/17/16 194 lb 3.2 oz (88.1 kg)  08/18/16 196 lb (88.9 kg)       Assessment & Plan:   Problem  List Items Addressed This Visit    Diabetes mellitus type 2, uncomplicated ([place]) - Primary (Chronic)    Appears to have reasonably good control. Await labs. Adjust regimen as indicated by results.       Relevant Orders   CBC with Differential/Platelet (Completed)   Comprehensive metabolic panel (Completed)   Hemoglobin A1c (Completed)   Microalbumin / creatinine urine ratio (Completed)   HM DIABETES FOOT EXAM (Completed)   HM DIABETES EYE EXAM (Completed)   Hyperlipidemia (Chronic)    Has had acceptable control (LDL in the 80's). Await labs. Adjust regimen as indicated by results.       Relevant Orders   Lipid panel (Completed)   Obesity (BMI 30.0-34.9) (Chronic)    Has lost 4 lbs this year. Continue healthy lifestyle changes.       Hypertensive heart disease without CHF (Chronic)    Continue with good control of BP, as well as DM and lipids. Follow up per cardiology recommendations.          Return in about 3 months (around 05/11/2017)  for re-evaluation of diabetes, cholesterol and blood pressure.   Fara Chute, PA-C [facility]

## 2017-02-10 LAB — COMPREHENSIVE METABOLIC PANEL
A/G RATIO: 1.9 (ref 1.2–2.2)
ALBUMIN: 4.4 g/dL (ref 3.5–4.8)
ALK PHOS: 100 IU/L (ref 39–117)
ALT: 11 IU/L (ref 0–32)
AST: 13 IU/L (ref 0–40)
BILIRUBIN TOTAL: 0.4 mg/dL (ref 0.0–1.2)
BUN / CREAT RATIO: 22 (ref 12–28)
BUN: 24 mg/dL (ref 8–27)
CHLORIDE: 100 mmol/L (ref 96–106)
CO2: 24 mmol/L (ref 20–29)
Calcium: 10 mg/dL (ref 8.7–10.3)
Creatinine, Ser: 1.07 mg/dL — ABNORMAL HIGH (ref 0.57–1.00)
GFR calc non Af Amer: 51 mL/min/{1.73_m2} — ABNORMAL LOW (ref >59)
GFR, EST AFRICAN AMERICAN: 58 mL/min/{1.73_m2} — AB (ref >59)
GLOBULIN, TOTAL: 2.3 g/dL (ref 1.5–4.5)
Glucose: 171 mg/dL — ABNORMAL HIGH (ref 65–99)
Potassium: 4.1 mmol/L (ref 3.5–5.2)
Sodium: 141 mmol/L (ref 134–144)
TOTAL PROTEIN: 6.7 g/dL (ref 6.0–8.5)

## 2017-02-10 LAB — CBC WITH DIFFERENTIAL/PLATELET
BASOS ABS: 0.1 10*3/uL (ref 0.0–0.2)
Basos: 1 %
EOS (ABSOLUTE): 0.1 10*3/uL (ref 0.0–0.4)
EOS: 3 %
HEMATOCRIT: 35.9 % (ref 34.0–46.6)
HEMOGLOBIN: 12.3 g/dL (ref 11.1–15.9)
Immature Grans (Abs): 0 10*3/uL (ref 0.0–0.1)
Immature Granulocytes: 0 %
LYMPHS ABS: 1.9 10*3/uL (ref 0.7–3.1)
Lymphs: 45 %
MCH: 29.3 pg (ref 26.6–33.0)
MCHC: 34.3 g/dL (ref 31.5–35.7)
MCV: 86 fL (ref 79–97)
MONOCYTES: 8 %
Monocytes Absolute: 0.3 10*3/uL (ref 0.1–0.9)
Neutrophils Absolute: 1.9 10*3/uL (ref 1.4–7.0)
Neutrophils: 43 %
Platelets: 308 10*3/uL (ref 150–379)
RBC: 4.2 x10E6/uL (ref 3.77–5.28)
RDW: 15.2 % (ref 12.3–15.4)
WBC: 4.3 10*3/uL (ref 3.4–10.8)

## 2017-02-10 LAB — LIPID PANEL
CHOL/HDL RATIO: 2.6 ratio (ref 0.0–4.4)
Cholesterol, Total: 168 mg/dL (ref 100–199)
HDL: 65 mg/dL (ref >39)
LDL CALC: 81 mg/dL (ref 0–99)
Triglycerides: 112 mg/dL (ref 0–149)
VLDL Cholesterol Cal: 22 mg/dL (ref 5–40)

## 2017-02-10 LAB — HEMOGLOBIN A1C
Est. average glucose Bld gHb Est-mCnc: 174 mg/dL
HEMOGLOBIN A1C: 7.7 % — AB (ref 4.8–5.6)

## 2017-02-10 LAB — MICROALBUMIN / CREATININE URINE RATIO
Creatinine, Urine: 131.3 mg/dL
MICROALB/CREAT RATIO: 3.4 mg/g{creat} (ref 0.0–30.0)
Microalbumin, Urine: 4.4 ug/mL

## 2017-02-11 NOTE — Assessment & Plan Note (Signed)
Continue with good control of BP, as well as DM and lipids. Follow up per cardiology recommendations.

## 2017-02-11 NOTE — Assessment & Plan Note (Signed)
Appears to have reasonably good control. Await labs. Adjust regimen as indicated by results.

## 2017-02-11 NOTE — Assessment & Plan Note (Signed)
Has had acceptable control (LDL in the 80's). Await labs. Adjust regimen as indicated by results.

## 2017-02-11 NOTE — Assessment & Plan Note (Signed)
Has lost 4 lbs this year. Continue healthy lifestyle changes.

## 2017-02-14 ENCOUNTER — Other Ambulatory Visit: Payer: Self-pay | Admitting: Cardiology

## 2017-02-23 ENCOUNTER — Ambulatory Visit (INDEPENDENT_AMBULATORY_CARE_PROVIDER_SITE_OTHER): Payer: Medicare Other | Admitting: Cardiology

## 2017-02-23 ENCOUNTER — Encounter: Payer: Self-pay | Admitting: Cardiology

## 2017-02-23 VITALS — BP 116/64 | HR 77 | Ht 65.0 in | Wt 194.8 lb

## 2017-02-23 DIAGNOSIS — R002 Palpitations: Secondary | ICD-10-CM | POA: Diagnosis not present

## 2017-02-23 DIAGNOSIS — E7849 Other hyperlipidemia: Secondary | ICD-10-CM

## 2017-02-23 DIAGNOSIS — I119 Hypertensive heart disease without heart failure: Secondary | ICD-10-CM

## 2017-02-23 DIAGNOSIS — R9439 Abnormal result of other cardiovascular function study: Secondary | ICD-10-CM | POA: Diagnosis not present

## 2017-02-23 DIAGNOSIS — E784 Other hyperlipidemia: Secondary | ICD-10-CM

## 2017-02-23 NOTE — Patient Instructions (Signed)
NO CHANGE WITH CURRENT MEDICATIONS   Your physician wants you to follow-up in 12 MONTHS WITH DR HARDING. You will receive a reminder letter in the mail two months in advance. If you don't receive a letter, please call our office to schedule the follow-up appointment.     If you need a refill on your cardiac medications before your next appointment, please call your pharmacy.  

## 2017-02-23 NOTE — Progress Notes (Signed)
PCP: [name], PA-C  Clinic Note: Chief Complaint  Patient presents with  . Follow-up    See Problem List   PROBLEM LIST:  1. Essential hypertension   2. Hypertensive heart disease without CHF   3. Exertional chest pain   4. Palpitations   5. Hyperlipidemia   6. Abnormal stress electrocardiogram test using treadmill: With Normal Imaging -> imaging confirms lack of significant CAD ; Normal/tortuous coronaries on cath    HPI: Angela Chambers is a 76 y.o. female with a PMH below who presents today for Follow-up for hypertensive heart disease and palpitations as well as hyperlipidemia he was also evaluated for exertional chest pain is cardiac catheterization showing normal coronaries and abnormal GXT.   Angela Chambers was last seen in September 2017. She was doing well.  Recent Hospitalizations: none  Studies Personally Reviewed - (if available, images/films reviewed: From Epic Chart or Care Everywhere)  none  Interval History: Angela Chambers presents here today doing very well without any major complaints. She has not had any issues since her last visit. No chest tightness or pressure with rest or exertion. No resting or exertional dyspnea. She has occasional nocturnal skipped beats that last maybe a few seconds, but usually she takes a deep breath or coughs this.. No prolonged arrhythmias however. She is trying to get active and has not had any lightheadedness, dizziness or wooziness, syncope or near syncope. No TIA/amaurosis fugax symptoms.  She is active, but has been having a hard time losing weight. She says her blood pressures and well-controlled at home.  No claudication.  ROS: A comprehensive was performed. Review of Systems  Constitutional: Negative for malaise/fatigue.  HENT: Negative for congestion, nosebleeds and sore throat.   Respiratory: Negative for shortness of breath.   Cardiovascular:       Per history of present illness    Gastrointestinal: Negative for blood in stool, heartburn and melena.  Genitourinary: Negative for hematuria.  Musculoskeletal: Joint pain: Arthritis.  Neurological: Negative for dizziness.  Endo/Heme/Allergies: Negative for environmental allergies.  Psychiatric/Behavioral: Negative for depression and memory loss. The patient is not nervous/anxious (Much calmer after having her catheterization - significant relief) and does not have insomnia.   All other systems reviewed and are negative.   I have reviewed and (if needed) personally updated the patient's problem list, medications, allergies, past medical and surgical history, social and family history.   Past Medical History:  Diagnosis Date  . Arthritis   . Cataract   . Diabetes mellitus   . Fibroids   . Glaucoma   . Hyperlipidemia   . Hypertension 07 /2013    WITH EF GREATER THAN 55 % WITH MILD MR BUT NO PROLASPE ,ESSENTIALLY NORMAL DONE TO EVALUATE PALPITATIONS .  Marland Kitchen Obesity (BMI 30.0-34.9)   . Osteoporosis   . Palpitations    CONTROLLED ON BETA BLOCKER    Past Surgical History:  Procedure Laterality Date  . ABDOMINAL HYSTERECTOMY  10/18/ 1982   TAH-BSO for fibroids and adenomatous hyperplasia  . CARDIAC CATHETERIZATION  08/2009   NONOBSTRUCTIVE CAD WITH 20% IN THE LAD AND 20 % IN THE RCA NORMAL EF 65%  . CARDIAC CATHETERIZATION N/A 07/05/2015   Procedure: Left Heart Cath and Coronary Angiography;  Surgeon: Leonie Man, MD;  Location: [facility];  Service: Cardiovascular: Angiographically minimal CAD. EF 60-65%  . CPET-MET Test (Cardiopulmonary Exercise Test)  03/06/2013   Adequate effort at 1.11. Peak VO2 12.3 is 86% in the normal  range. Heart rate was just outside the normal range target being 127 beats a minute but this was 86% of projected. She did have an ischemic pattern after the anterograde threshold with increased heart rate and blunting of stroke volume during the last 3 minutes of exercise, but was  asymptomatic.  Marland Kitchen JOINT REPLACEMENT  09/05/2009   L knee  . KNEE SURGERY  1984   per pt screws in R knee  . NM MYOVIEW LTD  06/2015   FALSE + GXT - Images Accurate:  TM - 6 min, 7 METs (93% MPHR) --> chest discomfort, notable ST depressions => Hight Risk Duke TM Score, but NO scintigraphic evidence of ischemia or infarction. EF 74$  . 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 TRANSTHORACIC ECHOCARDIOGRAM  July 2013   EF greater than 55%, mild MR no prolapse.  Normal  . TUBAL LIGATION     Diagnostic Diagram - angiographic normal, extremely tortuous vessels. Consistent with hypertensive heart disease     Current Meds  Medication Sig  . amLODipine (NORVASC) 5 MG tablet take 1 tablet by mouth once daily  . aspirin 81 MG tablet Take 81 mg by mouth daily.  Marland Kitchen atorvastatin (LIPITOR) 20 MG tablet take 1 tablet by mouth once daily  . Co-Enzyme Q-10 30 MG CAPS Take 30 mg by mouth daily.  . hydrochlorothiazide (HYDRODIURIL) 25 MG tablet take 1 tablet by mouth once daily  . losartan (COZAAR) 100 MG tablet take 1 tablet by mouth once daily  . metFORMIN (GLUCOPHAGE) 500 MG tablet take 2 tablets by mouth twice a day with meals  . metoprolol succinate (TOPROL-XL) 50 MG 24 hr tablet take 1 tablet by mouth once daily  . Multiple Vitamin (MULTIVITAMIN) tablet Take 1 tablet by mouth daily.  Marland Kitchen OVER THE COUNTER MEDICATION Co Q 10 taking once daily  . PRESCRIPTION MEDICATION Timolol 0.05 % taking 1 drop to each eye every am  . timolol (TIMOPTIC) 0.5 % ophthalmic solution place 1 drop into both eyes twice a day (AT 10AM AND 5PM)  . Travoprost, BAK Free, (TRAVATAN) 0.004 % SOLN ophthalmic solution Place 1 drop into both eyes at bedtime.    Allergies  Allergen Reactions  . Lisinopril Cough  . Metformin And Related     Diarrhea on 1070m BID, tolerates 500 mg BID     Social History   Social History  . Marital status: Married    Spouse name: N/A  . Number of children: N/A  . Years of education: N/A   Occupational History  . retired    Social History Main Topics  . Smoking status: Former Smoker    Packs/day: 0.50    Years: 20.00    Types: Cigarettes    Quit date: 07/13/1981  . Smokeless tobacco: Never Used  . Alcohol use No  . Drug use: No  . Sexual activity: Yes   Other Topics Concern  . None   Social History Narrative   SHE IS MARRIED ,MOTHER OF 4, GRANDMOTHER OF 6. SHE DOES EXERCISE BUT SOMEWHAT LIMITED BECAUSE OF HER KNEE (S/P SURGERY 2015).   SHE QUIT SMOKING IN 1980 AND [place] NOT [place]   family history includes Diabetes in her brother, mother, and sister; Heart disease in her mother; Hyperlipidemia in her brother, mother, sister, and sister; Hypertension in her sister; Prostate cancer in her  son.  Wt Readings from Last 3 Encounters:  02/23/17 194 lb 12.8 oz (88.4 kg)  02/09/17 192 lb (87.1 kg)  11/17/16 194 lb 3.2 oz (88.1 kg)    PHYSICAL EXAM BP 116/64   Pulse 77   Ht _0  (1.651 m)   Wt 194 lb 12.8 oz (88.4 kg)   LMP  (LMP Unknown)   BMI 32.42 kg/m  Physical Exam  Constitutional: She is oriented to person, place, and time. She appears well-developed and well-nourished. No distress.  Healthy-appearing. Mildly obese. Well groomed  HENT:  Head: Normocephalic and atraumatic.  Eyes: EOM are normal.  Neck: Normal range of motion. Neck supple. No hepatojugular reflux and no JVD present. Carotid bruit is not present. No thyromegaly present.  Cardiovascular: Normal rate, regular rhythm, normal heart sounds and intact distal pulses.   Occasional extrasystoles are present. PMI is not displaced.  Exam reveals no gallop and no friction rub.   No murmur heard. Pulmonary/Chest: Effort normal and breath sounds normal. No respiratory distress. She has no wheezes. She has no rales.  Abdominal: Soft. Bowel sounds are normal.  She exhibits no distension. There is no tenderness. There is no rebound.  Musculoskeletal: Normal range of motion. She exhibits no edema.  Neurological: She is alert and oriented to person, place, and time.  Grossly normal  Skin: Skin is warm and dry. No rash noted. No erythema.  Psychiatric: She has a normal mood and affect. Her behavior is normal. Judgment and thought content normal.  Nursing note and vitals reviewed.    Adult ECG Report  Rate: 72 ;  Rhythm: normal sinus rhythm, premature ventricular contractions (PVC) and Nonspecific ST-T wave changes otherwise normal axis, intervals and durations; ;   Narrative Interpretation: Stable EKG   Other studies Reviewed: Additional studies/ records that were reviewed today include:  Recent Labs:   Lab Results  Component Value Date   CHOL 168 02/09/2017   HDL 65 02/09/2017   LDLCALC 81 02/09/2017   TRIG 112 02/09/2017   CHOLHDL 2.6 02/09/2017    ASSESSMENT / PLAN: Problem List Items Addressed This Visit    Abnormal stress echo EKG portion with normal echo -> imaging confirmed the correct with normal coronaries (Chronic)    Would do a Lexiscan Myoview if necessary for future evaluation.      Hyperlipidemia (Chronic)    Acceptable levels of LDL and total cholesterol/HDL by recent check. Continue current regimen. Goal LDL should be between 70-100 with her diabetes, hypertension and hyperlipidemia.      Hypertensive heart disease without CHF - Primary (Chronic)    Very tortuous but angiographically normal coronary arteries on heart catheterization. Borderline LVH on echo, however based on the coronary anatomy I would suggest that she does have diastolic dysfunction. No real diastolic heart failure. Blood pressure well controlled on current dose of Toprol, losartan along with HCTZ and amlodipine. No edema. No changes.      Relevant Orders   EKG 12-Lead   Palpitations (Chronic)    She does have some PVCs on EKG which is probably  what she is feeling minimal and nitrate. However otherwise very asymptomatic on current dose of Toprol. No change.      Relevant Orders   EKG 12-Lead      Current medicines are reviewed at length with the patient today. (+/- concerns) n/a The following changes have been made: n/a  Patient Instructions  NO CHANGE WITH CURRENT MEDICATIONS   Your physician wants you to follow-up  in [place]. You will receive a reminder letter in the mail two months in advance. If you don't receive a letter, please call our office to schedule the follow-up appointment.   If you need a refill on your cardiac medications before your next appointment, please call your pharmacy.    Studies Ordered:   Orders Placed This Encounter  Procedures  . EKG 12-Lead      Glenetta Hew, M.D., M.S. Interventional Cardiologist   Pager # [phone] Phone # [phone] [address]. [place], [place] [address]

## 2017-02-24 ENCOUNTER — Encounter: Payer: Self-pay | Admitting: Cardiology

## 2017-02-24 NOTE — Assessment & Plan Note (Signed)
Very tortuous but angiographically normal coronary arteries on heart catheterization. Borderline LVH on echo, however based on the coronary anatomy I would suggest that she does have diastolic dysfunction. No real diastolic heart failure. Blood pressure well controlled on current dose of Toprol, losartan along with HCTZ and amlodipine. No edema. No changes.

## 2017-02-24 NOTE — Assessment & Plan Note (Signed)
She does have some PVCs on EKG which is probably what she is feeling minimal and nitrate. However otherwise very asymptomatic on current dose of Toprol. No change.

## 2017-02-24 NOTE — Assessment & Plan Note (Signed)
Acceptable levels of LDL and total cholesterol/HDL by recent check. Continue current regimen. Goal LDL should be between 70-100 with her diabetes, hypertension and hyperlipidemia.

## 2017-02-24 NOTE — Assessment & Plan Note (Signed)
Would do a Lexiscan Myoview if necessary for future evaluation.

## 2017-03-15 ENCOUNTER — Other Ambulatory Visit: Payer: Self-pay | Admitting: Physician Assistant

## 2017-03-15 DIAGNOSIS — I10 Essential (primary) hypertension: Secondary | ICD-10-CM

## 2017-05-11 ENCOUNTER — Ambulatory Visit: Payer: Medicare Other | Admitting: Physician Assistant

## 2017-05-12 ENCOUNTER — Encounter: Payer: Self-pay | Admitting: Physician Assistant

## 2017-05-12 ENCOUNTER — Other Ambulatory Visit: Payer: Self-pay

## 2017-05-12 ENCOUNTER — Ambulatory Visit: Payer: Medicare Other | Admitting: Physician Assistant

## 2017-05-12 VITALS — BP 118/74 | HR 83 | Temp 97.8°F | Resp 18 | Ht 65.0 in | Wt 189.0 lb

## 2017-05-12 DIAGNOSIS — I10 Essential (primary) hypertension: Secondary | ICD-10-CM

## 2017-05-12 DIAGNOSIS — E119 Type 2 diabetes mellitus without complications: Secondary | ICD-10-CM | POA: Diagnosis not present

## 2017-05-12 DIAGNOSIS — E7849 Other hyperlipidemia: Secondary | ICD-10-CM

## 2017-05-12 DIAGNOSIS — I119 Hypertensive heart disease without heart failure: Secondary | ICD-10-CM | POA: Diagnosis not present

## 2017-05-12 MED ORDER — AMLODIPINE BESYLATE 5 MG PO TABS: 5.0000 mg | ORAL_TABLET | Freq: Every day | ORAL | 3 refills | Status: DC

## 2017-05-12 MED ORDER — LOSARTAN POTASSIUM 100 MG PO TABS: 100.0000 mg | ORAL_TABLET | Freq: Every day | ORAL | 3 refills | Status: DC

## 2017-05-12 NOTE — Patient Instructions (Signed)
     IF you received an x-ray today, you will receive an invoice from [facility]. Please contact [facility] at [phone] with questions or concerns regarding your invoice.   IF you received labwork today, you will receive an invoice from [facility]. Please contact [facility] at [phone] with questions or concerns regarding your invoice.   Our billing staff will not be able to assist you with questions regarding bills from these companies.  You will be contacted with the lab results as soon as they are available. The fastest way to get your results is to activate your My Chart account. Instructions are located on the last page of this paperwork. If you have not heard from us regarding the results in 2 weeks, please contact this office.     

## 2017-05-12 NOTE — Progress Notes (Signed)
Patient ID: Angela Chambers, female    DOB: Jan 19, 1941, 76 y.o.   MRN: [id number]  PCP: [name], PA-C  Chief Complaint  Patient presents with  . Hypertension  . Diabetes  . Hyperlipidemia  . Follow-up    Subjective:   Presents for evaluation of diabetes, HTN, hyperlipidemia.  Home glucose "prety good," but hasn't tested recently. No problems to report. Tolerating her medications without difficulty.  A1C 7.7% 02/09/2017 LDL 81 02/09/2017  Review of Systems  Constitutional: Negative for activity change, appetite change, fatigue and unexpected weight change.  HENT: Negative for congestion, dental problem, ear pain, hearing loss, mouth sores, postnasal drip, rhinorrhea, sneezing, sore throat, tinnitus and trouble swallowing.   Eyes: Negative for photophobia, pain, redness and visual disturbance.  Respiratory: Negative for cough, chest tightness and shortness of breath.   Cardiovascular: Negative for chest pain, palpitations and leg swelling.  Gastrointestinal: Negative for abdominal pain, blood in stool, constipation, diarrhea, nausea and vomiting.  Endocrine: Negative for cold intolerance, heat intolerance, polydipsia, polyphagia and polyuria.  Genitourinary: Negative for dysuria, frequency, hematuria and urgency.  Musculoskeletal: Negative for arthralgias, gait problem, myalgias and neck stiffness.  Skin: Negative for rash.  Neurological: Negative for dizziness, speech difficulty, weakness, light-headedness, numbness and headaches.  Hematological: Negative for adenopathy.  Psychiatric/Behavioral: Negative for confusion and sleep disturbance. The patient is not nervous/anxious.        Patient Active Problem List   Diagnosis Date Noted  . Abnormal stress echo EKG portion with normal echo -> imaging confirmed the correct with normal coronaries 06/21/2015  . Exertional chest pain 06/07/2015  . OA (osteoarthritis) of knee 09/25/2013  . Obesity (BMI 30.0-34.9)    . Palpitations   . Hypertensive heart disease without CHF 11/30/2011  . Hyperlipidemia 08/25/2011  . Other and combined forms of senile cataract 07/04/2011  . Primary open angle glaucoma of both eyes, severe stage 07/04/2011  . Diabetes mellitus type 2, uncomplicated ([place]) 44/96/7591  . PERSONAL HISTORY OF COLONIC POLYPS 02/22/2009     Prior to Admission medications   Medication Sig Start Date End Date Taking? Authorizing Provider  amLODipine (NORVASC) 5 MG tablet take 1 tablet by mouth once daily 03/18/17  Yes Bianna Haran, PA-C  aspirin 81 MG tablet Take 81 mg by mouth daily.   Yes [provider]  atorvastatin (LIPITOR) 20 MG tablet take 1 tablet by mouth once daily 12/03/16  Yes Eurydice Calixto, PA-C  Co-Enzyme Q-10 30 MG CAPS Take 30 mg by mouth daily.   Yes [provider]  hydrochlorothiazide (HYDRODIURIL) 25 MG tablet take 1 tablet by mouth once daily 07/10/16  Yes Leonie Man, MD  losartan (COZAAR) 100 MG tablet take 1 tablet by mouth once daily 03/18/17  Yes Desiraye Rolfson, PA-C  metFORMIN (GLUCOPHAGE) 500 MG tablet take 2 tablets by mouth twice a day with meals 10/20/16  Yes Julen Rubert, PA-C  metoprolol succinate (TOPROL-XL) 50 MG 24 hr tablet take 1 tablet by mouth once daily 02/15/17  Yes Leonie Man, MD  Multiple Vitamin (MULTIVITAMIN) tablet Take 1 tablet by mouth daily.   Yes [provider]  OVER THE COUNTER MEDICATION Co Q 10 taking once daily   Yes [provider]  PRESCRIPTION MEDICATION Timolol 0.05 % taking 1 drop to each eye every am   Yes [provider]  timolol (TIMOPTIC) 0.5 % ophthalmic solution place 1 drop into both eyes twice a day (AT [place]) 02/06/17  Yes Jacqulynn Cadet,  Santresa Levett, PA-C  Travoprost, BAK Free, (TRAVATAN) 0.004 % SOLN ophthalmic solution Place 1 drop into both eyes at bedtime.   Yes [provider]     Allergies  Allergen Reactions  . Lisinopril Cough  . Metformin And Related      Diarrhea on 1000mg  BID, tolerates 500 mg BID       Objective:  Physical Exam  Constitutional: She is oriented to person, place, and time. She appears well-developed and well-nourished. She is active and cooperative. No distress.  BP 118/74 (BP Location: Left Arm, Patient Position: Sitting, Cuff Size: Normal)   Pulse 83   Temp 97.8 F (36.6 C) (Oral)   Resp 18   Ht 5\' 5"  (1.651 m)   Wt 189 lb (85.7 kg)   LMP  (LMP Unknown)   SpO2 99%   BMI 31.45 kg/m   HENT:  Head: Normocephalic and atraumatic.  Right Ear: Hearing normal.  Left Ear: Hearing normal.  Eyes: Conjunctivae are normal. No scleral icterus.  Neck: Normal range of motion. Neck supple. No thyromegaly present.  Cardiovascular: Normal rate, regular rhythm and normal heart sounds.  Pulses:      Radial pulses are 2+ on the right side, and 2+ on the left side.  Pulmonary/Chest: Effort normal and breath sounds normal.  Lymphadenopathy:       Head (right side): No tonsillar, no preauricular, no posterior auricular and no occipital adenopathy present.       Head (left side): No tonsillar, no preauricular, no posterior auricular and no occipital adenopathy present.    She has no cervical adenopathy.       Right: No supraclavicular adenopathy present.       Left: No supraclavicular adenopathy present.  Neurological: She is alert and oriented to person, place, and time. No sensory deficit.  Skin: Skin is warm, dry and intact. No rash noted. No cyanosis or erythema. Nails show no clubbing.  Psychiatric: She has a normal mood and affect. Her speech is normal and behavior is normal.           Assessment & Plan:   Problem List Items Addressed This Visit    Diabetes mellitus type 2, uncomplicated ([place]) - Primary (Chronic)    Would like to see A1C <7%. Consider additional agent, pending today's results.      Relevant Medications   losartan (COZAAR) 100 MG tablet   Other Relevant Orders   Comprehensive metabolic panel  (Completed)   Hemoglobin A1c (Completed)   Hyperlipidemia (Chronic)    Last labs, near goal LDL. Consider increased dose of atorvastatin if LDL>70.      Relevant Medications   losartan (COZAAR) 100 MG tablet   amLODipine (NORVASC) 5 MG tablet   Other Relevant Orders   Comprehensive metabolic panel (Completed)   Lipid panel (Completed)   Hypertensive heart disease without CHF (Chronic)    BP well controlled. No changes today.      Relevant Medications   losartan (COZAAR) 100 MG tablet   amLODipine (NORVASC) 5 MG tablet    Other Visit Diagnoses    Essential hypertension       Relevant Medications   losartan (COZAAR) 100 MG tablet   amLODipine (NORVASC) 5 MG tablet       Return in about 3 months (around 08/10/2017) for re-evalaution of diabetes, blood pressure, cholesterol.   Fara Chute, PA-C [facility]

## 2017-05-13 LAB — LIPID PANEL
Chol/HDL Ratio: 2.7 ratio (ref 0.0–4.4)
Cholesterol, Total: 176 mg/dL (ref 100–199)
HDL: 66 mg/dL (ref >39)
LDL CALC: 78 mg/dL (ref 0–99)
Triglycerides: 158 mg/dL — ABNORMAL HIGH (ref 0–149)
VLDL CHOLESTEROL CAL: 32 mg/dL (ref 5–40)

## 2017-05-13 LAB — COMPREHENSIVE METABOLIC PANEL
ALBUMIN: 4 g/dL (ref 3.5–4.8)
ALT: 11 IU/L (ref 0–32)
AST: 13 IU/L (ref 0–40)
Albumin/Globulin Ratio: 1.4 (ref 1.2–2.2)
Alkaline Phosphatase: 86 IU/L (ref 39–117)
BUN / CREAT RATIO: 19 (ref 12–28)
BUN: 17 mg/dL (ref 8–27)
Bilirubin Total: 0.5 mg/dL (ref 0.0–1.2)
CALCIUM: 9.5 mg/dL (ref 8.7–10.3)
CO2: 26 mmol/L (ref 20–29)
CREATININE: 0.88 mg/dL (ref 0.57–1.00)
Chloride: 100 mmol/L (ref 96–106)
GFR calc non Af Amer: 64 mL/min/{1.73_m2} (ref >59)
GFR, EST AFRICAN AMERICAN: 74 mL/min/{1.73_m2} (ref >59)
GLUCOSE: 181 mg/dL — AB (ref 65–99)
Globulin, Total: 2.9 g/dL (ref 1.5–4.5)
Potassium: 3.9 mmol/L (ref 3.5–5.2)
Sodium: 141 mmol/L (ref 134–144)
TOTAL PROTEIN: 6.9 g/dL (ref 6.0–8.5)

## 2017-05-13 LAB — HEMOGLOBIN A1C
ESTIMATED AVERAGE GLUCOSE: 171 mg/dL
HEMOGLOBIN A1C: 7.6 % — AB (ref 4.8–5.6)

## 2017-05-13 NOTE — Assessment & Plan Note (Signed)
BP well controlled. No changes today.

## 2017-05-13 NOTE — Assessment & Plan Note (Addendum)
Would like to see A1C <7%. Consider additional agent, pending today's results.

## 2017-05-13 NOTE — Assessment & Plan Note (Signed)
Last labs, near goal LDL. Consider increased dose of atorvastatin if LDL>70.

## 2017-05-14 ENCOUNTER — Other Ambulatory Visit: Payer: Self-pay | Admitting: Physician Assistant

## 2017-06-23 DIAGNOSIS — H401133 Primary open-angle glaucoma, bilateral, severe stage: Secondary | ICD-10-CM | POA: Diagnosis not present

## 2017-06-30 ENCOUNTER — Other Ambulatory Visit: Payer: Self-pay | Admitting: Cardiology

## 2017-07-04 ENCOUNTER — Other Ambulatory Visit: Payer: Self-pay | Admitting: Physician Assistant

## 2017-07-04 DIAGNOSIS — E119 Type 2 diabetes mellitus without complications: Secondary | ICD-10-CM

## 2017-08-09 ENCOUNTER — Ambulatory Visit: Payer: Medicare Other | Admitting: Physician Assistant

## 2017-08-09 ENCOUNTER — Encounter: Payer: Self-pay | Admitting: Physician Assistant

## 2017-08-09 ENCOUNTER — Other Ambulatory Visit: Payer: Self-pay

## 2017-08-09 VITALS — BP 128/64 | HR 86 | Temp 98.1°F | Resp 18 | Ht 65.04 in | Wt 185.8 lb

## 2017-08-09 DIAGNOSIS — J069 Acute upper respiratory infection, unspecified: Secondary | ICD-10-CM

## 2017-08-09 DIAGNOSIS — R05 Cough: Secondary | ICD-10-CM | POA: Diagnosis not present

## 2017-08-09 DIAGNOSIS — R059 Cough, unspecified: Secondary | ICD-10-CM

## 2017-08-09 DIAGNOSIS — R0989 Other specified symptoms and signs involving the circulatory and respiratory systems: Secondary | ICD-10-CM | POA: Diagnosis not present

## 2017-08-09 MED ORDER — BENZONATATE 100 MG PO CAPS: 100.0000 mg | ORAL_CAPSULE | Freq: Three times a day (TID) | ORAL | 0 refills | Status: DC | PRN

## 2017-08-09 NOTE — Patient Instructions (Signed)
I recommend continuing with Mucinex and starting Coricidin nighttime cough syrup for help at nighttime.  You may also use Tessalon Perles if you want to stop your cough throughout the day and before bedtime.  If any of your symptoms worsen or you develop new concerning symptoms, please return the office.  If no improvement in 5-7 days, please return the office for further evaluation.    Upper Respiratory Infection, Adult Most upper respiratory infections (URIs) are caused by a virus. A URI affects the nose, throat, and upper air passages. The most common type of URI is often called "the common cold." Follow these instructions at home:  Take medicines only as told by your doctor.  Gargle warm saltwater or take cough drops to comfort your throat as told by your doctor.  Use a warm mist humidifier or inhale steam from a shower to increase air moisture. This may make it easier to breathe.  Drink enough fluid to keep your pee (urine) clear or pale yellow.  Eat soups and other clear broths.  Have a healthy diet.  Rest as needed.  Go back to work when your fever is gone or your doctor says it is okay. ? You may need to stay home longer to avoid giving your URI to others. ? You can also wear a face mask and wash your hands often to prevent spread of the virus.  Use your inhaler more if you have asthma.  Do not use any tobacco products, including cigarettes, chewing tobacco, or electronic cigarettes. If you need help quitting, ask your doctor. Contact a doctor if:  You are getting worse, not better.  Your symptoms are not helped by medicine.  You have chills.  You are getting more short of breath.  You have brown or red mucus.  You have yellow or brown discharge from your nose.  You have pain in your face, especially when you bend forward.  You have a fever.  You have puffy (swollen) neck glands.  You have pain while swallowing.  You have white areas in the back of your  throat. Get help right away if:  You have very bad or constant: ? Headache. ? Ear pain. ? Pain in your forehead, behind your eyes, and over your cheekbones (sinus pain). ? Chest pain.  You have long-lasting (chronic) lung disease and any of the following: ? Wheezing. ? Long-lasting cough. ? Coughing up blood. ? A change in your usual mucus.  You have a stiff neck.  You have changes in your: ? Vision. ? Hearing. ? Thinking. ? Mood. This information is not intended to replace advice given to you by your health care provider. Make sure you discuss any questions you have with your health care provider. Document Released: 11/04/2007 Document Revised: 01/19/2016 Document Reviewed: 08/23/2013 Elsevier Interactive Patient Education  2018 [employer].

## 2017-08-09 NOTE — Progress Notes (Signed)
MRN: [id number] DOB: 1941-02-10  Subjective:   Angela Chambers is a 77 y.o. female presenting for chief complaint of Cough (X 2 days ) .  Reports 2 day history of sneezing, runny nose, nasal congestion, chest congestion, productive cough, and sinus congestion. Sore throat and sinus pain have resolved. Cough is improving.  Has tried mucinex and coricidin with relief. Denies fever, ear pain, wheezing, shortness of breath and chest pain, chills, nausea, vomiting, abdominal pain and diarrhea. Has had sick contact with husband. No history of seasonal allergies, asthma, and COPD. Patient has had flu shot this season. Former smoker, quit in 1983.  Denies any other aggravating or relieving factors, no other questions or concerns.  [place] has a current medication list which includes the following prescription(s): amlodipine, aspirin, atorvastatin, co-enzyme q-10, hydrochlorothiazide, losartan, metformin, metoprolol succinate, multivitamin, OVER THE COUNTER MEDICATION, PRESCRIPTION MEDICATION, timolol, travoprost (bak free), and metformin. Also is allergic to lisinopril and metformin and related.  Angela Chambers  has a past medical history of Arthritis, Cataract, Diabetes mellitus, Fibroids, Glaucoma, Hyperlipidemia, Hypertension (07 /2013 ), Obesity (BMI 30.0-34.9), Osteoporosis, and Palpitations. 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   Objective:   Vitals: BP 128/64 (BP Location: Left Arm, Patient Position: Sitting, Cuff Size: Normal)   Pulse 86   Temp 98.1 F (36.7 C) (Oral)   Resp 18   Ht 5' 5.04" (1.652 m)   Wt 185 lb 12.8 oz (84.3  kg)   LMP  (LMP Unknown)   SpO2 98%   BMI 30.88 kg/m   Physical Exam  Constitutional: She is oriented to person, place, and time. She appears well-developed and well-nourished. No distress.  HENT:  Head: Normocephalic and atraumatic.  Right Ear: Tympanic membrane, external ear and ear canal normal.  Left Ear: Tympanic membrane, external ear and ear canal normal.  Nose: Mucosal edema (mild) present. Right sinus exhibits no maxillary sinus tenderness and no frontal sinus tenderness. Left sinus exhibits no maxillary sinus tenderness and no frontal sinus tenderness.  Mouth/Throat: Uvula is midline, oropharynx is clear and moist and mucous membranes are normal. No tonsillar exudate.  Eyes: Conjunctivae are normal.  Neck: Normal range of motion.  Cardiovascular: Normal rate, regular rhythm and normal heart sounds.  Pulmonary/Chest: Effort normal and breath sounds normal. She has no decreased breath sounds. She has no wheezes. She has no rhonchi. She has no rales.  Lymphadenopathy:       Head (right side): No submental, no submandibular, no tonsillar, no preauricular, no posterior auricular and no occipital adenopathy present.       Head (left side): No submental, no submandibular, no tonsillar, no preauricular, no posterior auricular and no occipital adenopathy present.    She has no cervical adenopathy.       Right: No supraclavicular adenopathy present.       Left: No supraclavicular adenopathy present.  Neurological: She is alert and oriented to person, place, and time.  Skin: Skin is warm and dry.  Psychiatric: She has a normal mood and affect.  Vitals reviewed.   No results found for this or any previous visit (from the past 24 hour(s)).  Assessment and Plan :  1. Cough - benzonatate (TESSALON) 100 MG capsule; Take 1-2 capsules (100-200 mg  total) by mouth 3 (three) times daily as needed for cough.  Dispense: 40 capsule; Refill: 0 2. Runny nose 3. Acute upper respiratory  infection History and physical exam findings consistent with acute URI.  Likely viral etiology.  Patient is overall well-appearing, no distress.  Vitals stable.  Lungs CTAB.   Reassuring that symptoms are improving.  Recommended continuing with symptomatic treatment.  Advised to return to clinic if symptoms worsen, do not improve in 5-7 days, or as needed.   Tenna Delaine, PA-C  [facility] at [facility] 08/09/2017 12:05 PM

## 2017-08-10 ENCOUNTER — Ambulatory Visit: Payer: Medicare Other | Admitting: Physician Assistant

## 2017-08-17 ENCOUNTER — Ambulatory Visit: Payer: Medicare Other | Admitting: Physician Assistant

## 2017-08-24 ENCOUNTER — Ambulatory Visit (INDEPENDENT_AMBULATORY_CARE_PROVIDER_SITE_OTHER): Payer: Medicare Other | Admitting: Physician Assistant

## 2017-08-24 ENCOUNTER — Ambulatory Visit (INDEPENDENT_AMBULATORY_CARE_PROVIDER_SITE_OTHER): Payer: Medicare Other

## 2017-08-24 ENCOUNTER — Other Ambulatory Visit: Payer: Self-pay

## 2017-08-24 ENCOUNTER — Encounter: Payer: Self-pay | Admitting: Physician Assistant

## 2017-08-24 VITALS — BP 120/70 | HR 84 | Temp 98.5°F | Resp 16 | Ht 65.0 in | Wt 186.0 lb

## 2017-08-24 VITALS — BP 120/70 | HR 84 | Ht 65.0 in | Wt 186.0 lb

## 2017-08-24 DIAGNOSIS — E119 Type 2 diabetes mellitus without complications: Secondary | ICD-10-CM | POA: Diagnosis not present

## 2017-08-24 DIAGNOSIS — E669 Obesity, unspecified: Secondary | ICD-10-CM

## 2017-08-24 DIAGNOSIS — E7849 Other hyperlipidemia: Secondary | ICD-10-CM | POA: Diagnosis not present

## 2017-08-24 DIAGNOSIS — I119 Hypertensive heart disease without heart failure: Secondary | ICD-10-CM | POA: Diagnosis not present

## 2017-08-24 DIAGNOSIS — Z0000 Encounter for general adult medical examination without abnormal findings: Secondary | ICD-10-CM

## 2017-08-24 MED ORDER — ATORVASTATIN CALCIUM 20 MG PO TABS: 20.0000 mg | ORAL_TABLET | Freq: Every day | ORAL | 3 refills | Status: DC

## 2017-08-24 NOTE — Patient Instructions (Addendum)
You are PERFECT.  Schedule a follow-up with Dr. Ellyn Hack.    IF you received an x-ray today, you will receive an invoice from [facility]. Please contact [facility] at [phone] with questions or concerns regarding your invoice.   IF you received labwork today, you will receive an invoice from [facility]. Please contact [facility] at [phone] with questions or concerns regarding your invoice.   Our billing staff will not be able to assist you with questions regarding bills from these companies.  You will be contacted with the lab results as soon as they are available. The fastest way to get your results is to activate your My Chart account. Instructions are located on the last page of this paperwork. If you have not heard from [place] regarding the results in 2 weeks, please contact this office.

## 2017-08-24 NOTE — Patient Instructions (Signed)
Angela Chambers , Thank you for taking time to come for your Medicare Wellness Visit. I appreciate your ongoing commitment to your health goals. Please review the following plan we discussed and let me know if I can assist you in the future.   Screening recommendations/referrals: Colonoscopy: no longer required Mammogram: no longer required Bone Density: up to date, next due 05/05/2018 Recommended yearly ophthalmology/optometry visit for glaucoma screening and checkup Recommended yearly dental visit for hygiene and checkup  Vaccinations: Influenza vaccine: up to date Pneumococcal vaccine: up to date Tdap vaccine: up to date, next due 04/28/2026 Shingles vaccine: up to date    Advanced directives: Please bring a copy of your POA (Power of [place]) and/or Living Will to your next appointment.   Conditions/risks identified: Try to lose weight and get to around 170 lbs in the future.   Next appointment: today @ 2:40 pm with Angela Chambers, Next AWV in 1 year    Preventive Care 75 Years and Older, Female Preventive care refers to lifestyle choices and visits with your health care provider that can promote health and wellness. What does preventive care include?  A yearly physical exam. This is also called an annual well check.  Dental exams once or twice a year.  Routine eye exams. Ask your health care provider how often you should have your eyes checked.  Personal lifestyle choices, including:  Daily care of your teeth and gums.  Regular physical activity.  Eating a healthy diet.  Avoiding tobacco and drug use.  Limiting alcohol use.  Practicing safe sex.  Taking low-dose aspirin every day.  Taking vitamin and mineral supplements as recommended by your health care provider. What happens during an annual well check? The services and screenings done by your health care provider during your annual well check will depend on your age, overall health, lifestyle risk factors,  and family history of disease. Counseling  Your health care provider may ask you questions about your:  Alcohol use.  Tobacco use.  Drug use.  Emotional well-being.  Home and relationship well-being.  Sexual activity.  Eating habits.  History of falls.  Memory and ability to understand (cognition).  Work and work Statistician.  Reproductive health. Screening  You may have the following tests or measurements:  Height, weight, and BMI.  Blood pressure.  Lipid and cholesterol levels. These may be checked every 5 years, or more frequently if you are over 86 years old.  Skin check.  Lung cancer screening. You may have this screening every year starting at age 67 if you have a 30-pack-year history of smoking and currently smoke or have quit within the past 15 years.  Fecal occult blood test (FOBT) of the stool. You may have this test every year starting at age 4.  Flexible sigmoidoscopy or colonoscopy. You may have a sigmoidoscopy every 5 years or a colonoscopy every 10 years starting at age 69.  Hepatitis C blood test.  Hepatitis B blood test.  Sexually transmitted disease (STD) testing.  Diabetes screening. This is done by checking your blood sugar (glucose) after you have not eaten for a while (fasting). You may have this done every 1-3 years.  Bone density scan. This is done to screen for osteoporosis. You may have this done starting at age 46.  Mammogram. This may be done every 1-2 years. Talk to your health care provider about how often you should have regular mammograms. Talk with your health care provider about your test results, treatment options, and  if necessary, the need for more tests. Vaccines  Your health care provider may recommend certain vaccines, such as:  Influenza vaccine. This is recommended every year.  Tetanus, diphtheria, and acellular pertussis (Tdap, Td) vaccine. You may need a Td booster every 10 years.  Zoster vaccine. You may need  this after age 95.  Pneumococcal 13-valent conjugate (PCV13) vaccine. One dose is recommended after age 36.  Pneumococcal polysaccharide (PPSV23) vaccine. One dose is recommended after age 66. Talk to your health care provider about which screenings and vaccines you need and how often you need them. This information is not intended to replace advice given to you by your health care provider. Make sure you discuss any questions you have with your health care provider. Document Released: 06/14/2015 Document Revised: 02/05/2016 Document Reviewed: 03/19/2015 Elsevier Interactive Patient Education  2017 [place] Prevention in the Home Falls can cause injuries. They can happen to people of all ages. There are many things you can do to make your home safe and to help prevent falls. What can I do on the outside of my home?  Regularly fix the edges of walkways and driveways and fix any cracks.  Remove anything that might make you trip as you walk through a door, such as a raised step or threshold.  Trim any bushes or trees on the path to your home.  Use bright outdoor lighting.  Clear any walking paths of anything that might make someone trip, such as rocks or tools.  Regularly check to see if handrails are loose or broken. Make sure that both sides of any steps have handrails.  Any raised decks and porches should have guardrails on the edges.  Have any leaves, snow, or ice cleared regularly.  Use sand or salt on walking paths during winter.  Clean up any spills in your garage right away. This includes oil or grease spills. What can I do in the bathroom?  Use night lights.  Install grab bars by the toilet and in the tub and shower. Do not use towel bars as grab bars.  Use non-skid mats or decals in the tub or shower.  If you need to sit down in the shower, use a plastic, non-slip stool.  Keep the floor dry. Clean up any water that spills on the floor as soon as it  happens.  Remove soap buildup in the tub or shower regularly.  Attach bath mats securely with double-sided non-slip rug tape.  Do not have throw rugs and other things on the floor that can make you trip. What can I do in the bedroom?  Use night lights.  Make sure that you have a light by your bed that is easy to reach.  Do not use any sheets or blankets that are too big for your bed. They should not hang down onto the floor.  Have a firm chair that has side arms. You can use this for support while you get dressed.  Do not have throw rugs and other things on the floor that can make you trip. What can I do in the kitchen?  Clean up any spills right away.  Avoid walking on wet floors.  Keep items that you use a lot in easy-to-reach places.  If you need to reach something above you, use a strong step stool that has a grab bar.  Keep electrical cords out of the way.  Do not use floor polish or wax that makes floors slippery. If you  must use wax, use non-skid floor wax.  Do not have throw rugs and other things on the floor that can make you trip. What can I do with my stairs?  Do not leave any items on the stairs.  Make sure that there are handrails on both sides of the stairs and use them. Fix handrails that are broken or loose. Make sure that handrails are as long as the stairways.  Check any carpeting to make sure that it is firmly attached to the stairs. Fix any carpet that is loose or worn.  Avoid having throw rugs at the top or bottom of the stairs. If you do have throw rugs, attach them to the floor with carpet tape.  Make sure that you have a light switch at the top of the stairs and the bottom of the stairs. If you do not have them, ask someone to add them for you. What else can I do to help prevent falls?  Wear shoes that:  Do not have high heels.  Have rubber bottoms.  Are comfortable and fit you well.  Are closed at the toe. Do not wear sandals.  If you  use a stepladder:  Make sure that it is fully opened. Do not climb a closed stepladder.  Make sure that both sides of the stepladder are locked into place.  Ask someone to hold it for you, if possible.  Clearly mark and make sure that you can see:  Any grab bars or handrails.  First and last steps.  Where the edge of each step is.  Use tools that help you move around (mobility aids) if they are needed. These include:  Canes.  Walkers.  Scooters.  Crutches.  Turn on the lights when you go into a dark area. Replace any light bulbs as soon as they burn out.  Set up your furniture so you have a clear path. Avoid moving your furniture around.  If any of your floors are uneven, fix them.  If there are any pets around you, be aware of where they are.  Review your medicines with your doctor. Some medicines can make you feel dizzy. This can increase your chance of falling. Ask your doctor what other things that you can do to help prevent falls. This information is not intended to replace advice given to you by your health care provider. Make sure you discuss any questions you have with your health care provider. Document Released: 03/14/2009 Document Revised: 10/24/2015 Document Reviewed: 06/22/2014 Elsevier Interactive Patient Education  2017 [employer].

## 2017-08-24 NOTE — Progress Notes (Signed)
Subjective:   Angela Chambers is a 77 y.o. female who presents for Medicare Annual (Subsequent) preventive examination.  Review of Systems:  N/A Cardiac Risk Factors include: advanced age (>47mn, >>33women);diabetes mellitus;dyslipidemia;obesity (BMI >30kg/m2)     Objective:     Vitals: BP 120/70   Pulse 84   Ht 5' 5"  (1.651 m)   Wt 186 lb (84.4 kg)   LMP  (LMP Unknown)   SpO2 98%   BMI 30.95 kg/m   Body mass index is 30.95 kg/m.  Advanced Directives 08/24/2017 08/09/2017 03/17/2016 07/05/2015 09/25/2013 09/19/2013  Does Patient Have a Medical Advance Directive? Yes Yes Yes Yes Patient has advance directive, copy not in chart Patient has advance directive, copy not in chart  Type of Advance Directive [place] will Living will Living will [place] will  Does patient want to make changes to medical advance directive? - No - Patient declined - No - Patient declined No change requested No change requested  Copy of H[place] Chart? No - copy requested No - copy requested No - copy requested No - copy requested Copy requested from family Copy requested from family  Pre-existing out of facility DNR order (yellow form or pink MOST form) - - - - No No    Tobacco Social History   Tobacco Use  Smoking Status Former Smoker  . Packs/day: 0.50  . Years: 20.00  . Pack years: 10.00  . Types: Cigarettes  . Last attempt to quit: 07/13/1981  . Years since quitting: 36.1  Smokeless Tobacco Never Used     Counseling given: Not Answered   Clinical Intake:  Pre-visit preparation completed: Yes  Pain : No/denies pain     Nutritional Status: BMI > 30  Obese Nutritional Risks: None Diabetes: Yes CBG done?: No Did pt. bring in CBG monitor from home?: No  How often do you need to have someone help you when you read  instructions, pamphlets, or other written materials from your doctor or pharmacy?: 1 - Never What is the last grade level you completed in school?: some college  Interpreter Needed?: No  Information entered by :: CAndrez Grime LPN  Past Medical History:  Diagnosis Date  . Arthritis   . Cataract   . Diabetes mellitus   . Fibroids   . Glaucoma   . Hyperlipidemia   . Hypertension 07 /2013    WITH EF GREATER THAN 55 % WITH MILD MR BUT NO PROLASPE ,ESSENTIALLY NORMAL DONE TO EVALUATE PALPITATIONS .  .Marland KitchenObesity (BMI 30.0-34.9)   . Osteoporosis   . Palpitations    CONTROLLED ON BETA BLOCKER   Past Surgical History:  Procedure Laterality Date  . ABDOMINAL HYSTERECTOMY  10/18/ 1982   TAH-BSO for fibroids and adenomatous hyperplasia  . CARDIAC CATHETERIZATION  08/2009   NONOBSTRUCTIVE CAD WITH 20% IN THE LAD AND 20 % IN THE RCA NORMAL EF 65%  . CARDIAC CATHETERIZATION N/A 07/05/2015   Procedure: Left Heart Cath and Coronary Angiography;  Surgeon: DLeonie Man MD;  [facility];  Service: Cardiovascular: Angiographically minimal CAD. EF 60-65%  . CPET-MET Test (Cardiopulmonary Exercise Test)  03/06/2013   Adequate effort at 1.11. Peak VO2 12.3 is 86% in the normal range. Heart rate was just outside the normal range target being 127 beats a minute but this was 86% of projected. She did have an ischemic  pattern after the anterograde threshold with increased heart rate and blunting of stroke volume during the last 3 minutes of exercise, but was asymptomatic.  Marland Kitchen JOINT REPLACEMENT  09/05/2009   L knee  . KNEE SURGERY  1984   per pt screws in R knee  . NM MYOVIEW LTD  06/2015   FALSE + GXT - Images Accurate:  TM - 6 min, 7 METs (93% MPHR) --> chest discomfort, notable ST depressions => Hight Risk Duke TM Score, but NO scintigraphic evidence of ischemia or infarction. EF 74$  . 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 TRANSTHORACIC ECHOCARDIOGRAM  July 2013   EF greater than 55%, mild MR no prolapse.  Normal  . TUBAL LIGATION     Family History  Problem Relation Age of Onset  . Diabetes Mother   . Heart disease Mother   . Hyperlipidemia Mother   . Diabetes Sister   . Hyperlipidemia Sister   . Hypertension Sister   . Diabetes Brother   . Hyperlipidemia Brother   . Prostate cancer Son        prostate  . Hyperlipidemia Sister    Social History   Socioeconomic History  . Marital status: Married    Spouse name: Not on file  . Number of children: 3  . Years of education: Not on file  . Highest education level: Some college, no degree  Occupational History  . Occupation: retired  Scientific laboratory technician  . Financial resource strain: Not hard at all  . Food insecurity:    Worry: Never true    Inability: Never true  . Transportation needs:    Medical: No    Non-medical: No  Tobacco Use  . Smoking status: Former Smoker    Packs/day: 0.50    Years: 20.00    Pack years: 10.00    Types: Cigarettes    Last attempt to quit: 07/13/1981    Years since quitting: 36.1  . Smokeless tobacco: Never Used  Substance and Sexual Activity  . Alcohol use: No  . Drug use: No  . Sexual activity: Yes  Lifestyle  . Physical activity:    Days per week: 3 days    Minutes per session: 30 min  . Stress: Not at all  Relationships  . Social connections:    Talks on phone: More than three times a week    Gets together: More than three times a week    Attends religious service: More than 4 times per year    Active member of club or organization: Yes    Attends meetings of clubs or organizations: More than 4 times per year    Relationship status: Married  Other Topics Concern  . Not on file  Social History Narrative   SHE IS MARRIED ,MOTHER OF 4, GRANDMOTHER OF 6. SHE DOES EXERCISE BUT SOMEWHAT LIMITED BECAUSE OF HER KNEE (S/P SURGERY  2015).   SHE QUIT SMOKING IN 1980 AND SHE DOES NOT DRINK    Outpatient Encounter Medications as of 08/24/2017  Medication Sig  . amLODipine (NORVASC) 5 MG tablet Take 1 tablet (5 mg total) by mouth daily.  Marland Kitchen aspirin 81 MG tablet Take 81 mg by mouth daily.  Marland Kitchen atorvastatin (LIPITOR) 20 MG tablet take 1 tablet by mouth once daily  . benzonatate (TESSALON) 100 MG capsule Take 1-2 capsules (100-200 mg total)  by mouth 3 (three) times daily as needed for cough.  Marland Kitchen Co-Enzyme Q-10 30 MG CAPS Take 30 mg by mouth daily.  . hydrochlorothiazide (HYDRODIURIL) 25 MG tablet take 1 tablet by mouth once daily  . losartan (COZAAR) 100 MG tablet Take 1 tablet (100 mg total) by mouth daily.  . metFORMIN (GLUCOPHAGE) 500 MG tablet take 2 tablets by mouth twice a day with meals  . metFORMIN (GLUCOPHAGE) 500 MG tablet take 2 tablets by mouth twice a day with meals  . metoprolol succinate (TOPROL-XL) 50 MG 24 hr tablet take 1 tablet by mouth once daily  . Multiple Vitamin (MULTIVITAMIN) tablet Take 1 tablet by mouth daily.  Marland Kitchen OVER THE COUNTER MEDICATION Co Q 10 taking once daily  . PRESCRIPTION MEDICATION Timolol 0.05 % taking 1 drop to each eye every am  . timolol (TIMOPTIC) 0.5 % ophthalmic solution place 1 drop into both eyes twice a day (AT 10AM AND 5PM)  . Travoprost, BAK Free, (TRAVATAN) 0.004 % SOLN ophthalmic solution Place 1 drop into both eyes at bedtime.   No facility-administered encounter medications on file as of 08/24/2017.     Activities of Daily Living In your present state of health, do you have any difficulty performing the following activities: 08/24/2017 08/09/2017  Hearing? N N  Vision? N N  Difficulty concentrating or making decisions? N N  Walking or climbing stairs? N N  Dressing or bathing? N N  Doing errands, shopping? N N  Preparing Food and eating ? N -  Using the Toilet? N -  In the past six months, have you accidently leaked urine? N -  Do you have problems with loss of bowel  control? N -  Managing your Medications? N -  Managing your Finances? N -  Housekeeping or managing your Housekeeping? N -  Some recent data might be hidden    Patient Care Team: [name], PA-C as PCP - General (Family Medicine) Leonie Man, MD as Attending Physician (Cardiology) Bond, Tracie Harrier, MD as Referring Physician (Ophthalmology)    Assessment:   This is a routine wellness examination for Brissa.  Exercise Activities and Dietary recommendations Current Exercise Habits: Structured exercise class, Type of exercise: walking(ellipitical), Time (Minutes): 30, Frequency (Times/Week): 3, Weekly Exercise (Minutes/Week): 90, Intensity: Mild, Exercise limited by: None identified  Goals    . Weight (lb) < 170 lb (77.1 kg)     Patient would like to lose weight and get to around 170 lbs in the future.        Fall Risk Fall Risk  08/24/2017 08/24/2017 08/09/2017 05/12/2017 02/09/2017  Falls in the past year? No No No No No   Is the patient's home free of loose throw rugs in walkways, pet beds, electrical cords, etc?   yes      Grab bars in the bathroom? no      Handrails on the stairs?   yes      Adequate lighting?   yes  Timed Get Up and Go performed: yes, completed within 30 seconds   Depression Screen PHQ 2/9 Scores 08/24/2017 08/24/2017 08/09/2017 05/12/2017  PHQ - 2 Score 0 0 0 0     Cognitive Function     6CIT Screen 08/24/2017  What Year? 0 points  What month? 0 points  What time? 0 points  Count back from 20 0 points  Months in reverse 0 points  Repeat phrase 2 points  Total Score 2    Immunization History  Administered Date(s) Administered  . Influenza Split 04/15/2011, 02/17/2012, 03/09/2013  . Influenza, High Dose Seasonal PF 01/29/2016, 01/20/2017  . Influenza-Unspecified 02/23/2014, 01/31/2015, 01/20/2017  . Pneumococcal Conjugate-13 11/27/2013  . Pneumococcal Polysaccharide-23 10/27/2006  . Tdap 04/19/2006, 04/28/2016  . Zoster 07/02/2008   . Zoster Recombinat (Shingrix) 10/31/2016, 01/20/2017    Qualifies for Shingles Vaccine?up to date  Screening Tests Health Maintenance  Topic Date Due  . HEMOGLOBIN A1C  11/10/2017  . FOOT EXAM  02/09/2018  . OPHTHALMOLOGY EXAM  02/09/2018  . TETANUS/TDAP  04/28/2026  . INFLUENZA VACCINE  Completed  . DEXA SCAN  Completed  . PNA vac Low Risk Adult  Completed    Cancer Screenings: Lung: Low Dose CT Chest recommended if Age 66-80 years, 30 pack-year currently smoking OR have quit w/in 15years. Patient does not qualify. Breast:  Up to date on Mammogram? No longer required  Up to date of Bone Density/Dexa? Yes, completed 05/05/2016 Colorectal: No longer required  Additional Screenings:  Hepatitis B/HIV/Syphillis: not indicated   Hepatitis C Screening: not indicated      Plan:   I have personally reviewed and noted the following in the patient's chart:   . Medical and social history . Use of alcohol, tobacco or illicit drugs  . Current medications and supplements . Functional ability and status . Nutritional status . Physical activity . Advanced directives . List of other physicians . Hospitalizations, surgeries, and ER visits in previous 12 months . Vitals . Screenings to include cognitive, depression, and falls . Referrals and appointments  In addition, I have reviewed and discussed with patient certain preventive protocols, quality metrics, and best practice recommendations. A written personalized care plan for preventive services as well as general preventive health recommendations were provided to patient.     Andrez Grime, LPN  [id number]/52/0802

## 2017-08-24 NOTE — Progress Notes (Signed)
Patient ID: Angela Chambers, female    DOB: [birth year], 77 y.o.   MRN: [id number]  PCP: [name], PA-C  Chief Complaint  Patient presents with  . Hypertension    follow up   . Hyperlipidemia    follow up  (non fasting)  . Diabetes    follow up     Subjective:   Presents for evaluation of HTN, hyperlipidemia, diabetes.  A1C has been stable, last 7.6% 05/2017. LDL 78, Creatinine 0.88  Tolerating her medications without difficulty or adverse effects.  Checks home glucose intermittently. Fasting 120-138. Non-fasting 160's.   Review of Systems  Constitutional: Negative for activity change, appetite change, fatigue and unexpected weight change.  HENT: Negative for congestion, dental problem, ear pain, hearing loss, mouth sores, postnasal drip, rhinorrhea, sneezing, sore throat, tinnitus and trouble swallowing.   Eyes: Negative for photophobia, pain, redness and visual disturbance.  Respiratory: Negative for cough, chest tightness and shortness of breath.   Cardiovascular: Negative for chest pain, palpitations and leg swelling.  Gastrointestinal: Negative for abdominal pain, blood in stool, constipation, diarrhea, nausea and vomiting.  Endocrine: Negative for cold intolerance, heat intolerance, polydipsia, polyphagia and polyuria.  Genitourinary: Negative for dysuria, frequency, hematuria and urgency.  Musculoskeletal: Negative for arthralgias, gait problem, myalgias and neck stiffness.  Skin: Negative for rash.  Neurological: Negative for dizziness, speech difficulty, weakness, light-headedness, numbness and headaches.  Hematological: Negative for adenopathy.  Psychiatric/Behavioral: Negative for confusion and sleep disturbance. The patient is not nervous/anxious.        Patient Active Problem List   Diagnosis Date Noted  . Abnormal stress echo EKG portion with normal echo -> imaging confirmed the correct with normal coronaries 06/21/2015  . Exertional  chest pain 06/07/2015  . OA (osteoarthritis) of knee 09/25/2013  . Obesity (BMI 30.0-34.9)   . Palpitations   . Hypertensive heart disease without CHF 11/30/2011  . Hyperlipidemia 08/25/2011  . Other and combined forms of senile cataract 07/04/2011  . Primary open angle glaucoma of both eyes, severe stage 07/04/2011  . Diabetes mellitus type 2, uncomplicated ([place]) 68/05/7516  . PERSONAL HISTORY OF COLONIC POLYPS 02/22/2009     Prior to Admission medications   Medication Sig Start Date End Date Taking? Authorizing Provider  amLODipine (NORVASC) 5 MG tablet Take 1 tablet (5 mg total) by mouth daily. 05/12/17  Yes Jonaya Freshour, PA-C  aspirin 81 MG tablet Take 81 mg by mouth daily.   Yes [provider]  atorvastatin (LIPITOR) 20 MG tablet take 1 tablet by mouth once daily 05/15/17  Yes Vijay Durflinger, PA-C  Co-Enzyme Q-10 30 MG CAPS Take 30 mg by mouth daily.   Yes [provider]  hydrochlorothiazide (HYDRODIURIL) 25 MG tablet take 1 tablet by mouth once daily 07/01/17  Yes Leonie Man, MD  losartan (COZAAR) 100 MG tablet Take 1 tablet (100 mg total) by mouth daily. 05/12/17  Yes Kaleia Longhi, PA-C  metFORMIN (GLUCOPHAGE) 500 MG tablet take 2 tablets by mouth twice a day with meals 07/05/17  Yes Merritt Kibby, PA-C  metoprolol succinate (TOPROL-XL) 50 MG 24 hr tablet take 1 tablet by mouth once daily 02/15/17  Yes Leonie Man, MD  Multiple Vitamin (MULTIVITAMIN) tablet Take 1 tablet by mouth daily.   Yes [provider]  OVER THE COUNTER MEDICATION Co Q 10 taking once daily   Yes [provider]  PRESCRIPTION MEDICATION Timolol 0.05 % taking 1 drop to each eye every am   Yes  [provider]  timolol (TIMOPTIC) 0.5 % ophthalmic solution place 1 drop into both eyes twice a day (AT 10AM AND 5PM) 02/06/17  Yes Mckinzey Entwistle, PA-C  Travoprost, BAK Free, (TRAVATAN) 0.004 % SOLN ophthalmic solution Place 1 drop into both eyes at bedtime.    Yes [provider]     Allergies  Allergen Reactions  . Lisinopril Cough  . Metformin And Related     Diarrhea on 1000mg  BID, tolerates 500 mg BID       Objective:  Physical Exam  Constitutional: She is oriented to person, place, and time. She appears well-developed and well-nourished. She is active and cooperative. No distress.  BP 120/70   Pulse 84   Temp 98.5 F (36.9 C)   Resp 16   Ht 5\' 5"  (1.651 m)   Wt 186 lb (84.4 kg)   LMP  (LMP Unknown)   SpO2 98%   BMI 30.95 kg/m   HENT:  Head: Normocephalic and atraumatic.  Right Ear: Hearing normal.  Left Ear: Hearing normal.  Eyes: Conjunctivae are normal. No scleral icterus.  Neck: Normal range of motion. Neck supple. No thyromegaly present.  Cardiovascular: Normal rate, regular rhythm and normal heart sounds.  Pulses:      Radial pulses are 2+ on the right side, and 2+ on the left side.  Pulmonary/Chest: Effort normal and breath sounds normal.  Lymphadenopathy:       Head (right side): No tonsillar, no preauricular, no posterior auricular and no occipital adenopathy present.       Head (left side): No tonsillar, no preauricular, no posterior auricular and no occipital adenopathy present.    She has no cervical adenopathy.       Right: No supraclavicular adenopathy present.       Left: No supraclavicular adenopathy present.  Neurological: She is alert and oriented to person, place, and time. No sensory deficit.  Skin: Skin is warm, dry and intact. No rash noted. No cyanosis or erythema. Nails show no clubbing.  Psychiatric: She has a normal mood and affect. Her speech is normal and behavior is normal.   Wt Readings from Last 3 Encounters:  08/24/17 186 lb (84.4 kg)  08/24/17 186 lb (84.4 kg)  08/09/17 185 lb 12.8 oz (84.3 kg)           Assessment & Plan:   Problem List Items Addressed This Visit    Diabetes mellitus type 2, uncomplicated ([place]) - Primary (Chronic)    Await labs. Adjust regimen as  indicated by results. Would add Januvia/Onglyza if needed.      Relevant Medications   atorvastatin (LIPITOR) 20 MG tablet   Other Relevant Orders   Comprehensive metabolic panel (Completed)   Hemoglobin A1c (Completed)   Hyperlipidemia (Chronic)    Await labs. Adjust regimen as indicated by results. Goal LDL <70.      Relevant Medications   atorvastatin (LIPITOR) 20 MG tablet   Other Relevant Orders   Comprehensive metabolic panel (Completed)   Lipid panel (Completed)   Obesity (BMI 30.0-34.9) (Chronic)    Discussed healthy eating and increased exercise.      Hypertensive heart disease without CHF (Chronic)    Good BP control. Continue current treatment.      Relevant Medications   atorvastatin (LIPITOR) 20 MG tablet   Other Relevant Orders   CBC with Differential/Platelet (Completed)   Comprehensive metabolic panel (Completed)       Return in about 3 months (around 11/09/2017) for re-evaluation  of blood pressure, cholesterol and diabetes.   Fara Chute, PA-C [facility]

## 2017-08-25 DIAGNOSIS — H401133 Primary open-angle glaucoma, bilateral, severe stage: Secondary | ICD-10-CM | POA: Diagnosis not present

## 2017-08-25 LAB — COMPREHENSIVE METABOLIC PANEL
A/G RATIO: 1.7 (ref 1.2–2.2)
ALT: 12 IU/L (ref 0–32)
AST: 12 IU/L (ref 0–40)
Albumin: 4.2 g/dL (ref 3.5–4.8)
Alkaline Phosphatase: 88 IU/L (ref 39–117)
BILIRUBIN TOTAL: 0.5 mg/dL (ref 0.0–1.2)
BUN/Creatinine Ratio: 26 (ref 12–28)
BUN: 22 mg/dL (ref 8–27)
CALCIUM: 9.8 mg/dL (ref 8.7–10.3)
CHLORIDE: 99 mmol/L (ref 96–106)
CO2: 25 mmol/L (ref 20–29)
Creatinine, Ser: 0.86 mg/dL (ref 0.57–1.00)
GFR calc Af Amer: 75 mL/min/{1.73_m2} (ref >59)
GFR calc non Af Amer: 65 mL/min/{1.73_m2} (ref >59)
GLUCOSE: 181 mg/dL — AB (ref 65–99)
Globulin, Total: 2.5 g/dL (ref 1.5–4.5)
POTASSIUM: 4 mmol/L (ref 3.5–5.2)
Sodium: 140 mmol/L (ref 134–144)
Total Protein: 6.7 g/dL (ref 6.0–8.5)

## 2017-08-25 LAB — CBC WITH DIFFERENTIAL/PLATELET
BASOS ABS: 0 10*3/uL (ref 0.0–0.2)
BASOS: 0 %
EOS (ABSOLUTE): 0.1 10*3/uL (ref 0.0–0.4)
Eos: 2 %
Hematocrit: 34.1 % (ref 34.0–46.6)
Hemoglobin: 11.4 g/dL (ref 11.1–15.9)
IMMATURE GRANULOCYTES: 0 %
Immature Grans (Abs): 0 10*3/uL (ref 0.0–0.1)
LYMPHS: 44 %
Lymphocytes Absolute: 2.2 10*3/uL (ref 0.7–3.1)
MCH: 28.1 pg (ref 26.6–33.0)
MCHC: 33.4 g/dL (ref 31.5–35.7)
MCV: 84 fL (ref 79–97)
MONOS ABS: 0.3 10*3/uL (ref 0.1–0.9)
Monocytes: 6 %
NEUTROS PCT: 48 %
Neutrophils Absolute: 2.4 10*3/uL (ref 1.4–7.0)
PLATELETS: 351 10*3/uL (ref 150–379)
RBC: 4.06 x10E6/uL (ref 3.77–5.28)
RDW: 14.8 % (ref 12.3–15.4)
WBC: 5 10*3/uL (ref 3.4–10.8)

## 2017-08-25 LAB — LIPID PANEL
CHOLESTEROL TOTAL: 169 mg/dL (ref 100–199)
Chol/HDL Ratio: 2.4 ratio (ref 0.0–4.4)
HDL: 70 mg/dL (ref >39)
LDL Calculated: 78 mg/dL (ref 0–99)
Triglycerides: 103 mg/dL (ref 0–149)
VLDL Cholesterol Cal: 21 mg/dL (ref 5–40)

## 2017-08-25 LAB — HEMOGLOBIN A1C
ESTIMATED AVERAGE GLUCOSE: 180 mg/dL
Hgb A1c MFr Bld: 7.9 % — ABNORMAL HIGH (ref 4.8–5.6)

## 2017-08-30 NOTE — Assessment & Plan Note (Signed)
Discussed healthy eating and increased exercise.

## 2017-08-30 NOTE — Assessment & Plan Note (Signed)
Await labs. Adjust regimen as indicated by results. Goal LDL <70 

## 2017-08-30 NOTE — Assessment & Plan Note (Signed)
Await labs. Adjust regimen as indicated by results. Would add Januvia/Onglyza if needed.

## 2017-08-30 NOTE — Assessment & Plan Note (Signed)
Good BP control. Continue current treatment.

## 2017-09-06 ENCOUNTER — Encounter: Payer: Self-pay | Admitting: Physician Assistant

## 2017-09-14 ENCOUNTER — Other Ambulatory Visit: Payer: Self-pay | Admitting: Cardiology

## 2017-11-10 ENCOUNTER — Encounter: Payer: Self-pay | Admitting: Physician Assistant

## 2017-11-10 ENCOUNTER — Other Ambulatory Visit: Payer: Self-pay

## 2017-11-10 ENCOUNTER — Ambulatory Visit: Payer: Medicare Other | Admitting: Physician Assistant

## 2017-11-10 VITALS — BP 138/72 | HR 79 | Temp 98.8°F | Ht 65.0 in | Wt 187.4 lb

## 2017-11-10 DIAGNOSIS — I119 Hypertensive heart disease without heart failure: Secondary | ICD-10-CM

## 2017-11-10 DIAGNOSIS — E7849 Other hyperlipidemia: Secondary | ICD-10-CM | POA: Diagnosis not present

## 2017-11-10 DIAGNOSIS — E119 Type 2 diabetes mellitus without complications: Secondary | ICD-10-CM | POA: Diagnosis not present

## 2017-11-10 DIAGNOSIS — E669 Obesity, unspecified: Secondary | ICD-10-CM

## 2017-11-10 LAB — HEMOGLOBIN A1C
Est. average glucose Bld gHb Est-mCnc: 163 mg/dL
HEMOGLOBIN A1C: 7.3 % — AB (ref 4.8–5.6)

## 2017-11-10 LAB — LIPID PANEL
CHOL/HDL RATIO: 2.5 ratio (ref 0.0–4.4)
Cholesterol, Total: 173 mg/dL (ref 100–199)
HDL: 69 mg/dL (ref >39)
LDL Calculated: 82 mg/dL (ref 0–99)
Triglycerides: 112 mg/dL (ref 0–149)
VLDL Cholesterol Cal: 22 mg/dL (ref 5–40)

## 2017-11-10 LAB — COMPREHENSIVE METABOLIC PANEL
A/G RATIO: 1.5 (ref 1.2–2.2)
ALBUMIN: 4 g/dL (ref 3.5–4.8)
ALK PHOS: 89 IU/L (ref 39–117)
ALT: 10 IU/L (ref 0–32)
AST: 9 IU/L (ref 0–40)
BILIRUBIN TOTAL: 0.4 mg/dL (ref 0.0–1.2)
BUN / CREAT RATIO: 27 (ref 12–28)
BUN: 25 mg/dL (ref 8–27)
CHLORIDE: 105 mmol/L (ref 96–106)
CO2: 26 mmol/L (ref 20–29)
Calcium: 10 mg/dL (ref 8.7–10.3)
Creatinine, Ser: 0.92 mg/dL (ref 0.57–1.00)
GFR calc non Af Amer: 60 mL/min/{1.73_m2} (ref >59)
GFR, EST AFRICAN AMERICAN: 69 mL/min/{1.73_m2} (ref >59)
GLOBULIN, TOTAL: 2.6 g/dL (ref 1.5–4.5)
Glucose: 161 mg/dL — ABNORMAL HIGH (ref 65–99)
Potassium: 4 mmol/L (ref 3.5–5.2)
SODIUM: 145 mmol/L — AB (ref 134–144)
Total Protein: 6.6 g/dL (ref 6.0–8.5)

## 2017-11-10 LAB — CBC WITH DIFFERENTIAL/PLATELET
BASOS ABS: 0.1 10*3/uL (ref 0.0–0.2)
BASOS: 1 %
EOS (ABSOLUTE): 0.2 10*3/uL (ref 0.0–0.4)
EOS: 3 %
HEMATOCRIT: 34.2 % (ref 34.0–46.6)
Hemoglobin: 11.9 g/dL (ref 11.1–15.9)
Immature Grans (Abs): 0 10*3/uL (ref 0.0–0.1)
Immature Granulocytes: 0 %
LYMPHS ABS: 2.5 10*3/uL (ref 0.7–3.1)
Lymphs: 44 %
MCH: 28.7 pg (ref 26.6–33.0)
MCHC: 34.8 g/dL (ref 31.5–35.7)
MCV: 83 fL (ref 79–97)
Monocytes Absolute: 0.4 10*3/uL (ref 0.1–0.9)
Monocytes: 7 %
NEUTROS ABS: 2.5 10*3/uL (ref 1.4–7.0)
Neutrophils: 45 %
Platelets: 297 10*3/uL (ref 150–450)
RBC: 4.14 x10E6/uL (ref 3.77–5.28)
RDW: 15.6 % — ABNORMAL HIGH (ref 12.3–15.4)
WBC: 5.6 10*3/uL (ref 3.4–10.8)

## 2017-11-10 NOTE — Progress Notes (Signed)
Patient ID: Angela Chambers, female    DOB: July 01, 1940, 77 y.o.   MRN: [id number]  PCP: [name], PA-C  Chief Complaint  Patient presents with  . Chronic Condition    3 month follow up     Subjective:   Presents for evaluation of Diabetes, HTN and hyperlipidemia.  She feels good.  No problems or concerns.  Tolerating her medications without difficulty. Last labs 07/2017 A1C 7.9%, up from 7.6%. LDL 78, HDL 70   Review of Systems  Constitutional: Negative.  Negative for activity change, appetite change, fatigue and unexpected weight change.  HENT: Negative for congestion, dental problem, ear pain, hearing loss, mouth sores, postnasal drip, rhinorrhea, sneezing, sore throat, tinnitus and trouble swallowing.   Eyes: Negative for photophobia, pain, redness and visual disturbance.  Respiratory: Negative for cough, chest tightness, shortness of breath and wheezing.   Cardiovascular: Negative for chest pain, palpitations and leg swelling.  Gastrointestinal: Negative for abdominal pain, blood in stool, constipation, diarrhea, nausea and vomiting.  Endocrine: Negative for cold intolerance, heat intolerance, polydipsia, polyphagia and polyuria.  Genitourinary: Negative for dysuria, frequency, hematuria and urgency.  Musculoskeletal: Negative for arthralgias, gait problem, myalgias and neck stiffness.  Skin: Negative for rash.  Neurological: Negative for dizziness, speech difficulty, weakness, light-headedness, numbness and headaches.  Hematological: Negative for adenopathy.  Psychiatric/Behavioral: Negative for confusion, decreased concentration and sleep disturbance. The patient is not nervous/anxious.        Patient Active Problem List   Diagnosis Date Noted  . Abnormal stress echo EKG portion with normal echo -> imaging confirmed the correct with normal coronaries 06/21/2015  . Exertional chest pain 06/07/2015  . OA (osteoarthritis) of knee 09/25/2013  . Obesity  (BMI 30.0-34.9)   . Palpitations   . Hypertensive heart disease without CHF 11/30/2011  . Hyperlipidemia 08/25/2011  . Other and combined forms of senile cataract 07/04/2011  . Primary open angle glaucoma of both eyes, severe stage 07/04/2011  . Diabetes mellitus type 2, uncomplicated ([place]) 91/79/1505  . PERSONAL HISTORY OF COLONIC POLYPS 02/22/2009   Prior to Admission medications   Medication Sig Start Date End Date Taking? Authorizing Provider  amLODipine (NORVASC) 5 MG tablet Take 1 tablet (5 mg total) by mouth daily. 05/12/17  Yes Apryl Brymer, PA-C  aspirin 81 MG tablet Take 81 mg by mouth daily.   Yes [provider]  atorvastatin (LIPITOR) 20 MG tablet Take 1 tablet (20 mg total) by mouth daily. 08/24/17  Yes Annalyssa Thune, PA-C  Co-Enzyme Q-10 30 MG CAPS Take 30 mg by mouth daily.   Yes [provider]  hydrochlorothiazide (HYDRODIURIL) 25 MG tablet take 1 tablet by mouth once daily 07/01/17  Yes Leonie Man, MD  losartan (COZAAR) 100 MG tablet Take 1 tablet (100 mg total) by mouth daily. 05/12/17  Yes Baylie Drakes, PA-C  metFORMIN (GLUCOPHAGE) 500 MG tablet take 2 tablets by mouth twice a day with meals 07/05/17  Yes Corita Allinson, PA-C  metoprolol succinate (TOPROL-XL) 50 MG 24 hr tablet TAKE 1 TABLET BY MOUTH ONCE DAILY 09/14/17  Yes Leonie Man, MD  Multiple Vitamin (MULTIVITAMIN) tablet Take 1 tablet by mouth daily.   Yes [provider]  PRESCRIPTION MEDICATION Timolol 0.05 % taking 1 drop to each eye every am   Yes [provider]  Travoprost, BAK Free, (TRAVATAN) 0.004 % SOLN ophthalmic solution Place 1 drop into both eyes at bedtime.   Yes [provider]  dorzolamide-timolol (COSOPT) 22.3-6.8  MG/ML ophthalmic solution INSTILL 1 DROP INTO BOTH EYES TWICE A DAY 09/23/17   [provider]      Allergies  Allergen Reactions  . Lisinopril Cough  . Metformin And Related     Diarrhea on 1000mg  BID,  tolerates 500 mg BID       Objective:  Physical Exam  Constitutional: She is oriented to person, place, and time. She appears well-developed and well-nourished. She is active and cooperative. No distress.  BP 138/72 (BP Location: Left Arm, Patient Position: Sitting, Cuff Size: Large)   Pulse 79   Temp 98.8 F (37.1 C) (Oral)   Ht 5\' 5"  (1.651 m)   Wt 187 lb 6.4 oz (85 kg)   LMP  (LMP Unknown)   SpO2 99%   BMI 31.18 kg/m   HENT:  Head: Normocephalic and atraumatic.  Right Ear: Hearing normal.  Left Ear: Hearing normal.  Eyes: Conjunctivae are normal. No scleral icterus.  Neck: Normal range of motion. Neck supple. No thyromegaly present.  Cardiovascular: Normal rate, regular rhythm and normal heart sounds.  Pulses:      Radial pulses are 2+ on the right side, and 2+ on the left side.  Pulmonary/Chest: Effort normal and breath sounds normal.  Lymphadenopathy:       Head (right side): No tonsillar, no preauricular, no posterior auricular and no occipital adenopathy present.       Head (left side): No tonsillar, no preauricular, no posterior auricular and no occipital adenopathy present.    She has no cervical adenopathy.       Right: No supraclavicular adenopathy present.       Left: No supraclavicular adenopathy present.  Neurological: She is alert and oriented to person, place, and time. No sensory deficit.  Skin: Skin is warm, dry and intact. No rash noted. No cyanosis or erythema. Nails show no clubbing.  Psychiatric: She has a normal mood and affect. Her speech is normal and behavior is normal.    Wt Readings from Last 3 Encounters:  11/10/17 187 lb 6.4 oz (85 kg)  08/24/17 186 lb (84.4 kg)  08/24/17 186 lb (84.4 kg)       Assessment & Plan:   Problem List Items Addressed This Visit    Obesity (BMI 30.0-34.9) (Chronic)    Encouraged continued efforts for lifestyle changes, specifically healthy eating choices (lean protein, lots of vegetables) and increased exercise  (150 minutes/week).      Hypertensive heart disease without CHF (Chronic)    Good hypertension control.  Continue amlodipine 5 mg, hydrochlorothiazide 25 mg, losartan 100 mg and metoprolol succinate 50 mg daily.      Relevant Orders   CBC with Differential/Platelet (Completed)   Comprehensive metabolic panel (Completed)   Hyperlipidemia (Chronic)    Await labs.  Goal LDL is less than 70.  If need additional lowering, increase atorvastatin from 20 mg to 40 mg.      Relevant Orders   Comprehensive metabolic panel (Completed)   Lipid panel (Completed)   Diabetes mellitus type 2, uncomplicated ([place]) - Primary (Chronic)    Has been well controlled.  Anticipate same.  Continue metformin and healthy lifestyle modification.      Relevant Orders   Comprehensive metabolic panel (Completed)   Hemoglobin A1c (Completed)       Return in about 3 months (around 02/10/2018) for re-evaluation of diabetes, blood pressure, cholesterol.   Fara Chute, PA-C [facility]

## 2017-11-10 NOTE — Patient Instructions (Signed)
     IF you received an x-ray today, you will receive an invoice from [facility]. Please contact [facility] at [phone] with questions or concerns regarding your invoice.   IF you received labwork today, you will receive an invoice from [facility]. Please contact [facility] at [phone] with questions or concerns regarding your invoice.   Our billing staff will not be able to assist you with questions regarding bills from these companies.  You will be contacted with the lab results as soon as they are available. The fastest way to get your results is to activate your My Chart account. Instructions are located on the last page of this paperwork. If you have not heard from us regarding the results in 2 weeks, please contact this office.     

## 2017-11-12 NOTE — Assessment & Plan Note (Signed)
Await labs.  Goal LDL is less than 70.  If need additional lowering, increase atorvastatin from 20 mg to 40 mg.

## 2017-11-12 NOTE — Assessment & Plan Note (Signed)
Good hypertension control.  Continue amlodipine 5 mg, hydrochlorothiazide 25 mg, losartan 100 mg and metoprolol succinate 50 mg daily.

## 2017-11-12 NOTE — Assessment & Plan Note (Signed)
Has been well controlled.  Anticipate same.  Continue metformin and healthy lifestyle modification.

## 2017-11-12 NOTE — Assessment & Plan Note (Signed)
Encouraged continued efforts for lifestyle changes, specifically healthy eating choices (lean protein, lots of vegetables) and increased exercise (150 minutes/week).

## 2017-11-29 ENCOUNTER — Encounter: Payer: Self-pay | Admitting: Physician Assistant

## 2017-12-12 ENCOUNTER — Other Ambulatory Visit: Payer: Self-pay | Admitting: Cardiology

## 2017-12-30 DIAGNOSIS — H401133 Primary open-angle glaucoma, bilateral, severe stage: Secondary | ICD-10-CM | POA: Diagnosis not present

## 2018-02-07 ENCOUNTER — Ambulatory Visit: Payer: Medicare Other | Admitting: Physician Assistant

## 2018-02-07 ENCOUNTER — Other Ambulatory Visit: Payer: Self-pay

## 2018-02-07 ENCOUNTER — Encounter: Payer: Self-pay | Admitting: Physician Assistant

## 2018-02-07 VITALS — BP 116/62 | HR 80 | Temp 98.6°F | Resp 18 | Ht 65.35 in | Wt 190.0 lb

## 2018-02-07 DIAGNOSIS — I10 Essential (primary) hypertension: Secondary | ICD-10-CM | POA: Diagnosis not present

## 2018-02-07 DIAGNOSIS — E119 Type 2 diabetes mellitus without complications: Secondary | ICD-10-CM | POA: Diagnosis not present

## 2018-02-07 DIAGNOSIS — E669 Obesity, unspecified: Secondary | ICD-10-CM | POA: Diagnosis not present

## 2018-02-07 DIAGNOSIS — Z23 Encounter for immunization: Secondary | ICD-10-CM | POA: Diagnosis not present

## 2018-02-07 DIAGNOSIS — E785 Hyperlipidemia, unspecified: Secondary | ICD-10-CM | POA: Diagnosis not present

## 2018-02-07 DIAGNOSIS — M8580 Other specified disorders of bone density and structure, unspecified site: Secondary | ICD-10-CM

## 2018-02-07 LAB — POCT GLYCOSYLATED HEMOGLOBIN (HGB A1C): Hemoglobin A1C: 7.9 % — AB (ref 4.0–5.6)

## 2018-02-07 MED ORDER — METFORMIN HCL ER 500 MG PO TB24: 2000.0000 mg | ORAL_TABLET | Freq: Every day | ORAL | 0 refills | Status: DC

## 2018-02-07 MED ORDER — BLOOD GLUCOSE MONITOR SYSTEM W/DEVICE KIT: 1.0000 [IU] | PACK | Freq: Two times a day (BID) | 0 refills | Status: AC

## 2018-02-07 NOTE — Patient Instructions (Addendum)
For diabetes, I have sent in a new prescription for metformin extended release to your pharmacy.  These are 500mg  tablets.  Start with taking 2 tablets in the morning with food for the first week.  After 1 week, increase to 3 tablets in the morning with food.  After 2 weeks, increase to 4 tablets in the morning with food.  Continue 4 tablets if you are tolerating well.  I have sent in a prescription for glucometer.  Please check your sugars in the morning when you are fasting and then after your biggest meal.  Also try to work on dietary changes such as decreasing carbs where you can.  Follow-up in 3 months for reevaluation.  I have placed a referral for DEXA scan and they should contact you within the next 2 weeks to schedule this appointment.    If you have lab work done today you will be contacted with your lab results within the next 2 weeks.  If you have not heard from [place] then please contact us. The fastest way to get your results is to register for My Chart.  Bone Health Bones protect organs, store calcium, and anchor muscles. Good health habits, such as eating nutritious foods and exercising regularly, are important for maintaining healthy bones. They can also help to prevent a condition that causes bones to lose density and become weak and brittle (osteoporosis). Why is bone mass important? Bone mass refers to the amount of bone tissue that you have. The higher your bone mass, the stronger your bones. An important step toward having healthy bones throughout life is to have strong and dense bones during childhood. A young adult who has a high bone mass is more likely to have a high bone mass later in life. Bone mass at its greatest it is called peak bone mass. A large decline in bone mass occurs in older adults. In women, it occurs about the time of menopause. During this time, it is important to practice good health habits, because if more bone is lost than what is replaced, the bones will  become less healthy and more likely to break (fracture). If you find that you have a low bone mass, you may be able to prevent osteoporosis or further bone loss by changing your diet and lifestyle. How can I find out if my bone mass is low? Bone mass can be measured with an X-ray test that is called a bone mineral density (BMD) test. This test is recommended for all women who are age 71 or older. It may also be recommended for men who are age 65 or older, or for people who are more likely to develop osteoporosis due to:  Having bones that break easily.  Having a long-term disease that weakens bones, such as kidney disease or rheumatoid arthritis.  Having menopause earlier than normal.  Taking medicine that weakens bones, such as steroids, thyroid hormones, or hormone treatment for breast cancer or prostate cancer.  Smoking.  Drinking three or more alcoholic drinks each day.  What are the nutritional recommendations for healthy bones? To have healthy bones, you need to get enough of the right minerals and vitamins. Most nutrition experts recommend getting these nutrients from the foods that you eat. Nutritional recommendations vary from person to person. Ask your health care provider what is healthy for you. Here are some general guidelines. Calcium Recommendations Calcium is the most important (essential) mineral for bone health. Most people can get enough calcium from their  diet, but supplements may be recommended for people who are at risk for osteoporosis. Good sources of calcium include:  Dairy products, such as low-fat or nonfat milk, cheese, and yogurt.  Dark green leafy vegetables, such as bok choy and broccoli.  Calcium-fortified foods, such as orange juice, cereal, bread, soy beverages, and tofu products.  Nuts, such as almonds.  Follow these recommended amounts for daily calcium intake:  Children, age 590?3: 700 mg.  Children, age 59?8: 1,000 mg.  Children, age 34?13: 1,300  mg.  Teens, age 77?18: 1,300 mg.  Adults, age 35?50: 1,000 mg.  Adults, age 77?70: ? Men: 1,000 mg. ? Women: 1,200 mg.  Adults, age 62 or older: 1,200 mg.  Pregnant and breastfeeding females: ? Teens: 1,300 mg. ? Adults: 1,000 mg.  Vitamin D Recommendations Vitamin D is the most essential vitamin for bone health. It helps the body to absorb calcium. Sunlight stimulates the skin to make vitamin D, so be sure to get enough sunlight. If you live in a cold climate or you do not get outside often, your health care provider may recommend that you take vitamin D supplements. Good sources of vitamin D in your diet include:  Egg yolks.  Saltwater fish.  Milk and cereal fortified with vitamin D.  Follow these recommended amounts for daily vitamin D intake:  Children and teens, age 37?18: 31 international units.  Adults, age 29 or younger: 400-800 international units.  Adults, age 58 or older: 800-1,000 international units.  Other Nutrients Other nutrients for bone health include:  Phosphorus. This mineral is found in meat, poultry, dairy foods, nuts, and legumes. The recommended daily intake for adult men and adult women is 700 mg.  Magnesium. This mineral is found in seeds, nuts, dark green vegetables, and legumes. The recommended daily intake for adult men is 400?420 mg. For adult women, it is 310?320 mg.  Vitamin K. This vitamin is found in green leafy vegetables. The recommended daily intake is 120 mg for adult men and 90 mg for adult women.  What type of physical activity is best for building and maintaining healthy bones? Weight-bearing and strength-building activities are important for building and maintaining peak bone mass. Weight-bearing activities cause muscles and bones to work against gravity. Strength-building activities increases muscle strength that supports bones. Weight-bearing and muscle-building activities include:  Walking and hiking.  Jogging and  running.  Dancing.  Gym exercises.  Lifting weights.  Tennis and racquetball.  Climbing stairs.  Aerobics.  Adults should get at least 30 minutes of moderate physical activity on most days. Children should get at least 60 minutes of moderate physical activity on most days. Ask your health care provide what type of exercise is best for you. Where can I find more information? For more information, check out the following websites:  [place]: YardHomes.se  [employer] of Health: http://www.niams.AnonymousEar.fr.asp  This information is not intended to replace advice given to you by your health care provider. Make sure you discuss any questions you have with your health care provider. Document Released: 08/08/2003 Document Revised: 12/06/2015 Document Reviewed: 05/23/2014 Elsevier Interactive Patient Education  2018 [employer].  Preventing Osteoporosis, Adult Osteoporosis is a condition that causes the bones to get weaker. With osteoporosis, the bones become thinner, and the normal spaces in bone tissue become larger. This can make the bones weak and cause them to break more easily. People who have osteoporosis are more likely to break their wrist, spine, or hip. Even a minor  accident or injury can be enough to break weak bones. Osteoporosis can occur with aging. Your body constantly replaces old bone tissue with new tissue. As you get older, you may lose bone tissue more quickly, or it may be replaced more slowly. Osteoporosis is more likely to develop if you have poor nutrition or do not get enough calcium or vitamin D. Other lifestyle factors can also play a role. By making some diet and lifestyle changes, you can help to keep your bones healthy and help to prevent osteoporosis. What nutrition changes can be made? Nutrition plays an important role in maintaining healthy, strong bones.  Make sure  you get enough calcium every day from food or from calcium supplements. ? If you are age 46 or younger, aim to get 1,000 mg of calcium every day. ? If you are older than age 49, aim to get 1,200 mg of calcium every day.  Try to get enough vitamin D every day. ? If you are age 67 or younger, aim to get 600 international units (IU) every day. ? If you are older than age 82, aim to get 800 international units every day.  Follow a healthy diet. Eat plenty of foods that contain calcium and vitamin D. ? Calcium is in milk, cheese, yogurt, and other dairy products. Some fish and vegetables are also good sources of calcium. Many foods such as cereals and breads have had calcium added to them (are fortified). Check nutrition labels to see how much calcium is in a food or drink. ? Foods that contain vitamin D include milk, cereals, salmon, and tuna. Your body also makes vitamin D when you are out in the sun. Bare skin exposure to the sun on your face, arms, legs, or back for no more than 30 minutes a day, 2 times per week is more than enough. Beyond that, it is important to use sunblock to protect your skin from sunburn, which increases your risk for skin cancer.  What lifestyle changes can be made? Making changes in your everyday life can also play an important role in preventing osteoporosis.  Stay active and get exercise every day. Ask your health care provider what types of exercise are best for you.  Do not use any products that contain nicotine or tobacco, such as cigarettes and e-cigarettes. If you need help quitting, ask your health care provider.  Limit alcohol intake to no more than 1 drink a day for nonpregnant women and 2 drinks a day for men. One drink equals 12 oz of beer, 5 oz of wine, or 1 oz of hard liquor.  Why are these changes important? Making these nutrition and lifestyle changes can:  Help you develop and maintain healthy, strong bones.  Prevent loss of bone mass and the  problems that are caused by that loss, such as broken bones and delayed healing.  Make you feel better mentally and physically.  What can happen if changes are not made? Problems that can result from osteoporosis can be very serious. These may include:  A higher risk of broken bones that are painful and do not heal well.  Physical malformations, such as a collapsed spine or a hunched back.  Problems with movement.  Where to find support: If you need help making changes to prevent osteoporosis, talk with your health care provider. You can ask for a referral to a diet and nutrition specialist (dietitian) and a physical therapist. Where to find more information: Learn more about osteoporosis from:  NIH Osteoporosis and Related [place]: www.niams.GolfingGoddess.com.br  U.S. Office on Women's Health: SouvenirBaseball.es.html  National Osteoporosis Foundation: ProfilePeek.ch  Summary  Osteoporosis is a condition that causes weak bones that are more likely to break.  Eating a healthy diet and making sure you get enough calcium and vitamin D can help prevent osteoporosis.  Other ways to reduce your risk of osteoporosis include getting regular exercise and avoiding alcohol and products that contain nicotine or tobacco. This information is not intended to replace advice given to you by your health care provider. Make sure you discuss any questions you have with your health care provider. Document Released: 06/02/2015 Document Revised: 01/27/2016 Document Reviewed: 01/27/2016 Elsevier Interactive Patient Education  2018 [employer].   Diabetes Mellitus and Nutrition When you have diabetes (diabetes mellitus), it is very important to have healthy eating habits because your blood sugar (glucose) levels are greatly affected by what you eat and  drink. Eating healthy foods in the appropriate amounts, at about the same times every day, can help you:  Control your blood glucose.  Lower your risk of heart disease.  Improve your blood pressure.  Reach or maintain a healthy weight.  Every person with diabetes is different, and each person has different needs for a meal plan. Your health care provider may recommend that you work with a diet and nutrition specialist (dietitian) to make a meal plan that is best for you. Your meal plan may vary depending on factors such as:  The calories you need.  The medicines you take.  Your weight.  Your blood glucose, blood pressure, and cholesterol levels.  Your activity level.  Other health conditions you have, such as heart or kidney disease.  How do carbohydrates affect me? Carbohydrates affect your blood glucose level more than any other type of food. Eating carbohydrates naturally increases the amount of glucose in your blood. Carbohydrate counting is a method for keeping track of how many carbohydrates you eat. Counting carbohydrates is important to keep your blood glucose at a healthy level, especially if you use insulin or take certain oral diabetes medicines. It is important to know how many carbohydrates you can safely have in each meal. This is different for every person. Your dietitian can help you calculate how many carbohydrates you should have at each meal and for snack. Foods that contain carbohydrates include:  Bread, cereal, rice, pasta, and crackers.  Potatoes and corn.  Peas, beans, and lentils.  Milk and yogurt.  Fruit and juice.  Desserts, such as cakes, cookies, ice cream, and candy.  How does alcohol affect me? Alcohol can cause a sudden decrease in blood glucose (hypoglycemia), especially if you use insulin or take certain oral diabetes medicines. Hypoglycemia can be a life-threatening condition. Symptoms of hypoglycemia (sleepiness, dizziness, and confusion)  are similar to symptoms of having too much alcohol. If your health care provider says that alcohol is safe for you, follow these guidelines:  Limit alcohol intake to no more than 1 drink per day for nonpregnant women and 2 drinks per day for men. One drink equals 12 oz of beer, 5 oz of wine, or 1 oz of hard liquor.  Do not drink on an empty stomach.  Keep yourself hydrated with water, diet soda, or unsweetened iced tea.  Keep in mind that regular soda, juice, and other mixers may contain a lot of sugar and must be counted as carbohydrates.  What are tips for following this plan? Reading food labels  Start by checking the serving size on the label. The amount of calories, carbohydrates, fats, and other nutrients listed on the label are based on one serving of the food. Many foods contain more than one serving per package.  Check the total grams (g) of carbohydrates in one serving. You can calculate the number of servings of carbohydrates in one serving by dividing the total carbohydrates by 15. For example, if a food has 30 g of total carbohydrates, it would be equal to 2 servings of carbohydrates.  Check the number of grams (g) of saturated and trans fats in one serving. Choose foods that have low or no amount of these fats.  Check the number of milligrams (mg) of sodium in one serving. Most people should limit total sodium intake to less than 2,300 mg per day.  Always check the nutrition information of foods labeled as "low-fat" or "nonfat". These foods may be higher in added sugar or refined carbohydrates and should be avoided.  Talk to your dietitian to identify your daily goals for nutrients listed on the label. Shopping  Avoid buying canned, premade, or processed foods. These foods tend to be high in fat, sodium, and added sugar.  Shop around the outside edge of the grocery store. This includes fresh fruits and vegetables, bulk grains, fresh meats, and fresh dairy. Cooking  Use  low-heat cooking methods, such as baking, instead of high-heat cooking methods like deep frying.  Cook using healthy oils, such as olive, canola, or sunflower oil.  Avoid cooking with butter, cream, or high-fat meats. Meal planning  Eat meals and snacks regularly, preferably at the same times every day. Avoid going long periods of time without eating.  Eat foods high in fiber, such as fresh fruits, vegetables, beans, and whole grains. Talk to your dietitian about how many servings of carbohydrates you can eat at each meal.  Eat 4-6 ounces of lean protein each day, such as lean meat, chicken, fish, eggs, or tofu. 1 ounce is equal to 1 ounce of meat, chicken, or fish, 1 egg, or 1/4 cup of tofu.  Eat some foods each day that contain healthy fats, such as avocado, nuts, seeds, and fish. Lifestyle   Check your blood glucose regularly.  Exercise at least 30 minutes 5 or more days each week, or as told by your health care provider.  Take medicines as told by your health care provider.  Do not use any products that contain nicotine or tobacco, such as cigarettes and e-cigarettes. If you need help quitting, ask your health care provider.  Work with a Social worker or diabetes educator to identify strategies to manage stress and any emotional and social challenges. What are some questions to ask my health care provider?  Do I need to meet with a diabetes educator?  Do I need to meet with a dietitian?  What number can I call if I have questions?  When are the best times to check my blood glucose? Where to find more information:  American Diabetes Association: diabetes.org/food-and-fitness/food  Academy of Nutrition and Dietetics: PokerClues.dk  [employer] of Diabetes and Digestive and Kidney Diseases (NIH): ContactWire.be Summary  A healthy meal plan will  help you control your blood glucose and maintain a healthy lifestyle.  Working with a diet and nutrition specialist (dietitian) can help you make a meal plan that is best for you.  Keep in mind that carbohydrates and alcohol have immediate effects on your blood glucose levels. It is important to count  carbohydrates and to use alcohol carefully. This information is not intended to replace advice given to you by your health care provider. Make sure you discuss any questions you have with your health care provider. Document Released: 02/12/2005 Document Revised: 06/22/2016 Document Reviewed: 06/22/2016 Elsevier Interactive Patient Education  2018 [employer].  IF you received an x-ray today, you will receive an invoice from [facility]. Please contact [facility] at [phone] with questions or concerns regarding your invoice.   IF you received labwork today, you will receive an invoice from [facility]. Please contact [facility] at [phone] with questions or concerns regarding your invoice.   Our billing staff will not be able to assist you with questions regarding bills from these companies.  You will be contacted with the lab results as soon as they are available. The fastest way to get your results is to activate your My Chart account. Instructions are located on the last page of this paperwork. If you have not heard from [place] regarding the results in 2 weeks, please contact this office.

## 2018-02-07 NOTE — Progress Notes (Signed)
MRN: [id number] DOB: [birth year]  Subjective:   Angela Chambers is a 77 y.o. female presenting for follow up on chronic medical conditions, to reestablish care, and referral for DEXA.   HTN: Dx made years ago. Followed by cardiology once a year.  Currently managed with losartan 100 mg daily, Toprol 50 mg daily, amlodipine 5 mg daily, and HCTZ 25 mg daily.  Patient check bp occassionally checking blood pressure at home, range is 564-332 systolic. Denies lightheadedness, dizziness, chronic headache, double vision, chest pain, shortness of breath, heart racing, palpitations, nausea, vomiting, abdominal pain, hematuria, lower leg swelling. Lifestyle: Diet consists of protein (chicken, beef, fish), vegetables, and fruits. Downfall is bread.  She has been eating more bread recently.  Avoiding excessive salt intake. Trying to exercise on a regular basis: goes to the [facility] and does elliptical and treadmill 3 x a week.  Denies smoking and alcohol ise.   T2DM: Dx was made ~10 years ago.  Patient is currently managed with metformin 565m BID.  Last seen 3 months ago in office and A1c was 7.3.  Could not tolerated 10040mBID, due to diarrhea. Denies adverse effects including metallic taste, hypoglycemia, nausea, vomiting. Patient is not checking home blood sugars. Home blood sugar records: patient does not check sugars. Current symptoms include none. Patient denies foot ulcerations, increased appetite, nausea, paresthesia of the feet, polydipsia, polyuria, visual disturbances and vomiting. Patient is checking their feet daily. No foot concerns. Last diabetic eye exam eye exam 02/09/17. Followed closely by opthalmology as pt has glaucoma. Known diabetic complications: none. Immunizations: Flu vaccine: 2018, shingles: 2018, pneumococal vaccine: 10/27/2006 and 11/27/2013.  HLRJJ:[id number] with atorvastatin 20 mg daily.   Osteopenia: Last DEXA was 02/05/16. Gets a fair amount of dairy and fish in her diet.  Eats cheese.Drinks milk occasionally. Takes daily multivitamin with Ca and vitamin D.   Angela Chambers has a current medication list which includes the following prescription(s): amlodipine, aspirin, atorvastatin, co-enzyme q-10, dorzolamide-timolol, hydrochlorothiazide, losartan, metformin, metoprolol succinate, multivitamin, PRESCRIPTION MEDICATION, and travoprost (bak free). Also is allergic to lisinopril and metformin and related.  ArKeiandrahas a past medical history of Arthritis, Cataract, Diabetes mellitus, Fibroids, Glaucoma, Hyperlipidemia, Hypertension (07 /2013 ), Obesity (BMI 30.0-34.9), Osteoporosis, and Palpitations. 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   Objective:   Vitals: BP 116/62   Pulse 80   Temp 98.6 F (37 C) (Oral)   Resp 18   Ht 5' 5.35" (1.66 m)   Wt 190 lb (86.2 kg)   LMP  (LMP Unknown)   SpO2 98%   BMI 31.28 kg/m   Physical Exam  Constitutional: She is oriented to person, place, and time. She appears well-developed and well-nourished. No distress.  HENT:  Head: Normocephalic and atraumatic.  Mouth/Throat: Uvula is midline, oropharynx is clear and moist and mucous membranes are normal.  Eyes: Pupils are equal, round, and reactive to light. Conjunctivae and EOM are normal.  Neck: Normal range of motion.  Cardiovascular: Normal rate, regular rhythm, normal heart sounds and intact distal pulses.  Pulmonary/Chest: Effort normal and breath sounds normal. She has no wheezes. She has no rhonchi. She has no rales.  Musculoskeletal:       Right lower leg: She exhibits no swelling.  Left lower leg: She  exhibits no swelling.  Neurological: She is alert and oriented to person, place, and time.  Skin: Skin is warm and dry.  Psychiatric: She has a normal mood and affect.  Vitals reviewed.    BP Readings from Last 3 Encounters:  02/07/18 116/62  11/10/17 138/72  08/24/17 120/70    Results for orders placed or performed in visit on 02/07/18 (from the past 24 hour(s))  POCT glycosylated hemoglobin (Hb A1C)     Status: Abnormal   Collection Time: 02/07/18 10:37 AM  Result Value Ref Range   Hemoglobin A1C 7.9 (A) 4.0 - 5.6 %   HbA1c POC (<> result, manual entry)     HbA1c, POC (prediabetic range)     HbA1c, POC (controlled diabetic range)      Diabetic Foot Exam - Simple   Simple Foot Form Visual Inspection No deformities, no ulcerations, no other skin breakdown bilaterally:  Yes See comments:  Yes Sensation Testing Intact to touch and monofilament testing bilaterally:  Yes Pulse Check Posterior Tibialis and Dorsalis pulse intact bilaterally:  Yes Comments Right top foot old sore      Assessment and Plan :  1. Type 2 diabetes mellitus without complication, without long-term current use of insulin (HCC) A1c has increased from 7.3 to 7.9.  Patient does admit to eating more bread recently.  She is also only taking metformin 500 mg twice daily.  Cannot tolerate metformin IR 1000 mg twice daily due to diarrhea.  Recommended we start metformin XR and taper up to a total of 2000 mg daily.  Patient agrees with this plan.  She is also motivated to work on dietary changes such as decreasing carbs.  Given Rx for glucometer.  Recommend she check fasting blood sugar and once after largest meal the day.  Fasting blood sugar goal is 90-110.  Plan to follow-up in 3 months for reevaluation. - CMP14+EGFR - TSH - POCT glycosylated hemoglobin (Hb A1C) - Urinalysis, dipstick only - Microalbumin/Creatinine Ratio, Urine - metFORMIN (GLUCOPHAGE XR) 500 MG 24 hr tablet; Take 4 tablets (2,000 mg total)  by mouth daily with breakfast.  Dispense: 360 tablet; Refill: 0 - Blood Glucose Monitoring Suppl (BLOOD GLUCOSE MONITOR SYSTEM) w/Device KIT; 1 Units by Does not apply route 2 (two) times daily.  Dispense: 1 each; Refill: 0  2. Essential hypertension Controlled.  Follow-up with cardiology as planned.  3. Hyperlipidemia, unspecified hyperlipidemia type 4. Obesity (BMI 30.0-34.9) 5. Osteopenia, unspecified location Patient appears to get a good amount of calcium and vitamin D within her diet, she also supplements with daily multivitamin.  No falls in the past year.  Discussed fall prevention in the home.  Given educational material on bone health and preventing osteoporosis.  Plan for repeat bone density. - DG Bone Density; Future - VITAMIN D 25 Hydroxy (Vit-D Deficiency, Fractures)  6. Need for influenza vaccination - Flu vaccine HIGH DOSE PF (Fluzone High dose)  Tenna Delaine, PA-C  [facility] [place] 02/07/2018 10:40 AM

## 2018-02-08 LAB — URINALYSIS, DIPSTICK ONLY
Bilirubin, UA: NEGATIVE
GLUCOSE, UA: NEGATIVE
KETONES UA: NEGATIVE
Nitrite, UA: NEGATIVE
PROTEIN UA: NEGATIVE
RBC, UA: NEGATIVE
Specific Gravity, UA: 1.021 (ref 1.005–1.030)
UUROB: 0.2 mg/dL (ref 0.2–1.0)
pH, UA: 5.5 (ref 5.0–7.5)

## 2018-02-08 LAB — CMP14+EGFR
ALT: 15 IU/L (ref 0–32)
AST: 12 IU/L (ref 0–40)
Albumin/Globulin Ratio: 2.1 (ref 1.2–2.2)
Albumin: 4.4 g/dL (ref 3.5–4.8)
Alkaline Phosphatase: 101 IU/L (ref 39–117)
BUN / CREAT RATIO: 29 — AB (ref 12–28)
BUN: 27 mg/dL (ref 8–27)
Bilirubin Total: 0.5 mg/dL (ref 0.0–1.2)
CO2: 24 mmol/L (ref 20–29)
Calcium: 9.9 mg/dL (ref 8.7–10.3)
Chloride: 102 mmol/L (ref 96–106)
Creatinine, Ser: 0.92 mg/dL (ref 0.57–1.00)
GFR calc Af Amer: 69 mL/min/{1.73_m2} (ref >59)
GFR calc non Af Amer: 60 mL/min/{1.73_m2} (ref >59)
GLUCOSE: 168 mg/dL — AB (ref 65–99)
Globulin, Total: 2.1 g/dL (ref 1.5–4.5)
Potassium: 4.2 mmol/L (ref 3.5–5.2)
Sodium: 142 mmol/L (ref 134–144)
Total Protein: 6.5 g/dL (ref 6.0–8.5)

## 2018-02-08 LAB — MICROALBUMIN / CREATININE URINE RATIO
Creatinine, Urine: 114.8 mg/dL
MICROALBUM., U, RANDOM: 4.1 ug/mL
Microalb/Creat Ratio: 3.6 mg/g creat (ref 0.0–30.0)

## 2018-02-08 LAB — VITAMIN D 25 HYDROXY (VIT D DEFICIENCY, FRACTURES): Vit D, 25-Hydroxy: 42.3 ng/mL (ref 30.0–100.0)

## 2018-02-08 LAB — TSH: TSH: 2.94 u[IU]/mL (ref 0.450–4.500)

## 2018-03-01 ENCOUNTER — Encounter: Payer: Self-pay | Admitting: Physician Assistant

## 2018-03-11 ENCOUNTER — Other Ambulatory Visit: Payer: Self-pay | Admitting: Cardiology

## 2018-03-17 ENCOUNTER — Ambulatory Visit
Admission: RE | Admit: 2018-03-17 | Discharge: 2018-03-17 | Disposition: A | Discharge status: Home / Self Care | Payer: Medicare Other | Source: Ambulatory Visit | Attending: Physician Assistant | Admitting: Physician Assistant

## 2018-03-17 DIAGNOSIS — M8580 Other specified disorders of bone density and structure, unspecified site: Secondary | ICD-10-CM

## 2018-04-23 ENCOUNTER — Other Ambulatory Visit: Payer: Self-pay | Admitting: Cardiology

## 2018-05-10 ENCOUNTER — Ambulatory Visit: Payer: Self-pay

## 2018-05-10 NOTE — Telephone Encounter (Signed)
Pt. Reports she started having nausea and dizziness yesterday. Has vomited x 1. Feels like the dizziness started first. No availability at [place] today. Instructed pt. To go to UC for evaluation. Verbalizes understanding.  Reason for Disposition . Fever present > 3 days (72 hours)  Answer Assessment - Initial Assessment Questions 1. NAUSEA SEVERITY: "How bad is the nausea?" (e.g., mild, moderate, severe; dehydration, weight loss)   - MILD: loss of appetite without change in eating habits   - MODERATE: decreased oral intake without significant weight loss, dehydration, or malnutrition   - SEVERE: inadequate caloric or fluid intake, significant weight loss, symptoms of dehydration     Moderate 2. ONSET: "When did the nausea begin?"     Yesterday 3. VOMITING: "Any vomiting?" If so, ask: "How many times today?"     Vomited once 4. RECURRENT SYMPTOM: "Have you had nausea before?" If so, ask: "When was the last time?" "What happened that time?"     Yes - a long time ago 5. CAUSE: "What do you think is causing the nausea?"     Unsure 6. PREGNANCY: "Is there any chance you are pregnant?" (e.g., unprotected intercourse, missed birth control pill, broken condom)     No  Protocols used: NAUSEA-A-AH

## 2018-05-17 ENCOUNTER — Other Ambulatory Visit: Payer: Self-pay

## 2018-05-17 ENCOUNTER — Encounter: Payer: Self-pay | Admitting: Emergency Medicine

## 2018-05-17 ENCOUNTER — Ambulatory Visit: Payer: Medicare Other | Admitting: Emergency Medicine

## 2018-05-17 VITALS — BP 122/74 | HR 81 | Temp 97.9°F | Resp 18 | Ht 65.35 in | Wt 189.8 lb

## 2018-05-17 DIAGNOSIS — E785 Hyperlipidemia, unspecified: Secondary | ICD-10-CM

## 2018-05-17 DIAGNOSIS — E119 Type 2 diabetes mellitus without complications: Secondary | ICD-10-CM | POA: Diagnosis not present

## 2018-05-17 DIAGNOSIS — I10 Essential (primary) hypertension: Secondary | ICD-10-CM | POA: Diagnosis not present

## 2018-05-17 DIAGNOSIS — I119 Hypertensive heart disease without heart failure: Secondary | ICD-10-CM

## 2018-05-17 LAB — POCT GLYCOSYLATED HEMOGLOBIN (HGB A1C): Hemoglobin A1C: 8.2 % — AB (ref 4.0–5.6)

## 2018-05-17 LAB — GLUCOSE, POCT (MANUAL RESULT ENTRY): POC Glucose: 156 mg/dl — AB (ref 70–99)

## 2018-05-17 MED ORDER — GLIPIZIDE 5 MG PO TABS: 5.0000 mg | ORAL_TABLET | Freq: Every day | ORAL | 3 refills | Status: DC

## 2018-05-17 NOTE — Progress Notes (Signed)
Angela Chambers 77 y.o.   Chief Complaint  Patient presents with  . Medication Problem    metformin  hci  . Cough    X 3 weeks- feel better now   Lab Results  Component Value Date   HGBA1C 7.9 (A) 02/07/2018   BP Readings from Last 3 Encounters:  05/17/18 122/74  02/07/18 116/62  11/10/17 138/72    HISTORY OF PRESENT ILLNESS: This is a 77 y.o. female With history of diabetes here for follow-up.  First visit with me.  Taking metformin XR but side effects forced her to go back to regular metformin 500 mg twice a day.  No side effects at this dose.  Today she has no complaints or medical concerns.  Has a history of hypertension on multiple medications.  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  Takes baby aspirin every day along with Lipitor, amlodipine, losartan, metoprolol, and HCTZ. HPI   Prior to Admission medications   Medication Sig Start Date End Date Taking? Authorizing Provider  amLODipine (NORVASC) 5 MG tablet Take 1 tablet (5 mg total) by mouth daily. 05/12/17  Yes [name], PA  aspirin 81 MG tablet Take 81 mg by mouth daily.   Yes [provider]  atorvastatin (LIPITOR) 20 MG tablet Take 1 tablet (20 mg total) by mouth daily. 08/24/17  Yes Jeffery, Chelle, PA  Blood Glucose Monitoring Suppl (BLOOD GLUCOSE MONITOR SYSTEM) w/Device KIT 1 Units by Does not apply route 2 (two) times daily. 02/07/18  Yes Tenna Delaine D, PA-C  Co-Enzyme Q-10 30 MG CAPS Take 30 mg by mouth daily.   Yes [provider]  dorzolamide-timolol (COSOPT) 22.3-6.8 MG/ML ophthalmic solution INSTILL 1 DROP INTO BOTH EYES TWICE A DAY 09/23/17  Yes [provider]  hydrochlorothiazide (HYDRODIURIL) 25 MG tablet Take 1 tablet (25 mg total) by mouth daily. NEED OV. 04/25/18  Yes Leonie Man, MD  losartan (COZAAR) 100 MG tablet Take 1 tablet (100 mg total) by mouth daily. 05/12/17  Yes Jeffery, Domingo Mend, PA  metFORMIN (GLUCOPHAGE) 500  MG tablet Take 500 mg by mouth 2 (two) times daily with a meal.   Yes [provider]  metoprolol succinate (TOPROL-XL) 50 MG 24 hr tablet TAKE 1 TABLET(50 MG) BY MOUTH DAILY 03/11/18  Yes Leonie Man, MD  Multiple Vitamin (MULTIVITAMIN) tablet Take 1 tablet by mouth daily.   Yes [provider]  PRESCRIPTION MEDICATION Timolol 0.05 % taking 1 drop to each eye every am   Yes [provider]  Travoprost, BAK Free, (TRAVATAN) 0.004 % SOLN ophthalmic solution Place 1 drop into both eyes at bedtime.   Yes [provider]  metFORMIN (GLUCOPHAGE XR) 500 MG 24 hr tablet Take 4 tablets (2,000 mg total) by mouth daily with breakfast. Patient not taking: Reported on 05/17/2018 02/07/18   Tenna Delaine D, PA-C    Allergies  Allergen Reactions  . Lisinopril Cough  . Metformin And Related     Diarrhea on 1067m BID, tolerates 500 mg BID    Patient Active Problem List   Diagnosis Date Noted  . Abnormal stress echo EKG portion with normal echo -> imaging confirmed the correct with normal coronaries 06/21/2015  . Exertional chest pain 06/07/2015  . OA (osteoarthritis) of knee 09/25/2013  . Obesity (BMI 30.0-34.9)   . Palpitations   . Hypertensive heart disease without CHF 11/30/2011  . Hyperlipidemia 08/25/2011  . Other and combined forms of senile cataract 07/04/2011  .  Primary open angle glaucoma of both eyes, severe stage 07/04/2011  . Diabetes mellitus type 2, uncomplicated ([facility]) 22/48/2500  . PERSONAL HISTORY OF COLONIC POLYPS 02/22/2009    Past Medical History:  Diagnosis Date  . Arthritis   . Cataract   . Diabetes mellitus   . Fibroids   . Glaucoma   . Hyperlipidemia   . Hypertension 07 /2013    WITH EF GREATER THAN 55 % WITH MILD MR BUT NO PROLASPE ,ESSENTIALLY NORMAL DONE TO EVALUATE PALPITATIONS .  Marland Kitchen Obesity (BMI 30.0-34.9)   . Osteoporosis   . Palpitations    CONTROLLED ON BETA BLOCKER    Past Surgical History:  Procedure  Laterality Date  . ABDOMINAL HYSTERECTOMY  10/18/ 1982   TAH-BSO for fibroids and adenomatous hyperplasia  . CARDIAC CATHETERIZATION  08/2009   NONOBSTRUCTIVE CAD WITH 20% IN THE LAD AND 20 % IN THE RCA NORMAL EF 65%  . CARDIAC CATHETERIZATION N/A 07/05/2015   Procedure: Left Heart Cath and Coronary Angiography;  Surgeon: Leonie Man, MD;  Location: [facility];  Service: Cardiovascular: Angiographically minimal CAD. EF 60-65%  . CPET-MET Test (Cardiopulmonary Exercise Test)  03/06/2013   Adequate effort at 1.11. Peak VO2 12.3 is 86% in the normal range. Heart rate was just outside the normal range target being 127 beats a minute but this was 86% of projected. She did have an ischemic pattern after the anterograde threshold with increased heart rate and blunting of stroke volume during the last 3 minutes of exercise, but was asymptomatic.  Marland Kitchen JOINT REPLACEMENT  09/05/2009   L knee  . KNEE SURGERY  1984   per pt screws in R knee  . NM MYOVIEW LTD  06/2015   FALSE + GXT - Images Accurate:  TM - 6 min, 7 METs (93% MPHR) --> chest discomfort, notable ST depressions => Hight Risk Duke TM Score, but NO scintigraphic evidence of ischemia or infarction. EF 74$  . 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 TRANSTHORACIC ECHOCARDIOGRAM  July 2013   EF greater than 55%, mild MR no prolapse.  Normal  . TUBAL LIGATION      Social History   Socioeconomic History  . Marital status: Married    Spouse name: Not on file  . Number of children: 3  . Years of education: Not on file  . Highest education level: Some college, no degree  Occupational History  . Occupation: retired  Scientific laboratory technician  . Financial resource strain: Not hard at all  . Food insecurity:    Worry: Never true    Inability: Never true  . Transportation needs:    Medical: No    Non-medical: No   Tobacco Use  . Smoking status: Former Smoker    Packs/day: 0.50    Years: 20.00    Pack years: 10.00    Types: Cigarettes    Last attempt to quit: 07/13/1981    Years since quitting: 36.8  . Smokeless tobacco: Never Used  Substance and Sexual Activity  . Alcohol use: No  . Drug use: No  . Sexual activity: Yes  Lifestyle  . Physical activity:    Days per week: 3 days    Minutes per session: 30 min  . Stress: Not at all  Relationships  . Social connections:    Talks on phone: More than three  times a week    Gets together: More than three times a week    Attends religious service: More than 4 times per year    Active member of club or organization: Yes    Attends meetings of clubs or organizations: More than 4 times per year    Relationship status: Married  . Intimate partner violence:    Fear of current or ex partner: No    Emotionally abused: No    Physically abused: No    Forced sexual activity: No  Other Topics Concern  . Not on file  Social History Narrative   SHE IS MARRIED ,MOTHER OF 4, GRANDMOTHER OF 6. SHE DOES EXERCISE BUT SOMEWHAT LIMITED BECAUSE OF HER KNEE (S/P SURGERY 2015).   SHE QUIT SMOKING IN 1980 AND SHE DOES NOT DRINK    Family History  Problem Relation Age of Onset  . Diabetes Mother   . Heart disease Mother   . Hyperlipidemia Mother   . Diabetes Sister   . Hyperlipidemia Sister   . Hypertension Sister   . Diabetes Brother   . Hyperlipidemia Brother   . Prostate cancer Son        prostate  . Hyperlipidemia Sister      Review of Systems  Constitutional: Negative.  Negative for chills, fever and weight loss.  HENT: Negative.  Negative for congestion, hearing loss and sore throat.   Eyes: Negative.  Negative for blurred vision and double vision.  Respiratory: Negative.  Negative for cough and shortness of breath.   Cardiovascular: Positive for palpitations. Negative for chest pain, claudication and leg swelling.  Gastrointestinal: Negative.   Negative for abdominal pain, diarrhea, nausea and vomiting.  Skin: Negative.  Negative for rash.  Neurological: Negative.  Negative for dizziness, sensory change, speech change, focal weakness and headaches.  Endo/Heme/Allergies: Negative.   All other systems reviewed and are negative.    Vitals:   05/17/18 1436  BP: 122/74  Pulse: 81  Resp: 18  Temp: 97.9 F (36.6 C)  SpO2: 97%    Physical Exam Vitals signs reviewed.  Constitutional:      Appearance: Normal appearance.  HENT:     Head: Normocephalic and atraumatic.     Mouth/Throat:     Mouth: Mucous membranes are moist.     Pharynx: Oropharynx is clear.  Eyes:     Extraocular Movements: Extraocular movements intact.     Conjunctiva/sclera: Conjunctivae normal.     Pupils: Pupils are equal, round, and reactive to light.  Neck:     Musculoskeletal: Normal range of motion. No muscular tenderness.     Vascular: No carotid bruit.  Cardiovascular:     Rate and Rhythm: Normal rate and regular rhythm.     Pulses: Normal pulses.     Heart sounds: Normal heart sounds.  Pulmonary:     Effort: Pulmonary effort is normal.     Breath sounds: Normal breath sounds.  Abdominal:     Palpations: Abdomen is soft.     Tenderness: There is no abdominal tenderness.  Musculoskeletal: Normal range of motion.  Lymphadenopathy:     Cervical: No cervical adenopathy.  Skin:    General: Skin is warm and dry.     Capillary Refill: Capillary refill takes less than 2 seconds.  Neurological:     General: No focal deficit present.     Mental Status: She is alert and oriented to person, place, and time.     Sensory: No sensory deficit.  Motor: No weakness.  Psychiatric:        Mood and Affect: Mood normal.        Behavior: Behavior normal.    Results for orders placed or performed in visit on 05/17/18 (from the past 24 hour(s))  POCT glucose (manual entry)     Status: Abnormal   Collection Time: 05/17/18  3:12 PM  Result Value Ref  Range   POC Glucose 156 (A) 70 - 99 mg/dl  POCT glycosylated hemoglobin (Hb A1C)     Status: Abnormal   Collection Time: 05/17/18  3:17 PM  Result Value Ref Range   Hemoglobin A1C 8.2 (A) 4.0 - 5.6 %   HbA1c POC (<> result, manual entry)     HbA1c, POC (prediabetic range)     HbA1c, POC (controlled diabetic range)      A total of 40 minutes was spent in the room with the patient, greater than 50% of which was in counseling/coordination of care regarding diabetes, treatment, new medications, nutrition, and need for follow-up in 3 months.   ASSESSMENT & PLAN: Diabetes mellitus type 2, uncomplicated (HCC) Uncontrolled diabetes.  Hemoglobin A1c today higher than last time at 8.2.  Will continue metformin 500 mg twice a day and add glipizide 5 mg with breakfast once a day.  Follow-up in 3 months.  Jacklyn was seen today for medication problem and cough.  Diagnoses and all orders for this visit:  Type 2 diabetes mellitus without complication, without long-term current use of insulin (HCC) -     POCT glucose (manual entry) -     POCT glycosylated hemoglobin (Hb A1C)  Other orders -     glipiZIDE (GLUCOTROL) 5 MG tablet; Take 1 tablet (5 mg total) by mouth daily before breakfast.    Patient Instructions       If you have lab work done today you will be contacted with your lab results within the next 2 weeks.  If you have not heard from [place] then please contact us. The fastest way to get your results is to register for My Chart.   IF you received an x-ray today, you will receive an invoice from [facility]. Please contact [facility] at [phone] with questions or concerns regarding your invoice.   IF you received labwork today, you will receive an invoice from [facility]. Please contact [facility] at [phone] with questions or concerns regarding your invoice.   Our billing staff will not be able to assist you with questions regarding bills from these  companies.  You will be contacted with the lab results as soon as they are available. The fastest way to get your results is to activate your My Chart account. Instructions are located on the last page of this paperwork. If you have not heard from [place] regarding the results in 2 weeks, please contact this office.     Diabetes Mellitus and Nutrition When you have diabetes (diabetes mellitus), it is very important to have healthy eating habits because your blood sugar (glucose) levels are greatly affected by what you eat and drink. Eating healthy foods in the appropriate amounts, at about the same times every day, can help you:  Control your blood glucose.  Lower your risk of heart disease.  Improve your blood pressure.  Reach or maintain a healthy weight.  Every person with diabetes is different, and each person has different needs for a meal plan. Your health care provider may recommend that you work with a diet and nutrition  specialist (dietitian) to make a meal plan that is best for you. Your meal plan may vary depending on factors such as:  The calories you need.  The medicines you take.  Your weight.  Your blood glucose, blood pressure, and cholesterol levels.  Your activity level.  Other health conditions you have, such as heart or kidney disease.  How do carbohydrates affect me? Carbohydrates affect your blood glucose level more than any other type of food. Eating carbohydrates naturally increases the amount of glucose in your blood. Carbohydrate counting is a method for keeping track of how many carbohydrates you eat. Counting carbohydrates is important to keep your blood glucose at a healthy level, especially if you use insulin or take certain oral diabetes medicines. It is important to know how many carbohydrates you can safely have in each meal. This is different for every person. Your dietitian can help you calculate how many carbohydrates you should have at each meal and  for snack. Foods that contain carbohydrates include:  Bread, cereal, rice, pasta, and crackers.  Potatoes and corn.  Peas, beans, and lentils.  Milk and yogurt.  Fruit and juice.  Desserts, such as cakes, cookies, ice cream, and candy.  How does alcohol affect me? Alcohol can cause a sudden decrease in blood glucose (hypoglycemia), especially if you use insulin or take certain oral diabetes medicines. Hypoglycemia can be a life-threatening condition. Symptoms of hypoglycemia (sleepiness, dizziness, and confusion) are similar to symptoms of having too much alcohol. If your health care provider says that alcohol is safe for you, follow these guidelines:  Limit alcohol intake to no more than 1 drink per day for nonpregnant women and 2 drinks per day for men. One drink equals 12 oz of beer, 5 oz of wine, or 1 oz of hard liquor.  Do not drink on an empty stomach.  Keep yourself hydrated with water, diet soda, or unsweetened iced tea.  Keep in mind that regular soda, juice, and other mixers may contain a lot of sugar and must be counted as carbohydrates.  What are tips for following this plan? Reading food labels  Start by checking the serving size on the label. The amount of calories, carbohydrates, fats, and other nutrients listed on the label are based on one serving of the food. Many foods contain more than one serving per package.  Check the total grams (g) of carbohydrates in one serving. You can calculate the number of servings of carbohydrates in one serving by dividing the total carbohydrates by 15. For example, if a food has 30 g of total carbohydrates, it would be equal to 2 servings of carbohydrates.  Check the number of grams (g) of saturated and trans fats in one serving. Choose foods that have low or no amount of these fats.  Check the number of milligrams (mg) of sodium in one serving. Most people should limit total sodium intake to less than 2,300 mg per day.  Always  check the nutrition information of foods labeled as "low-fat" or "nonfat". These foods may be higher in added sugar or refined carbohydrates and should be avoided.  Talk to your dietitian to identify your daily goals for nutrients listed on the label. Shopping  Avoid buying canned, premade, or processed foods. These foods tend to be high in fat, sodium, and added sugar.  Shop around the outside edge of the grocery store. This includes fresh fruits and vegetables, bulk grains, fresh meats, and fresh dairy. Cooking  Use low-heat cooking  methods, such as baking, instead of high-heat cooking methods like deep frying.  Cook using healthy oils, such as olive, canola, or sunflower oil.  Avoid cooking with butter, cream, or high-fat meats. Meal planning  Eat meals and snacks regularly, preferably at the same times every day. Avoid going long periods of time without eating.  Eat foods high in fiber, such as fresh fruits, vegetables, beans, and whole grains. Talk to your dietitian about how many servings of carbohydrates you can eat at each meal.  Eat 4-6 ounces of lean protein each day, such as lean meat, chicken, fish, eggs, or tofu. 1 ounce is equal to 1 ounce of meat, chicken, or fish, 1 egg, or 1/4 cup of tofu.  Eat some foods each day that contain healthy fats, such as avocado, nuts, seeds, and fish. Lifestyle   Check your blood glucose regularly.  Exercise at least 30 minutes 5 or more days each week, or as told by your health care provider.  Take medicines as told by your health care provider.  Do not use any products that contain nicotine or tobacco, such as cigarettes and e-cigarettes. If you need help quitting, ask your health care provider.  Work with a Social worker or diabetes educator to identify strategies to manage stress and any emotional and social challenges. What are some questions to ask my health care provider?  Do I need to meet with a diabetes educator?  Do I need  to meet with a dietitian?  What number can I call if I have questions?  When are the best times to check my blood glucose? Where to find more information:  American Diabetes Association: diabetes.org/food-and-fitness/food  Academy of Nutrition and Dietetics: PokerClues.dk  [employer] of Diabetes and Digestive and Kidney Diseases (NIH): ContactWire.be Summary  A healthy meal plan will help you control your blood glucose and maintain a healthy lifestyle.  Working with a diet and nutrition specialist (dietitian) can help you make a meal plan that is best for you.  Keep in mind that carbohydrates and alcohol have immediate effects on your blood glucose levels. It is important to count carbohydrates and to use alcohol carefully. This information is not intended to replace advice given to you by your health care provider. Make sure you discuss any questions you have with your health care provider. Document Released: 02/12/2005 Document Revised: 06/22/2016 Document Reviewed: 06/22/2016 Elsevier Interactive Patient Education  2018 [employer].      Agustina Caroli, MD [facility]

## 2018-05-17 NOTE — Patient Instructions (Addendum)
   If you have lab work done today you will be contacted with your lab results within the next 2 weeks.  If you have not heard from us then please contact us. The fastest way to get your results is to register for My Chart.   IF you received an x-ray today, you will receive an invoice from [facility]. Please contact [facility] at [phone] with questions or concerns regarding your invoice.   IF you received labwork today, you will receive an invoice from [facility]. Please contact [facility] at [phone] with questions or concerns regarding your invoice.   Our billing staff will not be able to assist you with questions regarding bills from these companies.  You will be contacted with the lab results as soon as they are available. The fastest way to get your results is to activate your My Chart account. Instructions are located on the last page of this paperwork. If you have not heard from us regarding the results in 2 weeks, please contact this office.     Diabetes Mellitus and Nutrition When you have diabetes (diabetes mellitus), it is very important to have healthy eating habits because your blood sugar (glucose) levels are greatly affected by what you eat and drink. Eating healthy foods in the appropriate amounts, at about the same times every day, can help you:  Control your blood glucose.  Lower your risk of heart disease.  Improve your blood pressure.  Reach or maintain a healthy weight.  Every person with diabetes is different, and each person has different needs for a meal plan. Your health care provider may recommend that you work with a diet and nutrition specialist (dietitian) to make a meal plan that is best for you. Your meal plan may vary depending on factors such as:  The calories you need.  The medicines you take.  Your weight.  Your blood glucose, blood pressure, and cholesterol levels.  Your activity level.  Other health conditions you  have, such as heart or kidney disease.  How do carbohydrates affect me? Carbohydrates affect your blood glucose level more than any other type of food. Eating carbohydrates naturally increases the amount of glucose in your blood. Carbohydrate counting is a method for keeping track of how many carbohydrates you eat. Counting carbohydrates is important to keep your blood glucose at a healthy level, especially if you use insulin or take certain oral diabetes medicines. It is important to know how many carbohydrates you can safely have in each meal. This is different for every person. Your dietitian can help you calculate how many carbohydrates you should have at each meal and for snack. Foods that contain carbohydrates include:  Bread, cereal, rice, pasta, and crackers.  Potatoes and corn.  Peas, beans, and lentils.  Milk and yogurt.  Fruit and juice.  Desserts, such as cakes, cookies, ice cream, and candy.  How does alcohol affect me? Alcohol can cause a sudden decrease in blood glucose (hypoglycemia), especially if you use insulin or take certain oral diabetes medicines. Hypoglycemia can be a life-threatening condition. Symptoms of hypoglycemia (sleepiness, dizziness, and confusion) are similar to symptoms of having too much alcohol. If your health care provider says that alcohol is safe for you, follow these guidelines:  Limit alcohol intake to no more than 1 drink per day for nonpregnant women and 2 drinks per day for men. One drink equals 12 oz of beer, 5 oz of wine, or 1 oz of hard liquor.  Do   not drink on an empty stomach.  Keep yourself hydrated with water, diet soda, or unsweetened iced tea.  Keep in mind that regular soda, juice, and other mixers may contain a lot of sugar and must be counted as carbohydrates.  What are tips for following this plan? Reading food labels  Start by checking the serving size on the label. The amount of calories, carbohydrates, fats, and other  nutrients listed on the label are based on one serving of the food. Many foods contain more than one serving per package.  Check the total grams (g) of carbohydrates in one serving. You can calculate the number of servings of carbohydrates in one serving by dividing the total carbohydrates by 15. For example, if a food has 30 g of total carbohydrates, it would be equal to 2 servings of carbohydrates.  Check the number of grams (g) of saturated and trans fats in one serving. Choose foods that have low or no amount of these fats.  Check the number of milligrams (mg) of sodium in one serving. Most people should limit total sodium intake to less than 2,300 mg per day.  Always check the nutrition information of foods labeled as "low-fat" or "nonfat". These foods may be higher in added sugar or refined carbohydrates and should be avoided.  Talk to your dietitian to identify your daily goals for nutrients listed on the label. Shopping  Avoid buying canned, premade, or processed foods. These foods tend to be high in fat, sodium, and added sugar.  Shop around the outside edge of the grocery store. This includes fresh fruits and vegetables, bulk grains, fresh meats, and fresh dairy. Cooking  Use low-heat cooking methods, such as baking, instead of high-heat cooking methods like deep frying.  Cook using healthy oils, such as olive, canola, or sunflower oil.  Avoid cooking with butter, cream, or high-fat meats. Meal planning  Eat meals and snacks regularly, preferably at the same times every day. Avoid going long periods of time without eating.  Eat foods high in fiber, such as fresh fruits, vegetables, beans, and whole grains. Talk to your dietitian about how many servings of carbohydrates you can eat at each meal.  Eat 4-6 ounces of lean protein each day, such as lean meat, chicken, fish, eggs, or tofu. 1 ounce is equal to 1 ounce of meat, chicken, or fish, 1 egg, or 1/4 cup of tofu.  Eat some  foods each day that contain healthy fats, such as avocado, nuts, seeds, and fish. Lifestyle   Check your blood glucose regularly.  Exercise at least 30 minutes 5 or more days each week, or as told by your health care provider.  Take medicines as told by your health care provider.  Do not use any products that contain nicotine or tobacco, such as cigarettes and e-cigarettes. If you need help quitting, ask your health care provider.  Work with a counselor or diabetes educator to identify strategies to manage stress and any emotional and social challenges. What are some questions to ask my health care provider?  Do I need to meet with a diabetes educator?  Do I need to meet with a dietitian?  What number can I call if I have questions?  When are the best times to check my blood glucose? Where to find more information:  American Diabetes Association: diabetes.org/food-and-fitness/food  Academy of Nutrition and Dietetics: www.eatright.org/resources/health/diseases-and-conditions/diabetes  National Institute of Diabetes and Digestive and Kidney Diseases (NIH): www.niddk.nih.gov/health-information/diabetes/overview/diet-eating-physical-activity Summary  A healthy meal plan will   help you control your blood glucose and maintain a healthy lifestyle.  Working with a diet and nutrition specialist (dietitian) can help you make a meal plan that is best for you.  Keep in mind that carbohydrates and alcohol have immediate effects on your blood glucose levels. It is important to count carbohydrates and to use alcohol carefully. This information is not intended to replace advice given to you by your health care provider. Make sure you discuss any questions you have with your health care provider. Document Released: 02/12/2005 Document Revised: 06/22/2016 Document Reviewed: 06/22/2016 Elsevier Interactive Patient Education  2018 [employer]  

## 2018-05-17 NOTE — Assessment & Plan Note (Signed)
Uncontrolled diabetes.  Hemoglobin A1c today higher than last time at 8.2.  Will continue metformin 500 mg twice a day and add glipizide 5 mg with breakfast once a day.  Follow-up in 3 months.

## 2018-07-04 ENCOUNTER — Other Ambulatory Visit: Payer: Self-pay | Admitting: *Deleted

## 2018-07-04 ENCOUNTER — Encounter: Payer: Self-pay | Admitting: Emergency Medicine

## 2018-07-04 DIAGNOSIS — I10 Essential (primary) hypertension: Secondary | ICD-10-CM

## 2018-07-04 MED ORDER — LOSARTAN POTASSIUM 100 MG PO TABS: 100.0000 mg | ORAL_TABLET | Freq: Every day | ORAL | 3 refills | Status: DC

## 2018-07-04 MED ORDER — AMLODIPINE BESYLATE 5 MG PO TABS: 5.0000 mg | ORAL_TABLET | Freq: Every day | ORAL | 3 refills | Status: DC

## 2018-07-25 ENCOUNTER — Other Ambulatory Visit: Payer: Self-pay | Admitting: Cardiology

## 2018-07-25 ENCOUNTER — Other Ambulatory Visit: Payer: Self-pay

## 2018-07-25 MED ORDER — HYDROCHLOROTHIAZIDE 25 MG PO TABS: 25.0000 mg | ORAL_TABLET | Freq: Every day | ORAL | 0 refills | Status: DC

## 2018-08-01 ENCOUNTER — Other Ambulatory Visit: Payer: Self-pay | Admitting: Physician Assistant

## 2018-08-01 DIAGNOSIS — E119 Type 2 diabetes mellitus without complications: Secondary | ICD-10-CM

## 2018-08-13 ENCOUNTER — Other Ambulatory Visit: Payer: Self-pay | Admitting: Physician Assistant

## 2018-08-13 DIAGNOSIS — E119 Type 2 diabetes mellitus without complications: Secondary | ICD-10-CM

## 2018-08-16 ENCOUNTER — Encounter: Payer: Self-pay | Admitting: Emergency Medicine

## 2018-08-16 ENCOUNTER — Other Ambulatory Visit: Payer: Self-pay

## 2018-08-16 ENCOUNTER — Ambulatory Visit: Payer: Medicare Other | Admitting: Emergency Medicine

## 2018-08-16 VITALS — BP 128/60 | HR 82 | Temp 98.0°F | Resp 16 | Ht 66.0 in | Wt 194.2 lb

## 2018-08-16 DIAGNOSIS — E1165 Type 2 diabetes mellitus with hyperglycemia: Secondary | ICD-10-CM

## 2018-08-16 DIAGNOSIS — I152 Hypertension secondary to endocrine disorders: Secondary | ICD-10-CM

## 2018-08-16 DIAGNOSIS — E785 Hyperlipidemia, unspecified: Secondary | ICD-10-CM

## 2018-08-16 DIAGNOSIS — I10 Essential (primary) hypertension: Secondary | ICD-10-CM

## 2018-08-16 DIAGNOSIS — E1159 Type 2 diabetes mellitus with other circulatory complications: Secondary | ICD-10-CM | POA: Diagnosis not present

## 2018-08-16 DIAGNOSIS — I119 Hypertensive heart disease without heart failure: Secondary | ICD-10-CM | POA: Diagnosis not present

## 2018-08-16 DIAGNOSIS — E1169 Type 2 diabetes mellitus with other specified complication: Secondary | ICD-10-CM

## 2018-08-16 LAB — POCT GLYCOSYLATED HEMOGLOBIN (HGB A1C): Hemoglobin A1C: 7.2 % — AB (ref 4.0–5.6)

## 2018-08-16 LAB — GLUCOSE, POCT (MANUAL RESULT ENTRY): POC Glucose: 152 mg/dl — AB (ref 70–99)

## 2018-08-16 MED ORDER — HYDROCHLOROTHIAZIDE 25 MG PO TABS: 25.0000 mg | ORAL_TABLET | Freq: Every day | ORAL | 3 refills | Status: DC

## 2018-08-16 MED ORDER — METFORMIN HCL 500 MG PO TABS: 500.0000 mg | ORAL_TABLET | Freq: Two times a day (BID) | ORAL | 3 refills | Status: DC

## 2018-08-16 NOTE — Assessment & Plan Note (Signed)
Hemoglobin A1c today better than before at 7.2.  Continue present medication.  Follow-up in 3 months.

## 2018-08-16 NOTE — Patient Instructions (Addendum)
   If you have lab work done today you will be contacted with your lab results within the next 2 weeks.  If you have not heard from us then please contact us. The fastest way to get your results is to register for My Chart.   IF you received an x-ray today, you will receive an invoice from [facility]. Please contact [facility] at [phone] with questions or concerns regarding your invoice.   IF you received labwork today, you will receive an invoice from [facility]. Please contact [facility] at [phone] with questions or concerns regarding your invoice.   Our billing staff will not be able to assist you with questions regarding bills from these companies.  You will be contacted with the lab results as soon as they are available. The fastest way to get your results is to activate your My Chart account. Instructions are located on the last page of this paperwork. If you have not heard from us regarding the results in 2 weeks, please contact this office.     Diabetes Mellitus and Nutrition, Adult When you have diabetes (diabetes mellitus), it is very important to have healthy eating habits because your blood sugar (glucose) levels are greatly affected by what you eat and drink. Eating healthy foods in the appropriate amounts, at about the same times every day, can help you:  Control your blood glucose.  Lower your risk of heart disease.  Improve your blood pressure.  Reach or maintain a healthy weight. Every person with diabetes is different, and each person has different needs for a meal plan. Your health care provider may recommend that you work with a diet and nutrition specialist (dietitian) to make a meal plan that is best for you. Your meal plan may vary depending on factors such as:  The calories you need.  The medicines you take.  Your weight.  Your blood glucose, blood pressure, and cholesterol levels.  Your activity level.  Other health conditions  you have, such as heart or kidney disease. How do carbohydrates affect me? Carbohydrates, also called carbs, affect your blood glucose level more than any other type of food. Eating carbs naturally raises the amount of glucose in your blood. Carb counting is a method for keeping track of how many carbs you eat. Counting carbs is important to keep your blood glucose at a healthy level, especially if you use insulin or take certain oral diabetes medicines. It is important to know how many carbs you can safely have in each meal. This is different for every person. Your dietitian can help you calculate how many carbs you should have at each meal and for each snack. Foods that contain carbs include:  Bread, cereal, rice, pasta, and crackers.  Potatoes and corn.  Peas, beans, and lentils.  Milk and yogurt.  Fruit and juice.  Desserts, such as cakes, cookies, ice cream, and candy. How does alcohol affect me? Alcohol can cause a sudden decrease in blood glucose (hypoglycemia), especially if you use insulin or take certain oral diabetes medicines. Hypoglycemia can be a life-threatening condition. Symptoms of hypoglycemia (sleepiness, dizziness, and confusion) are similar to symptoms of having too much alcohol. If your health care provider says that alcohol is safe for you, follow these guidelines:  Limit alcohol intake to no more than 1 drink per day for nonpregnant women and 2 drinks per day for men. One drink equals 12 oz of beer, 5 oz of wine, or 1 oz of hard liquor.    Do not drink on an empty stomach.  Keep yourself hydrated with water, diet soda, or unsweetened iced tea.  Keep in mind that regular soda, juice, and other mixers may contain a lot of sugar and must be counted as carbs. What are tips for following this plan?  Reading food labels  Start by checking the serving size on the "Nutrition Facts" label of packaged foods and drinks. The amount of calories, carbs, fats, and other  nutrients listed on the label is based on one serving of the item. Many items contain more than one serving per package.  Check the total grams (g) of carbs in one serving. You can calculate the number of servings of carbs in one serving by dividing the total carbs by 15. For example, if a food has 30 g of total carbs, it would be equal to 2 servings of carbs.  Check the number of grams (g) of saturated and trans fats in one serving. Choose foods that have low or no amount of these fats.  Check the number of milligrams (mg) of salt (sodium) in one serving. Most people should limit total sodium intake to less than 2,300 mg per day.  Always check the nutrition information of foods labeled as "low-fat" or "nonfat". These foods may be higher in added sugar or refined carbs and should be avoided.  Talk to your dietitian to identify your daily goals for nutrients listed on the label. Shopping  Avoid buying canned, premade, or processed foods. These foods tend to be high in fat, sodium, and added sugar.  Shop around the outside edge of the grocery store. This includes fresh fruits and vegetables, bulk grains, fresh meats, and fresh dairy. Cooking  Use low-heat cooking methods, such as baking, instead of high-heat cooking methods like deep frying.  Cook using healthy oils, such as olive, canola, or sunflower oil.  Avoid cooking with butter, cream, or high-fat meats. Meal planning  Eat meals and snacks regularly, preferably at the same times every day. Avoid going long periods of time without eating.  Eat foods high in fiber, such as fresh fruits, vegetables, beans, and whole grains. Talk to your dietitian about how many servings of carbs you can eat at each meal.  Eat 4-6 ounces (oz) of lean protein each day, such as lean meat, chicken, fish, eggs, or tofu. One oz of lean protein is equal to: ? 1 oz of meat, chicken, or fish. ? 1 egg. ?  cup of tofu.  Eat some foods each day that contain  healthy fats, such as avocado, nuts, seeds, and fish. Lifestyle  Check your blood glucose regularly.  Exercise regularly as told by your health care provider. This may include: ? 150 minutes of moderate-intensity or vigorous-intensity exercise each week. This could be brisk walking, biking, or water aerobics. ? Stretching and doing strength exercises, such as yoga or weightlifting, at least 2 times a week.  Take medicines as told by your health care provider.  Do not use any products that contain nicotine or tobacco, such as cigarettes and e-cigarettes. If you need help quitting, ask your health care provider.  Work with a counselor or diabetes educator to identify strategies to manage stress and any emotional and social challenges. Questions to ask a health care provider  Do I need to meet with a diabetes educator?  Do I need to meet with a dietitian?  What number can I call if I have questions?  When are the best times to   check my blood glucose? Where to find more information:  American Diabetes Association: diabetes.org  Academy of Nutrition and Dietetics: www.eatright.org  National Institute of Diabetes and Digestive and Kidney Diseases (NIH): www.niddk.nih.gov Summary  A healthy meal plan will help you control your blood glucose and maintain a healthy lifestyle.  Working with a diet and nutrition specialist (dietitian) can help you make a meal plan that is best for you.  Keep in mind that carbohydrates (carbs) and alcohol have immediate effects on your blood glucose levels. It is important to count carbs and to use alcohol carefully. This information is not intended to replace advice given to you by your health care provider. Make sure you discuss any questions you have with your health care provider. Document Released: 02/12/2005 Document Revised: 12/16/2016 Document Reviewed: 06/22/2016 Elsevier Interactive Patient Education  2019 [employer]  

## 2018-08-16 NOTE — Progress Notes (Signed)
Lab Results  Component Value Date   HGBA1C 8.2 (A) 05/17/2018   BP Readings from Last 3 Encounters:  08/16/18 128/60  05/17/18 122/74  02/07/18 116/62   Lab Results  Component Value Date   CHOL 173 11/10/2017   HDL 69 11/10/2017   LDLCALC 82 11/10/2017   TRIG 112 11/10/2017   CHOLHDL 2.5 11/10/2017   Angela Chambers 78 y.o.   Chief Complaint  Patient presents with   Diabetes    3 month follow up   Medication Refill    HCTZ and METFORMIN not XR    HISTORY OF PRESENT ILLNESS: This is a 78 y.o. female with history of diabetes and hypertension and dyslipidemia here for follow-up and medication refill. Has no complaints or medical concerns today.  Compliant with medications.  HPI   Prior to Admission medications   Medication Sig Start Date End Date Taking? Authorizing Provider  amLODipine (NORVASC) 5 MG tablet Take 1 tablet (5 mg total) by mouth daily. 07/04/18  Yes SagardiaInes Bloomer, MD  aspirin 81 MG tablet Take 81 mg by mouth daily.   Yes [provider]  atorvastatin (LIPITOR) 20 MG tablet Take 1 tablet (20 mg total) by mouth daily. 08/24/17  Yes Jeffery, Chelle, PA  Blood Glucose Monitoring Suppl (BLOOD GLUCOSE MONITOR SYSTEM) w/Device KIT 1 Units by Does not apply route 2 (two) times daily. 02/07/18  Yes Tenna Delaine D, PA-C  Co-Enzyme Q-10 30 MG CAPS Take 30 mg by mouth daily.   Yes [provider]  dorzolamide-timolol (COSOPT) 22.3-6.8 MG/ML ophthalmic solution INSTILL 1 DROP INTO BOTH EYES TWICE A DAY 09/23/17  Yes [provider]  glipiZIDE (GLUCOTROL) 5 MG tablet Take 1 tablet (5 mg total) by mouth daily before breakfast. 05/17/18  Yes Leighton Brickley, Ines Bloomer, MD  hydrochlorothiazide (HYDRODIURIL) 25 MG tablet Take 1 tablet (25 mg total) by mouth daily. Please call and schedule an office visit for further refills 1st attempt 07/26/18  Yes Leonie Man, MD  losartan (COZAAR) 100 MG tablet Take 1 tablet (100 mg total) by mouth  daily. 07/04/18  Yes Horald Pollen, MD  metFORMIN (GLUCOPHAGE XR) 500 MG 24 hr tablet Take 4 tablets (2,000 mg total) by mouth daily with breakfast. 02/07/18  Yes Timmothy Euler, [place] D, PA-C  metoprolol succinate (TOPROL-XL) 50 MG 24 hr tablet TAKE 1 TABLET(50 MG) BY MOUTH DAILY 03/11/18  Yes Leonie Man, MD  Multiple Vitamin (MULTIVITAMIN) tablet Take 1 tablet by mouth daily.   Yes [provider]  PRESCRIPTION MEDICATION Timolol 0.05 % taking 1 drop to each eye every am   Yes [provider]  Travoprost, BAK Free, (TRAVATAN) 0.004 % SOLN ophthalmic solution Place 1 drop into both eyes at bedtime.   Yes [provider]  metFORMIN (GLUCOPHAGE) 500 MG tablet Take 500 mg by mouth 2 (two) times daily with a meal.    [provider]    Allergies  Allergen Reactions   Lisinopril Cough   Metformin And Related     Diarrhea on 1011m BID, tolerates 500 mg BID    Patient Active Problem List   Diagnosis Date Noted   Abnormal stress echo EKG portion with normal echo -> imaging confirmed the correct with normal coronaries 06/21/2015   Exertional chest pain 06/07/2015   OA (osteoarthritis) of knee 09/25/2013   Obesity (BMI 30.0-34.9)    Palpitations    Hypertensive heart disease without CHF 11/30/2011   Hyperlipidemia 08/25/2011   Other and combined  forms of senile cataract 07/04/2011   Primary open angle glaucoma of both eyes, severe stage 07/04/2011   Diabetes mellitus type 2, uncomplicated ([place]) 57/06/7791   PERSONAL HISTORY OF COLONIC POLYPS 02/22/2009    Past Medical History:  Diagnosis Date   Arthritis    Cataract    Diabetes mellitus    Fibroids    Glaucoma    Hyperlipidemia    Hypertension 07 /2013    WITH EF GREATER THAN 55 % WITH MILD MR BUT NO PROLASPE ,ESSENTIALLY NORMAL DONE TO EVALUATE PALPITATIONS .   Obesity (BMI 30.0-34.9)    Osteoporosis    Palpitations    CONTROLLED ON BETA BLOCKER    Past Surgical  History:  Procedure Laterality Date   ABDOMINAL HYSTERECTOMY  10/18/ 1982   TAH-BSO for fibroids and adenomatous hyperplasia   CARDIAC CATHETERIZATION  08/2009   NONOBSTRUCTIVE CAD WITH 20% IN THE LAD AND 20 % IN THE RCA NORMAL EF 65%   CARDIAC CATHETERIZATION N/A 07/05/2015   Procedure: Left Heart Cath and Coronary Angiography;  Surgeon: Leonie Man, MD;  Location: [facility];  Service: Cardiovascular: Angiographically minimal CAD. EF 60-65%   CPET-MET Test (Cardiopulmonary Exercise Test)  03/06/2013   Adequate effort at 1.11. Peak VO2 12.3 is 86% in the normal range. Heart rate was just outside the normal range target being 127 beats a minute but this was 86% of projected. She did have an ischemic pattern after the anterograde threshold with increased heart rate and blunting of stroke volume during the last 3 minutes of exercise, but was asymptomatic.   JOINT REPLACEMENT  09/05/2009   L knee   KNEE SURGERY  1984   per pt screws in R knee   NM MYOVIEW LTD  06/2015   FALSE + GXT - Images Accurate:  TM - 6 min, 7 METs (93% MPHR) --> chest discomfort, notable ST depressions => Hight Risk Duke TM Score, but NO scintigraphic evidence of ischemia or infarction. EF 74$   TOTAL KNEE ARTHROPLASTY Right 09/25/2013   Procedure: RIGHT TOTAL KNEE ARTHROPLASTY;  Surgeon: Gearlean Alf, MD;  Location: [facility];  Service: Orthopedics;  Laterality: Right;   TRANSTHORACIC ECHOCARDIOGRAM  July 2013   EF greater than 55%, mild MR no prolapse.  Normal   TUBAL LIGATION      Social History   Socioeconomic History   Marital status: Married    Spouse name: Not on file   Number of children: 3   Years of education: Not on file   Highest education level: Some college, no degree  Occupational History   Occupation: retired  Scientist, product/process development strain: Not hard at [employer] insecurity:    Worry: Never true    Inability: Never true   Transportation needs:    Medical:  No    Non-medical: No  Tobacco Use   Smoking status: Former Smoker    Packs/day: 0.50    Years: 20.00    Pack years: 10.00    Types: Cigarettes    Last attempt to quit: 07/13/1981    Years since quitting: 37.1   Smokeless tobacco: Never Used  Substance and Sexual Activity   Alcohol use: No   Drug use: No   Sexual activity: Yes  Lifestyle   Physical activity:    Days per week: 3 days    Minutes per session: 30 min   Stress: Not at all  Relationships   Social connections:  Talks on phone: More than three times a week    Gets together: More than three times a week    Attends religious service: More than 4 times per year    Active member of club or organization: Yes    Attends meetings of clubs or organizations: More than 4 times per year    Relationship status: Married   Intimate partner violence:    Fear of current or ex partner: No    Emotionally abused: No    Physically abused: No    Forced sexual activity: No  Other Topics Concern   Not on file  Social History Narrative   SHE IS MARRIED ,MOTHER OF 4, GRANDMOTHER OF 6. SHE DOES EXERCISE BUT SOMEWHAT LIMITED BECAUSE OF HER KNEE (S/P SURGERY 2015).   SHE QUIT SMOKING IN 1980 AND SHE DOES NOT [place]    Family History  Problem Relation Age of Onset   Diabetes Mother    Heart disease Mother    Hyperlipidemia Mother    Diabetes Sister    Hyperlipidemia Sister    Hypertension Sister    Diabetes Brother    Hyperlipidemia Brother    Prostate cancer Son        prostate   Hyperlipidemia Sister      Review of Systems  Constitutional: Negative.  Negative for chills and fever.  HENT: Negative.   Eyes: Negative.   Respiratory: Negative.  Negative for cough and hemoptysis.   Cardiovascular: Negative.  Negative for chest pain and palpitations.  Gastrointestinal: Negative.  Negative for abdominal pain, diarrhea, heartburn, nausea and vomiting.  Genitourinary: Negative.  Negative for dysuria.    Musculoskeletal: Negative.   Skin: Negative.   Neurological: Negative.  Negative for dizziness and headaches.  Endo/Heme/Allergies: Negative.   All other systems reviewed and are negative.    Vitals:   08/16/18 0844  BP: 128/60  Pulse: 82  Resp: 16  Temp: 98 F (36.7 C)  SpO2: 98%     Physical Exam Vitals signs reviewed.  Constitutional:      Appearance: Normal appearance.  HENT:     Head: Normocephalic and atraumatic.     Nose: Nose normal.     Mouth/Throat:     Mouth: Mucous membranes are moist.     Pharynx: Oropharynx is clear.  Eyes:     Extraocular Movements: Extraocular movements intact.     Conjunctiva/sclera: Conjunctivae normal.     Pupils: Pupils are equal, round, and reactive to light.  Neck:     Musculoskeletal: Normal range of motion and neck supple.  Cardiovascular:     Rate and Rhythm: Normal rate and regular rhythm.     Pulses: Normal pulses.     Heart sounds: Normal heart sounds.  Pulmonary:     Effort: Pulmonary effort is normal.     Breath sounds: Normal breath sounds.  Musculoskeletal: Normal range of motion.  Skin:    General: Skin is warm and dry.     Capillary Refill: Capillary refill takes less than 2 seconds.  Neurological:     General: No focal deficit present.     Mental Status: She is alert and oriented to person, place, and time.  Psychiatric:        Mood and Affect: Mood normal.        Behavior: Behavior normal.    Results for orders placed or performed in visit on 08/16/18 (from the past 24 hour(s))  POCT glucose (manual entry)     Status: Abnormal  Collection Time: 08/16/18  8:58 AM  Result Value Ref Range   POC Glucose 152 (A) 70 - 99 mg/dl  POCT glycosylated hemoglobin (Hb A1C)     Status: Abnormal   Collection Time: 08/16/18  9:16 AM  Result Value Ref Range   Hemoglobin A1C 7.2 (A) 4.0 - 5.6 %   HbA1c POC (<> result, manual entry)     HbA1c, POC (prediabetic range)     HbA1c, POC (controlled diabetic range)       A total of 40 minutes was spent in the room with the patient, greater than 50% of which was in counseling/coordination of care regarding chronic medical problems, treatment of medications, diet and nutrition, blood work reviewed, and need for follow-up in 3 months.  ASSESSMENT & PLAN: Diabetes mellitus type 2, uncomplicated (HCC) Hemoglobin A1c today better than before at 7.2.  Continue present medication.  Follow-up in 3 months.  Hypertensive heart disease without CHF Well-controlled blood pressure.  Continue present medication and follow-up in 3 months.  Angela Chambers was seen today for diabetes and medication refill.  Diagnoses and all orders for this visit:  Type 2 diabetes mellitus with hyperglycemia, without long-term current use of insulin (HCC) -     Cancel: POCT glucose (manual entry) -     Cancel: POCT glycosylated hemoglobin (Hb A1C) -     CBC with Differential/Platelet -     Comprehensive metabolic panel -     Lipid panel -     TSH -     POCT glucose (manual entry) -     POCT glycosylated hemoglobin (Hb A1C) -     metFORMIN (GLUCOPHAGE) 500 MG tablet; Take 1 tablet (500 mg total) by mouth 2 (two) times daily with a meal.  Hyperlipidemia, unspecified hyperlipidemia type  Hypertensive heart disease without CHF -     hydrochlorothiazide (HYDRODIURIL) 25 MG tablet; Take 1 tablet (25 mg total) by mouth daily. Please call and schedule an office visit for further refills 1st attempt  Hypertension associated with diabetes ([place])  Dyslipidemia associated with type 2 diabetes mellitus [facility])    Patient Instructions       If you have lab work done today you will be contacted with your lab results within the next 2 weeks.  If you have not heard from [place] then please contact us. The fastest way to get your results is to register for My Chart.   IF you received an x-ray today, you will receive an invoice from [facility]. Please contact [facility] at  [phone] with questions or concerns regarding your invoice.   IF you received labwork today, you will receive an invoice from [place]. Please contact [facility] at [phone] with questions or concerns regarding your invoice.   Our billing staff will not be able to assist you with questions regarding bills from these companies.  You will be contacted with the lab results as soon as they are available. The fastest way to get your results is to activate your My Chart account. Instructions are located on the last page of this paperwork. If you have not heard from [place] regarding the results in 2 weeks, please contact this office.     Diabetes Mellitus and Nutrition, Adult When you have diabetes (diabetes mellitus), it is very important to have healthy eating habits because your blood sugar (glucose) levels are greatly affected by what you eat and drink. Eating healthy foods in the appropriate amounts, at about the same times every day, can help  you:  Control your blood glucose.  Lower your risk of heart disease.  Improve your blood pressure.  Reach or maintain a healthy weight. Every person with diabetes is different, and each person has different needs for a meal plan. Your health care provider may recommend that you work with a diet and nutrition specialist (dietitian) to make a meal plan that is best for you. Your meal plan may vary depending on factors such as:  The calories you need.  The medicines you take.  Your weight.  Your blood glucose, blood pressure, and cholesterol levels.  Your activity level.  Other health conditions you have, such as heart or kidney disease. How do carbohydrates affect me? Carbohydrates, also called carbs, affect your blood glucose level more than any other type of food. Eating carbs naturally raises the amount of glucose in your blood. Carb counting is a method for keeping track of how many carbs you eat. Counting carbs is important to keep your  blood glucose at a healthy level, especially if you use insulin or take certain oral diabetes medicines. It is important to know how many carbs you can safely have in each meal. This is different for every person. Your dietitian can help you calculate how many carbs you should have at each meal and for each snack. Foods that contain carbs include:  Bread, cereal, rice, pasta, and crackers.  Potatoes and corn.  Peas, beans, and lentils.  Milk and yogurt.  Fruit and juice.  Desserts, such as cakes, cookies, ice cream, and candy. How does alcohol affect me? Alcohol can cause a sudden decrease in blood glucose (hypoglycemia), especially if you use insulin or take certain oral diabetes medicines. Hypoglycemia can be a life-threatening condition. Symptoms of hypoglycemia (sleepiness, dizziness, and confusion) are similar to symptoms of having too much alcohol. If your health care provider says that alcohol is safe for you, follow these guidelines:  Limit alcohol intake to no more than 1 drink per day for nonpregnant women and 2 drinks per day for men. One drink equals 12 oz of beer, 5 oz of wine, or 1 oz of hard liquor.  Do not drink on an empty stomach.  Keep yourself hydrated with water, diet soda, or unsweetened iced tea.  Keep in mind that regular soda, juice, and other mixers may contain a lot of sugar and must be counted as carbs. What are tips for following this plan?  Reading food labels  Start by checking the serving size on the "Nutrition Facts" label of packaged foods and drinks. The amount of calories, carbs, fats, and other nutrients listed on the label is based on one serving of the item. Many items contain more than one serving per package.  Check the total grams (g) of carbs in one serving. You can calculate the number of servings of carbs in one serving by dividing the total carbs by 15. For example, if a food has 30 g of total carbs, it would be equal to 2 servings of  carbs.  Check the number of grams (g) of saturated and trans fats in one serving. Choose foods that have low or no amount of these fats.  Check the number of milligrams (mg) of salt (sodium) in one serving. Most people should limit total sodium intake to less than 2,300 mg per day.  Always check the nutrition information of foods labeled as "low-fat" or "nonfat". These foods may be higher in added sugar or refined carbs and should be avoided.  Talk to your dietitian to identify your daily goals for nutrients listed on the label. Shopping  Avoid buying canned, premade, or processed foods. These foods tend to be high in fat, sodium, and added sugar.  Shop around the outside edge of the grocery store. This includes fresh fruits and vegetables, bulk grains, fresh meats, and fresh dairy. Cooking  Use low-heat cooking methods, such as baking, instead of high-heat cooking methods like deep frying.  Cook using healthy oils, such as olive, canola, or sunflower oil.  Avoid cooking with butter, cream, or high-fat meats. Meal planning  Eat meals and snacks regularly, preferably at the same times every day. Avoid going long periods of time without eating.  Eat foods high in fiber, such as fresh fruits, vegetables, beans, and whole grains. Talk to your dietitian about how many servings of carbs you can eat at each meal.  Eat 4-6 ounces (oz) of lean protein each day, such as lean meat, chicken, fish, eggs, or tofu. One oz of lean protein is equal to: ? 1 oz of meat, chicken, or fish. ? 1 egg. ?  cup of tofu.  Eat some foods each day that contain healthy fats, such as avocado, nuts, seeds, and fish. Lifestyle  Check your blood glucose regularly.  Exercise regularly as told by your health care provider. This may include: ? 150 minutes of moderate-intensity or vigorous-intensity exercise each week. This could be brisk walking, biking, or water aerobics. ? Stretching and doing strength  exercises, such as yoga or weightlifting, at least 2 times a week.  Take medicines as told by your health care provider.  Do not use any products that contain nicotine or tobacco, such as cigarettes and e-cigarettes. If you need help quitting, ask your health care provider.  Work with a Social worker or diabetes educator to identify strategies to manage stress and any emotional and social challenges. Questions to ask a health care provider  Do I need to meet with a diabetes educator?  Do I need to meet with a dietitian?  What number can I call if I have questions?  When are the best times to check my blood glucose? Where to find more information:  American Diabetes Association: diabetes.org  Academy of Nutrition and Dietetics: www.eatright.[employer] of Diabetes and Digestive and Kidney Diseases (NIH): [employer] Summary  A healthy meal plan will help you control your blood glucose and maintain a healthy lifestyle.  Working with a diet and nutrition specialist (dietitian) can help you make a meal plan that is best for you.  Keep in mind that carbohydrates (carbs) and alcohol have immediate effects on your blood glucose levels. It is important to count carbs and to use alcohol carefully. This information is not intended to replace advice given to you by your health care provider. Make sure you discuss any questions you have with your health care provider. Document Released: 02/12/2005 Document Revised: 12/16/2016 Document Reviewed: 06/22/2016 Elsevier Interactive Patient Education  2019 Elsevier Inc.      Agustina Caroli, MD [facility]

## 2018-08-16 NOTE — Assessment & Plan Note (Signed)
Well-controlled blood pressure.  Continue present medication and follow-up in 3 months.

## 2018-08-17 LAB — COMPREHENSIVE METABOLIC PANEL WITH GFR
ALT: 22 [IU]/L (ref 0–32)
AST: 18 [IU]/L (ref 0–40)
Albumin/Globulin Ratio: 1.9 (ref 1.2–2.2)
Albumin: 4.3 g/dL (ref 3.7–4.7)
Alkaline Phosphatase: 99 [IU]/L (ref 39–117)
BUN/Creatinine Ratio: 21 (ref 12–28)
BUN: 20 mg/dL (ref 8–27)
Bilirubin Total: 0.4 mg/dL (ref 0.0–1.2)
CO2: 23 mmol/L (ref 20–29)
Calcium: 9.7 mg/dL (ref 8.7–10.3)
Chloride: 104 mmol/L (ref 96–106)
Creatinine, Ser: 0.96 mg/dL (ref 0.57–1.00)
GFR calc Af Amer: 66 mL/min/{1.73_m2}
GFR calc non Af Amer: 57 mL/min/{1.73_m2} — ABNORMAL LOW
Globulin, Total: 2.3 g/dL (ref 1.5–4.5)
Glucose: 163 mg/dL — ABNORMAL HIGH (ref 65–99)
Potassium: 3.9 mmol/L (ref 3.5–5.2)
Sodium: 144 mmol/L (ref 134–144)
Total Protein: 6.6 g/dL (ref 6.0–8.5)

## 2018-08-17 LAB — CBC WITH DIFFERENTIAL/PLATELET
Basophils Absolute: 0.1 10*3/uL (ref 0.0–0.2)
Basos: 1 %
EOS (ABSOLUTE): 0.2 10*3/uL (ref 0.0–0.4)
Eos: 3 %
Hematocrit: 34.8 % (ref 34.0–46.6)
Hemoglobin: 11.9 g/dL (ref 11.1–15.9)
Immature Grans (Abs): 0 10*3/uL (ref 0.0–0.1)
Immature Granulocytes: 0 %
Lymphocytes Absolute: 2.1 10*3/uL (ref 0.7–3.1)
Lymphs: 40 %
MCH: 29.1 pg (ref 26.6–33.0)
MCHC: 34.2 g/dL (ref 31.5–35.7)
MCV: 85 fL (ref 79–97)
Monocytes Absolute: 0.5 10*3/uL (ref 0.1–0.9)
Monocytes: 9 %
Neutrophils Absolute: 2.4 10*3/uL (ref 1.4–7.0)
Neutrophils: 47 %
Platelets: 314 10*3/uL (ref 150–450)
RBC: 4.09 x10E6/uL (ref 3.77–5.28)
RDW: 14.7 % (ref 11.7–15.4)
WBC: 5.3 10*3/uL (ref 3.4–10.8)

## 2018-08-17 LAB — LIPID PANEL
Chol/HDL Ratio: 2.5 ratio (ref 0.0–4.4)
Cholesterol, Total: 168 mg/dL (ref 100–199)
HDL: 68 mg/dL (ref >39)
LDL Calculated: 84 mg/dL (ref 0–99)
TRIGLYCERIDES: 82 mg/dL (ref 0–149)
VLDL Cholesterol Cal: 16 mg/dL (ref 5–40)

## 2018-08-17 LAB — TSH: TSH: 4.94 u[IU]/mL — ABNORMAL HIGH (ref 0.450–4.500)

## 2018-09-02 ENCOUNTER — Other Ambulatory Visit: Payer: Self-pay | Admitting: Emergency Medicine

## 2018-09-02 DIAGNOSIS — I10 Essential (primary) hypertension: Secondary | ICD-10-CM

## 2018-09-03 ENCOUNTER — Other Ambulatory Visit: Payer: Self-pay | Admitting: Emergency Medicine

## 2018-09-03 DIAGNOSIS — E119 Type 2 diabetes mellitus without complications: Secondary | ICD-10-CM

## 2018-09-03 DIAGNOSIS — E7849 Other hyperlipidemia: Secondary | ICD-10-CM

## 2018-09-03 MED ORDER — ATORVASTATIN CALCIUM 20 MG PO TABS: 20.0000 mg | ORAL_TABLET | Freq: Every day | ORAL | 3 refills | Status: DC

## 2018-09-03 MED ORDER — AMLODIPINE BESYLATE 5 MG PO TABS: 5.0000 mg | ORAL_TABLET | Freq: Every day | ORAL | 3 refills | Status: DC

## 2018-11-21 ENCOUNTER — Telehealth (INDEPENDENT_AMBULATORY_CARE_PROVIDER_SITE_OTHER): Payer: Medicare Other | Admitting: Emergency Medicine

## 2018-11-21 ENCOUNTER — Encounter: Payer: Self-pay | Admitting: Emergency Medicine

## 2018-11-21 DIAGNOSIS — E785 Hyperlipidemia, unspecified: Secondary | ICD-10-CM

## 2018-11-21 DIAGNOSIS — I10 Essential (primary) hypertension: Secondary | ICD-10-CM

## 2018-11-21 DIAGNOSIS — E1165 Type 2 diabetes mellitus with hyperglycemia: Secondary | ICD-10-CM

## 2018-11-21 NOTE — Progress Notes (Signed)
This is a 78 y.o. female with history of diabetes and hypertension and dyslipidemia.  Has no complaints or medical concerns today. Did have another eye droop added to med regimen latanoprost. Maintaining through this quarantine order. No anxiety or depression. No medication refills needed at this time. Labs have been pended for future draw

## 2018-11-21 NOTE — Progress Notes (Signed)
Telemedicine Encounter- SOAP NOTE Established Patient  This telephone encounter was conducted with the patient's (or proxy's) verbal consent via audio telecommunications: yes/no: Yes Patient was instructed to have this encounter in a suitably private space; and to only have persons present to whom they give permission to participate. In addition, patient identity was confirmed by use of name plus two identifiers (DOB and address).  I discussed the limitations, risks, security and privacy concerns of performing an evaluation and management service by telephone and the availability of in person appointments. I also discussed with the patient that there may be a patient responsible charge related to this service. The patient expressed understanding and agreed to proceed.  I spent a total of TIME; 0 MIN TO 60 MIN: 10 minutes talking with the patient or their proxy.  No chief complaint on file. Follow-up of diabetes and hypertension  Subjective   Angela Chambers is a 78 y.o. female established patient. Telephone visit today for follow-up of diabetes and hypertension.  Has no complaints or medical concerns.  Compliant with medications.  Denies side effects from medications.  Does not need medication refills.  Blood glucose at home 120's. Lab Results  Component Value Date   HGBA1C 7.2 (A) 08/16/2018   BP Readings from Last 3 Encounters:  08/16/18 128/60  05/17/18 122/74  02/07/18 116/62   Lab Results  Component Value Date   CHOL 168 08/16/2018   HDL 68 08/16/2018   LDLCALC 84 08/16/2018   TRIG 82 08/16/2018   CHOLHDL 2.5 08/16/2018    HPI   Patient Active Problem List   Diagnosis Date Noted  . Hypertension associated with diabetes ([place]) 08/16/2018  . Abnormal stress echo EKG portion with normal echo -> imaging confirmed the correct with normal coronaries 06/21/2015  . Exertional chest pain 06/07/2015  . OA (osteoarthritis) of knee 09/25/2013  . Obesity (BMI 30.0-34.9)   .  Palpitations   . Hypertensive heart disease without CHF 11/30/2011  . Hyperlipidemia 08/25/2011  . Other and combined forms of senile cataract 07/04/2011  . Primary open angle glaucoma of both eyes, severe stage 07/04/2011  . Dyslipidemia associated with type 2 diabetes mellitus ([place]) 02/22/2009  . PERSONAL HISTORY OF COLONIC POLYPS 02/22/2009    Past Medical History:  Diagnosis Date  . Arthritis   . Cataract   . Diabetes mellitus   . Fibroids   . Glaucoma   . Hyperlipidemia   . Hypertension 07 /2013    WITH EF GREATER THAN 55 % WITH MILD MR BUT NO PROLASPE ,ESSENTIALLY NORMAL DONE TO EVALUATE PALPITATIONS .  Marland Kitchen Obesity (BMI 30.0-34.9)   . Osteoporosis   . Palpitations    CONTROLLED ON BETA BLOCKER    Current Outpatient Medications  Medication Sig Dispense Refill  . amLODipine (NORVASC) 5 MG tablet Take 1 tablet (5 mg total) by mouth daily. 90 tablet 3  . aspirin 81 MG tablet Take 81 mg by mouth daily.    Marland Kitchen atorvastatin (LIPITOR) 20 MG tablet Take 1 tablet (20 mg total) by mouth daily. 90 tablet 3  . Blood Glucose Monitoring Suppl (BLOOD GLUCOSE MONITOR SYSTEM) w/Device KIT 1 Units by Does not apply route 2 (two) times daily. 1 each 0  . Co-Enzyme Q-10 30 MG CAPS Take 30 mg by mouth daily.    . dorzolamide-timolol (COSOPT) 22.3-6.8 MG/ML ophthalmic solution INSTILL 1 DROP INTO BOTH EYES TWICE A DAY  10  . glipiZIDE (GLUCOTROL) 5 MG tablet Take 1 tablet (5 mg  total) by mouth daily before breakfast. 60 tablet 3  . hydrochlorothiazide (HYDRODIURIL) 25 MG tablet Take 1 tablet (25 mg total) by mouth daily. Please call and schedule an office visit for further refills 1st attempt 90 tablet 3  . latanoprost (XALATAN) 0.005 % ophthalmic solution     . losartan (COZAAR) 100 MG tablet Take 1 tablet (100 mg total) by mouth daily. 90 tablet 3  . metFORMIN (GLUCOPHAGE) 500 MG tablet Take 1 tablet (500 mg total) by mouth 2 (two) times daily with a meal. 180 tablet 3  . metoprolol succinate  (TOPROL-XL) 50 MG 24 hr tablet TAKE 1 TABLET(50 MG) BY MOUTH DAILY 90 tablet 3  . Multiple Vitamin (MULTIVITAMIN) tablet Take 1 tablet by mouth daily.    Marland Kitchen PRESCRIPTION MEDICATION Timolol 0.05 % taking 1 drop to each eye every am    . Travoprost, BAK Free, (TRAVATAN) 0.004 % SOLN ophthalmic solution Place 1 drop into both eyes at bedtime.     No current facility-administered medications for this visit.     Allergies  Allergen Reactions  . Lisinopril Cough  . Metformin And Related     Diarrhea on 1070m BID, tolerates 500 mg BID    Social History   Socioeconomic History  . Marital status: Married    Spouse name: Not on file  . Number of children: 3  . Years of education: Not on file  . Highest education level: Some college, no degree  Occupational History  . Occupation: retired  [employer] laboratory technician . Financial resource strain: Not hard at all  . Food insecurity    Worry: Never true    Inability: Never true  . Transportation needs    Medical: No    Non-medical: No  Tobacco Use  . Smoking status: Former Smoker    Packs/day: 0.50    Years: 20.00    Pack years: 10.00    Types: Cigarettes    Quit date: 07/13/1981    Years since quitting: 37.3  . Smokeless tobacco: Never Used  Substance and Sexual Activity  . Alcohol use: No  . Drug use: No  . Sexual activity: Yes  Lifestyle  . Physical activity    Days per week: 3 days    Minutes per session: 30 min  . Stress: Not at all  Relationships  . Social connections    Talks on phone: More than three times a week    Gets together: More than three times a week    Attends religious service: More than 4 times per year    Active member of club or organization: Yes    Attends meetings of clubs or organizations: More than 4 times per year    Relationship status: Married  . Intimate partner violence    Fear of current or ex partner: No    Emotionally abused: No    Physically abused: No    Forced sexual activity: No  Other Topics  Concern  . Not on file  Social History Narrative   SHE IS MARRIED ,MOTHER OF 4, GRANDMOTHER OF 6. SHE DOES EXERCISE BUT SOMEWHAT LIMITED BECAUSE OF HER KNEE (S/P SURGERY 2015).   SHE QUIT SMOKING IN 1980 AND SHE DOES NOT DRINK    Review of Systems  Constitutional: Negative.  Negative for chills and fever.  HENT: Negative.  Negative for congestion and sore throat.   Eyes: Negative.  Negative for blurred vision and double vision.  Respiratory: Negative.  Negative for shortness of breath.  Cardiovascular: Negative.  Negative for chest pain and palpitations.  Gastrointestinal: Negative.  Negative for abdominal pain, nausea and vomiting.  Genitourinary: Negative.  Negative for dysuria.  Musculoskeletal: Negative.  Negative for myalgias.  Skin: Negative.  Negative for rash.  Neurological: Negative.  Negative for dizziness and headaches.  All other systems reviewed and are negative.   Objective   Vitals as reported by the patient: Self-reported blood pressure 118/79 There were no vitals filed for this visit. Awake and oriented x3 in no apparent respiratory distress. Diagnoses and all orders for this visit:  Type 2 diabetes mellitus with hyperglycemia, without long-term current use of insulin (HCC)  Hyperlipidemia, unspecified hyperlipidemia type  Essential hypertension  Clinically stable.  No medical concerns identified during this visit.  Continue present medications.  Scheduled for future blood work. Office visit in 3 months.   I discussed the assessment and treatment plan with the patient. The patient was provided an opportunity to ask questions and all were answered. The patient agreed with the plan and demonstrated an understanding of the instructions.   The patient was advised to call back or seek an in-person evaluation if the symptoms worsen or if the condition fails to improve as anticipated.  I provided 10 minutes of non-face-to-face time during this encounter.  Horald Pollen, MD  Primary Care at [facility]

## 2019-01-08 ENCOUNTER — Other Ambulatory Visit: Payer: Self-pay | Admitting: Emergency Medicine

## 2019-01-08 NOTE — Telephone Encounter (Signed)
Requested Prescriptions  Pending Prescriptions Disp Refills  . glipiZIDE (GLUCOTROL) 5 MG tablet [Pharmacy Med Name: GLIPIZIDE 5MG  TABLETS] 90 tablet 0    Sig: TAKE 1 TABLET(5 MG) BY MOUTH DAILY BEFORE BREAKFAST     Endocrinology:  Diabetes - Sulfonylureas Passed - 01/08/2019  4:04 AM      Passed - HBA1C is between 0 and 7.9 and within 180 days    Hemoglobin A1C  Date Value Ref Range Status  08/16/2018 7.2 (A) 4.0 - 5.6 % Final   Hgb A1c MFr Bld  Date Value Ref Range Status  11/10/2017 7.3 (H) 4.8 - 5.6 % Final    Comment:             Prediabetes: 5.7 - 6.4          Diabetes: >6.4          Glycemic control for adults with diabetes: <7.0          Passed - Valid encounter within last 6 months    Recent Outpatient Visits          4 months ago Type 2 diabetes mellitus with hyperglycemia, without long-term current use of insulin ([place])   Primary Care at [facility], [place], [place]   7 months ago Type 2 diabetes mellitus without complication, without long-term current use of insulin [facility])   Primary Care at [facility], [place], [place]   11 months ago Type 2 diabetes mellitus without complication, without long-term current use of insulin [facility])   Primary Care at [place], [place] D, PA-C   1 year ago Type 2 diabetes mellitus without complication, without long-term current use of insulin ([place])   Primary Care at [facility], [place], [place]   1 year ago Type 2 diabetes mellitus without complication, without long-term current use of insulin [facility])   Primary Care at [facility], [place], [place]      Future Appointments            In 1 month Sagardia, Ines Bloomer, MD Primary Care at [place], [facility]

## 2019-02-20 ENCOUNTER — Ambulatory Visit: Payer: Medicare Other | Admitting: Emergency Medicine

## 2019-02-22 ENCOUNTER — Other Ambulatory Visit: Payer: Self-pay

## 2019-02-22 ENCOUNTER — Encounter: Payer: Self-pay | Admitting: Emergency Medicine

## 2019-02-22 ENCOUNTER — Ambulatory Visit (INDEPENDENT_AMBULATORY_CARE_PROVIDER_SITE_OTHER): Payer: Medicare Other | Admitting: Emergency Medicine

## 2019-02-22 DIAGNOSIS — E1159 Type 2 diabetes mellitus with other circulatory complications: Secondary | ICD-10-CM | POA: Diagnosis not present

## 2019-02-22 DIAGNOSIS — E1165 Type 2 diabetes mellitus with hyperglycemia: Secondary | ICD-10-CM | POA: Diagnosis not present

## 2019-02-22 DIAGNOSIS — I10 Essential (primary) hypertension: Secondary | ICD-10-CM

## 2019-02-22 LAB — POCT GLYCOSYLATED HEMOGLOBIN (HGB A1C): Hemoglobin A1C: 8.2 % — AB (ref 4.0–5.6)

## 2019-02-22 LAB — GLUCOSE, POCT (MANUAL RESULT ENTRY): POC Glucose: 221 mg/dl — AB (ref 70–99)

## 2019-02-22 NOTE — Progress Notes (Signed)
Lab Results  Component Value Date   HGBA1C 7.2 (A) 08/16/2018   BP Readings from Last 3 Encounters:  02/22/19 124/76  08/16/18 128/60  05/17/18 122/74   Angela Chambers 78 y.o.   Chief Complaint  Patient presents with  . Diabetes    FOLLOW UP x 6 month    HISTORY OF PRESENT ILLNESS: This is a 78 y.o. female with history of diabetes, hypertension, and dyslipidemia here for follow-up. 1.  Diabetes: On metformin 500 mg twice a day and glipizide 5 mg once a day.  Hemoglobin A1c done by visiting nurse last week was 10.1.  Patient states she has not been able to exercise and eat the way she normally does due to pandemic and increased stress. 2.  Hypertension: On Norvasc 5 mg, hydrochlorothiazide 25 mg, losartan 50 mg, and metoprolol 50 mg daily. 3.  Dyslipidemia: On atorvastatin 20 mg daily. Has no complaints or medical concerns other than diabetes today.  HPI   Prior to Admission medications   Medication Sig Start Date End Date Taking? Authorizing Provider  amLODipine (NORVASC) 5 MG tablet Take 1 tablet (5 mg total) by mouth daily. 09/03/18  Yes SagardiaInes Bloomer, MD  aspirin 81 MG tablet Take 81 mg by mouth daily.   Yes [provider]  atorvastatin (LIPITOR) 20 MG tablet Take 1 tablet (20 mg total) by mouth daily. 09/03/18  Yes Aylssa Herrig, Ines Bloomer, MD  brimonidine (ALPHAGAN P) 0.1 % SOLN Place 1 drop into both eyes 2 (two) times daily.   Yes [provider]  Co-Enzyme Q-10 30 MG CAPS Take 30 mg by mouth daily.   Yes [provider]  dorzolamide-timolol (COSOPT) 22.3-6.8 MG/ML ophthalmic solution INSTILL 1 DROP INTO BOTH EYES TWICE A DAY 09/23/17  Yes [provider]  glipiZIDE (GLUCOTROL) 5 MG tablet TAKE 1 TABLET(5 MG) BY MOUTH DAILY BEFORE BREAKFAST 01/08/19  Yes Zafir Schauer, Ines Bloomer, MD  losartan (COZAAR) 100 MG tablet Take 1 tablet (100 mg total) by mouth daily. 07/04/18  Yes Lizzie Cokley, Ines Bloomer, MD  metoprolol succinate (TOPROL-XL) 50  MG 24 hr tablet TAKE 1 TABLET(50 MG) BY MOUTH DAILY 03/11/18  Yes Leonie Man, MD  Multiple Vitamin (MULTIVITAMIN) tablet Take 1 tablet by mouth daily.   Yes [provider]  Travoprost, BAK Free, (TRAVATAN) 0.004 % SOLN ophthalmic solution Place 1 drop into both eyes at bedtime.   Yes [provider]  Blood Glucose Monitoring Suppl (BLOOD GLUCOSE MONITOR SYSTEM) w/Device KIT 1 Units by Does not apply route 2 (two) times daily. 02/07/18   Tenna Delaine D, PA-C  hydrochlorothiazide (HYDRODIURIL) 25 MG tablet Take 1 tablet (25 mg total) by mouth daily. Please call and schedule an office visit for further refills 1st attempt 08/16/18 11/14/18  Horald Pollen, MD  latanoprost Ivin Poot) 0.005 % ophthalmic solution  09/21/18   [provider]  metFORMIN (GLUCOPHAGE) 500 MG tablet Take 1 tablet (500 mg total) by mouth 2 (two) times daily with a meal. 08/16/18 11/14/18  Horald Pollen, MD  PRESCRIPTION MEDICATION Timolol 0.05 % taking 1 drop to each eye every am    [provider]    Allergies  Allergen Reactions  . Lisinopril Cough  . Metformin And Related     Diarrhea on 1059m BID, tolerates 500 mg BID    Patient Active Problem List   Diagnosis Date Noted  . Hypertension associated with diabetes ([place] 08/16/2018  . Abnormal stress echo EKG portion with normal echo ->  imaging confirmed the correct with normal coronaries 06/21/2015  . Exertional chest pain 06/07/2015  . OA (osteoarthritis) of knee 09/25/2013  . Obesity (BMI 30.0-34.9)   . Palpitations   . Hypertensive heart disease without CHF 11/30/2011  . Hyperlipidemia 08/25/2011  . Other and combined forms of senile cataract 07/04/2011  . Primary open angle glaucoma of both eyes, severe stage 07/04/2011  . Dyslipidemia associated with type 2 diabetes mellitus ([place]) 02/22/2009  . PERSONAL HISTORY OF COLONIC POLYPS 02/22/2009    Past Medical History:  Diagnosis Date  . Arthritis   .  Cataract   . Diabetes mellitus   . Fibroids   . Glaucoma   . Hyperlipidemia   . Hypertension 07 /2013    WITH EF GREATER THAN 55 % WITH MILD MR BUT NO PROLASPE ,ESSENTIALLY NORMAL DONE TO EVALUATE PALPITATIONS .  Marland Kitchen Obesity (BMI 30.0-34.9)   . Osteoporosis   . Palpitations    CONTROLLED ON BETA BLOCKER    Past Surgical History:  Procedure Laterality Date  . ABDOMINAL HYSTERECTOMY  10/18/ 1982   TAH-BSO for fibroids and adenomatous hyperplasia  . CARDIAC CATHETERIZATION  08/2009   NONOBSTRUCTIVE CAD WITH 20% IN THE LAD AND 20 % IN THE RCA NORMAL EF 65%  . CARDIAC CATHETERIZATION N/A 07/05/2015   Procedure: Left Heart Cath and Coronary Angiography;  Surgeon: Leonie Man, MD;  Location: [facility];  Service: Cardiovascular: Angiographically minimal CAD. EF 60-65%  . CPET-MET Test (Cardiopulmonary Exercise Test)  03/06/2013   Adequate effort at 1.11. Peak VO2 12.3 is 86% in the normal range. Heart rate was just outside the normal range target being 127 beats a minute but this was 86% of projected. She did have an ischemic pattern after the anterograde threshold with increased heart rate and blunting of stroke volume during the last 3 minutes of exercise, but was asymptomatic.  Marland Kitchen JOINT REPLACEMENT  09/05/2009   L knee  . KNEE SURGERY  1984   per pt screws in R knee  . NM MYOVIEW LTD  06/2015   FALSE + GXT - Images Accurate:  TM - 6 min, 7 METs (93% MPHR) --> chest discomfort, notable ST depressions => Hight Risk Duke TM Score, but NO scintigraphic evidence of ischemia or infarction. EF 74$  . 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 TRANSTHORACIC ECHOCARDIOGRAM  July 2013   EF greater than 55%, mild MR no prolapse.  Normal  . TUBAL LIGATION      Social History   Socioeconomic History  . Marital status: Married    Spouse name: Not on file   . Number of children: 3  . Years of education: Not on file  . Highest education level: Some college, no degree  Occupational History  . Occupation: retired  Scientific laboratory technician  . Financial resource strain: Not hard at all  . Food insecurity    Worry: Never true    Inability: Never true  . Transportation needs    Medical: No    Non-medical: No  Tobacco Use  . Smoking status: Former Smoker    Packs/day: 0.50    Years: 20.00    Pack years: 10.00    Types: Cigarettes    Quit date: 07/13/1981    Years since quitting: 37.6  . Smokeless tobacco: Never Used  Substance and Sexual Activity  . Alcohol  use: No  . Drug use: No  . Sexual activity: Yes  Lifestyle  . Physical activity    Days per week: 3 days    Minutes per session: 30 min  . Stress: Not at all  Relationships  . Social connections    Talks on phone: More than three times a week    Gets together: More than three times a week    Attends religious service: More than 4 times per year    Active member of club or organization: Yes    Attends meetings of clubs or organizations: More than 4 times per year    Relationship status: Married  . Intimate partner violence    Fear of current or ex partner: No    Emotionally abused: No    Physically abused: No    Forced sexual activity: No  Other Topics Concern  . Not on file  Social History Narrative   SHE IS MARRIED ,MOTHER OF 4, GRANDMOTHER OF 6. SHE DOES EXERCISE BUT SOMEWHAT LIMITED BECAUSE OF HER KNEE (S/P SURGERY 2015).   SHE QUIT SMOKING IN 1980 AND SHE DOES NOT DRINK    Family History  Problem Relation Age of Onset  . Diabetes Mother   . Heart disease Mother   . Hyperlipidemia Mother   . Diabetes Sister   . Hyperlipidemia Sister   . Hypertension Sister   . Diabetes Brother   . Hyperlipidemia Brother   . Prostate cancer Son        prostate  . Hyperlipidemia Sister      Review of Systems  Constitutional: Negative.  Negative for chills and fever.  HENT: Negative.   Negative for congestion and sore throat.   Respiratory: Negative.  Negative for cough and shortness of breath.   Cardiovascular: Negative.  Negative for chest pain and palpitations.  Gastrointestinal: Negative.  Negative for abdominal pain, diarrhea, nausea and vomiting.  Genitourinary: Negative.  Negative for dysuria.  Musculoskeletal: Negative.  Negative for myalgias and neck pain.  Skin: Negative.  Negative for rash.  Neurological: Negative.  Negative for dizziness and headaches.  All other systems reviewed and are negative.    Vitals:   02/22/19 1045  BP: 124/76  Pulse: 75  Resp: 16  Temp: 98.4 F (36.9 C)  SpO2: 98%     Physical Exam Vitals signs reviewed.  Constitutional:      Appearance: Normal appearance.  HENT:     Head: Normocephalic.  Eyes:     Extraocular Movements: Extraocular movements intact.     Pupils: Pupils are equal, round, and reactive to light.  Neck:     Musculoskeletal: Normal range of motion and neck supple.  Cardiovascular:     Rate and Rhythm: Normal rate and regular rhythm.     Pulses: Normal pulses.     Heart sounds: Normal heart sounds.  Pulmonary:     Effort: Pulmonary effort is normal.     Breath sounds: Normal breath sounds.  Musculoskeletal: Normal range of motion.  Skin:    General: Skin is warm and dry.     Capillary Refill: Capillary refill takes less than 2 seconds.  Neurological:     General: No focal deficit present.     Mental Status: She is alert and oriented to person, place, and time.  Psychiatric:        Mood and Affect: Mood normal.        Behavior: Behavior normal.    Results for orders placed or performed in  visit on 02/22/19 (from the past 24 hour(s))  POCT glucose (manual entry)     Status: Abnormal   Collection Time: 02/22/19 11:23 AM  Result Value Ref Range   POC Glucose 221 (A) 70 - 99 mg/dl  POCT glycosylated hemoglobin (Hb A1C)     Status: Abnormal   Collection Time: 02/22/19 11:29 AM  Result Value Ref  Range   Hemoglobin A1C 8.2 (A) 4.0 - 5.6 %   HbA1c POC (<> result, manual entry)     HbA1c, POC (prediabetic range)     HbA1c, POC (controlled diabetic range)       ASSESSMENT & PLAN: Hypertension associated with diabetes ([place]) Uncontrolled diabetes with hemoglobin A1c at 8.2 today.  Continue metformin and increase glipizide to 5 mg twice a day.  Follow-up in 3 months. Hypertension well-controlled.  Continue present medication.  No changes. Continue statin therapy.   Seleny was seen today for diabetes.  Diagnoses and all orders for this visit:  Type 2 diabetes mellitus with hyperglycemia, without long-term current use of insulin (HCC) -     POCT glucose (manual entry) -     POCT glycosylated hemoglobin (Hb A1C) -     CBC with Differential/Platelet -     Lipid panel -     Microalbumin / creatinine urine ratio -     Comprehensive metabolic panel  Hypertension associated with diabetes [facility])    Patient Instructions       If you have lab work done today you will be contacted with your lab results within the next 2 weeks.  If you have not heard from [place] then please contact us. The fastest way to get your results is to register for My Chart.   IF you received an x-ray today, you will receive an invoice from [facility]. Please contact [facility] at [phone] with questions or concerns regarding your invoice.   IF you received labwork today, you will receive an invoice from [facility]. Please contact [facility] at [phone] with questions or concerns regarding your invoice.   Our billing staff will not be able to assist you with questions regarding bills from these companies.  You will be contacted with the lab results as soon as they are available. The fastest way to get your results is to activate your My Chart account. Instructions are located on the last page of this paperwork. If you have not heard from [place] regarding the results in 2 weeks, please  contact this office.     Diabetes Mellitus and Nutrition, Adult When you have diabetes (diabetes mellitus), it is very important to have healthy eating habits because your blood sugar (glucose) levels are greatly affected by what you eat and drink. Eating healthy foods in the appropriate amounts, at about the same times every day, can help you:  Control your blood glucose.  Lower your risk of heart disease.  Improve your blood pressure.  Reach or maintain a healthy weight. Every person with diabetes is different, and each person has different needs for a meal plan. Your health care provider may recommend that you work with a diet and nutrition specialist (dietitian) to make a meal plan that is best for you. Your meal plan may vary depending on factors such as:  The calories you need.  The medicines you take.  Your weight.  Your blood glucose, blood pressure, and cholesterol levels.  Your activity level.  Other health conditions you have, such as heart or kidney disease. How do carbohydrates  affect me? Carbohydrates, also called carbs, affect your blood glucose level more than any other type of food. Eating carbs naturally raises the amount of glucose in your blood. Carb counting is a method for keeping track of how many carbs you eat. Counting carbs is important to keep your blood glucose at a healthy level, especially if you use insulin or take certain oral diabetes medicines. It is important to know how many carbs you can safely have in each meal. This is different for every person. Your dietitian can help you calculate how many carbs you should have at each meal and for each snack. Foods that contain carbs include:  Bread, cereal, rice, pasta, and crackers.  Potatoes and corn.  Peas, beans, and lentils.  Milk and yogurt.  Fruit and juice.  Desserts, such as cakes, cookies, ice cream, and candy. How does alcohol affect me? Alcohol can cause a sudden decrease in blood  glucose (hypoglycemia), especially if you use insulin or take certain oral diabetes medicines. Hypoglycemia can be a life-threatening condition. Symptoms of hypoglycemia (sleepiness, dizziness, and confusion) are similar to symptoms of having too much alcohol. If your health care provider says that alcohol is safe for you, follow these guidelines:  Limit alcohol intake to no more than 1 drink per day for nonpregnant women and 2 drinks per day for men. One drink equals 12 oz of beer, 5 oz of wine, or 1 oz of hard liquor.  Do not drink on an empty stomach.  Keep yourself hydrated with water, diet soda, or unsweetened iced tea.  Keep in mind that regular soda, juice, and other mixers may contain a lot of sugar and must be counted as carbs. What are tips for following this plan?  Reading food labels  Start by checking the serving size on the "Nutrition Facts" label of packaged foods and drinks. The amount of calories, carbs, fats, and other nutrients listed on the label is based on one serving of the item. Many items contain more than one serving per package.  Check the total grams (g) of carbs in one serving. You can calculate the number of servings of carbs in one serving by dividing the total carbs by 15. For example, if a food has 30 g of total carbs, it would be equal to 2 servings of carbs.  Check the number of grams (g) of saturated and trans fats in one serving. Choose foods that have low or no amount of these fats.  Check the number of milligrams (mg) of salt (sodium) in one serving. Most people should limit total sodium intake to less than 2,300 mg per day.  Always check the nutrition information of foods labeled as "low-fat" or "nonfat". These foods may be higher in added sugar or refined carbs and should be avoided.  Talk to your dietitian to identify your daily goals for nutrients listed on the label. Shopping  Avoid buying canned, premade, or processed foods. These foods tend to  be high in fat, sodium, and added sugar.  Shop around the outside edge of the grocery store. This includes fresh fruits and vegetables, bulk grains, fresh meats, and fresh dairy. Cooking  Use low-heat cooking methods, such as baking, instead of high-heat cooking methods like deep frying.  Cook using healthy oils, such as olive, canola, or sunflower oil.  Avoid cooking with butter, cream, or high-fat meats. Meal planning  Eat meals and snacks regularly, preferably at the same times every day. Avoid going long periods of  time without eating.  Eat foods high in fiber, such as fresh fruits, vegetables, beans, and whole grains. Talk to your dietitian about how many servings of carbs you can eat at each meal.  Eat 4-6 ounces (oz) of lean protein each day, such as lean meat, chicken, fish, eggs, or tofu. One oz of lean protein is equal to: ? 1 oz of meat, chicken, or fish. ? 1 egg. ?  cup of tofu.  Eat some foods each day that contain healthy fats, such as avocado, nuts, seeds, and fish. Lifestyle  Check your blood glucose regularly.  Exercise regularly as told by your health care provider. This may include: ? 150 minutes of moderate-intensity or vigorous-intensity exercise each week. This could be brisk walking, biking, or water aerobics. ? Stretching and doing strength exercises, such as yoga or weightlifting, at least 2 times a week.  Take medicines as told by your health care provider.  Do not use any products that contain nicotine or tobacco, such as cigarettes and e-cigarettes. If you need help quitting, ask your health care provider.  Work with a Social worker or diabetes educator to identify strategies to manage stress and any emotional and social challenges. Questions to ask a health care provider  Do I need to meet with a diabetes educator?  Do I need to meet with a dietitian?  What number can I call if I have questions?  When are the best times to check my blood glucose?  Where to find more information:  American Diabetes Association: diabetes.org  Academy of Nutrition and Dietetics: www.eatright.[employer] of Diabetes and Digestive and Kidney Diseases (NIH): [employer] Summary  A healthy meal plan will help you control your blood glucose and maintain a healthy lifestyle.  Working with a diet and nutrition specialist (dietitian) can help you make a meal plan that is best for you.  Keep in mind that carbohydrates (carbs) and alcohol have immediate effects on your blood glucose levels. It is important to count carbs and to use alcohol carefully. This information is not intended to replace advice given to you by your health care provider. Make sure you discuss any questions you have with your health care provider. Document Released: 02/12/2005 Document Revised: 04/30/2017 Document Reviewed: 06/22/2016 Elsevier Patient Education  2020 [employer]vier Inc.      Agustina Caroli, MD [facility]

## 2019-02-22 NOTE — Patient Instructions (Addendum)
   If you have lab work done today you will be contacted with your lab results within the next 2 weeks.  If you have not heard from us then please contact us. The fastest way to get your results is to register for My Chart.   IF you received an x-ray today, you will receive an invoice from [facility]. Please contact [facility] at [phone] with questions or concerns regarding your invoice.   IF you received labwork today, you will receive an invoice from [facility]. Please contact [facility] at [phone] with questions or concerns regarding your invoice.   Our billing staff will not be able to assist you with questions regarding bills from these companies.  You will be contacted with the lab results as soon as they are available. The fastest way to get your results is to activate your My Chart account. Instructions are located on the last page of this paperwork. If you have not heard from us regarding the results in 2 weeks, please contact this office.     Diabetes Mellitus and Nutrition, Adult When you have diabetes (diabetes mellitus), it is very important to have healthy eating habits because your blood sugar (glucose) levels are greatly affected by what you eat and drink. Eating healthy foods in the appropriate amounts, at about the same times every day, can help you:  Control your blood glucose.  Lower your risk of heart disease.  Improve your blood pressure.  Reach or maintain a healthy weight. Every person with diabetes is different, and each person has different needs for a meal plan. Your health care provider may recommend that you work with a diet and nutrition specialist (dietitian) to make a meal plan that is best for you. Your meal plan may vary depending on factors such as:  The calories you need.  The medicines you take.  Your weight.  Your blood glucose, blood pressure, and cholesterol levels.  Your activity level.  Other health conditions  you have, such as heart or kidney disease. How do carbohydrates affect me? Carbohydrates, also called carbs, affect your blood glucose level more than any other type of food. Eating carbs naturally raises the amount of glucose in your blood. Carb counting is a method for keeping track of how many carbs you eat. Counting carbs is important to keep your blood glucose at a healthy level, especially if you use insulin or take certain oral diabetes medicines. It is important to know how many carbs you can safely have in each meal. This is different for every person. Your dietitian can help you calculate how many carbs you should have at each meal and for each snack. Foods that contain carbs include:  Bread, cereal, rice, pasta, and crackers.  Potatoes and corn.  Peas, beans, and lentils.  Milk and yogurt.  Fruit and juice.  Desserts, such as cakes, cookies, ice cream, and candy. How does alcohol affect me? Alcohol can cause a sudden decrease in blood glucose (hypoglycemia), especially if you use insulin or take certain oral diabetes medicines. Hypoglycemia can be a life-threatening condition. Symptoms of hypoglycemia (sleepiness, dizziness, and confusion) are similar to symptoms of having too much alcohol. If your health care provider says that alcohol is safe for you, follow these guidelines:  Limit alcohol intake to no more than 1 drink per day for nonpregnant women and 2 drinks per day for men. One drink equals 12 oz of beer, 5 oz of wine, or 1 oz of hard liquor.    Do not drink on an empty stomach.  Keep yourself hydrated with water, diet soda, or unsweetened iced tea.  Keep in mind that regular soda, juice, and other mixers may contain a lot of sugar and must be counted as carbs. What are tips for following this plan?  Reading food labels  Start by checking the serving size on the "Nutrition Facts" label of packaged foods and drinks. The amount of calories, carbs, fats, and other  nutrients listed on the label is based on one serving of the item. Many items contain more than one serving per package.  Check the total grams (g) of carbs in one serving. You can calculate the number of servings of carbs in one serving by dividing the total carbs by 15. For example, if a food has 30 g of total carbs, it would be equal to 2 servings of carbs.  Check the number of grams (g) of saturated and trans fats in one serving. Choose foods that have low or no amount of these fats.  Check the number of milligrams (mg) of salt (sodium) in one serving. Most people should limit total sodium intake to less than 2,300 mg per day.  Always check the nutrition information of foods labeled as "low-fat" or "nonfat". These foods may be higher in added sugar or refined carbs and should be avoided.  Talk to your dietitian to identify your daily goals for nutrients listed on the label. Shopping  Avoid buying canned, premade, or processed foods. These foods tend to be high in fat, sodium, and added sugar.  Shop around the outside edge of the grocery store. This includes fresh fruits and vegetables, bulk grains, fresh meats, and fresh dairy. Cooking  Use low-heat cooking methods, such as baking, instead of high-heat cooking methods like deep frying.  Cook using healthy oils, such as olive, canola, or sunflower oil.  Avoid cooking with butter, cream, or high-fat meats. Meal planning  Eat meals and snacks regularly, preferably at the same times every day. Avoid going long periods of time without eating.  Eat foods high in fiber, such as fresh fruits, vegetables, beans, and whole grains. Talk to your dietitian about how many servings of carbs you can eat at each meal.  Eat 4-6 ounces (oz) of lean protein each day, such as lean meat, chicken, fish, eggs, or tofu. One oz of lean protein is equal to: ? 1 oz of meat, chicken, or fish. ? 1 egg. ?  cup of tofu.  Eat some foods each day that contain  healthy fats, such as avocado, nuts, seeds, and fish. Lifestyle  Check your blood glucose regularly.  Exercise regularly as told by your health care provider. This may include: ? 150 minutes of moderate-intensity or vigorous-intensity exercise each week. This could be brisk walking, biking, or water aerobics. ? Stretching and doing strength exercises, such as yoga or weightlifting, at least 2 times a week.  Take medicines as told by your health care provider.  Do not use any products that contain nicotine or tobacco, such as cigarettes and e-cigarettes. If you need help quitting, ask your health care provider.  Work with a counselor or diabetes educator to identify strategies to manage stress and any emotional and social challenges. Questions to ask a health care provider  Do I need to meet with a diabetes educator?  Do I need to meet with a dietitian?  What number can I call if I have questions?  When are the best times to   check my blood glucose? Where to find more information:  American Diabetes Association: diabetes.org  Academy of Nutrition and Dietetics: www.eatright.org  National Institute of Diabetes and Digestive and Kidney Diseases (NIH): www.niddk.nih.gov Summary  A healthy meal plan will help you control your blood glucose and maintain a healthy lifestyle.  Working with a diet and nutrition specialist (dietitian) can help you make a meal plan that is best for you.  Keep in mind that carbohydrates (carbs) and alcohol have immediate effects on your blood glucose levels. It is important to count carbs and to use alcohol carefully. This information is not intended to replace advice given to you by your health care provider. Make sure you discuss any questions you have with your health care provider. Document Released: 02/12/2005 Document Revised: 04/30/2017 Document Reviewed: 06/22/2016 Elsevier Patient Education  2020 Elsevier Inc.  

## 2019-02-22 NOTE — Assessment & Plan Note (Addendum)
Uncontrolled diabetes with hemoglobin A1c at 8.2 today.  Continue metformin and increase glipizide to 5 mg twice a day.  Follow-up in 3 months. Hypertension well-controlled.  Continue present medication.  No changes. Continue statin therapy.

## 2019-02-23 ENCOUNTER — Encounter: Payer: Self-pay | Admitting: Emergency Medicine

## 2019-02-23 LAB — CBC WITH DIFFERENTIAL/PLATELET
Basophils Absolute: 0 10*3/uL (ref 0.0–0.2)
Basos: 1 %
EOS (ABSOLUTE): 0.2 10*3/uL (ref 0.0–0.4)
Eos: 3 %
Hematocrit: 31.3 % — ABNORMAL LOW (ref 34.0–46.6)
Hemoglobin: 11.1 g/dL (ref 11.1–15.9)
Immature Grans (Abs): 0 10*3/uL (ref 0.0–0.1)
Immature Granulocytes: 0 %
Lymphocytes Absolute: 1.7 10*3/uL (ref 0.7–3.1)
Lymphs: 35 %
MCH: 29.5 pg (ref 26.6–33.0)
MCHC: 35.5 g/dL (ref 31.5–35.7)
MCV: 83 fL (ref 79–97)
Monocytes Absolute: 0.4 10*3/uL (ref 0.1–0.9)
Monocytes: 9 %
Neutrophils Absolute: 2.5 10*3/uL (ref 1.4–7.0)
Neutrophils: 52 %
Platelets: 299 10*3/uL (ref 150–450)
RBC: 3.76 x10E6/uL — ABNORMAL LOW (ref 3.77–5.28)
RDW: 14.4 % (ref 11.7–15.4)
WBC: 4.8 10*3/uL (ref 3.4–10.8)

## 2019-02-23 LAB — COMPREHENSIVE METABOLIC PANEL
ALT: 16 IU/L (ref 0–32)
AST: 15 IU/L (ref 0–40)
Albumin/Globulin Ratio: 2 (ref 1.2–2.2)
Albumin: 4.1 g/dL (ref 3.7–4.7)
Alkaline Phosphatase: 106 IU/L (ref 39–117)
BUN/Creatinine Ratio: 19 (ref 12–28)
BUN: 18 mg/dL (ref 8–27)
Bilirubin Total: 0.5 mg/dL (ref 0.0–1.2)
CO2: 28 mmol/L (ref 20–29)
Calcium: 9.9 mg/dL (ref 8.7–10.3)
Chloride: 101 mmol/L (ref 96–106)
Creatinine, Ser: 0.94 mg/dL (ref 0.57–1.00)
GFR calc Af Amer: 67 mL/min/{1.73_m2} (ref >59)
GFR calc non Af Amer: 58 mL/min/{1.73_m2} — ABNORMAL LOW (ref >59)
Globulin, Total: 2.1 g/dL (ref 1.5–4.5)
Glucose: 214 mg/dL — ABNORMAL HIGH (ref 65–99)
Potassium: 3.5 mmol/L (ref 3.5–5.2)
Sodium: 142 mmol/L (ref 134–144)
Total Protein: 6.2 g/dL (ref 6.0–8.5)

## 2019-02-23 LAB — LIPID PANEL
Chol/HDL Ratio: 2.6 ratio (ref 0.0–4.4)
Cholesterol, Total: 170 mg/dL (ref 100–199)
HDL: 65 mg/dL (ref >39)
LDL Chol Calc (NIH): 81 mg/dL (ref 0–99)
Triglycerides: 138 mg/dL (ref 0–149)
VLDL Cholesterol Cal: 24 mg/dL (ref 5–40)

## 2019-02-23 LAB — MICROALBUMIN / CREATININE URINE RATIO
Creatinine, Urine: 133.3 mg/dL
Microalb/Creat Ratio: 8 mg/g creat (ref 0–29)
Microalbumin, Urine: 10.1 ug/mL

## 2019-03-28 ENCOUNTER — Other Ambulatory Visit: Payer: Self-pay | Admitting: Emergency Medicine

## 2019-03-28 NOTE — Telephone Encounter (Signed)
Requested medication (s) are due for refill today: no  Requested medication (s) are on the active medication list: yes  Last refill:  01/08/2019  Future visit scheduled: yes  Notes to clinic:  Diabetes sulfonylureas failed  Requested Prescriptions  Pending Prescriptions Disp Refills   glipiZIDE (GLUCOTROL) 5 MG tablet [Pharmacy Med Name: GLIPIZIDE 5MG  TABLETS] 90 tablet 0    Sig: TAKE 1 TABLET(5 MG) BY MOUTH DAILY BEFORE BREAKFAST     Endocrinology:  Diabetes - Sulfonylureas Failed - 03/28/2019  5:26 PM      Failed - HBA1C is between 0 and 7.9 and within 180 days    Hemoglobin A1C  Date Value Ref Range Status  02/22/2019 8.2 (A) 4.0 - 5.6 % Final   Hgb A1c MFr Bld  Date Value Ref Range Status  11/10/2017 7.3 (H) 4.8 - 5.6 % Final    Comment:             Prediabetes: 5.7 - 6.4          Diabetes: >6.4          Glycemic control for adults with diabetes: <7.0          Passed - Valid encounter within last 6 months    Recent Outpatient Visits          1 month ago Type 2 diabetes mellitus with hyperglycemia, without long-term current use of insulin ([place])   Primary Care at [facility], [place], MD   4 months ago Type 2 diabetes mellitus with hyperglycemia, without long-term current use of insulin [facility])   Primary Care at [place], [place], MD   7 months ago Type 2 diabetes mellitus with hyperglycemia, without long-term current use of insulin [facility])   Primary Care at [facility], [place], [place]   10 months ago Type 2 diabetes mellitus without complication, without long-term current use of insulin [facility])   Primary Care at [place], [place], MD   1 year ago Type 2 diabetes mellitus without complication, without long-term current use of insulin [facility])   Primary Care at [facility], Reather Laurence, PA-C      Future Appointments            In 1 month Sagardia, Ines Bloomer, MD Primary Care at [place], [facility]

## 2019-04-16 ENCOUNTER — Other Ambulatory Visit: Payer: Self-pay | Admitting: Cardiology

## 2019-04-18 ENCOUNTER — Other Ambulatory Visit: Payer: Self-pay | Admitting: Cardiology

## 2019-04-18 ENCOUNTER — Other Ambulatory Visit: Payer: Self-pay

## 2019-04-18 NOTE — Telephone Encounter (Signed)
Left message for patient to call office back to schedule an appointment for future refills.

## 2019-04-18 NOTE — Telephone Encounter (Signed)
Left message for patient to call office back to schedule an appointment

## 2019-05-06 ENCOUNTER — Other Ambulatory Visit: Payer: Self-pay | Admitting: Emergency Medicine

## 2019-05-06 NOTE — Telephone Encounter (Signed)
Last ordered 03/29/19  90 day supply. Patient should have enough to last thru January 2021.

## 2019-05-08 ENCOUNTER — Other Ambulatory Visit: Payer: Self-pay

## 2019-05-08 ENCOUNTER — Ambulatory Visit: Payer: Medicare Other | Admitting: Cardiology

## 2019-05-08 VITALS — BP 140/79 | HR 94 | Ht 65.0 in | Wt 196.0 lb

## 2019-05-08 DIAGNOSIS — E1159 Type 2 diabetes mellitus with other circulatory complications: Secondary | ICD-10-CM | POA: Diagnosis not present

## 2019-05-08 DIAGNOSIS — E669 Obesity, unspecified: Secondary | ICD-10-CM | POA: Diagnosis not present

## 2019-05-08 DIAGNOSIS — I119 Hypertensive heart disease without heart failure: Secondary | ICD-10-CM | POA: Diagnosis not present

## 2019-05-08 DIAGNOSIS — R9439 Abnormal result of other cardiovascular function study: Secondary | ICD-10-CM

## 2019-05-08 DIAGNOSIS — I10 Essential (primary) hypertension: Secondary | ICD-10-CM

## 2019-05-08 DIAGNOSIS — E1169 Type 2 diabetes mellitus with other specified complication: Secondary | ICD-10-CM | POA: Diagnosis not present

## 2019-05-08 DIAGNOSIS — E785 Hyperlipidemia, unspecified: Secondary | ICD-10-CM

## 2019-05-08 MED ORDER — METOPROLOL SUCCINATE ER 50 MG PO TB24: 50.0000 mg | ORAL_TABLET | Freq: Every day | ORAL | 3 refills | Status: DC

## 2019-05-08 NOTE — Patient Instructions (Signed)

## 2019-05-08 NOTE — Progress Notes (Signed)
Primary Care Provider: Horald Pollen, MD Cardiologist: No primary care provider on file. Electrophysiologist:   Clinic Note: Chief Complaint  Patient presents with  . Follow-up    2-year.  No complaints  . Hypertension  . Hyperlipidemia   HPI:    Angela Chambers is a 78 y.o. female with a PMH notable for HTN Heart Disease w/o CHF - DOE & palpitations who presents today for ~2 yr f/u.Marland Kitchen  Initial GXT evaluation was abnormal, but Cath revealed normal coronaries in both 2011 & (after Myoview) in 2017.   Angela Chambers was last seen on in Sept 2018 -he is doing well.  No major issues.  Recent Hospitalizations: none  Reviewed  CV studies:    The following studies were reviewed today: (if available, images/films reviewed: From Epic Chart or Care Everywhere) . none:   Interval History:   Angela Chambers returns for 2-year follow-up doing well.  No other major issues. Major issue is that she has not been to lose weight.  Pretty frustrated.  She also says her blood pressure is little high for her today.  She, like many others is frustrated by the fact that she is not able to go to the gym.  She enjoyed walking around the track of the gym and doing some of the machines.  She is try to be as much walking as she can at home but has lacked the motivation to do as much as she probably should.  She also has not been as cognizant of what she eats. Otherwise stable her standpoint no real chest pain or dyspnea with rest or exertion.  Really since having the heart catheterization demonstrate no significant CAD, she has been relatively reassured and feeling okay.   CV Review of Symptoms (Summary)  no chest pain or dyspnea on exertion positive for - Mild swelling, but nothing significant. negative for - irregular heartbeat, orthopnea, palpitations, paroxysmal nocturnal dyspnea, rapid heart rate, shortness of breath or Syncope/near syncope, TIA/amaurosis fugax, claudication   The patient does not have symptoms concerning for COVID-19 infection (fever, chills, cough, or new shortness of breath).  The patient is practicing social distancing. ++ Masking.  She does go out for groceries/shopping, but is very careful with masking and hands annotation.  Tries to go out when there are not crowds.  REVIEWED OF SYSTEMS   A comprehensive ROS was performed. Review of Systems  Constitutional: Negative for malaise/fatigue (Just frustrated because lack of gym time) and weight loss.  HENT: Negative for congestion and nosebleeds.   Respiratory: Negative for cough and shortness of breath.   Gastrointestinal: Negative for blood in stool, heartburn and melena.  Genitourinary: Negative for hematuria.  Musculoskeletal: Positive for joint pain (Mild arthritis pains). Negative for falls.  Neurological: Negative for dizziness and headaches.  Psychiatric/Behavioral: Negative for memory loss. The patient is not nervous/anxious and does not have insomnia.   All other systems reviewed and are negative.  I have reviewed and (if needed) personally updated the patient's problem list, medications, allergies, past medical and surgical history, social and family history.   PAST MEDICAL HISTORY   Past Medical History:  Diagnosis Date  . Arthritis   . Cataract   . Diabetes mellitus   . Fibroids   . Glaucoma   . Hyperlipidemia   . Hypertension 07 /2013    WITH EF GREATER THAN 55 % WITH MILD MR BUT NO PROLASPE ,ESSENTIALLY NORMAL DONE TO EVALUATE PALPITATIONS .  Marland Kitchen Obesity (BMI  30.0-34.9)   . Osteoporosis   . Palpitations    CONTROLLED ON BETA BLOCKER    PAST SURGICAL HISTORY   Past Surgical History:  Procedure Laterality Date  . ABDOMINAL HYSTERECTOMY  10/18/ 1982   TAH-BSO for fibroids and adenomatous hyperplasia  . CARDIAC CATHETERIZATION  08/2009   NONOBSTRUCTIVE CAD WITH 20% IN THE LAD AND 20 % IN THE RCA NORMAL EF 65%  . CARDIAC CATHETERIZATION N/A 07/05/2015   Procedure: Left  Heart Cath and Coronary Angiography;  Surgeon: Leonie Man, MD;  Location: [facility];  Service: Cardiovascular: Angiographically minimal CAD. EF 60-65%  . CPET-MET Test (Cardiopulmonary Exercise Test)  03/06/2013   Adequate effort at 1.11. Peak VO2 12.3 is 86% in the normal range. Heart rate was just outside the normal range target being 127 beats a minute but this was 86% of projected. She did have an ischemic pattern after the anterograde threshold with increased heart rate and blunting of stroke volume during the last 3 minutes of exercise, but was asymptomatic.  Marland Kitchen JOINT REPLACEMENT  09/05/2009   L knee  . KNEE SURGERY  1984   per pt screws in R knee  . NM MYOVIEW LTD  06/2015   FALSE + GXT - Images Accurate:  TM - 6 min, 7 METs (93% MPHR) --> chest discomfort, notable ST depressions => Hight Risk Duke TM Score, but NO scintigraphic evidence of ischemia or infarction. EF 74$  . 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 TRANSTHORACIC ECHOCARDIOGRAM  July 2013   EF greater than 55%, mild MR no prolapse.  Normal  . TUBAL LIGATION     Diagnostic Diagram - angiographic normal, extremely tortuous vessels. Consistent with hypertensive heart disease     MEDICATIONS/ALLERGIES   Current Meds  Medication Sig  . amLODipine (NORVASC) 5 MG tablet Take 1 tablet (5 mg total) by mouth daily.  Marland Kitchen aspirin 81 MG tablet Take 81 mg by mouth daily.  Marland Kitchen atorvastatin (LIPITOR) 20 MG tablet Take 1 tablet (20 mg total) by mouth daily.  . Blood Glucose Monitoring Suppl (BLOOD GLUCOSE MONITOR SYSTEM) w/Device KIT 1 Units by Does not apply route 2 (two) times daily.  . brimonidine (ALPHAGAN P) 0.1 % SOLN Place 1 drop into both eyes 2 (two) times daily.  Marland Kitchen Co-Enzyme Q-10 30 MG CAPS Take 30 mg by mouth daily.  . dorzolamide-timolol (COSOPT) 22.3-6.8 MG/ML ophthalmic  solution INSTILL 1 DROP INTO BOTH EYES TWICE A DAY  . glipiZIDE (GLUCOTROL) 5 MG tablet TAKE 1 TABLET(5 MG) BY MOUTH DAILY BEFORE BREAKFAST (Patient taking differently: Take 5 mg by mouth 2 (two) times daily before a meal. )  . hydrochlorothiazide (HYDRODIURIL) 25 MG tablet Take 1 tablet (25 mg total) by mouth daily. Please call and schedule an office visit for further refills 1st attempt  . latanoprost (XALATAN) 0.005 % ophthalmic solution   . losartan (COZAAR) 100 MG tablet Take 1 tablet (100 mg total) by mouth daily.  . metFORMIN (GLUCOPHAGE) 500 MG tablet Take 1 tablet (500 mg total) by mouth 2 (two) times daily with a meal.  . metoprolol succinate (TOPROL-XL) 50 MG 24 hr tablet Take 1 tablet (50 mg total) by mouth daily. Take with or immediately following a meal.  . Multiple Vitamin (MULTIVITAMIN) tablet Take 1 tablet by mouth daily.  Marland Kitchen PRESCRIPTION MEDICATION Timolol 0.05 % taking  1 drop to each eye every am  . Travoprost, BAK Free, (TRAVATAN) 0.004 % SOLN ophthalmic solution Place 1 drop into both eyes at bedtime.  . [DISCONTINUED] metoprolol succinate (TOPROL-XL) 50 MG 24 hr tablet TAKE 1 TABLET BY MOUTH EVERY DAY    Allergies  Allergen Reactions  . Lisinopril Cough  . Metformin And Related     Diarrhea on '1000mg'$  BID, tolerates 500 mg BID     SOCIAL HISTORY/FAMILY HISTORY   Social History   Tobacco Use  . Smoking status: Former Smoker    Packs/day: 0.50    Years: 20.00    Pack years: 10.00    Types: Cigarettes    Quit date: 07/13/1981    Years since quitting: 37.8  . Smokeless tobacco: Never Used  Substance Use Topics  . Alcohol use: No  . Drug use: No   Social History   Social History Narrative   SHE IS MARRIED ,MOTHER OF 4, GRANDMOTHER OF 6. SHE DOES EXERCISE BUT SOMEWHAT LIMITED BECAUSE OF HER KNEE (S/P SURGERY 2015).   SHE QUIT SMOKING IN 1980 AND SHE DOES NOT [place]    Family History family history includes Diabetes in her brother, mother, and sister; Heart  disease in her mother; Hyperlipidemia in her brother, mother, sister, and sister; Hypertension in her sister; Prostate cancer in her son.   OBJCTIVE -PE, EKG, labs   Wt Readings from Last 3 Encounters:  05/08/19 196 lb (88.9 kg)  02/22/19 197 lb (89.4 kg)  08/16/18 194 lb 3.2 oz (88.1 kg)    Physical Exam: BP 140/79   Pulse 94   Ht '5\' 5"'$  (1.651 m)   Wt 196 lb (88.9 kg)   LMP  (LMP Unknown)   SpO2 97%   BMI 32.62 kg/m  Physical Exam  Constitutional: She is oriented to person, place, and time. She appears well-developed and well-nourished. No distress.  HENT:  Head: Normocephalic and atraumatic.  Neck: Normal range of motion. Neck supple. No hepatojugular reflux and no JVD present. Carotid bruit is not present. No tracheal deviation present. No thyromegaly present.  Cardiovascular: Normal rate, regular rhythm, normal heart sounds, intact distal pulses and normal pulses.  No extrasystoles are present. PMI is not displaced. Exam reveals no gallop and no friction rub.  No murmur heard. Pulmonary/Chest: Effort normal and breath sounds normal. No respiratory distress. She has no wheezes. She has no rales.  Musculoskeletal: Normal range of motion.        General: No edema.  Neurological: She is alert and oriented to person, place, and time.  Psychiatric: She has a normal mood and affect. Her behavior is normal. Judgment and thought content normal.  Vitals reviewed.    Adult ECG Report  Rate: 94 ;  Rhythm: normal sinus rhythm and Specific ST and T wave changes that appear to be repolarization changes.;   Narrative Interpretation: Stable EKG  Recent Labs:    Lab Results  Component Value Date   CHOL 170 02/22/2019   HDL 65 02/22/2019   LDLCALC 81 02/22/2019   TRIG 138 02/22/2019   CHOLHDL 2.6 02/22/2019   Lab Results  Component Value Date   CREATININE 0.94 02/22/2019   BUN 18 02/22/2019   NA 142 02/22/2019   K 3.5 02/22/2019   CL 101 02/22/2019   CO2 28 02/22/2019   Lab  Results  Component Value Date   HGBA1C 8.2 (A) 02/22/2019    ASSESSMENT/PLAN    Problem List Items Addressed This Visit  Obesity (BMI 30.0-34.9) (Chronic)    Encouraged continued lifestyle modification.  Needs increased exercise.  She is doing a good job with try to control her diet.      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  A little bit higher than usual.  Continue to monitor, no change for now.  She is on amlodipine HCTZ losartan and Toprol.  For her pressure to go higher, but possibly consider increasing beta-blocker dose because of heart rate of 94 at baseline.  We also need to increase amlodipine.      Relevant Medications   metoprolol succinate (TOPROL-XL) 50 MG 24 hr tablet   Other Relevant Orders   EKG 12-Lead (Completed)   Abnormal stress echo EKG portion with normal echo -> imaging confirmed the correct with normal coronaries (Chronic)    Not really ability to do coronary CT angiogram, I think I would probably opt for either Lexiscan Myoview or coronary CTA if symptoms warrant in the future ischemic evaluation.      Dyslipidemia associated with type 2 diabetes mellitus (HCC) (Chronic)    Target LDL is probably is close to 70 if not lower if possible.  Is on atorvastatin 20 mg daily.  Most recent labs showed LDL 81.  Based on her recent cath results, I think were probably okay at this current level, however LDL will go up as needed be more aggressive with statin.  Consider adding SGLT2 inhibitor versus possible GLP-1 agonist for additional diabetic management.      Hypertension associated with diabetes ([place])    A1c stable at 8.2 from 2 years ago.  She is on Glucotrol and Metformin.  Consider SGLT2 inhibitor. Is appropriate on ARB as well as beta-blocker and amlodipine plus HCTZ.  Renal function stable.  Potassium stable.      Relevant Medications   metoprolol succinate (TOPROL-XL) 50 MG 24 hr tablet        COVID-19 Education: The signs and symptoms of COVID-19 were discussed with the patient and how to seek care for testing (follow up with PCP or arrange E-visit).   The importance of social distancing was discussed today.  I spent a total of 45mnutes with the patient and chart review. >  50% of the time was spent in direct patient consultation.  Additional time spent with chart review (studies, outside notes, etc): 6 Total Time: 213m   Current medicines are reviewed at length with the patient today.  (+/- concerns) n/a   Patient Instructions / Medication Changes & Studies & Tests Ordered   Patient Instructions  Medication Instructions:  No changes *If you need a refill on your cardiac medications before your next appointment, please call your pharmacy*  Lab Work: Not needed  Testing/Procedures: Not needed Follow-Up: [facility].  As part of our continuing mission to provide you with exceptional heart care, we have created designated Provider Care Teams.  These Care Teams include your primary Cardiologist (physician) and Advanced Practice Providers (APPs -  Physician Assistants and Nurse Practitioners) who all work together to provide you with the care you need, when you need it.  Your next appointment:   12 month(s)  The format for your next appointment:   In Person  Provider:   DaGlenetta HewMD  Other Instructions     Studies Ordered:   Orders Placed This Encounter  Procedures  . EKG 12-Lead  Glenetta Hew, M.D., M.S. Interventional Cardiologist   Pager # [phone] Phone # [phone] [address] [place], [place] [address]   Thank you for choosing Heartcare at [facility]!!

## 2019-05-10 ENCOUNTER — Encounter: Payer: Self-pay | Admitting: Cardiology

## 2019-05-10 NOTE — Assessment & Plan Note (Signed)
Target LDL is probably is close to 70 if not lower if possible.  Is on atorvastatin 20 mg daily.  Most recent labs showed LDL 81.  Based on her recent cath results, I think were probably okay at this current level, however LDL will go up as needed be more aggressive with statin.  Consider adding SGLT2 inhibitor versus possible GLP-1 agonist for additional diabetic management.

## 2019-05-10 NOTE — Assessment & Plan Note (Deleted)
Target LDL is probably is close to 70 if not lower if possible.  Is on atorvastatin 20 mg daily.  Most recent labs showed LDL 81.  Based on her recent cath results, I think were probably okay at this current level, however LDL will go up as needed be more aggressive with statin.

## 2019-05-10 NOTE — Assessment & Plan Note (Addendum)
Not really ability to do coronary CT angiogram, I think I would probably opt for either [facility] or coronary CTA if symptoms warrant in the future ischemic evaluation.

## 2019-05-10 NOTE — Assessment & Plan Note (Signed)
Up 11 normal blood pressure prior today.  A little bit higher than usual.  Continue to monitor, no change for now.  She is on amlodipine HCTZ losartan and Toprol.  For her pressure to go higher, but possibly consider increasing beta-blocker dose because of heart rate of 94 at baseline.  We also need to increase amlodipine.

## 2019-05-10 NOTE — Assessment & Plan Note (Signed)
Encouraged continued lifestyle modification.  Needs increased exercise.  She is doing a good job with try to control her diet.

## 2019-05-10 NOTE — Assessment & Plan Note (Signed)
A1c stable at 8.2 from 2 years ago.  She is on Glucotrol and Metformin.  Consider SGLT2 inhibitor. Is appropriate on ARB as well as beta-blocker and amlodipine plus HCTZ.  Renal function stable.  Potassium stable.

## 2019-05-13 ENCOUNTER — Other Ambulatory Visit: Payer: Self-pay | Admitting: Emergency Medicine

## 2019-05-14 NOTE — Telephone Encounter (Signed)
Requested medication (s) are due for refill today: no  Requested medication (s) are on the active medication list: yes  Last refill:  03/29/2019  Future visit scheduled: yes  Notes to clinic: patient has upcoming appointment    Requested Prescriptions  Pending Prescriptions Disp Refills   glipiZIDE (GLUCOTROL) 5 MG tablet [Pharmacy Med Name: GLIPIZIDE 5MG  TABLETS] 90 tablet 0    Sig: TAKE 1 TABLET(5 MG) BY MOUTH DAILY BEFORE BREAKFAST      Endocrinology:  Diabetes - Sulfonylureas Failed - 05/13/2019  5:18 PM      Failed - HBA1C is between 0 and 7.9 and within 180 days    Hemoglobin A1C  Date Value Ref Range Status  02/22/2019 8.2 (A) 4.0 - 5.6 % Final   Hgb A1c MFr Bld  Date Value Ref Range Status  11/10/2017 7.3 (H) 4.8 - 5.6 % Final    Comment:             Prediabetes: 5.7 - 6.4          Diabetes: >6.4          Glycemic control for adults with diabetes: <7.0           Passed - Valid encounter within last 6 months    Recent Outpatient Visits           2 months ago Type 2 diabetes mellitus with hyperglycemia, without long-term current use of insulin ([place])   Primary Care at [facility], [place], [place]   5 months ago Type 2 diabetes mellitus with hyperglycemia, without long-term current use of insulin [facility])   Primary Care at [place], [place], [place]   9 months ago Type 2 diabetes mellitus with hyperglycemia, without long-term current use of insulin [facility])   Primary Care at [facility], [place], [place]   12 months ago Type 2 diabetes mellitus without complication, without long-term current use of insulin [facility])   Primary Care at [place], [place], [place]   1 year ago Type 2 diabetes mellitus without complication, without long-term current use of insulin [facility])   Primary Care at [facility], [place], PA-C       Future Appointments             In 1 week [place], Ines Bloomer, MD Primary Care at [place], [facility]

## 2019-05-23 ENCOUNTER — Ambulatory Visit: Payer: Medicare Other | Admitting: Emergency Medicine

## 2019-05-23 ENCOUNTER — Encounter: Payer: Self-pay | Admitting: Emergency Medicine

## 2019-05-23 ENCOUNTER — Other Ambulatory Visit: Payer: Self-pay

## 2019-05-23 VITALS — BP 128/75 | HR 78 | Temp 97.3°F | Resp 16 | Ht 65.0 in | Wt 194.0 lb

## 2019-05-23 DIAGNOSIS — I152 Hypertension secondary to endocrine disorders: Secondary | ICD-10-CM

## 2019-05-23 DIAGNOSIS — E1165 Type 2 diabetes mellitus with hyperglycemia: Secondary | ICD-10-CM

## 2019-05-23 DIAGNOSIS — I119 Hypertensive heart disease without heart failure: Secondary | ICD-10-CM | POA: Diagnosis not present

## 2019-05-23 DIAGNOSIS — E1159 Type 2 diabetes mellitus with other circulatory complications: Secondary | ICD-10-CM

## 2019-05-23 DIAGNOSIS — E1169 Type 2 diabetes mellitus with other specified complication: Secondary | ICD-10-CM

## 2019-05-23 DIAGNOSIS — E785 Hyperlipidemia, unspecified: Secondary | ICD-10-CM

## 2019-05-23 DIAGNOSIS — I10 Essential (primary) hypertension: Secondary | ICD-10-CM

## 2019-05-23 LAB — GLUCOSE, POCT (MANUAL RESULT ENTRY): POC Glucose: 188 mg/dl — AB (ref 70–99)

## 2019-05-23 LAB — POCT GLYCOSYLATED HEMOGLOBIN (HGB A1C): Hemoglobin A1C: 7.9 % — AB (ref 4.0–5.6)

## 2019-05-23 MED ORDER — TRULICITY 0.75 MG/0.5ML ~~LOC~~ SOAJ: 0.7500 mg | SUBCUTANEOUS | 1 refills | Status: AC

## 2019-05-23 MED ORDER — HYDROCHLOROTHIAZIDE 25 MG PO TABS: 25.0000 mg | ORAL_TABLET | Freq: Every day | ORAL | 3 refills | Status: DC

## 2019-05-23 MED ORDER — GLIPIZIDE 5 MG PO TABS: 5.0000 mg | ORAL_TABLET | Freq: Two times a day (BID) | ORAL | 1 refills | Status: DC

## 2019-05-23 NOTE — Assessment & Plan Note (Signed)
Well-controlled blood pressure.  Continue present medication.  No changes. Uncontrolled diabetes with hemoglobin A1c at 7.9.  Continue metformin and glipizide.  Will add Trulicity [id number] mg weekly.  Diet and nutrition discussed with patient. Follow-up in 3 months.

## 2019-05-23 NOTE — Patient Instructions (Addendum)
   If you have lab work done today you will be contacted with your lab results within the next 2 weeks.  If you have not heard from us then please contact us. The fastest way to get your results is to register for My Chart.   IF you received an x-ray today, you will receive an invoice from [facility]. Please contact [facility] at [phone] with questions or concerns regarding your invoice.   IF you received labwork today, you will receive an invoice from [facility]. Please contact [facility] at [phone] with questions or concerns regarding your invoice.   Our billing staff will not be able to assist you with questions regarding bills from these companies.  You will be contacted with the lab results as soon as they are available. The fastest way to get your results is to activate your My Chart account. Instructions are located on the last page of this paperwork. If you have not heard from us regarding the results in 2 weeks, please contact this office.     Diabetes Mellitus and Nutrition, Adult When you have diabetes (diabetes mellitus), it is very important to have healthy eating habits because your blood sugar (glucose) levels are greatly affected by what you eat and drink. Eating healthy foods in the appropriate amounts, at about the same times every day, can help you:  Control your blood glucose.  Lower your risk of heart disease.  Improve your blood pressure.  Reach or maintain a healthy weight. Every person with diabetes is different, and each person has different needs for a meal plan. Your health care provider may recommend that you work with a diet and nutrition specialist (dietitian) to make a meal plan that is best for you. Your meal plan may vary depending on factors such as:  The calories you need.  The medicines you take.  Your weight.  Your blood glucose, blood pressure, and cholesterol levels.  Your activity level.  Other health conditions  you have, such as heart or kidney disease. How do carbohydrates affect me? Carbohydrates, also called carbs, affect your blood glucose level more than any other type of food. Eating carbs naturally raises the amount of glucose in your blood. Carb counting is a method for keeping track of how many carbs you eat. Counting carbs is important to keep your blood glucose at a healthy level, especially if you use insulin or take certain oral diabetes medicines. It is important to know how many carbs you can safely have in each meal. This is different for every person. Your dietitian can help you calculate how many carbs you should have at each meal and for each snack. Foods that contain carbs include:  Bread, cereal, rice, pasta, and crackers.  Potatoes and corn.  Peas, beans, and lentils.  Milk and yogurt.  Fruit and juice.  Desserts, such as cakes, cookies, ice cream, and candy. How does alcohol affect me? Alcohol can cause a sudden decrease in blood glucose (hypoglycemia), especially if you use insulin or take certain oral diabetes medicines. Hypoglycemia can be a life-threatening condition. Symptoms of hypoglycemia (sleepiness, dizziness, and confusion) are similar to symptoms of having too much alcohol. If your health care provider says that alcohol is safe for you, follow these guidelines:  Limit alcohol intake to no more than 1 drink per day for nonpregnant women and 2 drinks per day for men. One drink equals 12 oz of beer, 5 oz of wine, or 1 oz of hard liquor.    Do not drink on an empty stomach.  Keep yourself hydrated with water, diet soda, or unsweetened iced tea.  Keep in mind that regular soda, juice, and other mixers may contain a lot of sugar and must be counted as carbs. What are tips for following this plan?  Reading food labels  Start by checking the serving size on the "Nutrition Facts" label of packaged foods and drinks. The amount of calories, carbs, fats, and other  nutrients listed on the label is based on one serving of the item. Many items contain more than one serving per package.  Check the total grams (g) of carbs in one serving. You can calculate the number of servings of carbs in one serving by dividing the total carbs by 15. For example, if a food has 30 g of total carbs, it would be equal to 2 servings of carbs.  Check the number of grams (g) of saturated and trans fats in one serving. Choose foods that have low or no amount of these fats.  Check the number of milligrams (mg) of salt (sodium) in one serving. Most people should limit total sodium intake to less than 2,300 mg per day.  Always check the nutrition information of foods labeled as "low-fat" or "nonfat". These foods may be higher in added sugar or refined carbs and should be avoided.  Talk to your dietitian to identify your daily goals for nutrients listed on the label. Shopping  Avoid buying canned, premade, or processed foods. These foods tend to be high in fat, sodium, and added sugar.  Shop around the outside edge of the grocery store. This includes fresh fruits and vegetables, bulk grains, fresh meats, and fresh dairy. Cooking  Use low-heat cooking methods, such as baking, instead of high-heat cooking methods like deep frying.  Cook using healthy oils, such as olive, canola, or sunflower oil.  Avoid cooking with butter, cream, or high-fat meats. Meal planning  Eat meals and snacks regularly, preferably at the same times every day. Avoid going long periods of time without eating.  Eat foods high in fiber, such as fresh fruits, vegetables, beans, and whole grains. Talk to your dietitian about how many servings of carbs you can eat at each meal.  Eat 4-6 ounces (oz) of lean protein each day, such as lean meat, chicken, fish, eggs, or tofu. One oz of lean protein is equal to: ? 1 oz of meat, chicken, or fish. ? 1 egg. ?  cup of tofu.  Eat some foods each day that contain  healthy fats, such as avocado, nuts, seeds, and fish. Lifestyle  Check your blood glucose regularly.  Exercise regularly as told by your health care provider. This may include: ? 150 minutes of moderate-intensity or vigorous-intensity exercise each week. This could be brisk walking, biking, or water aerobics. ? Stretching and doing strength exercises, such as yoga or weightlifting, at least 2 times a week.  Take medicines as told by your health care provider.  Do not use any products that contain nicotine or tobacco, such as cigarettes and e-cigarettes. If you need help quitting, ask your health care provider.  Work with a counselor or diabetes educator to identify strategies to manage stress and any emotional and social challenges. Questions to ask a health care provider  Do I need to meet with a diabetes educator?  Do I need to meet with a dietitian?  What number can I call if I have questions?  When are the best times to   check my blood glucose? Where to find more information:  American Diabetes Association: diabetes.org  Academy of Nutrition and Dietetics: www.eatright.org  National Institute of Diabetes and Digestive and Kidney Diseases (NIH): www.niddk.nih.gov Summary  A healthy meal plan will help you control your blood glucose and maintain a healthy lifestyle.  Working with a diet and nutrition specialist (dietitian) can help you make a meal plan that is best for you.  Keep in mind that carbohydrates (carbs) and alcohol have immediate effects on your blood glucose levels. It is important to count carbs and to use alcohol carefully. This information is not intended to replace advice given to you by your health care provider. Make sure you discuss any questions you have with your health care provider. Document Released: 02/12/2005 Document Revised: 04/30/2017 Document Reviewed: 06/22/2016 Elsevier Patient Education  2020 [employer]  

## 2019-05-23 NOTE — Progress Notes (Signed)
Lab Results  Component Value Date   HGBA1C 8.2 (A) 02/22/2019   BP Readings from Last 3 Encounters:  05/23/19 128/75  05/08/19 140/79  02/22/19 124/76   Angela Chambers 78 y.o.   Chief Complaint  Patient presents with  . Diabetes    3 MONTH FOLLOW UP with labs  . Hypertension    and Medication refill - PEND    HISTORY OF PRESENT ILLNESS: This is a 78 y.o. female with history of diabetes and hypertension here for follow-up and medication refill. 1.  Diabetes: On Metformin 500 mg twice a day and glipizide 5 mg twice a day. Lab Results  Component Value Date   HGBA1C 8.2 (A) 02/22/2019    2.  Hypertension: On losartan 100 mg once a day on amlodipine 5 mg once a day.  Also takes 1 baby aspirin a day and metoprolol 50 mg daily. BP Readings from Last 3 Encounters:  05/23/19 128/75  05/08/19 140/79  02/22/19 124/76    3.  Dyslipidemia: On Lipitor 20 mg once a day. Lab Results  Component Value Date   CHOL 170 02/22/2019   HDL 65 02/22/2019   LDLCALC 81 02/22/2019   TRIG 138 02/22/2019   CHOLHDL 2.6 02/22/2019    Has no complaints or medical concerns today.  HPI   Prior to Admission medications   Medication Sig Start Date End Date Taking? Authorizing Provider  amLODipine (NORVASC) 5 MG tablet Take 1 tablet (5 mg total) by mouth daily. 09/03/18  Yes SagardiaInes Bloomer, MD  aspirin 81 MG tablet Take 81 mg by mouth daily.   Yes [provider]  atorvastatin (LIPITOR) 20 MG tablet Take 1 tablet (20 mg total) by mouth daily. 09/03/18  Yes Chrisopher Pustejovsky, Ines Bloomer, MD  brimonidine (ALPHAGAN P) 0.1 % SOLN Place 1 drop into both eyes 2 (two) times daily.   Yes [provider]  Co-Enzyme Q-10 30 MG CAPS Take 30 mg by mouth daily.   Yes [provider]  dorzolamide-timolol (COSOPT) 22.3-6.8 MG/ML ophthalmic solution INSTILL 1 DROP INTO BOTH EYES TWICE A DAY 09/23/17  Yes [provider]  glipiZIDE (GLUCOTROL) 5 MG tablet Take 1 tablet (5 mg  total) by mouth 2 (two) times daily before a meal. 05/23/19 08/21/19 Yes Heba Ige, Ines Bloomer, MD  latanoprost (XALATAN) 0.005 % ophthalmic solution  09/21/18  Yes [provider]  losartan (COZAAR) 100 MG tablet Take 1 tablet (100 mg total) by mouth daily. 07/04/18  Yes Jove Beyl, Ines Bloomer, MD  metoprolol succinate (TOPROL-XL) 50 MG 24 hr tablet Take 1 tablet (50 mg total) by mouth daily. Take with or immediately following a meal. 05/08/19  Yes Leonie Man, MD  Multiple Vitamin (MULTIVITAMIN) tablet Take 1 tablet by mouth daily.   Yes [provider]  PRESCRIPTION MEDICATION Timolol 0.05 % taking 1 drop to each eye every am   Yes [provider]  Blood Glucose Monitoring Suppl (BLOOD GLUCOSE MONITOR SYSTEM) w/Device KIT 1 Units by Does not apply route 2 (two) times daily. 02/07/18   Tenna Delaine D, PA-C  hydrochlorothiazide (HYDRODIURIL) 25 MG tablet Take 1 tablet (25 mg total) by mouth daily. 05/23/19 08/21/19  Horald Pollen, MD  metFORMIN (GLUCOPHAGE) 500 MG tablet Take 1 tablet (500 mg total) by mouth 2 (two) times daily with a meal. 08/16/18 05/08/19  Adilenne Ashworth, Ines Bloomer, MD  Travoprost, BAK Free, (TRAVATAN) 0.004 % SOLN ophthalmic solution Place 1 drop into both eyes at bedtime.  [provider]    Allergies  Allergen Reactions  . Lisinopril Cough  . Metformin And Related     Diarrhea on 1069m BID, tolerates 500 mg BID    Patient Active Problem List   Diagnosis Date Noted  . Hypertension associated with diabetes ([place] 08/16/2018  . Abnormal stress echo EKG portion with normal echo -> imaging confirmed the correct with normal coronaries 06/21/2015  . OA (osteoarthritis) of knee 09/25/2013  . Obesity (BMI 30.0-34.9)   . Hypertensive heart disease without CHF 11/30/2011  . Hyperlipidemia 08/25/2011  . Other and combined forms of senile cataract 07/04/2011  . Primary open angle glaucoma of both eyes, severe stage 07/04/2011  .  Dyslipidemia associated with type 2 diabetes mellitus ([place] 02/22/2009  . PERSONAL HISTORY OF COLONIC POLYPS 02/22/2009    Past Medical History:  Diagnosis Date  . Arthritis   . Cataract   . Diabetes mellitus   . Fibroids   . Glaucoma   . Hyperlipidemia   . Hypertension 07 /2013    WITH EF GREATER THAN 55 % WITH MILD MR BUT NO PROLASPE ,ESSENTIALLY NORMAL DONE TO EVALUATE PALPITATIONS .  .Marland KitchenObesity (BMI 30.0-34.9)   . Osteoporosis   . Palpitations    CONTROLLED ON BETA BLOCKER    Past Surgical History:  Procedure Laterality Date  . ABDOMINAL HYSTERECTOMY  10/18/ 1982   TAH-BSO for fibroids and adenomatous hyperplasia  . CARDIAC CATHETERIZATION  08/2009   NONOBSTRUCTIVE CAD WITH 20% IN THE LAD AND 20 % IN THE RCA NORMAL EF 65%  . CARDIAC CATHETERIZATION N/A 07/05/2015   Procedure: Left Heart Cath and Coronary Angiography;  Surgeon: DLeonie Man MD;  [facility];  Service: Cardiovascular: Angiographically minimal CAD. EF 60-65%  . CPET-MET Test (Cardiopulmonary Exercise Test)  03/06/2013   Adequate effort at 1.11. Peak VO2 12.3 is 86% in the normal range. Heart rate was just outside the normal range target being 127 beats a minute but this was 86% of projected. She did have an ischemic pattern after the anterograde threshold with increased heart rate and blunting of stroke volume during the last 3 minutes of exercise, but was asymptomatic.  .Marland KitchenJOINT REPLACEMENT  09/05/2009   L knee  . KNEE SURGERY  1984   per pt screws in R knee  . NM MYOVIEW LTD  06/2015   FALSE + GXT - Images Accurate:  TM - 6 min, 7 METs (93% MPHR) --> chest discomfort, notable ST depressions => Hight Risk Duke TM Score, but NO scintigraphic evidence of ischemia or infarction. EF 74$  . TOTAL KNEE ARTHROPLASTY Right 09/25/2013   Procedure: RIGHT TOTAL KNEE ARTHROPLASTY;  Surgeon: FGearlean Alf MD;  Location: [facility];  Service: Orthopedics;  Laterality: Right;  . TRANSTHORACIC ECHOCARDIOGRAM   July 2013   EF greater than 55%, mild MR no prolapse.  Normal  . TUBAL LIGATION      Social History   Socioeconomic History  . Marital status: Married    Spouse name: Not on file  . Number of children: 3  . Years of education: Not on file  . Highest education level: Some college, no degree  Occupational History  . Occupation: retired  Tobacco Use  . Smoking status: Former Smoker    Packs/day: 0.50    Years: 20.00    Pack years: 10.00    Types: Cigarettes    Quit date: 07/13/1981    Years since quitting: 37.8  . Smokeless tobacco:  Never Used  Substance and Sexual Activity  . Alcohol use: No  . Drug use: No  . Sexual activity: Yes  Other Topics Concern  . Not on file  Social History Narrative   SHE IS MARRIED ,MOTHER OF 4, GRANDMOTHER OF 6. SHE DOES EXERCISE BUT SOMEWHAT LIMITED BECAUSE OF HER KNEE (S/P SURGERY 2015).   SHE QUIT SMOKING IN 1980 AND SHE DOES NOT DRINK   Social Determinants of Health   Financial Resource Strain:   . Difficulty of Paying Living Expenses: Not on file  Food Insecurity:   . Worried About Charity fundraiser in the Last Year: Not on file  . Ran Out of Food in the Last Year: Not on file  Transportation Needs:   . Lack of Transportation (Medical): Not on file  . Lack of Transportation (Non-Medical): Not on file  Physical Activity:   . Days of Exercise per Week: Not on file  . Minutes of Exercise per Session: Not on file  Stress:   . Feeling of Stress : Not on file  Social Connections:   . Frequency of Communication with Friends and Family: Not on file  . Frequency of Social Gatherings with Friends and Family: Not on file  . Attends Religious Services: Not on file  . Active Member of Clubs or Organizations: Not on file  . Attends Archivist Meetings: Not on file  . Marital Status: Not on file  Intimate Partner Violence:   . Fear of Current or Ex-Partner: Not on file  . Emotionally Abused: Not on file  . Physically Abused: Not  on file  . Sexually Abused: Not on file    Family History  Problem Relation Age of Onset  . Diabetes Mother   . Heart disease Mother   . Hyperlipidemia Mother   . Diabetes Sister   . Hyperlipidemia Sister   . Hypertension Sister   . Diabetes Brother   . Hyperlipidemia Brother   . Prostate cancer Son        prostate  . Hyperlipidemia Sister      Review of Systems  Constitutional: Negative.  Negative for chills and fever.  HENT: Negative.  Negative for congestion and sore throat.   Eyes: Negative.   Respiratory: Negative.  Negative for cough.   Cardiovascular: Negative.   Gastrointestinal: Negative.  Negative for abdominal pain, blood in stool, diarrhea, nausea and vomiting.  Genitourinary: Negative.  Negative for dysuria, frequency and hematuria.  Musculoskeletal: Negative.  Negative for back pain and myalgias.  Skin: Negative.  Negative for rash.  Neurological: Negative.   All other systems reviewed and are negative.  Today's Vitals   05/23/19 1045  BP: 128/75  Pulse: 78  Resp: 16  Temp: (!) 97.3 F (36.3 C)  TempSrc: Oral  SpO2: 98%  Weight: 194 lb (88 kg)  Height: 5' 5"  (1.651 m)   Body mass index is 32.28 kg/m.   Physical Exam Vitals reviewed.  Constitutional:      Appearance: Normal appearance.  HENT:     Head: Normocephalic.  Eyes:     Extraocular Movements: Extraocular movements intact.     Conjunctiva/sclera: Conjunctivae normal.     Pupils: Pupils are equal, round, and reactive to light.  Cardiovascular:     Rate and Rhythm: Normal rate and regular rhythm.     Pulses: Normal pulses.     Heart sounds: Normal heart sounds.  Pulmonary:     Effort: Pulmonary effort is normal.  Breath sounds: Normal breath sounds.  Musculoskeletal:        General: Normal range of motion.     Cervical back: Normal range of motion and neck supple.  Skin:    General: Skin is warm and dry.     Capillary Refill: Capillary refill takes less than 2 seconds.    Neurological:     General: No focal deficit present.     Mental Status: She is alert and oriented to person, place, and time.  Psychiatric:        Mood and Affect: Mood normal.        Behavior: Behavior normal.    Results for orders placed or performed in visit on 05/23/19 (from the past 24 hour(s))  POCT glucose (manual entry)     Status: Abnormal   Collection Time: 05/23/19 11:37 AM  Result Value Ref Range   POC Glucose 188 (A) 70 - 99 mg/dl  POCT glycosylated hemoglobin (Hb A1C)     Status: Abnormal   Collection Time: 05/23/19 11:42 AM  Result Value Ref Range   Hemoglobin A1C 7.9 (A) 4.0 - 5.6 %   HbA1c POC (<> result, manual entry)     HbA1c, POC (prediabetic range)     HbA1c, POC (controlled diabetic range)       ASSESSMENT & PLAN: Hypertension associated with diabetes ([place]) Well-controlled blood pressure.  Continue present medication.  No changes. Uncontrolled diabetes with hemoglobin A1c at 7.9.  Continue metformin and glipizide.  Will add Trulicity 4.54 mg weekly.  Diet and nutrition discussed with patient. Follow-up in 3 months.  Angela Chambers was seen today for diabetes and hypertension.  Diagnoses and all orders for this visit:  Hypertension associated with diabetes ([place]) -     Comprehensive metabolic panel -     glipiZIDE (GLUCOTROL) 5 MG tablet; Take 1 tablet (5 mg total) by mouth 2 (two) times daily before a meal.  Type 2 diabetes mellitus with hyperglycemia, without long-term current use of insulin ([facility]) -     POCT glucose (manual entry) -     POCT glycosylated hemoglobin (Hb A1C) -     Dulaglutide (TRULICITY) 0.98 JX/9.1YN SOPN; Inject 0.75 mg into the skin once a week.  Hypertensive heart disease without CHF -     hydrochlorothiazide (HYDRODIURIL) 25 MG tablet; Take 1 tablet (25 mg total) by mouth daily.  Dyslipidemia associated with type 2 diabetes mellitus ([place]) -     Lipid panel    Patient Instructions       If you have lab work done today you  will be contacted with your lab results within the next 2 weeks.  If you have not heard from [place] then please contact us. The fastest way to get your results is to register for My Chart.   IF you received an x-ray today, you will receive an invoice from [facility]. Please contact [facility] at [phone] with questions or concerns regarding your invoice.   IF you received labwork today, you will receive an invoice from [facility]. Please contact [facility] at [phone] with questions or concerns regarding your invoice.   Our billing staff will not be able to assist you with questions regarding bills from these companies.  You will be contacted with the lab results as soon as they are available. The fastest way to get your results is to activate your My Chart account. Instructions are located on the last page of this paperwork. If you have not heard from [place] regarding  the results in 2 weeks, please contact this office.     Diabetes Mellitus and Nutrition, Adult When you have diabetes (diabetes mellitus), it is very important to have healthy eating habits because your blood sugar (glucose) levels are greatly affected by what you eat and drink. Eating healthy foods in the appropriate amounts, at about the same times every day, can help you:  Control your blood glucose.  Lower your risk of heart disease.  Improve your blood pressure.  Reach or maintain a healthy weight. Every person with diabetes is different, and each person has different needs for a meal plan. Your health care provider may recommend that you work with a diet and nutrition specialist (dietitian) to make a meal plan that is best for you. Your meal plan may vary depending on factors such as:  The calories you need.  The medicines you take.  Your weight.  Your blood glucose, blood pressure, and cholesterol levels.  Your activity level.  Other health conditions you have, such as heart or kidney  disease. How do carbohydrates affect me? Carbohydrates, also called carbs, affect your blood glucose level more than any other type of food. Eating carbs naturally raises the amount of glucose in your blood. Carb counting is a method for keeping track of how many carbs you eat. Counting carbs is important to keep your blood glucose at a healthy level, especially if you use insulin or take certain oral diabetes medicines. It is important to know how many carbs you can safely have in each meal. This is different for every person. Your dietitian can help you calculate how many carbs you should have at each meal and for each snack. Foods that contain carbs include:  Bread, cereal, rice, pasta, and crackers.  Potatoes and corn.  Peas, beans, and lentils.  Milk and yogurt.  Fruit and juice.  Desserts, such as cakes, cookies, ice cream, and candy. How does alcohol affect me? Alcohol can cause a sudden decrease in blood glucose (hypoglycemia), especially if you use insulin or take certain oral diabetes medicines. Hypoglycemia can be a life-threatening condition. Symptoms of hypoglycemia (sleepiness, dizziness, and confusion) are similar to symptoms of having too much alcohol. If your health care provider says that alcohol is safe for you, follow these guidelines:  Limit alcohol intake to no more than 1 drink per day for nonpregnant women and 2 drinks per day for men. One drink equals 12 oz of beer, 5 oz of wine, or 1 oz of hard liquor.  Do not drink on an empty stomach.  Keep yourself hydrated with water, diet soda, or unsweetened iced tea.  Keep in mind that regular soda, juice, and other mixers may contain a lot of sugar and must be counted as carbs. What are tips for following this plan?  Reading food labels  Start by checking the serving size on the "Nutrition Facts" label of packaged foods and drinks. The amount of calories, carbs, fats, and other nutrients listed on the label is based  on one serving of the item. Many items contain more than one serving per package.  Check the total grams (g) of carbs in one serving. You can calculate the number of servings of carbs in one serving by dividing the total carbs by 15. For example, if a food has 30 g of total carbs, it would be equal to 2 servings of carbs.  Check the number of grams (g) of saturated and trans fats in one serving. Choose foods  that have low or no amount of these fats.  Check the number of milligrams (mg) of salt (sodium) in one serving. Most people should limit total sodium intake to less than 2,300 mg per day.  Always check the nutrition information of foods labeled as "low-fat" or "nonfat". These foods may be higher in added sugar or refined carbs and should be avoided.  Talk to your dietitian to identify your daily goals for nutrients listed on the label. Shopping  Avoid buying canned, premade, or processed foods. These foods tend to be high in fat, sodium, and added sugar.  Shop around the outside edge of the grocery store. This includes fresh fruits and vegetables, bulk grains, fresh meats, and fresh dairy. Cooking  Use low-heat cooking methods, such as baking, instead of high-heat cooking methods like deep frying.  Cook using healthy oils, such as olive, canola, or sunflower oil.  Avoid cooking with butter, cream, or high-fat meats. Meal planning  Eat meals and snacks regularly, preferably at the same times every day. Avoid going long periods of time without eating.  Eat foods high in fiber, such as fresh fruits, vegetables, beans, and whole grains. Talk to your dietitian about how many servings of carbs you can eat at each meal.  Eat 4-6 ounces (oz) of lean protein each day, such as lean meat, chicken, fish, eggs, or tofu. One oz of lean protein is equal to: ? 1 oz of meat, chicken, or fish. ? 1 egg. ?  cup of tofu.  Eat some foods each day that contain healthy fats, such as avocado, nuts,  seeds, and fish. Lifestyle  Check your blood glucose regularly.  Exercise regularly as told by your health care provider. This may include: ? 150 minutes of moderate-intensity or vigorous-intensity exercise each week. This could be brisk walking, biking, or water aerobics. ? Stretching and doing strength exercises, such as yoga or weightlifting, at least 2 times a week.  Take medicines as told by your health care provider.  Do not use any products that contain nicotine or tobacco, such as cigarettes and e-cigarettes. If you need help quitting, ask your health care provider.  Work with a Social worker or diabetes educator to identify strategies to manage stress and any emotional and social challenges. Questions to ask a health care provider  Do I need to meet with a diabetes educator?  Do I need to meet with a dietitian?  What number can I call if I have questions?  When are the best times to check my blood glucose? Where to find more information:  American Diabetes Association: diabetes.org  Academy of Nutrition and Dietetics: www.eatright.[employer] of Diabetes and Digestive and Kidney Diseases (NIH): [employer] Summary  A healthy meal plan will help you control your blood glucose and maintain a healthy lifestyle.  Working with a diet and nutrition specialist (dietitian) can help you make a meal plan that is best for you.  Keep in mind that carbohydrates (carbs) and alcohol have immediate effects on your blood glucose levels. It is important to count carbs and to use alcohol carefully. This information is not intended to replace advice given to you by your health care provider. Make sure you discuss any questions you have with your health care provider. Document Released: 02/12/2005 Document Revised: 04/30/2017 Document Reviewed: 06/22/2016 Elsevier Patient Education  2020 Elsevier Inc.      Agustina Caroli, MD [facility]

## 2019-05-24 LAB — COMPREHENSIVE METABOLIC PANEL
ALT: 10 IU/L (ref 0–32)
AST: 9 IU/L (ref 0–40)
Albumin/Globulin Ratio: 1.9 (ref 1.2–2.2)
Albumin: 4.1 g/dL (ref 3.7–4.7)
Alkaline Phosphatase: 116 IU/L (ref 39–117)
BUN/Creatinine Ratio: 19 (ref 12–28)
BUN: 17 mg/dL (ref 8–27)
Bilirubin Total: 0.4 mg/dL (ref 0.0–1.2)
CO2: 24 mmol/L (ref 20–29)
Calcium: 9.9 mg/dL (ref 8.7–10.3)
Chloride: 102 mmol/L (ref 96–106)
Creatinine, Ser: 0.88 mg/dL (ref 0.57–1.00)
GFR calc Af Amer: 73 mL/min/{1.73_m2} (ref >59)
GFR calc non Af Amer: 63 mL/min/{1.73_m2} (ref >59)
Globulin, Total: 2.2 g/dL (ref 1.5–4.5)
Glucose: 187 mg/dL — ABNORMAL HIGH (ref 65–99)
Potassium: 3.9 mmol/L (ref 3.5–5.2)
Sodium: 143 mmol/L (ref 134–144)
Total Protein: 6.3 g/dL (ref 6.0–8.5)

## 2019-05-24 LAB — LIPID PANEL
Chol/HDL Ratio: 2.5 ratio (ref 0.0–4.4)
Cholesterol, Total: 187 mg/dL (ref 100–199)
HDL: 76 mg/dL (ref >39)
LDL Chol Calc (NIH): 94 mg/dL (ref 0–99)
Triglycerides: 97 mg/dL (ref 0–149)
VLDL Cholesterol Cal: 17 mg/dL (ref 5–40)

## 2019-06-25 ENCOUNTER — Other Ambulatory Visit: Payer: Self-pay | Admitting: Emergency Medicine

## 2019-06-25 DIAGNOSIS — I10 Essential (primary) hypertension: Secondary | ICD-10-CM

## 2019-08-22 ENCOUNTER — Other Ambulatory Visit: Payer: Self-pay

## 2019-08-22 ENCOUNTER — Encounter: Payer: Self-pay | Admitting: Emergency Medicine

## 2019-08-22 ENCOUNTER — Ambulatory Visit: Payer: Medicare Other | Admitting: Emergency Medicine

## 2019-08-22 VITALS — BP 123/77 | HR 90 | Temp 98.0°F | Resp 16 | Ht 65.0 in | Wt 192.0 lb

## 2019-08-22 DIAGNOSIS — E1169 Type 2 diabetes mellitus with other specified complication: Secondary | ICD-10-CM | POA: Diagnosis not present

## 2019-08-22 DIAGNOSIS — I119 Hypertensive heart disease without heart failure: Secondary | ICD-10-CM

## 2019-08-22 DIAGNOSIS — E1165 Type 2 diabetes mellitus with hyperglycemia: Secondary | ICD-10-CM | POA: Diagnosis not present

## 2019-08-22 DIAGNOSIS — E785 Hyperlipidemia, unspecified: Secondary | ICD-10-CM

## 2019-08-22 DIAGNOSIS — E1159 Type 2 diabetes mellitus with other circulatory complications: Secondary | ICD-10-CM

## 2019-08-22 DIAGNOSIS — I10 Essential (primary) hypertension: Secondary | ICD-10-CM

## 2019-08-22 LAB — POCT GLYCOSYLATED HEMOGLOBIN (HGB A1C): Hemoglobin A1C: 7.2 % — AB (ref 4.0–5.6)

## 2019-08-22 LAB — GLUCOSE, POCT (MANUAL RESULT ENTRY): POC Glucose: 156 mg/dl — AB (ref 70–99)

## 2019-08-22 MED ORDER — METFORMIN HCL 500 MG PO TABS: 500.0000 mg | ORAL_TABLET | Freq: Two times a day (BID) | ORAL | 3 refills | Status: DC

## 2019-08-22 MED ORDER — TRULICITY 1.5 MG/0.5ML ~~LOC~~ SOAJ: 1.5000 mg | SUBCUTANEOUS | 6 refills | Status: DC

## 2019-08-22 MED ORDER — HYDROCHLOROTHIAZIDE 25 MG PO TABS: 25.0000 mg | ORAL_TABLET | Freq: Every day | ORAL | 3 refills | Status: DC

## 2019-08-22 NOTE — Patient Instructions (Addendum)
   If you have lab work done today you will be contacted with your lab results within the next 2 weeks.  If you have not heard from us then please contact us. The fastest way to get your results is to register for My Chart.   IF you received an x-ray today, you will receive an invoice from [facility]. Please contact  Radiology at [phone] with questions or concerns regarding your invoice.   IF you received labwork today, you will receive an invoice from [facility]. Please contact [facility] at [phone] with questions or concerns regarding your invoice.   Our billing staff will not be able to assist you with questions regarding bills from these companies.  You will be contacted with the lab results as soon as they are available. The fastest way to get your results is to activate your My Chart account. Instructions are located on the last page of this paperwork. If you have not heard from us regarding the results in 2 weeks, please contact this office.     Diabetes Mellitus and Nutrition, Adult When you have diabetes (diabetes mellitus), it is very important to have healthy eating habits because your blood sugar (glucose) levels are greatly affected by what you eat and drink. Eating healthy foods in the appropriate amounts, at about the same times every day, can help you:  Control your blood glucose.  Lower your risk of heart disease.  Improve your blood pressure.  Reach or maintain a healthy weight. Every person with diabetes is different, and each person has different needs for a meal plan. Your health care provider may recommend that you work with a diet and nutrition specialist (dietitian) to make a meal plan that is best for you. Your meal plan may vary depending on factors such as:  The calories you need.  The medicines you take.  Your weight.  Your blood glucose, blood pressure, and cholesterol levels.  Your activity level.  Other health conditions  you have, such as heart or kidney disease. How do carbohydrates affect me? Carbohydrates, also called carbs, affect your blood glucose level more than any other type of food. Eating carbs naturally raises the amount of glucose in your blood. Carb counting is a method for keeping track of how many carbs you eat. Counting carbs is important to keep your blood glucose at a healthy level, especially if you use insulin or take certain oral diabetes medicines. It is important to know how many carbs you can safely have in each meal. This is different for every person. Your dietitian can help you calculate how many carbs you should have at each meal and for each snack. Foods that contain carbs include:  Bread, cereal, rice, pasta, and crackers.  Potatoes and corn.  Peas, beans, and lentils.  Milk and yogurt.  Fruit and juice.  Desserts, such as cakes, cookies, ice cream, and candy. How does alcohol affect me? Alcohol can cause a sudden decrease in blood glucose (hypoglycemia), especially if you use insulin or take certain oral diabetes medicines. Hypoglycemia can be a life-threatening condition. Symptoms of hypoglycemia (sleepiness, dizziness, and confusion) are similar to symptoms of having too much alcohol. If your health care provider says that alcohol is safe for you, follow these guidelines:  Limit alcohol intake to no more than 1 drink per day for nonpregnant women and 2 drinks per day for men. One drink equals 12 oz of beer, 5 oz of wine, or 1 oz of hard liquor.    Do not drink on an empty stomach.  Keep yourself hydrated with water, diet soda, or unsweetened iced tea.  Keep in mind that regular soda, juice, and other mixers may contain a lot of sugar and must be counted as carbs. What are tips for following this plan?  Reading food labels  Start by checking the serving size on the "Nutrition Facts" label of packaged foods and drinks. The amount of calories, carbs, fats, and other  nutrients listed on the label is based on one serving of the item. Many items contain more than one serving per package.  Check the total grams (g) of carbs in one serving. You can calculate the number of servings of carbs in one serving by dividing the total carbs by 15. For example, if a food has 30 g of total carbs, it would be equal to 2 servings of carbs.  Check the number of grams (g) of saturated and trans fats in one serving. Choose foods that have low or no amount of these fats.  Check the number of milligrams (mg) of salt (sodium) in one serving. Most people should limit total sodium intake to less than 2,300 mg per day.  Always check the nutrition information of foods labeled as "low-fat" or "nonfat". These foods may be higher in added sugar or refined carbs and should be avoided.  Talk to your dietitian to identify your daily goals for nutrients listed on the label. Shopping  Avoid buying canned, premade, or processed foods. These foods tend to be high in fat, sodium, and added sugar.  Shop around the outside edge of the grocery store. This includes fresh fruits and vegetables, bulk grains, fresh meats, and fresh dairy. Cooking  Use low-heat cooking methods, such as baking, instead of high-heat cooking methods like deep frying.  Cook using healthy oils, such as olive, canola, or sunflower oil.  Avoid cooking with butter, cream, or high-fat meats. Meal planning  Eat meals and snacks regularly, preferably at the same times every day. Avoid going long periods of time without eating.  Eat foods high in fiber, such as fresh fruits, vegetables, beans, and whole grains. Talk to your dietitian about how many servings of carbs you can eat at each meal.  Eat 4-6 ounces (oz) of lean protein each day, such as lean meat, chicken, fish, eggs, or tofu. One oz of lean protein is equal to: ? 1 oz of meat, chicken, or fish. ? 1 egg. ?  cup of tofu.  Eat some foods each day that contain  healthy fats, such as avocado, nuts, seeds, and fish. Lifestyle  Check your blood glucose regularly.  Exercise regularly as told by your health care provider. This may include: ? 150 minutes of moderate-intensity or vigorous-intensity exercise each week. This could be brisk walking, biking, or water aerobics. ? Stretching and doing strength exercises, such as yoga or weightlifting, at least 2 times a week.  Take medicines as told by your health care provider.  Do not use any products that contain nicotine or tobacco, such as cigarettes and e-cigarettes. If you need help quitting, ask your health care provider.  Work with a counselor or diabetes educator to identify strategies to manage stress and any emotional and social challenges. Questions to ask a health care provider  Do I need to meet with a diabetes educator?  Do I need to meet with a dietitian?  What number can I call if I have questions?  When are the best times to   times to check my blood glucose? Where to find more information:  American Diabetes Association: diabetes.org  Academy of Nutrition and Dietetics: www.eatright.org  National Institute of Diabetes and Digestive and Kidney Diseases (NIH): www.niddk.nih.gov Summary  A healthy meal plan will help you control your blood glucose and maintain a healthy lifestyle.  Working with a diet and nutrition specialist (dietitian) can help you make a meal plan that is best for you.  Keep in mind that carbohydrates (carbs) and alcohol have immediate effects on your blood glucose levels. It is important to count carbs and to use alcohol carefully. This information is not intended to replace advice given to you by your health care provider. Make sure you discuss any questions you have with your health care provider. Document Revised: 04/30/2017 Document Reviewed: 06/22/2016 [employer] Patient Education  2020 [employer]  

## 2019-08-22 NOTE — Progress Notes (Signed)
Angela Chambers 79 y.o.   Chief Complaint  Patient presents with  . Diabetes    3 MONTH FOLLOW UP  . Medication Refill    Trulicity, Hctz and Metformin    HISTORY OF PRESENT ILLNESS: This is a 79 y.o. female with history of diabetes and hypertension and dyslipidemia here for follow-up and medication refill. 1.  Diabetes: Taking Metformin 500 mg twice a day, glipizide 5 mg twice a day, and Trulicity 5.40 mg weekly.  Tolerating all medications well.  Fasting blood sugars at home 120s.  Afternoons 160s. Lab Results  Component Value Date   HGBA1C 7.9 (A) 05/23/2019    2.  Hypertension: On hydrochlorothiazide 25 mg daily, losartan 100 mg daily, and amlodipine 5 mg daily.  Also takes 1 baby aspirin daily.  Also takes metoprolol succinate 50 mg daily. BP Readings from Last 3 Encounters:  08/22/19 123/77  05/23/19 128/75  05/08/19 140/79   Lab Results  Component Value Date   CREATININE 0.88 05/23/2019   BUN 17 05/23/2019   NA 143 05/23/2019   K 3.9 05/23/2019   CL 102 05/23/2019   CO2 24 05/23/2019    3.  Dyslipidemia: On Lipitor 20 mg daily. Lab Results  Component Value Date   CHOL 187 05/23/2019   HDL 76 05/23/2019   LDLCALC 94 05/23/2019   TRIG 97 05/23/2019   CHOLHDL 2.5 05/23/2019   Fully vaccinated against Covid.  HPI   Prior to Admission medications   Medication Sig Start Date End Date Taking? Authorizing Provider  amLODipine (NORVASC) 5 MG tablet Take 1 tablet (5 mg total) by mouth daily. 09/03/18  Yes SagardiaInes Bloomer, MD  aspirin 81 MG tablet Take 81 mg by mouth daily.   Yes [provider]  atorvastatin (LIPITOR) 20 MG tablet Take 1 tablet (20 mg total) by mouth daily. 09/03/18  Yes Sagardia, Ines Bloomer, MD  brimonidine (ALPHAGAN P) 0.1 % SOLN Place 1 drop into both eyes 2 (two) times daily.   Yes [provider]  Co-Enzyme Q-10 30 MG CAPS Take 30 mg by mouth daily.   Yes [provider]  dorzolamide-timolol (COSOPT)  22.3-6.8 MG/ML ophthalmic solution INSTILL 1 DROP INTO BOTH EYES TWICE A DAY 09/23/17  Yes [provider]  Dulaglutide (TRULICITY) 9.81 XB/1.4NW SOPN Inject into the skin once a week.   Yes [provider]  latanoprost (XALATAN) 0.005 % ophthalmic solution  09/21/18  Yes [provider]  losartan (COZAAR) 100 MG tablet TAKE 1 TABLET(100 MG) BY MOUTH DAILY 06/25/19  Yes Sagardia, Ines Bloomer, MD  metoprolol succinate (TOPROL-XL) 50 MG 24 hr tablet Take 1 tablet (50 mg total) by mouth daily. Take with or immediately following a meal. 05/08/19  Yes Leonie Man, MD  Multiple Vitamin (MULTIVITAMIN) tablet Take 1 tablet by mouth daily.   Yes [provider]  Travoprost, BAK Free, (TRAVATAN) 0.004 % SOLN ophthalmic solution Place 1 drop into both eyes at bedtime.   Yes [provider]  Blood Glucose Monitoring Suppl (BLOOD GLUCOSE MONITOR SYSTEM) w/Device KIT 1 Units by Does not apply route 2 (two) times daily. 02/07/18   Tenna Delaine D, PA-C  glipiZIDE (GLUCOTROL) 5 MG tablet Take 1 tablet (5 mg total) by mouth 2 (two) times daily before a meal. 05/23/19 08/21/19  Sagardia, Ines Bloomer, MD  hydrochlorothiazide (HYDRODIURIL) 25 MG tablet Take 1 tablet (25 mg total) by mouth daily. 05/23/19 08/21/19  Horald Pollen, MD  metFORMIN (GLUCOPHAGE) 500 MG tablet  Take 1 tablet (500 mg total) by mouth 2 (two) times daily with a meal. 08/16/18 05/08/19  Horald Pollen, MD  PRESCRIPTION MEDICATION Timolol 0.05 % taking 1 drop to each eye every am    [provider]    Allergies  Allergen Reactions  . Lisinopril Cough  . Metformin And Related     Diarrhea on 1076m BID, tolerates 500 mg BID    Patient Active Problem List   Diagnosis Date Noted  . Hypertension associated with diabetes ([facility] 08/16/2018  . Abnormal stress echo EKG portion with normal echo -> imaging confirmed the correct with normal coronaries 06/21/2015  . OA (osteoarthritis)  of knee 09/25/2013  . Obesity (BMI 30.0-34.9)   . Hypertensive heart disease without CHF 11/30/2011  . Hyperlipidemia 08/25/2011  . Other and combined forms of senile cataract 07/04/2011  . Primary open angle glaucoma of both eyes, severe stage 07/04/2011  . Dyslipidemia associated with type 2 diabetes mellitus ([place] 02/22/2009  . PERSONAL HISTORY OF COLONIC POLYPS 02/22/2009    Past Medical History:  Diagnosis Date  . Arthritis   . Cataract   . Diabetes mellitus   . Fibroids   . Glaucoma   . Hyperlipidemia   . Hypertension 07 /2013    WITH EF GREATER THAN 55 % WITH MILD MR BUT NO PROLASPE ,ESSENTIALLY NORMAL DONE TO EVALUATE PALPITATIONS .  .Marland KitchenObesity (BMI 30.0-34.9)   . Osteoporosis   . Palpitations    CONTROLLED ON BETA BLOCKER    Past Surgical History:  Procedure Laterality Date  . ABDOMINAL HYSTERECTOMY  10/18/ 1982   TAH-BSO for fibroids and adenomatous hyperplasia  . CARDIAC CATHETERIZATION  08/2009   NONOBSTRUCTIVE CAD WITH 20% IN THE LAD AND 20 % IN THE RCA NORMAL EF 65%  . CARDIAC CATHETERIZATION N/A 07/05/2015   Procedure: Left Heart Cath and Coronary Angiography;  Surgeon: DLeonie Man MD;  [facility];  Service: Cardiovascular: Angiographically minimal CAD. EF 60-65%  . CPET-MET Test (Cardiopulmonary Exercise Test)  03/06/2013   Adequate effort at 1.11. Peak VO2 12.3 is 86% in the normal range. Heart rate was just outside the normal range target being 127 beats a minute but this was 86% of projected. She did have an ischemic pattern after the anterograde threshold with increased heart rate and blunting of stroke volume during the last 3 minutes of exercise, but was asymptomatic.  .Marland KitchenJOINT REPLACEMENT  09/05/2009   L knee  . KNEE SURGERY  1984   per pt screws in R knee  . NM MYOVIEW LTD  06/2015   FALSE + GXT - Images Accurate:  TM - 6 min, 7 METs (93% MPHR) --> chest discomfort, notable ST depressions => Hight Risk Duke TM Score, but NO  scintigraphic evidence of ischemia or infarction. EF 74$  . TOTAL KNEE ARTHROPLASTY Right 09/25/2013   Procedure: RIGHT TOTAL KNEE ARTHROPLASTY;  Surgeon: FGearlean Alf MD;  Location: [facility];  Service: Orthopedics;  Laterality: Right;  . TRANSTHORACIC ECHOCARDIOGRAM  July 2013   EF greater than 55%, mild MR no prolapse.  Normal  . TUBAL LIGATION      Social History   Socioeconomic History  . Marital status: Married    Spouse name: Not on file  . Number of children: 3  . Years of education: Not on file  . Highest education level: Some college, no degree  Occupational History  . Occupation: retired  Tobacco Use  . Smoking status: Former  Smoker    Packs/day: 0.50    Years: 20.00    Pack years: 10.00    Types: Cigarettes    Quit date: 07/13/1981    Years since quitting: 38.1  . Smokeless tobacco: Never Used  Substance and Sexual Activity  . Alcohol use: No  . Drug use: No  . Sexual activity: Yes  Other Topics Concern  . Not on file  Social History Narrative   SHE IS MARRIED ,MOTHER OF 4, GRANDMOTHER OF 6. SHE DOES EXERCISE BUT SOMEWHAT LIMITED BECAUSE OF HER KNEE (S/P SURGERY 2015).   SHE QUIT SMOKING IN 1980 AND SHE DOES NOT DRINK   Social Determinants of Health   Financial Resource Strain:   . Difficulty of Paying Living Expenses:   Food Insecurity:   . Worried About Charity fundraiser in the Last Year:   . Arboriculturist in the Last Year:   Transportation Needs:   . Film/video editor (Medical):   Marland Kitchen Lack of Transportation (Non-Medical):   Physical Activity:   . Days of Exercise per Week:   . Minutes of Exercise per Session:   Stress:   . Feeling of Stress :   Social Connections:   . Frequency of Communication with Friends and Family:   . Frequency of Social Gatherings with Friends and Family:   . Attends Religious Services:   . Active Member of Clubs or Organizations:   . Attends Archivist Meetings:   Marland Kitchen Marital Status:   Intimate Partner  Violence:   . Fear of Current or Ex-Partner:   . Emotionally Abused:   Marland Kitchen Physically Abused:   . Sexually Abused:     Family History  Problem Relation Age of Onset  . Diabetes Mother   . Heart disease Mother   . Hyperlipidemia Mother   . Diabetes Sister   . Hyperlipidemia Sister   . Hypertension Sister   . Diabetes Brother   . Hyperlipidemia Brother   . Prostate cancer Son        prostate  . Hyperlipidemia Sister      Review of Systems  Constitutional: Negative.  Negative for chills and fever.  HENT: Negative.  Negative for congestion and sore throat.   Respiratory: Negative.  Negative for cough and shortness of breath.   Cardiovascular: Negative.  Negative for chest pain and palpitations.  Gastrointestinal: Negative.  Negative for abdominal pain, diarrhea, nausea and vomiting.  Genitourinary: Negative.  Negative for dysuria and hematuria.  Musculoskeletal: Negative.  Negative for myalgias and neck pain.  Skin: Negative.   Neurological: Negative.  Negative for dizziness and headaches.  All other systems reviewed and are negative.  Today's Vitals   08/22/19 1107  BP: 123/77  Pulse: 90  Resp: 16  Temp: 98 F (36.7 C)  TempSrc: Temporal  SpO2: 98%  Weight: 192 lb (87.1 kg)  Height: 5' 5" (1.651 m)   Body mass index is 31.95 kg/m.   Physical Exam Vitals reviewed.  Constitutional:      Appearance: Normal appearance.  HENT:     Head: Normocephalic.  Eyes:     Extraocular Movements: Extraocular movements intact.     Pupils: Pupils are equal, round, and reactive to light.  Cardiovascular:     Rate and Rhythm: Normal rate and regular rhythm.     Pulses: Normal pulses.     Heart sounds: Normal heart sounds.  Pulmonary:     Effort: Pulmonary effort is normal.  Breath sounds: Normal breath sounds.  Musculoskeletal:        General: Normal range of motion.     Cervical back: Normal range of motion and neck supple.  Skin:    General: Skin is warm and dry.      Capillary Refill: Capillary refill takes less than 2 seconds.  Neurological:     General: No focal deficit present.     Mental Status: She is alert and oriented to person, place, and time.  Psychiatric:        Mood and Affect: Mood normal.        Behavior: Behavior normal.    Results for orders placed or performed in visit on 08/22/19 (from the past 24 hour(s))  POCT glucose (manual entry)     Status: Abnormal   Collection Time: 08/22/19 11:36 AM  Result Value Ref Range   POC Glucose 156 (A) 70 - 99 mg/dl  POCT glycosylated hemoglobin (Hb A1C)     Status: Abnormal   Collection Time: 08/22/19 11:42 AM  Result Value Ref Range   Hemoglobin A1C 7.2 (A) 4.0 - 5.6 %   HbA1c POC (<> result, manual entry)     HbA1c, POC (prediabetic range)     HbA1c, POC (controlled diabetic range)      A total of 30 minutes was spent with the patient, greater than 50% of which was in counseling/coordination of care regarding diabetes management including diet and medications, review of most recent office visit notes, review of most recent blood results including today's hemoglobin C1K, review of Trulicity and dose change, prognosis and need for follow-up in 3 months.  ASSESSMENT & PLAN: Hypertension associated with diabetes ([place]) Well-controlled hypertension.  Continue present medications.  No changes Diabetes improving with hemoglobin A1c at 7.2, better than before. Continue Metformin and glipizide.  Increase dose of Trulicity to 1.5 mg weekly and continue monitoring blood glucose at home with target fasting morning numbers between 80 and 110.  Diet and nutrition discussed with patient. Follow-up in 3 months.  Angela Chambers was seen today for diabetes and medication refill.  Diagnoses and all orders for this visit:  Hypertension associated with diabetes ([place])  Type 2 diabetes mellitus with hyperglycemia, without long-term current use of insulin (H[facility]) -     metFORMIN (GLUCOPHAGE) 500 MG tablet; Take 1  tablet (500 mg total) by mouth 2 (two) times daily with a meal. -     POCT glucose (manual entry) -     POCT glycosylated hemoglobin (Hb A1C) -     Dulaglutide (TRULICITY) 1.5 GY/1.8HU SOPN; Inject 1.5 mg into the skin once a week.  Hypertensive heart disease without CHF -     hydrochlorothiazide (HYDRODIURIL) 25 MG tablet; Take 1 tablet (25 mg total) by mouth daily.  Dyslipidemia associated with type 2 diabetes mellitus [facility])    Patient Instructions       If you have lab work done today you will be contacted with your lab results within the next 2 weeks.  If you have not heard from [place] then please contact us. The fastest way to get your results is to register for My Chart.   IF you received an x-ray today, you will receive an invoice from [facility]. Please contact [facility] at [phone] with questions or concerns regarding your invoice.   IF you received labwork today, you will receive an invoice from [place]. Please contact [facility] at [phone] with questions or concerns regarding your invoice.   Our billing  staff will not be able to assist you with questions regarding bills from these companies.  You will be contacted with the lab results as soon as they are available. The fastest way to get your results is to activate your My Chart account. Instructions are located on the last page of this paperwork. If you have not heard from [place] regarding the results in 2 weeks, please contact this office.     Diabetes Mellitus and Nutrition, Adult When you have diabetes (diabetes mellitus), it is very important to have healthy eating habits because your blood sugar (glucose) levels are greatly affected by what you eat and drink. Eating healthy foods in the appropriate amounts, at about the same times every day, can help you:  Control your blood glucose.  Lower your risk of heart disease.  Improve your blood pressure.  Reach or maintain a healthy  weight. Every person with diabetes is different, and each person has different needs for a meal plan. Your health care provider may recommend that you work with a diet and nutrition specialist (dietitian) to make a meal plan that is best for you. Your meal plan may vary depending on factors such as:  The calories you need.  The medicines you take.  Your weight.  Your blood glucose, blood pressure, and cholesterol levels.  Your activity level.  Other health conditions you have, such as heart or kidney disease. How do carbohydrates affect me? Carbohydrates, also called carbs, affect your blood glucose level more than any other type of food. Eating carbs naturally raises the amount of glucose in your blood. Carb counting is a method for keeping track of how many carbs you eat. Counting carbs is important to keep your blood glucose at a healthy level, especially if you use insulin or take certain oral diabetes medicines. It is important to know how many carbs you can safely have in each meal. This is different for every person. Your dietitian can help you calculate how many carbs you should have at each meal and for each snack. Foods that contain carbs include:  Bread, cereal, rice, pasta, and crackers.  Potatoes and corn.  Peas, beans, and lentils.  Milk and yogurt.  Fruit and juice.  Desserts, such as cakes, cookies, ice cream, and candy. How does alcohol affect me? Alcohol can cause a sudden decrease in blood glucose (hypoglycemia), especially if you use insulin or take certain oral diabetes medicines. Hypoglycemia can be a life-threatening condition. Symptoms of hypoglycemia (sleepiness, dizziness, and confusion) are similar to symptoms of having too much alcohol. If your health care provider says that alcohol is safe for you, follow these guidelines:  Limit alcohol intake to no more than 1 drink per day for nonpregnant women and 2 drinks per day for men. One drink equals 12 oz of  beer, 5 oz of wine, or 1 oz of hard liquor.  Do not drink on an empty stomach.  Keep yourself hydrated with water, diet soda, or unsweetened iced tea.  Keep in mind that regular soda, juice, and other mixers may contain a lot of sugar and must be counted as carbs. What are tips for following this plan?  Reading food labels  Start by checking the serving size on the "Nutrition Facts" label of packaged foods and drinks. The amount of calories, carbs, fats, and other nutrients listed on the label is based on one serving of the item. Many items contain more than one serving per package.  Check the total grams (  g) of carbs in one serving. You can calculate the number of servings of carbs in one serving by dividing the total carbs by 15. For example, if a food has 30 g of total carbs, it would be equal to 2 servings of carbs.  Check the number of grams (g) of saturated and trans fats in one serving. Choose foods that have low or no amount of these fats.  Check the number of milligrams (mg) of salt (sodium) in one serving. Most people should limit total sodium intake to less than 2,300 mg per day.  Always check the nutrition information of foods labeled as "low-fat" or "nonfat". These foods may be higher in added sugar or refined carbs and should be avoided.  Talk to your dietitian to identify your daily goals for nutrients listed on the label. Shopping  Avoid buying canned, premade, or processed foods. These foods tend to be high in fat, sodium, and added sugar.  Shop around the outside edge of the grocery store. This includes fresh fruits and vegetables, bulk grains, fresh meats, and fresh dairy. Cooking  Use low-heat cooking methods, such as baking, instead of high-heat cooking methods like deep frying.  Cook using healthy oils, such as olive, canola, or sunflower oil.  Avoid cooking with butter, cream, or high-fat meats. Meal planning  Eat meals and snacks regularly, preferably at  the same times every day. Avoid going long periods of time without eating.  Eat foods high in fiber, such as fresh fruits, vegetables, beans, and whole grains. Talk to your dietitian about how many servings of carbs you can eat at each meal.  Eat 4-6 ounces (oz) of lean protein each day, such as lean meat, chicken, fish, eggs, or tofu. One oz of lean protein is equal to: ? 1 oz of meat, chicken, or fish. ? 1 egg. ?  cup of tofu.  Eat some foods each day that contain healthy fats, such as avocado, nuts, seeds, and fish. Lifestyle  Check your blood glucose regularly.  Exercise regularly as told by your health care provider. This may include: ? 150 minutes of moderate-intensity or vigorous-intensity exercise each week. This could be brisk walking, biking, or water aerobics. ? Stretching and doing strength exercises, such as yoga or weightlifting, at least 2 times a week.  Take medicines as told by your health care provider.  Do not use any products that contain nicotine or tobacco, such as cigarettes and e-cigarettes. If you need help quitting, ask your health care provider.  Work with a Social worker or diabetes educator to identify strategies to manage stress and any emotional and social challenges. Questions to ask a health care provider  Do I need to meet with a diabetes educator?  Do I need to meet with a dietitian?  What number can I call if I have questions?  When are the best times to check my blood glucose? Where to find more information:  American Diabetes Association: diabetes.org  Academy of Nutrition and Dietetics: www.eatright.[employer] of Diabetes and Digestive and Kidney Diseases (NIH): [employer] Summary  A healthy meal plan will help you control your blood glucose and maintain a healthy lifestyle.  Working with a diet and nutrition specialist (dietitian) can help you make a meal plan that is best for you.  Keep in mind that carbohydrates  (carbs) and alcohol have immediate effects on your blood glucose levels. It is important to count carbs and to use alcohol carefully. This information is not intended  to replace advice given to you by your health care provider. Make sure you discuss any questions you have with your health care provider. Document Revised: 04/30/2017 Document Reviewed: 06/22/2016 Elsevier Patient Education  2020 Elsevier Inc.      Agustina Caroli, MD [facility]

## 2019-08-22 NOTE — Assessment & Plan Note (Signed)
Well-controlled hypertension.  Continue present medications.  No changes Diabetes improving with hemoglobin A1c at 7.2, better than before. Continue Metformin and glipizide.  Increase dose of Trulicity to 1.5 mg weekly and continue monitoring blood glucose at home with target fasting morning numbers between 80 and 110.  Diet and nutrition discussed with patient. Follow-up in 3 months.

## 2019-08-29 ENCOUNTER — Other Ambulatory Visit: Payer: Self-pay | Admitting: *Deleted

## 2019-08-29 DIAGNOSIS — E119 Type 2 diabetes mellitus without complications: Secondary | ICD-10-CM

## 2019-08-29 DIAGNOSIS — E7849 Other hyperlipidemia: Secondary | ICD-10-CM

## 2019-08-29 MED ORDER — ATORVASTATIN CALCIUM 20 MG PO TABS: 20.0000 mg | ORAL_TABLET | Freq: Every day | ORAL | 3 refills | Status: DC

## 2019-09-26 ENCOUNTER — Other Ambulatory Visit: Payer: Self-pay | Admitting: Emergency Medicine

## 2019-09-26 DIAGNOSIS — I10 Essential (primary) hypertension: Secondary | ICD-10-CM

## 2019-09-26 NOTE — Telephone Encounter (Signed)
Requested Prescriptions  Pending Prescriptions Disp Refills  . amLODipine (NORVASC) 5 MG tablet [Pharmacy Med Name: AMLODIPINE BESYLATE 5MG  TABLETS] 90 tablet 3    Sig: TAKE 1 TABLET(5 MG) BY MOUTH DAILY     Cardiovascular:  Calcium Channel Blockers Passed - 09/26/2019  4:05 AM      Passed - Last BP in normal range    BP Readings from Last 1 Encounters:  08/22/19 123/77         Passed - Valid encounter within last 6 months    Recent Outpatient Visits          1 month ago Hypertension associated with diabetes [facility])   Primary Care at [place], [place], MD   4 months ago Hypertension associated with diabetes [facility])   Primary Care at [place], [place], MD   7 months ago Type 2 diabetes mellitus with hyperglycemia, without long-term current use of insulin [facility])   Primary Care at [facility], [place], MD   10 months ago Type 2 diabetes mellitus with hyperglycemia, without long-term current use of insulin [facility])   Primary Care at [place] [facility], [place], MD   1 year ago Type 2 diabetes mellitus with hyperglycemia, without long-term current use of insulin [facility])   Primary Care at [facility], Ines Bloomer, MD      Future Appointments            In 1 month Sagardia, Ines Bloomer, MD Primary Care at [place], [facility]

## 2019-10-31 ENCOUNTER — Telehealth: Payer: Self-pay | Admitting: Emergency Medicine

## 2019-10-31 ENCOUNTER — Other Ambulatory Visit: Payer: Self-pay

## 2019-10-31 DIAGNOSIS — E1165 Type 2 diabetes mellitus with hyperglycemia: Secondary | ICD-10-CM

## 2019-10-31 MED ORDER — TRULICITY 1.5 MG/0.5ML ~~LOC~~ SOAJ: 1.5000 mg | SUBCUTANEOUS | 6 refills | Status: DC

## 2019-10-31 NOTE — Telephone Encounter (Signed)
Patient is requesting a refill of the following medications: Requested Prescriptions    No prescriptions requested or ordered in this encounter  Trulicity [id number]  Date of patient request: 10/31/2019 Last office visit: 08/22/2019 Date of last refill: 08/22/2019 Last refill amount: 4 pens Follow up time period per chart: 3 months

## 2019-10-31 NOTE — Telephone Encounter (Signed)
Did not note med on Active med list. Last prescribed:08/22/19 and end date 09/21/19.    OV 08/22/19: ASSESSMENT & PLAN: Hypertension associated with diabetes ([place]) Well-controlled hypertension.  Continue present medications.  No changes Diabetes improving with hemoglobin A1c at 7.2, better than before. Continue Metformin and glipizide.  Increase dose of Trulicity to 1.5 mg weekly and continue monitoring blood glucose at home with target fasting morning numbers between 80 and 110.  Diet and nutrition discussed with patient. Follow-up in 3 months.   Pt c/o that the 1.5 is causing her diarrhea. Please advise pt.

## 2019-10-31 NOTE — Telephone Encounter (Signed)
Okay to refill.  Thanks.

## 2019-10-31 NOTE — Telephone Encounter (Signed)
Medication Refill - Medication:  Dulaglutide (TRULICITY) A999333 [id number] SOPN  Has the patient contacted their pharmacy? Yes advised to call office. Patient stated the 1.5 is too much for her and causing her to have diarrhea and other symptoms. Please advise.  Preferred Pharmacy (with phone number or street name):  [facility] [id number] - [place], [place] Phone:  [phone]  Fax:  [phone]       Agent: Please be advised that RX refills may take up to 3 business days. We ask that you follow-up with your pharmacy.

## 2019-11-21 ENCOUNTER — Ambulatory Visit: Payer: Medicare Other | Admitting: Emergency Medicine

## 2019-11-21 ENCOUNTER — Encounter: Payer: Self-pay | Admitting: Emergency Medicine

## 2019-11-21 ENCOUNTER — Other Ambulatory Visit: Payer: Self-pay

## 2019-11-21 VITALS — BP 131/74 | HR 75 | Temp 97.2°F | Resp 16 | Ht 65.0 in | Wt 188.0 lb

## 2019-11-21 DIAGNOSIS — E1165 Type 2 diabetes mellitus with hyperglycemia: Secondary | ICD-10-CM | POA: Diagnosis not present

## 2019-11-21 DIAGNOSIS — E1159 Type 2 diabetes mellitus with other circulatory complications: Secondary | ICD-10-CM

## 2019-11-21 DIAGNOSIS — I119 Hypertensive heart disease without heart failure: Secondary | ICD-10-CM | POA: Diagnosis not present

## 2019-11-21 DIAGNOSIS — E1169 Type 2 diabetes mellitus with other specified complication: Secondary | ICD-10-CM

## 2019-11-21 DIAGNOSIS — E785 Hyperlipidemia, unspecified: Secondary | ICD-10-CM

## 2019-11-21 DIAGNOSIS — I10 Essential (primary) hypertension: Secondary | ICD-10-CM

## 2019-11-21 LAB — COMPREHENSIVE METABOLIC PANEL
ALT: 15 IU/L (ref 0–32)
AST: 15 IU/L (ref 0–40)
Albumin/Globulin Ratio: 2.3 — ABNORMAL HIGH (ref 1.2–2.2)
Albumin: 4.3 g/dL (ref 3.7–4.7)
Alkaline Phosphatase: 87 IU/L (ref 48–121)
BUN/Creatinine Ratio: 21 (ref 12–28)
BUN: 19 mg/dL (ref 8–27)
Bilirubin Total: 0.5 mg/dL (ref 0.0–1.2)
CO2: 25 mmol/L (ref 20–29)
Calcium: 9.7 mg/dL (ref 8.7–10.3)
Chloride: 104 mmol/L (ref 96–106)
Creatinine, Ser: 0.9 mg/dL (ref 0.57–1.00)
GFR calc Af Amer: 70 mL/min/{1.73_m2} (ref >59)
GFR calc non Af Amer: 61 mL/min/{1.73_m2} (ref >59)
Globulin, Total: 1.9 g/dL (ref 1.5–4.5)
Glucose: 155 mg/dL — ABNORMAL HIGH (ref 65–99)
Potassium: 3.6 mmol/L (ref 3.5–5.2)
Sodium: 142 mmol/L (ref 134–144)
Total Protein: 6.2 g/dL (ref 6.0–8.5)

## 2019-11-21 LAB — POCT GLYCOSYLATED HEMOGLOBIN (HGB A1C): Hemoglobin A1C: 6.9 % — AB (ref 4.0–5.6)

## 2019-11-21 LAB — GLUCOSE, POCT (MANUAL RESULT ENTRY): POC Glucose: 157 mg/dl — AB (ref 70–99)

## 2019-11-21 MED ORDER — TRULICITY 0.75 MG/0.5ML ~~LOC~~ SOAJ: 0.7500 mg | SUBCUTANEOUS | 3 refills | Status: AC

## 2019-11-21 NOTE — Patient Instructions (Addendum)
   If you have lab work done today you will be contacted with your lab results within the next 2 weeks.  If you have not heard from us then please contact us. The fastest way to get your results is to register for My Chart.   IF you received an x-ray today, you will receive an invoice from [facility]. Please contact  Radiology at [phone] with questions or concerns regarding your invoice.   IF you received labwork today, you will receive an invoice from [facility]. Please contact [facility] at [phone] with questions or concerns regarding your invoice.   Our billing staff will not be able to assist you with questions regarding bills from these companies.  You will be contacted with the lab results as soon as they are available. The fastest way to get your results is to activate your My Chart account. Instructions are located on the last page of this paperwork. If you have not heard from us regarding the results in 2 weeks, please contact this office.     Diabetes Mellitus and Nutrition, Adult When you have diabetes (diabetes mellitus), it is very important to have healthy eating habits because your blood sugar (glucose) levels are greatly affected by what you eat and drink. Eating healthy foods in the appropriate amounts, at about the same times every day, can help you:  Control your blood glucose.  Lower your risk of heart disease.  Improve your blood pressure.  Reach or maintain a healthy weight. Every person with diabetes is different, and each person has different needs for a meal plan. Your health care provider may recommend that you work with a diet and nutrition specialist (dietitian) to make a meal plan that is best for you. Your meal plan may vary depending on factors such as:  The calories you need.  The medicines you take.  Your weight.  Your blood glucose, blood pressure, and cholesterol levels.  Your activity level.  Other health conditions  you have, such as heart or kidney disease. How do carbohydrates affect me? Carbohydrates, also called carbs, affect your blood glucose level more than any other type of food. Eating carbs naturally raises the amount of glucose in your blood. Carb counting is a method for keeping track of how many carbs you eat. Counting carbs is important to keep your blood glucose at a healthy level, especially if you use insulin or take certain oral diabetes medicines. It is important to know how many carbs you can safely have in each meal. This is different for every person. Your dietitian can help you calculate how many carbs you should have at each meal and for each snack. Foods that contain carbs include:  Bread, cereal, rice, pasta, and crackers.  Potatoes and corn.  Peas, beans, and lentils.  Milk and yogurt.  Fruit and juice.  Desserts, such as cakes, cookies, ice cream, and candy. How does alcohol affect me? Alcohol can cause a sudden decrease in blood glucose (hypoglycemia), especially if you use insulin or take certain oral diabetes medicines. Hypoglycemia can be a life-threatening condition. Symptoms of hypoglycemia (sleepiness, dizziness, and confusion) are similar to symptoms of having too much alcohol. If your health care provider says that alcohol is safe for you, follow these guidelines:  Limit alcohol intake to no more than 1 drink per day for nonpregnant women and 2 drinks per day for men. One drink equals 12 oz of beer, 5 oz of wine, or 1 oz of hard liquor.    Do not drink on an empty stomach.  Keep yourself hydrated with water, diet soda, or unsweetened iced tea.  Keep in mind that regular soda, juice, and other mixers may contain a lot of sugar and must be counted as carbs. What are tips for following this plan?  Reading food labels  Start by checking the serving size on the "Nutrition Facts" label of packaged foods and drinks. The amount of calories, carbs, fats, and other  nutrients listed on the label is based on one serving of the item. Many items contain more than one serving per package.  Check the total grams (g) of carbs in one serving. You can calculate the number of servings of carbs in one serving by dividing the total carbs by 15. For example, if a food has 30 g of total carbs, it would be equal to 2 servings of carbs.  Check the number of grams (g) of saturated and trans fats in one serving. Choose foods that have low or no amount of these fats.  Check the number of milligrams (mg) of salt (sodium) in one serving. Most people should limit total sodium intake to less than 2,300 mg per day.  Always check the nutrition information of foods labeled as "low-fat" or "nonfat". These foods may be higher in added sugar or refined carbs and should be avoided.  Talk to your dietitian to identify your daily goals for nutrients listed on the label. Shopping  Avoid buying canned, premade, or processed foods. These foods tend to be high in fat, sodium, and added sugar.  Shop around the outside edge of the grocery store. This includes fresh fruits and vegetables, bulk grains, fresh meats, and fresh dairy. Cooking  Use low-heat cooking methods, such as baking, instead of high-heat cooking methods like deep frying.  Cook using healthy oils, such as olive, canola, or sunflower oil.  Avoid cooking with butter, cream, or high-fat meats. Meal planning  Eat meals and snacks regularly, preferably at the same times every day. Avoid going long periods of time without eating.  Eat foods high in fiber, such as fresh fruits, vegetables, beans, and whole grains. Talk to your dietitian about how many servings of carbs you can eat at each meal.  Eat 4-6 ounces (oz) of lean protein each day, such as lean meat, chicken, fish, eggs, or tofu. One oz of lean protein is equal to: ? 1 oz of meat, chicken, or fish. ? 1 egg. ?  cup of tofu.  Eat some foods each day that contain  healthy fats, such as avocado, nuts, seeds, and fish. Lifestyle  Check your blood glucose regularly.  Exercise regularly as told by your health care provider. This may include: ? 150 minutes of moderate-intensity or vigorous-intensity exercise each week. This could be brisk walking, biking, or water aerobics. ? Stretching and doing strength exercises, such as yoga or weightlifting, at least 2 times a week.  Take medicines as told by your health care provider.  Do not use any products that contain nicotine or tobacco, such as cigarettes and e-cigarettes. If you need help quitting, ask your health care provider.  Work with a counselor or diabetes educator to identify strategies to manage stress and any emotional and social challenges. Questions to ask a health care provider  Do I need to meet with a diabetes educator?  Do I need to meet with a dietitian?  What number can I call if I have questions?  When are the best times to   times to check my blood glucose? Where to find more information:  American Diabetes Association: diabetes.org  Academy of Nutrition and Dietetics: www.eatright.org  National Institute of Diabetes and Digestive and Kidney Diseases (NIH): www.niddk.nih.gov Summary  A healthy meal plan will help you control your blood glucose and maintain a healthy lifestyle.  Working with a diet and nutrition specialist (dietitian) can help you make a meal plan that is best for you.  Keep in mind that carbohydrates (carbs) and alcohol have immediate effects on your blood glucose levels. It is important to count carbs and to use alcohol carefully. This information is not intended to replace advice given to you by your health care provider. Make sure you discuss any questions you have with your health care provider. Document Revised: 04/30/2017 Document Reviewed: 06/22/2016 [employer] Patient Education  2020 [employer]  

## 2019-11-21 NOTE — Progress Notes (Signed)
Angela Chambers 79 y.o.   Chief Complaint  Patient presents with   Diabetes    FOLLOW UP 3 MONTH and problem with Trulicity causing belly pain and diarrhea   Medication Refill    Trulicity - [facility]    ASSESSMENT & PLAN: Office visit 08/22/2019: Hypertension associated with diabetes ([place]) Well-controlled hypertension.  Continue present medications.  No changes Diabetes improving with hemoglobin A1c at 7.2, better than before. Continue Metformin and glipizide.  Increase dose of Trulicity to 1.5 mg weekly and continue monitoring blood glucose at home with target fasting morning numbers between 80 and 110.  Diet and nutrition discussed with patient. Follow-up in 3 months. HISTORY OF PRESENT ILLNESS: This is a 79 y.o. female history of diabetes and hypertension here for follow-up.  Doing well.  No complaints. Increased dose of Trulicity, 1.5 mg, gave her GI side effects.  Has been off Trulicity for 3 weeks.  Requesting to go back to 0.75 mg dose. Has been eating better and losing weight, 13 pounds total. No complaints or any other medical concerns today.   Wt Readings from Last 3 Encounters:  11/21/19 188 lb (85.3 kg)  08/22/19 192 lb (87.1 kg)  05/23/19 194 lb (88 kg)    HPI   Prior to Admission medications   Medication Sig Start Date End Date Taking? Authorizing Provider  amLODipine (NORVASC) 5 MG tablet TAKE 1 TABLET(5 MG) BY MOUTH DAILY 09/26/19  Yes Emerald Gehres, Ines Bloomer, MD  aspirin 81 MG tablet Take 81 mg by mouth daily.   Yes [provider]  atorvastatin (LIPITOR) 20 MG tablet Take 1 tablet (20 mg total) by mouth daily. 08/29/19  Yes Brynnly Bonet, Ines Bloomer, MD  brimonidine (ALPHAGAN P) 0.1 % SOLN Place 1 drop into both eyes 2 (two) times daily.   Yes [provider]  Co-Enzyme Q-10 30 MG CAPS Take 30 mg by mouth daily.   Yes [provider]  dorzolamide-timolol (COSOPT) 22.3-6.8 MG/ML ophthalmic solution INSTILL 1 DROP INTO BOTH EYES TWICE A  DAY 09/23/17  Yes [provider]  Dulaglutide (TRULICITY) 1.5 UD/1.4HF SOPN Inject 1.5 mg into the skin once a week. 10/31/19 11/30/19 Yes Akon Reinoso, Ines Bloomer, MD  latanoprost (XALATAN) 0.005 % ophthalmic solution  09/21/18  Yes [provider]  losartan (COZAAR) 100 MG tablet TAKE 1 TABLET(100 MG) BY MOUTH DAILY 06/25/19  Yes Nina Mondor, Ines Bloomer, MD  metoprolol succinate (TOPROL-XL) 50 MG 24 hr tablet Take 1 tablet (50 mg total) by mouth daily. Take with or immediately following a meal. 05/08/19  Yes Leonie Man, MD  Multiple Vitamin (MULTIVITAMIN) tablet Take 1 tablet by mouth daily.   Yes [provider]  PRESCRIPTION MEDICATION Timolol 0.05 % taking 1 drop to each eye every am   Yes [provider]  Travoprost, BAK Free, (TRAVATAN) 0.004 % SOLN ophthalmic solution Place 1 drop into both eyes at bedtime.   Yes [provider]  Blood Glucose Monitoring Suppl (BLOOD GLUCOSE MONITOR SYSTEM) w/Device KIT 1 Units by Does not apply route 2 (two) times daily. 02/07/18   Tenna Delaine D, PA-C  glipiZIDE (GLUCOTROL) 5 MG tablet Take 1 tablet (5 mg total) by mouth 2 (two) times daily before a meal. 05/23/19 08/21/19  Rahi Chandonnet, Ines Bloomer, MD  hydrochlorothiazide (HYDRODIURIL) 25 MG tablet Take 1 tablet (25 mg total) by mouth daily. 08/22/19 11/20/19  Horald Pollen, MD  metFORMIN (GLUCOPHAGE) 500 MG tablet Take 1 tablet (500 mg total) by mouth 2 (two)  times daily with a meal. 08/22/19 11/20/19  Horald Pollen, MD    Allergies  Allergen Reactions   Lisinopril Cough   Metformin And Related     Diarrhea on 1073m BID, tolerates 500 mg BID    Patient Active Problem List   Diagnosis Date Noted   Hypertension associated with diabetes ([place] 08/16/2018   Abnormal stress echo EKG portion with normal echo -> imaging confirmed the correct with normal coronaries 06/21/2015   OA (osteoarthritis) of knee 09/25/2013   Obesity (BMI 30.0-34.9)     Hypertensive heart disease without CHF 11/30/2011   Hyperlipidemia 08/25/2011   Other and combined forms of senile cataract 07/04/2011   Primary open angle glaucoma of both eyes, severe stage 07/04/2011   Dyslipidemia associated with type 2 diabetes mellitus ([place] 02/22/2009   PERSONAL HISTORY OF COLONIC POLYPS 02/22/2009    Past Medical History:  Diagnosis Date   Arthritis    Cataract    Diabetes mellitus    Fibroids    Glaucoma    Hyperlipidemia    Hypertension 07 /2013    WITH EF GREATER THAN 55 % WITH MILD MR BUT NO PROLASPE ,ESSENTIALLY NORMAL DONE TO EVALUATE PALPITATIONS .   Obesity (BMI 30.0-34.9)    Osteoporosis    Palpitations    CONTROLLED ON BETA BLOCKER    Past Surgical History:  Procedure Laterality Date   ABDOMINAL HYSTERECTOMY  10/18/ 1982   TAH-BSO for fibroids and adenomatous hyperplasia   CARDIAC CATHETERIZATION  08/2009   NONOBSTRUCTIVE CAD WITH 20% IN THE LAD AND 20 % IN THE RCA NORMAL EF 65%   CARDIAC CATHETERIZATION N/A 07/05/2015   Procedure: Left Heart Cath and Coronary Angiography;  Surgeon: DLeonie Man MD;  [facility];  Service: Cardiovascular: Angiographically minimal CAD. EF 60-65%   CPET-MET Test (Cardiopulmonary Exercise Test)  03/06/2013   Adequate effort at 1.11. Peak VO2 12.3 is 86% in the normal range. Heart rate was just outside the normal range target being 127 beats a minute but this was 86% of projected. She did have an ischemic pattern after the anterograde threshold with increased heart rate and blunting of stroke volume during the last 3 minutes of exercise, but was asymptomatic.   JOINT REPLACEMENT  09/05/2009   L knee   KNEE SURGERY  1984   per pt screws in R knee   NM MYOVIEW LTD  06/2015   FALSE + GXT - Images Accurate:  TM - 6 min, 7 METs (93% MPHR) --> chest discomfort, notable ST depressions => Hight Risk Duke TM Score, but NO scintigraphic evidence of ischemia or infarction. 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  Normal   TUBAL LIGATION      Social History   Socioeconomic History   Marital status: Married    Spouse name: Not on file   Number of children: 3   Years of education: Not on file   Highest education level: Some college, no degree  Occupational History   Occupation: retired  Tobacco Use   Smoking status: Former Smoker    Packs/day: 0.50    Years: 20.00    Pack years: 10.00    Types: Cigarettes    Quit date: 07/13/1981  Years since quitting: 38.3   Smokeless tobacco: Never Used  Vaping Use   Vaping Use: Never used  Substance and Sexual Activity   Alcohol use: No   Drug use: No   Sexual activity: Yes  Other Topics Concern   Not on file  Social History Narrative   SHE IS MARRIED ,MOTHER OF 4, GRANDMOTHER OF 6. SHE DOES EXERCISE BUT SOMEWHAT LIMITED BECAUSE OF HER KNEE (S/P SURGERY 2015).   SHE QUIT SMOKING IN 1980 AND SHE DOES NOT DRINK   Social Determinants of Health   Financial Resource Strain:    Difficulty of Paying Living Expenses:   Food Insecurity:    Worried About Charity fundraiser in the Last Year:    Arboriculturist in the Last Year:   Transportation Needs:    Film/video editor (Medical):    Lack of Transportation (Non-Medical):   Physical Activity:    Days of Exercise per Week:    Minutes of Exercise per Session:   Stress:    Feeling of Stress :   Social Connections:    Frequency of Communication with Friends and Family:    Frequency of Social Gatherings with Friends and Family:    Attends Religious Services:    Active Member of Clubs or Organizations:    Attends Music therapist:    Marital Status:   Intimate Partner Violence:    Fear of  Current or Ex-Partner:    Emotionally Abused:    Physically Abused:    Sexually Abused:     Family History  Problem Relation Age of Onset   Diabetes Mother    Heart disease Mother    Hyperlipidemia Mother    Diabetes Sister    Hyperlipidemia Sister    Hypertension Sister    Diabetes Brother    Hyperlipidemia Brother    Prostate cancer Son        prostate   Hyperlipidemia Sister      Review of Systems  Constitutional: Negative.  Negative for chills and fever.  HENT: Negative.  Negative for congestion and sore throat.   Respiratory: Negative.  Negative for cough and shortness of breath.   Cardiovascular: Negative.  Negative for chest pain and palpitations.  Gastrointestinal: Negative.  Negative for abdominal pain, diarrhea, nausea and vomiting.  Genitourinary: Negative.  Negative for dysuria and hematuria.  Musculoskeletal: Negative.  Negative for back pain, myalgias and neck pain.  Skin: Negative.  Negative for rash.  Neurological: Negative.  Negative for dizziness and headaches.  All other systems reviewed and are negative.  Today's Vitals   11/21/19 1136  BP: 131/74  Pulse: 75  Resp: 16  Temp: (!) 97.2 F (36.2 C)  TempSrc: Temporal  SpO2: 97%  Weight: 188 lb (85.3 kg)  Height: 5' 5"  (1.651 m)   Body mass index is 31.28 kg/m.   Physical Exam Vitals reviewed.  Constitutional:      Appearance: Normal appearance.  HENT:     Head: Normocephalic.  Eyes:     Extraocular Movements: Extraocular movements intact.     Pupils: Pupils are equal, round, and reactive to light.  Cardiovascular:     Rate and Rhythm: Normal rate and regular rhythm.     Pulses: Normal pulses.     Heart sounds: Normal heart sounds.  Pulmonary:     Effort: Pulmonary effort is normal.     Breath sounds: Normal breath sounds.  Abdominal:     Palpations: Abdomen is soft.  Tenderness: There is no abdominal tenderness.  Musculoskeletal:        General: Normal range of  motion.     Cervical back: Normal range of motion and neck supple.  Skin:    General: Skin is warm and dry.     Capillary Refill: Capillary refill takes less than 2 seconds.  Neurological:     General: No focal deficit present.     Mental Status: She is alert and oriented to person, place, and time.  Psychiatric:        Mood and Affect: Mood normal.        Behavior: Behavior normal.     Results for orders placed or performed in visit on 11/21/19 (from the past 24 hour(s))  POCT glucose (manual entry)     Status: Abnormal   Collection Time: 11/21/19 12:12 PM  Result Value Ref Range   POC Glucose 157 (A) 70 - 99 mg/dl  POCT glycosylated hemoglobin (Hb A1C)     Status: Abnormal   Collection Time: 11/21/19 12:15 PM  Result Value Ref Range   Hemoglobin A1C 6.9 (A) 4.0 - 5.6 %   HbA1c POC (<> result, manual entry)     HbA1c, POC (prediabetic range)     HbA1c, POC (controlled diabetic range)     A total of 30 minutes was spent with the patient, greater than 50% of which was in counseling/coordination of care regarding diabetes and hypertension, cardiovascular risks associated with these conditions, review of all medications, review of most recent blood work results including today's hemoglobin A1c, review of most recent office visit notes, diet and nutrition, prognosis and need for follow-up.  ASSESSMENT & PLAN: Hypertension associated with diabetes ([place]) Well-controlled hypertension.  Continue present medications.  No changes. Improved diabetes with hemoglobin A1c at 6.9 better than before.  Continue present medications.  Restart Trulicity 5.42 mg weekly.  Diet and nutrition discussed. Follow-up in 6 months as requested by patient.  Tyese was seen today for diabetes and medication refill.  Diagnoses and all orders for this visit:  Hypertension associated with diabetes ([place]) -     Comprehensive metabolic panel  Type 2 diabetes mellitus with hyperglycemia, without long-term  current use of insulin ([facility]) -     POCT glucose (manual entry) -     POCT glycosylated hemoglobin (Hb A1C) -     Dulaglutide (TRULICITY) 7.06 CB/7.6EG SOPN; Inject 0.5 mLs (0.75 mg total) into the skin once a week.  Hypertensive heart disease without CHF  Dyslipidemia associated with type 2 diabetes mellitus [facility])    Patient Instructions       If you have lab work done today you will be contacted with your lab results within the next 2 weeks.  If you have not heard from [place] then please contact us. The fastest way to get your results is to register for My Chart.   IF you received an x-ray today, you will receive an invoice from [facility]. Please contact [facility] at [phone] with questions or concerns regarding your invoice.   IF you received labwork today, you will receive an invoice from [facility]. Please contact [facility] at [phone] with questions or concerns regarding your invoice.   Our billing staff will not be able to assist you with questions regarding bills from these companies.  You will be contacted with the lab results as soon as they are available. The fastest way to get your results is to activate your My Chart account. Instructions are located  on the last page of this paperwork. If you have not heard from [place] regarding the results in 2 weeks, please contact this office.     Diabetes Mellitus and Nutrition, Adult When you have diabetes (diabetes mellitus), it is very important to have healthy eating habits because your blood sugar (glucose) levels are greatly affected by what you eat and drink. Eating healthy foods in the appropriate amounts, at about the same times every day, can help you:  Control your blood glucose.  Lower your risk of heart disease.  Improve your blood pressure.  Reach or maintain a healthy weight. Every person with diabetes is different, and each person has different needs for a meal plan. Your health care provider  may recommend that you work with a diet and nutrition specialist (dietitian) to make a meal plan that is best for you. Your meal plan may vary depending on factors such as:  The calories you need.  The medicines you take.  Your weight.  Your blood glucose, blood pressure, and cholesterol levels.  Your activity level.  Other health conditions you have, such as heart or kidney disease. How do carbohydrates affect me? Carbohydrates, also called carbs, affect your blood glucose level more than any other type of food. Eating carbs naturally raises the amount of glucose in your blood. Carb counting is a method for keeping track of how many carbs you eat. Counting carbs is important to keep your blood glucose at a healthy level, especially if you use insulin or take certain oral diabetes medicines. It is important to know how many carbs you can safely have in each meal. This is different for every person. Your dietitian can help you calculate how many carbs you should have at each meal and for each snack. Foods that contain carbs include:  Bread, cereal, rice, pasta, and crackers.  Potatoes and corn.  Peas, beans, and lentils.  Milk and yogurt.  Fruit and juice.  Desserts, such as cakes, cookies, ice cream, and candy. How does alcohol affect me? Alcohol can cause a sudden decrease in blood glucose (hypoglycemia), especially if you use insulin or take certain oral diabetes medicines. Hypoglycemia can be a life-threatening condition. Symptoms of hypoglycemia (sleepiness, dizziness, and confusion) are similar to symptoms of having too much alcohol. If your health care provider says that alcohol is safe for you, follow these guidelines:  Limit alcohol intake to no more than 1 drink per day for nonpregnant women and 2 drinks per day for men. One drink equals 12 oz of beer, 5 oz of wine, or 1 oz of hard liquor.  Do not drink on an empty stomach.  Keep yourself hydrated with water, diet soda,  or unsweetened iced tea.  Keep in mind that regular soda, juice, and other mixers may contain a lot of sugar and must be counted as carbs. What are tips for following this plan?  Reading food labels  Start by checking the serving size on the "Nutrition Facts" label of packaged foods and drinks. The amount of calories, carbs, fats, and other nutrients listed on the label is based on one serving of the item. Many items contain more than one serving per package.  Check the total grams (g) of carbs in one serving. You can calculate the number of servings of carbs in one serving by dividing the total carbs by 15. For example, if a food has 30 g of total carbs, it would be equal to 2 servings of carbs.  Check  the number of grams (g) of saturated and trans fats in one serving. Choose foods that have low or no amount of these fats.  Check the number of milligrams (mg) of salt (sodium) in one serving. Most people should limit total sodium intake to less than 2,300 mg per day.  Always check the nutrition information of foods labeled as "low-fat" or "nonfat". These foods may be higher in added sugar or refined carbs and should be avoided.  Talk to your dietitian to identify your daily goals for nutrients listed on the label. Shopping  Avoid buying canned, premade, or processed foods. These foods tend to be high in fat, sodium, and added sugar.  Shop around the outside edge of the grocery store. This includes fresh fruits and vegetables, bulk grains, fresh meats, and fresh dairy. Cooking  Use low-heat cooking methods, such as baking, instead of high-heat cooking methods like deep frying.  Cook using healthy oils, such as olive, canola, or sunflower oil.  Avoid cooking with butter, cream, or high-fat meats. Meal planning  Eat meals and snacks regularly, preferably at the same times every day. Avoid going long periods of time without eating.  Eat foods high in fiber, such as fresh fruits,  vegetables, beans, and whole grains. Talk to your dietitian about how many servings of carbs you can eat at each meal.  Eat 4-6 ounces (oz) of lean protein each day, such as lean meat, chicken, fish, eggs, or tofu. One oz of lean protein is equal to: ? 1 oz of meat, chicken, or fish. ? 1 egg. ?  cup of tofu.  Eat some foods each day that contain healthy fats, such as avocado, nuts, seeds, and fish. Lifestyle  Check your blood glucose regularly.  Exercise regularly as told by your health care provider. This may include: ? 150 minutes of moderate-intensity or vigorous-intensity exercise each week. This could be brisk walking, biking, or water aerobics. ? Stretching and doing strength exercises, such as yoga or weightlifting, at least 2 times a week.  Take medicines as told by your health care provider.  Do not use any products that contain nicotine or tobacco, such as cigarettes and e-cigarettes. If you need help quitting, ask your health care provider.  Work with a Social worker or diabetes educator to identify strategies to manage stress and any emotional and social challenges. Questions to ask a health care provider  Do I need to meet with a diabetes educator?  Do I need to meet with a dietitian?  What number can I call if I have questions?  When are the best times to check my blood glucose? Where to find more information:  American Diabetes Association: diabetes.org  Academy of Nutrition and Dietetics: www.eatright.[employer] of Diabetes and Digestive and Kidney Diseases (NIH): [employer] Summary  A healthy meal plan will help you control your blood glucose and maintain a healthy lifestyle.  Working with a diet and nutrition specialist (dietitian) can help you make a meal plan that is best for you.  Keep in mind that carbohydrates (carbs) and alcohol have immediate effects on your blood glucose levels. It is important to count carbs and to use alcohol  carefully. This information is not intended to replace advice given to you by your health care provider. Make sure you discuss any questions you have with your health care provider. Document Revised: 04/30/2017 Document Reviewed: 06/22/2016 Elsevier Patient Education  2020 Elsevier Inc.      Agustina Caroli, MD [facility]

## 2019-11-21 NOTE — Assessment & Plan Note (Signed)
Well-controlled hypertension.  Continue present medications.  No changes. Improved diabetes with hemoglobin A1c at 6.9 better than before.  Continue present medications.  Restart Trulicity 8.54 mg weekly.  Diet and nutrition discussed. Follow-up in 6 months as requested by patient.

## 2019-12-11 ENCOUNTER — Other Ambulatory Visit: Payer: Self-pay | Admitting: Emergency Medicine

## 2019-12-11 DIAGNOSIS — I10 Essential (primary) hypertension: Secondary | ICD-10-CM

## 2019-12-11 NOTE — Telephone Encounter (Signed)
Requested medication (s) are due for refill today:yes  Requested medication (s) are on the active medication list: yes  Last refill:  06/25/19  #90  1 refill  Future visit scheduled: 05/22/20  Notes to clinic: Patient needs appointment q 6 months for this medication.    Requested Prescriptions  Pending Prescriptions Disp Refills   losartan (COZAAR) 100 MG tablet [Pharmacy Med Name: LOSARTAN 100MG  TABLETS] 90 tablet 1    Sig: TAKE 1 TABLET(100 MG) BY MOUTH DAILY      Cardiovascular:  Angiotensin Receptor Blockers Passed - 12/11/2019  9:57 AM      Passed - Cr in normal range and within 180 days    Creat  Date Value Ref Range Status  03/17/2016 0.79 0.60 - 0.93 mg/dL Final    Comment:      For patients > or = 79 years of age: The upper reference limit for Creatinine is approximately 13% higher for people identified as African-American.      Creatinine, Ser  Date Value Ref Range Status  11/21/2019 0.90 0.57 - 1.00 mg/dL Final          Passed - K in normal range and within 180 days    Potassium  Date Value Ref Range Status  11/21/2019 3.6 3.5 - 5.2 mmol/L Final          Passed - Patient is not pregnant      Passed - Last BP in normal range    BP Readings from Last 1 Encounters:  11/21/19 131/74          Passed - Valid encounter within last 6 months    Recent Outpatient Visits           2 weeks ago Hypertension associated with diabetes [facility])   Primary Care at [place], Ines Bloomer, MD   3 months ago Hypertension associated with diabetes [facility])   Primary Care at [place], [place], MD   6 months ago Hypertension associated with diabetes [facility])   Primary Care at [place], [place], MD   9 months ago Type 2 diabetes mellitus with hyperglycemia, without long-term current use of insulin [facility])   Primary Care at [facility], [address], MD   1 year ago Type 2 diabetes mellitus with hyperglycemia, without long-term current use of insulin  [facility])   Primary Care at [facility], Ines Bloomer, MD       Future Appointments             In 5 months [place], Ines Bloomer, MD Primary Care at [place], [facility]

## 2019-12-27 ENCOUNTER — Other Ambulatory Visit: Payer: Self-pay | Admitting: Emergency Medicine

## 2019-12-27 DIAGNOSIS — E1159 Type 2 diabetes mellitus with other circulatory complications: Secondary | ICD-10-CM

## 2020-01-22 ENCOUNTER — Other Ambulatory Visit: Payer: Self-pay | Admitting: Cardiology

## 2020-01-31 ENCOUNTER — Other Ambulatory Visit: Payer: Self-pay | Admitting: Emergency Medicine

## 2020-01-31 DIAGNOSIS — I10 Essential (primary) hypertension: Secondary | ICD-10-CM

## 2020-04-15 ENCOUNTER — Other Ambulatory Visit: Payer: Self-pay | Admitting: Emergency Medicine

## 2020-04-15 DIAGNOSIS — I152 Hypertension secondary to endocrine disorders: Secondary | ICD-10-CM

## 2020-05-06 ENCOUNTER — Other Ambulatory Visit: Payer: Self-pay | Admitting: Emergency Medicine

## 2020-05-06 DIAGNOSIS — I10 Essential (primary) hypertension: Secondary | ICD-10-CM

## 2020-05-06 NOTE — Telephone Encounter (Signed)
Future visit in 2 weeks  

## 2020-05-09 ENCOUNTER — Other Ambulatory Visit: Payer: Self-pay

## 2020-05-09 ENCOUNTER — Emergency Department (HOSPITAL_COMMUNITY): Payer: Medicare Other

## 2020-05-09 ENCOUNTER — Ambulatory Visit: Payer: Medicare Other | Admitting: Cardiology

## 2020-05-09 ENCOUNTER — Inpatient Hospital Stay (HOSPITAL_COMMUNITY)
Admission: EM | Admit: 2020-05-09 | Discharge: 2020-05-14 | DRG: 481 | Disposition: A | Discharge status: Home Health | Payer: Medicare Other | Attending: Student | Admitting: Student

## 2020-05-09 ENCOUNTER — Encounter (HOSPITAL_COMMUNITY): Payer: Self-pay

## 2020-05-09 DIAGNOSIS — S72491A Other fracture of lower end of right femur, initial encounter for closed fracture: Secondary | ICD-10-CM

## 2020-05-09 DIAGNOSIS — W19XXXA Unspecified fall, initial encounter: Secondary | ICD-10-CM

## 2020-05-09 DIAGNOSIS — D62 Acute posthemorrhagic anemia: Secondary | ICD-10-CM | POA: Diagnosis not present

## 2020-05-09 DIAGNOSIS — D509 Iron deficiency anemia, unspecified: Secondary | ICD-10-CM | POA: Diagnosis present

## 2020-05-09 DIAGNOSIS — M80851A Other osteoporosis with current pathological fracture, right femur, initial encounter for fracture: Secondary | ICD-10-CM | POA: Diagnosis present

## 2020-05-09 DIAGNOSIS — T148XXA Other injury of unspecified body region, initial encounter: Secondary | ICD-10-CM

## 2020-05-09 DIAGNOSIS — S7290XA Unspecified fracture of unspecified femur, initial encounter for closed fracture: Secondary | ICD-10-CM | POA: Diagnosis present

## 2020-05-09 DIAGNOSIS — R330 Drug induced retention of urine: Secondary | ICD-10-CM | POA: Diagnosis not present

## 2020-05-09 DIAGNOSIS — R339 Retention of urine, unspecified: Secondary | ICD-10-CM

## 2020-05-09 DIAGNOSIS — Z833 Family history of diabetes mellitus: Secondary | ICD-10-CM

## 2020-05-09 DIAGNOSIS — E876 Hypokalemia: Secondary | ICD-10-CM | POA: Diagnosis present

## 2020-05-09 DIAGNOSIS — R350 Frequency of micturition: Secondary | ICD-10-CM | POA: Diagnosis not present

## 2020-05-09 DIAGNOSIS — Y9301 Activity, walking, marching and hiking: Secondary | ICD-10-CM | POA: Diagnosis present

## 2020-05-09 DIAGNOSIS — Z888 Allergy status to other drugs, medicaments and biological substances status: Secondary | ICD-10-CM

## 2020-05-09 DIAGNOSIS — Z20822 Contact with and (suspected) exposure to covid-19: Secondary | ICD-10-CM | POA: Diagnosis present

## 2020-05-09 DIAGNOSIS — Z79899 Other long term (current) drug therapy: Secondary | ICD-10-CM | POA: Diagnosis not present

## 2020-05-09 DIAGNOSIS — S72401A Unspecified fracture of lower end of right femur, initial encounter for closed fracture: Secondary | ICD-10-CM | POA: Diagnosis not present

## 2020-05-09 DIAGNOSIS — Z8249 Family history of ischemic heart disease and other diseases of the circulatory system: Secondary | ICD-10-CM | POA: Diagnosis not present

## 2020-05-09 DIAGNOSIS — M9711XA Periprosthetic fracture around internal prosthetic right knee joint, initial encounter: Secondary | ICD-10-CM | POA: Diagnosis present

## 2020-05-09 DIAGNOSIS — Z83438 Family history of other disorder of lipoprotein metabolism and other lipidemia: Secondary | ICD-10-CM

## 2020-05-09 DIAGNOSIS — Z6831 Body mass index (BMI) 31.0-31.9, adult: Secondary | ICD-10-CM | POA: Diagnosis not present

## 2020-05-09 DIAGNOSIS — I2510 Atherosclerotic heart disease of native coronary artery without angina pectoris: Secondary | ICD-10-CM | POA: Diagnosis present

## 2020-05-09 DIAGNOSIS — E669 Obesity, unspecified: Secondary | ICD-10-CM | POA: Diagnosis present

## 2020-05-09 DIAGNOSIS — H409 Unspecified glaucoma: Secondary | ICD-10-CM | POA: Diagnosis present

## 2020-05-09 DIAGNOSIS — S60221A Contusion of right hand, initial encounter: Secondary | ICD-10-CM | POA: Diagnosis present

## 2020-05-09 DIAGNOSIS — Z87891 Personal history of nicotine dependence: Secondary | ICD-10-CM | POA: Diagnosis not present

## 2020-05-09 DIAGNOSIS — I152 Hypertension secondary to endocrine disorders: Secondary | ICD-10-CM | POA: Diagnosis present

## 2020-05-09 DIAGNOSIS — R338 Other retention of urine: Secondary | ICD-10-CM | POA: Diagnosis not present

## 2020-05-09 DIAGNOSIS — E785 Hyperlipidemia, unspecified: Secondary | ICD-10-CM | POA: Diagnosis present

## 2020-05-09 DIAGNOSIS — W108XXA Fall (on) (from) other stairs and steps, initial encounter: Secondary | ICD-10-CM | POA: Diagnosis present

## 2020-05-09 DIAGNOSIS — T1490XA Injury, unspecified, initial encounter: Secondary | ICD-10-CM

## 2020-05-09 DIAGNOSIS — E1165 Type 2 diabetes mellitus with hyperglycemia: Secondary | ICD-10-CM | POA: Diagnosis present

## 2020-05-09 DIAGNOSIS — S7001XA Contusion of right hip, initial encounter: Secondary | ICD-10-CM | POA: Diagnosis present

## 2020-05-09 DIAGNOSIS — S7291XA Unspecified fracture of right femur, initial encounter for closed fracture: Secondary | ICD-10-CM | POA: Diagnosis not present

## 2020-05-09 DIAGNOSIS — T40605A Adverse effect of unspecified narcotics, initial encounter: Secondary | ICD-10-CM | POA: Diagnosis not present

## 2020-05-09 DIAGNOSIS — E1159 Type 2 diabetes mellitus with other circulatory complications: Secondary | ICD-10-CM | POA: Diagnosis present

## 2020-05-09 DIAGNOSIS — E1169 Type 2 diabetes mellitus with other specified complication: Secondary | ICD-10-CM | POA: Diagnosis present

## 2020-05-09 DIAGNOSIS — Z7982 Long term (current) use of aspirin: Secondary | ICD-10-CM | POA: Diagnosis not present

## 2020-05-09 DIAGNOSIS — D649 Anemia, unspecified: Secondary | ICD-10-CM | POA: Diagnosis not present

## 2020-05-09 DIAGNOSIS — Y9259 Other trade areas as the place of occurrence of the external cause: Secondary | ICD-10-CM

## 2020-05-09 DIAGNOSIS — Z7984 Long term (current) use of oral hypoglycemic drugs: Secondary | ICD-10-CM

## 2020-05-09 DIAGNOSIS — Z90710 Acquired absence of both cervix and uterus: Secondary | ICD-10-CM

## 2020-05-09 LAB — BASIC METABOLIC PANEL
Anion gap: 9 (ref 5–15)
Anion gap: 12 (ref 5–15)
BUN: 20 mg/dL (ref 8–23)
BUN: 24 mg/dL — ABNORMAL HIGH (ref 8–23)
CO2: 25 mmol/L (ref 22–32)
CO2: 25 mmol/L (ref 22–32)
Calcium: 9.1 mg/dL (ref 8.9–10.3)
Calcium: 9.3 mg/dL (ref 8.9–10.3)
Chloride: 103 mmol/L (ref 98–111)
Chloride: 102 mmol/L (ref 98–111)
Creatinine, Ser: 0.75 mg/dL (ref 0.44–1.00)
Creatinine, Ser: 1.09 mg/dL — ABNORMAL HIGH (ref 0.44–1.00)
GFR, Estimated: >60 mL/min (ref >60)
GFR, Estimated: 52 mL/min — ABNORMAL LOW (ref >60)
Glucose, Bld: 199 mg/dL — ABNORMAL HIGH (ref 70–99)
Glucose, Bld: 222 mg/dL — ABNORMAL HIGH (ref 70–99)
Potassium: 4.2 mmol/L (ref 3.5–5.1)
Potassium: 3.9 mmol/L (ref 3.5–5.1)
Sodium: 137 mmol/L (ref 135–145)
Sodium: 139 mmol/L (ref 135–145)

## 2020-05-09 LAB — CBC WITH DIFFERENTIAL/PLATELET
Abs Immature Granulocytes: 0.03 10*3/uL (ref 0.00–0.07)
Basophils Absolute: 0 10*3/uL (ref 0.0–0.1)
Basophils Relative: 1 %
Eosinophils Absolute: 0.1 10*3/uL (ref 0.0–0.5)
Eosinophils Relative: 1 %
HCT: 31.6 % — ABNORMAL LOW (ref 36.0–46.0)
Hemoglobin: 11.2 g/dL — ABNORMAL LOW (ref 12.0–15.0)
Immature Granulocytes: 0 %
Lymphocytes Relative: 19 %
Lymphs Abs: 1.6 10*3/uL (ref 0.7–4.0)
MCH: 30 pg (ref 26.0–34.0)
MCHC: 35.4 g/dL (ref 30.0–36.0)
MCV: 84.7 fL (ref 80.0–100.0)
Monocytes Absolute: 0.6 10*3/uL (ref 0.1–1.0)
Monocytes Relative: 7 %
Neutro Abs: 6 10*3/uL (ref 1.7–7.7)
Neutrophils Relative %: 72 %
Platelets: 319 10*3/uL (ref 150–400)
RBC: 3.73 MIL/uL — ABNORMAL LOW (ref 3.87–5.11)
RDW: 14.6 % (ref 11.5–15.5)
WBC: 8.3 10*3/uL (ref 4.0–10.5)
nRBC: 0 % (ref 0.0–0.2)

## 2020-05-09 LAB — CBC
HCT: 31.5 % — ABNORMAL LOW (ref 36.0–46.0)
Hemoglobin: 10.9 g/dL — ABNORMAL LOW (ref 12.0–15.0)
MCH: 28.8 pg (ref 26.0–34.0)
MCHC: 34.6 g/dL (ref 30.0–36.0)
MCV: 83.3 fL (ref 80.0–100.0)
Platelets: 314 10*3/uL (ref 150–400)
RBC: 3.78 MIL/uL — ABNORMAL LOW (ref 3.87–5.11)
RDW: 14.6 % (ref 11.5–15.5)
WBC: 8.3 10*3/uL (ref 4.0–10.5)
nRBC: 0 % (ref 0.0–0.2)

## 2020-05-09 LAB — HEMOGLOBIN A1C
Hgb A1c MFr Bld: 6.4 % — ABNORMAL HIGH (ref 4.8–5.6)
Mean Plasma Glucose: 136.98 mg/dL

## 2020-05-09 LAB — GLUCOSE, CAPILLARY: Glucose-Capillary: 198 mg/dL — ABNORMAL HIGH (ref 70–99)

## 2020-05-09 LAB — RESP PANEL BY RT-PCR (FLU A&B, COVID) ARPGX2
Influenza A by PCR: NEGATIVE
Influenza B by PCR: NEGATIVE
SARS Coronavirus 2 by RT PCR: NEGATIVE

## 2020-05-09 MED ORDER — POVIDONE-IODINE 10 % EX SWAB: 2.0000 "application " | Freq: Once | CUTANEOUS | Status: DC

## 2020-05-09 MED ORDER — HYDROCODONE-ACETAMINOPHEN 5-325 MG PO TABS
1.0000 | ORAL_TABLET | Freq: Four times a day (QID) | ORAL | Status: DC | PRN
  Administered 2020-05-09: 2 via ORAL
  Filled 2020-05-09 (×2): qty 2

## 2020-05-09 MED ORDER — ATORVASTATIN CALCIUM 10 MG PO TABS
20.0000 mg | ORAL_TABLET | Freq: Every day | ORAL | Status: DC
  Administered 2020-05-09 – 2020-05-14 (×4): 20 mg via ORAL
  Filled 2020-05-09 (×4): qty 2

## 2020-05-09 MED ORDER — METOPROLOL SUCCINATE ER 50 MG PO TB24: 50.0000 mg | ORAL_TABLET | Freq: Two times a day (BID) | ORAL | Status: DC

## 2020-05-09 MED ORDER — HYDROCODONE-ACETAMINOPHEN 5-325 MG PO TABS
1.0000 | ORAL_TABLET | Freq: Once | ORAL | Status: AC
  Administered 2020-05-09: 1 via ORAL
  Filled 2020-05-09: qty 1

## 2020-05-09 MED ORDER — HYDROMORPHONE HCL 1 MG/ML IJ SOLN
0.5000 mg | INTRAMUSCULAR | Status: DC | PRN
  Administered 2020-05-09 – 2020-05-10 (×2): 0.5 mg via INTRAVENOUS
  Filled 2020-05-09: qty 1
  Filled 2020-05-09: qty 0.5

## 2020-05-09 MED ORDER — CHLORHEXIDINE GLUCONATE 4 % EX LIQD: 60.0000 mL | Freq: Once | CUTANEOUS | Status: DC

## 2020-05-09 MED ORDER — ENOXAPARIN SODIUM 40 MG/0.4ML ~~LOC~~ SOLN: 40.0000 mg | SUBCUTANEOUS | Status: DC

## 2020-05-09 MED ORDER — AMLODIPINE BESYLATE 5 MG PO TABS
5.0000 mg | ORAL_TABLET | Freq: Every day | ORAL | Status: DC
  Administered 2020-05-09 – 2020-05-14 (×4): 5 mg via ORAL
  Filled 2020-05-09 (×4): qty 1

## 2020-05-09 MED ORDER — DORZOLAMIDE HCL-TIMOLOL MAL 2-0.5 % OP SOLN
1.0000 [drp] | Freq: Two times a day (BID) | OPHTHALMIC | Status: DC
  Administered 2020-05-09 – 2020-05-14 (×10): 1 [drp] via OPHTHALMIC
  Filled 2020-05-09: qty 10

## 2020-05-09 MED ORDER — ASPIRIN EC 81 MG PO TBEC
81.0000 mg | DELAYED_RELEASE_TABLET | Freq: Every day | ORAL | Status: DC
  Administered 2020-05-09 – 2020-05-11 (×2): 81 mg via ORAL
  Filled 2020-05-09 (×2): qty 1

## 2020-05-09 MED ORDER — POLYETHYLENE GLYCOL 3350 17 G PO PACK: 17.0000 g | PACK | Freq: Every day | ORAL | Status: DC | PRN

## 2020-05-09 MED ORDER — KETOROLAC TROMETHAMINE 30 MG/ML IJ SOLN
INTRAMUSCULAR | Status: AC
  Administered 2020-05-09: 15 mg via INTRAVENOUS
  Filled 2020-05-09: qty 1

## 2020-05-09 MED ORDER — LATANOPROST 0.005 % OP SOLN
1.0000 [drp] | Freq: Every day | OPHTHALMIC | Status: DC
  Administered 2020-05-09 – 2020-05-13 (×5): 1 [drp] via OPHTHALMIC
  Filled 2020-05-09: qty 2.5

## 2020-05-09 MED ORDER — MORPHINE SULFATE (PF) 2 MG/ML IV SOLN: 0.5000 mg | INTRAVENOUS | Status: DC | PRN

## 2020-05-09 MED ORDER — INSULIN ASPART 100 UNIT/ML ~~LOC~~ SOLN
0.0000 [IU] | Freq: Three times a day (TID) | SUBCUTANEOUS | Status: DC
  Administered 2020-05-10: 2 [IU] via SUBCUTANEOUS
  Administered 2020-05-10: 3 [IU] via SUBCUTANEOUS
  Filled 2020-05-09: qty 0.09

## 2020-05-09 MED ORDER — LOSARTAN POTASSIUM 50 MG PO TABS
50.0000 mg | ORAL_TABLET | Freq: Every day | ORAL | Status: DC
  Administered 2020-05-09: 50 mg via ORAL
  Filled 2020-05-09: qty 2

## 2020-05-09 MED ORDER — METOPROLOL SUCCINATE ER 50 MG PO TB24
50.0000 mg | ORAL_TABLET | Freq: Every day | ORAL | Status: DC
  Administered 2020-05-09: 50 mg via ORAL
  Filled 2020-05-09: qty 1

## 2020-05-09 MED ORDER — CEFAZOLIN SODIUM-DEXTROSE 2-4 GM/100ML-% IV SOLN
2.0000 g | INTRAVENOUS | Status: AC
  Administered 2020-05-10: 2 g via INTRAVENOUS
  Filled 2020-05-09 (×2): qty 100

## 2020-05-09 MED ORDER — KETOROLAC TROMETHAMINE 15 MG/ML IJ SOLN: 15.0000 mg | Freq: Once | INTRAMUSCULAR | Status: DC

## 2020-05-09 MED ORDER — BRIMONIDINE TARTRATE 0.2 % OP SOLN
1.0000 [drp] | Freq: Two times a day (BID) | OPHTHALMIC | Status: DC
  Administered 2020-05-09 – 2020-05-14 (×10): 1 [drp] via OPHTHALMIC
  Filled 2020-05-09: qty 5

## 2020-05-09 NOTE — ED Notes (Signed)
Sent message to Pharmacist to please verify medications. Pharmacist said "This patient has a bed ready at [facility] that's why we have not verified the bulk medications" Ardeen Garland, [facility].

## 2020-05-09 NOTE — ED Triage Notes (Addendum)
Trip and fall at doctors office today. Denies head injury. Denies LOC. C/o right knee and hip pain. Abrasion to right hand. Denies wrist pain

## 2020-05-09 NOTE — ED Notes (Signed)
Attempted to call report to [place] [facility]. No answer

## 2020-05-09 NOTE — Plan of Care (Signed)
Patient stable went arriving to unit per [facility] from [facility]. Patient VS stable. Pain under control with PO meds. Patient called family member on arrival. Abrasion to the right knee and right hand from fall, otherwise skin is intact. Will continue to monitor patient.     Problem: Education: Goal: Knowledge of General Education information will improve Description: Including pain rating scale, medication(s)/side effects and non-pharmacologic comfort measures Outcome: Progressing   Problem: Activity: Goal: Risk for activity intolerance will decrease Outcome: Progressing   Problem: Pain Managment: Goal: General experience of comfort will improve Outcome: Progressing   Problem: Safety: Goal: Ability to remain free from injury will improve Outcome: Progressing   Problem: Skin Integrity: Goal: Risk for impaired skin integrity will decrease Outcome: Progressing

## 2020-05-09 NOTE — Progress Notes (Signed)
Consult received for comminuted R ppx femur fracture. EDP states hospitalist has been consulted for admission. Due to complexity of this injury, I have discussed the patient the Dr. Doreatha Martin, ortho trauma, who recommends transfer [facility] tomorrow. NPO after MN. Hold chemical DVT ppx. Discussed with patient/ husband.

## 2020-05-09 NOTE — ED Provider Notes (Signed)
[facility] [place] DEPT Provider Note   CSN: [id number] Arrival date & time: 05/09/20  1045     History Chief Complaint  Patient presents with  . Fall    Angela Chambers is a 79 y.o. female.  Pt presents to the ED today with a fall.  She was walking into the cardiology office for her routine yearly appt.  She missed a step and tripped and fell.  She did hit her head, but denies loc.  She is not on blood thinners.  Pt denies headache or n/v or dizziness.  Pt has right hand, knee, and hip pain.        Past Medical History:  Diagnosis Date  . Arthritis   . Cataract   . Diabetes mellitus   . Fibroids   . Glaucoma   . Hyperlipidemia   . Hypertension 07 /2013    WITH EF GREATER THAN 55 % WITH MILD MR BUT NO PROLASPE ,ESSENTIALLY NORMAL DONE TO EVALUATE PALPITATIONS .  Marland Kitchen Obesity (BMI 30.0-34.9)   . Osteoporosis   . Palpitations    CONTROLLED ON BETA BLOCKER    Patient Active Problem List   Diagnosis Date Noted  . Femur fracture ([place]) 05/09/2020  . Hypertension associated with diabetes ([place]) 08/16/2018  . Abnormal stress echo EKG portion with normal echo -> imaging confirmed the correct with normal coronaries 06/21/2015  . OA (osteoarthritis) of knee 09/25/2013  . Obesity (BMI 30.0-34.9)   . Hypertensive heart disease without CHF 11/30/2011  . Hyperlipidemia 08/25/2011  . Other and combined forms of senile cataract 07/04/2011  . Primary open angle glaucoma of both eyes, severe stage 07/04/2011  . Dyslipidemia associated with type 2 diabetes mellitus ([place]) 02/22/2009  . PERSONAL HISTORY OF COLONIC POLYPS 02/22/2009    Past Surgical History:  Procedure Laterality Date  . ABDOMINAL HYSTERECTOMY  10/18/ 1982   TAH-BSO for fibroids and adenomatous hyperplasia  . CARDIAC CATHETERIZATION  08/2009   NONOBSTRUCTIVE CAD WITH 20% IN THE LAD AND 20 % IN THE RCA NORMAL EF 65%  . CARDIAC CATHETERIZATION N/A 07/05/2015   Procedure: Left Heart Cath and  Coronary Angiography;  Surgeon: Leonie Man, MD;  Location: [facility];  Service: Cardiovascular: Angiographically minimal CAD. EF 60-65%  . CPET-MET Test (Cardiopulmonary Exercise Test)  03/06/2013   Adequate effort at 1.11. Peak VO2 12.3 is 86% in the normal range. Heart rate was just outside the normal range target being 127 beats a minute but this was 86% of projected. She did have an ischemic pattern after the anterograde threshold with increased heart rate and blunting of stroke volume during the last 3 minutes of exercise, but was asymptomatic.  Marland Kitchen JOINT REPLACEMENT  09/05/2009   L knee  . KNEE SURGERY  1984   per pt screws in R knee  . NM MYOVIEW LTD  06/2015   FALSE + GXT - Images Accurate:  TM - 6 min, 7 METs (93% MPHR) --> chest discomfort, notable ST depressions => Hight Risk Duke TM Score, but NO scintigraphic evidence of ischemia or infarction. EF 74$  . 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 TRANSTHORACIC ECHOCARDIOGRAM  July 2013   EF greater than 55%, mild MR no prolapse.  Normal  . TUBAL LIGATION       OB History   No obstetric history on file.  Family History  Problem Relation Age of Onset  . Diabetes Mother   . Heart disease Mother   . Hyperlipidemia Mother   . Diabetes Sister   . Hyperlipidemia Sister   . Hypertension Sister   . Diabetes Brother   . Hyperlipidemia Brother   . Prostate cancer Son        prostate  . Hyperlipidemia Sister     Social History   Tobacco Use  . Smoking status: Former Smoker    Packs/day: 0.50    Years: 20.00    Pack years: 10.00    Types: Cigarettes    Quit date: 07/13/1981    Years since quitting: 38.8  . Smokeless tobacco: Never Used  Vaping Use  . Vaping Use: Never used  Substance Use Topics  . Alcohol use: No  . Drug use: No    Home Medications Prior to  Admission medications   Medication Sig Start Date End Date Taking? Authorizing Provider  amLODipine (NORVASC) 5 MG tablet TAKE 1 TABLET(5 MG) BY MOUTH DAILY Patient taking differently: Take 5 mg by mouth daily. 01/31/20  Yes SagardiaInes Bloomer, MD  aspirin 81 MG tablet Take 81 mg by mouth daily.   Yes [provider]  atorvastatin (LIPITOR) 20 MG tablet Take 1 tablet (20 mg total) by mouth daily. 08/29/19  Yes Sagardia, Ines Bloomer, MD  brimonidine (ALPHAGAN) 0.2 % ophthalmic solution Place 1 drop into both eyes 2 (two) times daily. 05/06/20  Yes [provider]  Chlorpheniramine-Acetaminophen (CORICIDIN HBP COLD/FLU PO) Take 1 tablet by mouth as needed (sneezing/congestion).   Yes [provider]  Co-Enzyme Q-10 30 MG CAPS Take 30 mg by mouth daily.   Yes [provider]  dorzolamide-timolol (COSOPT) 22.3-6.8 MG/ML ophthalmic solution Place 1 drop into both eyes 2 (two) times daily. 09/23/17  Yes [provider]  glipiZIDE (GLUCOTROL) 5 MG tablet TAKE 1 TABLET(5 MG) BY MOUTH TWICE DAILY BEFORE A MEAL Patient taking differently: Take 5 mg by mouth 2 (two) times daily before a meal. 04/15/20  Yes Sagardia, Ines Bloomer, MD  hydrochlorothiazide (HYDRODIURIL) 25 MG tablet Take 1 tablet (25 mg total) by mouth daily. 08/22/19 11/20/19 Yes Sagardia, Ines Bloomer, MD  ibuprofen (ADVIL) 200 MG tablet Take 200 mg by mouth every 6 (six) hours as needed for fever, headache, cramping or mild pain.   Yes [provider]  latanoprost (XALATAN) 0.005 % ophthalmic solution Place 1 drop into both eyes at bedtime. 09/21/18  Yes [provider]  losartan (COZAAR) 50 MG tablet Take 50 mg by mouth daily.   Yes [provider]  metFORMIN (GLUCOPHAGE) 500 MG tablet Take 1 tablet (500 mg total) by mouth 2 (two) times daily with a meal. 08/22/19 11/20/19 Yes Sagardia, Ines Bloomer, MD  metoprolol succinate (TOPROL-XL) 50 MG 24 hr tablet TAKE 1 TABLET BY MOUTH  EVERY DAY TAKE IMMEDIATELY FOLLOWING A MEAL Patient taking differently: Take 50 mg by mouth in the morning and at bedtime. 01/23/20  Yes Leonie Man, MD  Multiple Vitamin (MULTIVITAMIN) tablet Take 1 tablet by mouth daily.   Yes [provider]  Blood Glucose Monitoring Suppl (BLOOD GLUCOSE MONITOR SYSTEM) w/Device KIT 1 Units by Does not apply route 2 (two) times daily. 02/07/18   Tenna Delaine D, PA-C  losartan (COZAAR) 100 MG tablet TAKE 1 TABLET(100 MG) BY MOUTH DAILY Patient not taking: Reported on 05/09/2020 05/06/20   Horald Pollen, MD    Allergies    Lisinopril and  Metformin and related  Review of Systems   Review of Systems  Musculoskeletal:       Right hand, right knee, right knee pain  Skin: Positive for wound.  All other systems reviewed and are negative.   Physical Exam Updated Vital Signs BP (!) 157/102 (BP Location: Left Arm)   Pulse 82   Temp 98 F (36.7 C) (Oral)   Resp 16   LMP  (LMP Unknown)   SpO2 96%   Physical Exam Vitals and nursing note reviewed.  Constitutional:      Appearance: Normal appearance.  HENT:     Head: Normocephalic and atraumatic.     Right Ear: External ear normal.     Left Ear: External ear normal.     Nose: Nose normal.     Mouth/Throat:     Mouth: Mucous membranes are moist.     Pharynx: Oropharynx is clear.  Eyes:     Extraocular Movements: Extraocular movements intact.     Conjunctiva/sclera: Conjunctivae normal.     Pupils: Pupils are equal, round, and reactive to light.  Cardiovascular:     Rate and Rhythm: Normal rate and regular rhythm.     Pulses: Normal pulses.     Heart sounds: Normal heart sounds.  Pulmonary:     Effort: Pulmonary effort is normal.     Breath sounds: Normal breath sounds.  Abdominal:     General: Abdomen is flat. Bowel sounds are normal.     Palpations: Abdomen is soft.  Musculoskeletal:     Cervical back: Normal range of motion and neck supple.     Right knee: Swelling  and effusion present. Decreased range of motion. Tenderness present.       Legs:  Skin:    General: Skin is warm.     Capillary Refill: Capillary refill takes less than 2 seconds.     Comments: Right palm abrasion  Neurological:     General: No focal deficit present.     Mental Status: She is alert and oriented to person, place, and time.  Psychiatric:        Mood and Affect: Mood normal.        Behavior: Behavior normal.     ED Results / Procedures / Treatments   Labs (all labs ordered are listed, but only abnormal results are displayed) Labs Reviewed  RESP PANEL BY RT-PCR (FLU A&B, COVID) [id number]  BASIC METABOLIC PANEL  CBC WITH DIFFERENTIAL/PLATELET  URINALYSIS, ROUTINE W REFLEX MICROSCOPIC    EKG None  Radiology DG Chest 2 View  Result Date: 05/09/2020 CLINICAL DATA:  fall EXAM: CHEST - 2 VIEW COMPARISON:  09/19/2013 and prior. FINDINGS: No focal consolidation. No pneumothorax or pleural effusion. Cardiomediastinal silhouette is within normal limits. No acute osseous abnormality. IMPRESSION: No focal airspace disease. Electronically Signed   By: Primitivo Gauze M.D.   On: 05/09/2020 15:00   DG Knee Complete 4 Views Right  Result Date: 05/09/2020 CLINICAL DATA:  Tripped and fell at [place] office today, RIGHT knee and hip pain EXAM: RIGHT KNEE - COMPLETE 4+ VIEW COMPARISON:  05/24/2012 FINDINGS: Interval RIGHT total knee arthroplasty. Diffuse osseous demineralization. Comminuted oblique fracture of distal RIGHT femoral metaphysis, displaced and apex anteriorly angulated, extending into femoral component of knee prosthesis. No dislocation. Visualized tibia and fibula appear intact. IMPRESSION: Prior RIGHT total knee arthroplasty. Osseous demineralization with comminuted displaced and angulated distal RIGHT femoral metaphyseal fracture extending to knee prosthesis. Electronically Signed   By: Crist Infante.D.  On: 05/09/2020 13:16   DG Hand Complete Right  Result Date:  05/09/2020 CLINICAL DATA:  Tripped and fell at [place] office today, RIGHT knee and hip pain, abrasion RIGHT hand EXAM: RIGHT HAND - COMPLETE 3+ VIEW COMPARISON:  None FINDINGS: Osseous demineralization. Degenerative changes at first [facility] joint, minimally at first MCP joint. Slight widening of scapholunate interval question torn scapholunate ligament. 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 Scattered narrowing of IP joints. No acute fracture, dislocation, or bone destruction. Question small juxta-articular erosion at fourth MCP joint. IMPRESSION: Scattered degenerative changes as above. Questionable tiny erosion at the fourth MCP joint Question torn scapholunate ligament. Electronically Signed   By: Lavonia Dana M.D.   On: 05/09/2020 13:15   DG Hip Unilat W or Wo Pelvis 2-3 Views Right  Result Date: 05/09/2020 CLINICAL DATA:  Tripped and fell at [place] office today, RIGHT hip and knee pain EXAM: DG HIP (WITH OR WITHOUT PELVIS) 2-3V RIGHT COMPARISON:  None FINDINGS: Osseous demineralization. Narrowing of the hip joints bilaterally. SI joints unremarkable. No acute fracture, dislocation, or bone destruction. IMPRESSION: No acute osseous abnormalities. Osseous demineralization with mild degenerative changes of the hip joints. Please refer to RIGHT knee radiographic exam for description of distal RIGHT femoral fracture. Electronically Signed   By: Lavonia Dana M.D.   On: 05/09/2020 13:17    Procedures Procedures (including critical care time)  Medications Ordered in ED Medications  HYDROcodone-acetaminophen (NORCO/VICODIN) 5-325 MG per tablet 1 tablet (1 tablet Oral Given 05/09/20 1120)    ED Course  I have reviewed the triage vital signs and the nursing notes.  Pertinent labs & imaging results that were available during my care of the patient were reviewed by me and considered in my medical decision making (see chart for details).    MDM Rules/Calculators/A&P                         Pt given  lortab in ED for pain.  She has not wanted anything else for her pain.  Pt d/w Dr. Lyla Glassing who is on call for Dr. Maureen Ralphs who did pt's knee replacement. He did see pt in the ED.  He said pt will need surgery and d/w Dr. Doreatha Martin who can repair knee fracture tomorrow at [facility].  Pt d/w Dr. Neysa Bonito (triad) for admission.   Final Clinical Impression(s) / ED Diagnoses Final diagnoses:  Other closed fracture of distal end of right femur, initial encounter ([place])  Fall, initial encounter  Contusion of right hand, initial encounter  Contusion of right hip, initial encounter    Rx / DC Orders ED Discharge Orders    None       Isla Pence, MD 05/09/20 [phone]

## 2020-05-09 NOTE — ED Notes (Signed)
Called to give report to 5N at [facility], charge nurse told me to call nurse back because she is getting report from other patients now.

## 2020-05-09 NOTE — H&P (Signed)
History and Physical        Hospital Admission Note Date: 05/09/2020  Patient name: Angela Chambers Medical record number: [id number] Date of birth: 08/03/1940 Age: 79 y.o. Gender: female  PCP: Horald Pollen, MD  Patient coming from: Home Lives with: Husband At baseline, ambulates: Independently  Chief Complaint    Chief Complaint  Patient presents with  . Fall      HPI:   This is a 80 year old female with past medical history of hypertension, diabetes, hyperlipidemia, osteoporosis who presented to the ED after a trip and fall at her doctor's office today. Patient was walking into the cardiology office for a routine yearly appointment and she missed a step, tripped and fell. She did not hit her head and denies any loss of consciousness and is not on any blood thinners. No headache, nausea or vomiting or dizziness.  Does have some right knee pain but otherwise no complaints   ED Course: Afebrile, hemodynamically stable, on room air. Labs pending. Notable Imaging: Right knee XR-comminuted displaced and angulated distal right femoral metaphyseal fracture extending to knee prosthesis with a prior right total knee arthroplasty. Right hand XR-questionable tiny erosion at fourth MCP joint, questionable torn scapholunate ligament and scattered degenerative changes. Patient received Norco. ED provider discussed with Dr. Lyla Glassing who is on-call for Dr. Maureen Ralphs who did patient's knee replacement and plan is to have patient transferred to [facility] for surgery in a.m. with Dr. Doreatha Martin.   Vitals:   05/09/20 1654 05/09/20 1700  BP: (!) 124/99 (!) 119/58  Pulse: 66 75  Resp:  20  Temp:    SpO2:  96%     Review of Systems:  Review of Systems  All other systems reviewed and are negative.   Medical/Social/Family History   Past Medical History: Past Medical History:  Diagnosis  Date  . Arthritis   . Cataract   . Diabetes mellitus   . Fibroids   . Glaucoma   . Hyperlipidemia   . Hypertension 07 /2013    WITH EF GREATER THAN 55 % WITH MILD MR BUT NO PROLASPE ,ESSENTIALLY NORMAL DONE TO EVALUATE PALPITATIONS .  Marland Kitchen Obesity (BMI 30.0-34.9)   . Osteoporosis   . Palpitations    CONTROLLED ON BETA BLOCKER    Past Surgical History:  Procedure Laterality Date  . ABDOMINAL HYSTERECTOMY  10/18/ 1982   TAH-BSO for fibroids and adenomatous hyperplasia  . CARDIAC CATHETERIZATION  08/2009   NONOBSTRUCTIVE CAD WITH 20% IN THE LAD AND 20 % IN THE RCA NORMAL EF 65%  . CARDIAC CATHETERIZATION N/A 07/05/2015   Procedure: Left Heart Cath and Coronary Angiography;  Surgeon: Leonie Man, MD;  Location: [facility];  Service: Cardiovascular: Angiographically minimal CAD. EF 60-65%  . CPET-MET Test (Cardiopulmonary Exercise Test)  03/06/2013   Adequate effort at 1.11. Peak VO2 12.3 is 86% in the normal range. Heart rate was just outside the normal range target being 127 beats a minute but this was 86% of projected. She did have an ischemic pattern after the anterograde threshold with increased heart rate and blunting of stroke volume during the last 3 minutes of exercise, but was asymptomatic.  Marland Kitchen JOINT REPLACEMENT  09/05/2009  L knee  . KNEE SURGERY  1984   per pt screws in R knee  . NM MYOVIEW LTD  06/2015   FALSE + GXT - Images Accurate:  TM - 6 min, 7 METs (93% MPHR) --> chest discomfort, notable ST depressions => Hight Risk Duke TM Score, but NO scintigraphic evidence of ischemia or infarction. EF 74$  . 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 TRANSTHORACIC ECHOCARDIOGRAM  July 2013   EF greater than 55%, mild MR no prolapse.  Normal  . TUBAL LIGATION      Medications: Prior to Admission medications   Medication Sig Start Date  End Date Taking? Authorizing Provider  amLODipine (NORVASC) 5 MG tablet TAKE 1 TABLET(5 MG) BY MOUTH DAILY Patient taking differently: Take 5 mg by mouth daily. 01/31/20  Yes SagardiaInes Bloomer, MD  aspirin 81 MG tablet Take 81 mg by mouth daily.   Yes [provider]  atorvastatin (LIPITOR) 20 MG tablet Take 1 tablet (20 mg total) by mouth daily. 08/29/19  Yes Sagardia, Ines Bloomer, MD  brimonidine (ALPHAGAN) 0.2 % ophthalmic solution Place 1 drop into both eyes 2 (two) times daily. 05/06/20  Yes [provider]  Chlorpheniramine-Acetaminophen (CORICIDIN HBP COLD/FLU PO) Take 1 tablet by mouth as needed (sneezing/congestion).   Yes [provider]  Co-Enzyme Q-10 30 MG CAPS Take 30 mg by mouth daily.   Yes [provider]  dorzolamide-timolol (COSOPT) 22.3-6.8 MG/ML ophthalmic solution INSTILL 1 DROP INTO BOTH EYES TWICE A DAY 09/23/17  Yes [provider]  glipiZIDE (GLUCOTROL) 5 MG tablet TAKE 1 TABLET(5 MG) BY MOUTH TWICE DAILY BEFORE A MEAL 04/15/20  Yes Sagardia, Ines Bloomer, MD  hydrochlorothiazide (HYDRODIURIL) 25 MG tablet Take 1 tablet (25 mg total) by mouth daily. 08/22/19 11/20/19 Yes Sagardia, Ines Bloomer, MD  ibuprofen (ADVIL) 200 MG tablet Take 200 mg by mouth every 6 (six) hours as needed for fever, headache, cramping or mild pain.   Yes [provider]  latanoprost (XALATAN) 0.005 % ophthalmic solution Place 1 drop into both eyes at bedtime. 09/21/18  Yes [provider]  losartan (COZAAR) 50 MG tablet Take 50 mg by mouth daily.   Yes [provider]  metFORMIN (GLUCOPHAGE) 500 MG tablet Take 1 tablet (500 mg total) by mouth 2 (two) times daily with a meal. 08/22/19 11/20/19 Yes Sagardia, Ines Bloomer, MD  metoprolol succinate (TOPROL-XL) 50 MG 24 hr tablet TAKE 1 TABLET BY MOUTH EVERY DAY TAKE IMMEDIATELY FOLLOWING A MEAL Patient taking differently: Take 50 mg by mouth in the morning and at bedtime. 01/23/20  Yes  Leonie Man, MD  Multiple Vitamin (MULTIVITAMIN) tablet Take 1 tablet by mouth daily.   Yes [provider]  Blood Glucose Monitoring Suppl (BLOOD GLUCOSE MONITOR SYSTEM) w/Device KIT 1 Units by Does not apply route 2 (two) times daily. 02/07/18   Tenna Delaine D, PA-C  losartan (COZAAR) 100 MG tablet TAKE 1 TABLET(100 MG) BY MOUTH DAILY 05/06/20   Horald Pollen, MD  PRESCRIPTION MEDICATION Timolol 0.05 % taking 1 drop to each eye every am    [provider]  Travoprost, BAK Free, (TRAVATAN) 0.004 % SOLN ophthalmic solution Place 1 drop into both eyes at bedtime.    [provider]    Allergies:   Allergies  Allergen Reactions  . Lisinopril Cough  .  Metformin And Related     Diarrhea on 1074m BID, tolerates 500 mg BID    Social History:  reports that she quit smoking about 38 years ago. Her smoking use included cigarettes. She has a 10.00 pack-year smoking history. She has never used smokeless tobacco. She reports that she does not drink alcohol and does not use drugs.  Family History: Family History  Problem Relation Age of Onset  . Diabetes Mother   . Heart disease Mother   . Hyperlipidemia Mother   . Diabetes Sister   . Hyperlipidemia Sister   . Hypertension Sister   . Diabetes Brother   . Hyperlipidemia Brother   . Prostate cancer Son        prostate  . Hyperlipidemia Sister      Objective   Physical Exam: Blood pressure (!) 119/58, pulse 75, temperature 98 F (36.7 C), temperature source Oral, resp. rate 20, SpO2 96 %.  Physical Exam Vitals and nursing note reviewed.  Constitutional:      Appearance: Normal appearance.  HENT:     Head: Normocephalic and atraumatic.  Eyes:     Conjunctiva/sclera: Conjunctivae normal.  Cardiovascular:     Rate and Rhythm: Normal rate and regular rhythm.  Pulmonary:     Effort: Pulmonary effort is normal.     Breath sounds: Normal breath sounds.  Abdominal:     General: Abdomen is  flat.     Palpations: Abdomen is soft.  Musculoskeletal:     Comments: Right knee tenderness  Skin:    Coloration: Skin is not jaundiced or pale.  Neurological:     Mental Status: She is alert. Mental status is at baseline.  Psychiatric:        Mood and Affect: Mood normal.        Behavior: Behavior normal.     LABS on Admission: I have personally reviewed all the labs and imaging below    Basic Metabolic Panel: Recent Labs  Lab 05/09/20 1555  NA 137  K 4.2  CL 103  CO2 25  GLUCOSE 199*  BUN 20  CREATININE 0.75  CALCIUM 9.1   Liver Function Tests: No results for input(s): AST, ALT, ALKPHOS, BILITOT, PROT, ALBUMIN in the last 168 hours. No results for input(s): LIPASE, AMYLASE in the last 168 hours. No results for input(s): AMMONIA in the last 168 hours. CBC: Recent Labs  Lab 05/09/20 1555  WBC 8.3  NEUTROABS 6.0  HGB 11.2*  HCT 31.6*  MCV 84.7  PLT 319   Cardiac Enzymes: No results for input(s): CKTOTAL, CKMB, CKMBINDEX, TROPONINI in the last 168 hours. BNP: Invalid input(s): POCBNP CBG: No results for input(s): GLUCAP in the last 168 hours.  Radiological Exams on Admission:  DG Chest 2 View  Result Date: 05/09/2020 CLINICAL DATA:  fall EXAM: CHEST - 2 VIEW COMPARISON:  09/19/2013 and prior. FINDINGS: No focal consolidation. No pneumothorax or pleural effusion. Cardiomediastinal silhouette is within normal limits. No acute osseous abnormality. IMPRESSION: No focal airspace disease. Electronically Signed   By: CPrimitivo GauzeM.D.   On: 05/09/2020 15:00   DG Knee Complete 4 Views Right  Result Date: 05/09/2020 CLINICAL DATA:  [place], RIGHT knee and hip pain EXAM: RIGHT KNEE - COMPLETE 4+ VIEW COMPARISON:  05/24/2012 FINDINGS: Interval RIGHT total knee arthroplasty. Diffuse osseous demineralization. Comminuted oblique fracture of distal RIGHT femoral metaphysis, displaced and apex anteriorly angulated, extending into  femoral component of knee prosthesis. No dislocation. Visualized tibia  and fibula appear intact. IMPRESSION: Prior RIGHT total knee arthroplasty. Osseous demineralization with comminuted displaced and angulated distal RIGHT femoral metaphyseal fracture extending to knee prosthesis. Electronically Signed   By: Lavonia Dana M.D.   On: 05/09/2020 13:16   DG Hand Complete Right  Result Date: 05/09/2020 CLINICAL DATA:  Tripped and fell at [place] office today, RIGHT knee and hip pain, abrasion RIGHT hand EXAM: RIGHT HAND - COMPLETE 3+ VIEW COMPARISON:  None FINDINGS: Osseous demineralization. Degenerative changes at first [facility] joint, minimally at first MCP joint. Slight widening of scapholunate interval question torn scapholunate ligament. 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 Scattered narrowing of IP joints. No acute fracture, dislocation, or bone destruction. Question small juxta-articular erosion at fourth MCP joint. IMPRESSION: Scattered degenerative changes as above. Questionable tiny erosion at the fourth MCP joint Question torn scapholunate ligament. Electronically Signed   By: Lavonia Dana M.D.   On: 05/09/2020 13:15   DG Hip Unilat W or Wo Pelvis 2-3 Views Right  Result Date: 05/09/2020 CLINICAL DATA:  Tripped and fell at [place] office today, RIGHT hip and knee pain EXAM: DG HIP (WITH OR WITHOUT PELVIS) 2-3V RIGHT COMPARISON:  None FINDINGS: Osseous demineralization. Narrowing of the hip joints bilaterally. SI joints unremarkable. No acute fracture, dislocation, or bone destruction. IMPRESSION: No acute osseous abnormalities. Osseous demineralization with mild degenerative changes of the hip joints. Please refer to RIGHT knee radiographic exam for description of distal RIGHT femoral fracture. Electronically Signed   By: Lavonia Dana M.D.   On: 05/09/2020 13:17      EKG: not done   A & P   Principal Problem:   Femur fracture (HCC) Active Problems:   Dyslipidemia associated with type 2  diabetes mellitus ([place])   Hypertension associated with diabetes ([place])   1. Right distal femur fracture s/p mechanical fall from standing height a. Continue with VTE prophylaxis and pain control b. Ortho on board, plan for surgery in a.[facility]. [facility]  2. Hypertension a. Continue home amlodipine and Toprol-XL  3. Hyperlipidemia a. Continue home statin  4. Type 2 diabetes a. Hold home meds b. Sliding scale insulin c. Check HbA1c d. Heart healthy carb modified diet  DVT prophylaxis: Lovenox   Code Status: Full Code  Diet: Heart healthy/carb modified, n.p.o. after midnight Family Communication: Admission, patients condition and plan of care including tests being ordered have been discussed with the patient who indicates understanding and agrees with the plan and Code Status. Patient's husband at bedside was updated  Disposition Plan: The appropriate patient status for this patient is INPATIENT. Inpatient status is judged to be reasonable and necessary in order to provide the required intensity of service to ensure the patient's safety. The patient's presenting symptoms, physical exam findings, and initial radiographic and laboratory data in the context of their chronic comorbidities is felt to place them at high risk for further clinical deterioration. Furthermore, it is not anticipated that the patient will be medically stable for discharge from the hospital within 2 midnights of admission. The following factors support the patient status of inpatient.   " The patient's presenting symptoms include Mechanical fall with right knee pain . " The worrisome physical exam findings include Right lower extremity pain . " The initial radiographic and laboratory data are worrisome because of Right distal femur fracture . " The chronic co-morbidities include Hypertension .   * I certify that at the point of admission it is my clinical judgment that the patient will require inpatient hospital care  spanning  beyond 2 midnights from the point of admission due to high intensity of service, high risk for further deterioration and high frequency of surveillance required.*   Status is: Inpatient  Remains inpatient appropriate because:Ongoing diagnostic testing needed not appropriate for outpatient work up and Inpatient level of care appropriate due to severity of illness   Dispo: The patient is from: Home              Anticipated d/c is to: Home              Anticipated d/c date is: 2 days              Patient currently is not medically stable to d/c.       Consultants  . Orthopedic surgery  Procedures  . None  Time Spent on Admission: 60 minutes    Harold Hedge, DO [facility]  05/09/2020, 5:28 PM

## 2020-05-10 ENCOUNTER — Inpatient Hospital Stay (HOSPITAL_COMMUNITY): Payer: Medicare Other

## 2020-05-10 ENCOUNTER — Encounter (HOSPITAL_COMMUNITY): Payer: Self-pay | Admitting: Internal Medicine

## 2020-05-10 ENCOUNTER — Inpatient Hospital Stay (HOSPITAL_COMMUNITY): Payer: Medicare Other | Admitting: Anesthesiology

## 2020-05-10 ENCOUNTER — Encounter (HOSPITAL_COMMUNITY)
Admission: EM | Disposition: A | Discharge status: Home Health | Payer: Self-pay | Source: Home / Self Care | Attending: Student

## 2020-05-10 DIAGNOSIS — E876 Hypokalemia: Secondary | ICD-10-CM

## 2020-05-10 DIAGNOSIS — W19XXXA Unspecified fall, initial encounter: Secondary | ICD-10-CM

## 2020-05-10 DIAGNOSIS — E1165 Type 2 diabetes mellitus with hyperglycemia: Secondary | ICD-10-CM

## 2020-05-10 DIAGNOSIS — S72401A Unspecified fracture of lower end of right femur, initial encounter for closed fracture: Secondary | ICD-10-CM

## 2020-05-10 DIAGNOSIS — I2510 Atherosclerotic heart disease of native coronary artery without angina pectoris: Secondary | ICD-10-CM

## 2020-05-10 HISTORY — PX: ORIF FEMUR FRACTURE: SHX2119

## 2020-05-10 LAB — GLUCOSE, CAPILLARY
Glucose-Capillary: 170 mg/dL — ABNORMAL HIGH (ref 70–99)
Glucose-Capillary: 174 mg/dL — ABNORMAL HIGH (ref 70–99)
Glucose-Capillary: 194 mg/dL — ABNORMAL HIGH (ref 70–99)
Glucose-Capillary: 202 mg/dL — ABNORMAL HIGH (ref 70–99)
Glucose-Capillary: 310 mg/dL — ABNORMAL HIGH (ref 70–99)

## 2020-05-10 LAB — CK: Total CK: 56 U/L (ref 38–234)

## 2020-05-10 LAB — CBC
HCT: 26.8 % — ABNORMAL LOW (ref 36.0–46.0)
Hemoglobin: 9.4 g/dL — ABNORMAL LOW (ref 12.0–15.0)
MCH: 29.5 pg (ref 26.0–34.0)
MCHC: 35.1 g/dL (ref 30.0–36.0)
MCV: 84 fL (ref 80.0–100.0)
Platelets: 251 10*3/uL (ref 150–400)
RBC: 3.19 MIL/uL — ABNORMAL LOW (ref 3.87–5.11)
RDW: 14.5 % (ref 11.5–15.5)
WBC: 5.1 10*3/uL (ref 4.0–10.5)
nRBC: 0 % (ref 0.0–0.2)

## 2020-05-10 LAB — RENAL FUNCTION PANEL
Albumin: 3.1 g/dL — ABNORMAL LOW (ref 3.5–5.0)
Anion gap: 11 (ref 5–15)
BUN: 23 mg/dL (ref 8–23)
CO2: 25 mmol/L (ref 22–32)
Calcium: 8.7 mg/dL — ABNORMAL LOW (ref 8.9–10.3)
Chloride: 100 mmol/L (ref 98–111)
Creatinine, Ser: 0.94 mg/dL (ref 0.44–1.00)
GFR, Estimated: >60 mL/min (ref >60)
Glucose, Bld: 187 mg/dL — ABNORMAL HIGH (ref 70–99)
Phosphorus: 3.7 mg/dL (ref 2.5–4.6)
Potassium: 3.4 mmol/L — ABNORMAL LOW (ref 3.5–5.1)
Sodium: 136 mmol/L (ref 135–145)

## 2020-05-10 LAB — SURGICAL PCR SCREEN
MRSA, PCR: NEGATIVE
Staphylococcus aureus: POSITIVE — AB

## 2020-05-10 LAB — MAGNESIUM: Magnesium: 1.9 mg/dL (ref 1.7–2.4)

## 2020-05-10 LAB — VITAMIN D 25 HYDROXY (VIT D DEFICIENCY, FRACTURES): Vit D, 25-Hydroxy: 37.7 ng/mL (ref 30–100)

## 2020-05-10 SURGERY — OPEN REDUCTION INTERNAL FIXATION (ORIF) DISTAL FEMUR FRACTURE
Anesthesia: General | Site: Leg Upper | Laterality: Right

## 2020-05-10 MED ORDER — ENOXAPARIN SODIUM 40 MG/0.4ML ~~LOC~~ SOLN
40.0000 mg | SUBCUTANEOUS | Status: DC
  Administered 2020-05-11 – 2020-05-14 (×4): 40 mg via SUBCUTANEOUS
  Filled 2020-05-10 (×4): qty 0.4

## 2020-05-10 MED ORDER — CEFAZOLIN SODIUM-DEXTROSE 2-4 GM/100ML-% IV SOLN
2.0000 g | Freq: Three times a day (TID) | INTRAVENOUS | Status: AC
  Administered 2020-05-10 – 2020-05-11 (×3): 2 g via INTRAVENOUS
  Filled 2020-05-10 (×4): qty 100

## 2020-05-10 MED ORDER — OXYCODONE HCL 5 MG PO TABS
5.0000 mg | ORAL_TABLET | Freq: Once | ORAL | Status: DC | PRN
Start: 2020-05-10 — End: 2020-05-10

## 2020-05-10 MED ORDER — 0.9 % SODIUM CHLORIDE (POUR BTL) OPTIME
TOPICAL | Status: DC | PRN
  Administered 2020-05-10: 1000 mL

## 2020-05-10 MED ORDER — PROPOFOL 10 MG/ML IV BOLUS
INTRAVENOUS | Status: DC | PRN
  Administered 2020-05-10: 150 mg via INTRAVENOUS

## 2020-05-10 MED ORDER — CELECOXIB 100 MG PO CAPS
100.0000 mg | ORAL_CAPSULE | Freq: Two times a day (BID) | ORAL | Status: DC
  Administered 2020-05-10 – 2020-05-11 (×3): 100 mg via ORAL
  Filled 2020-05-10 (×4): qty 1

## 2020-05-10 MED ORDER — HYDROMORPHONE HCL 1 MG/ML IJ SOLN: 0.5000 mg | INTRAMUSCULAR | Status: DC | PRN

## 2020-05-10 MED ORDER — METHOCARBAMOL 500 MG PO TABS: 500.0000 mg | ORAL_TABLET | Freq: Four times a day (QID) | ORAL | Status: DC | PRN

## 2020-05-10 MED ORDER — METOPROLOL SUCCINATE ER 50 MG PO TB24
50.0000 mg | ORAL_TABLET | Freq: Every day | ORAL | Status: DC
  Administered 2020-05-10 – 2020-05-14 (×5): 50 mg via ORAL
  Filled 2020-05-10 (×5): qty 1

## 2020-05-10 MED ORDER — BACITRACIN ZINC 500 UNIT/GM EX OINT
TOPICAL_OINTMENT | CUTANEOUS | Status: AC
  Filled 2020-05-10: qty 28.35

## 2020-05-10 MED ORDER — ADULT MULTIVITAMIN W/MINERALS CH
1.0000 | ORAL_TABLET | Freq: Every day | ORAL | Status: DC
  Administered 2020-05-11 – 2020-05-14 (×3): 1 via ORAL
  Filled 2020-05-10 (×3): qty 1

## 2020-05-10 MED ORDER — FENTANYL CITRATE (PF) 250 MCG/5ML IJ SOLN
INTRAMUSCULAR | Status: AC
  Filled 2020-05-10: qty 5

## 2020-05-10 MED ORDER — CHLORHEXIDINE GLUCONATE 0.12 % MT SOLN
15.0000 mL | Freq: Once | OROMUCOSAL | Status: AC
  Administered 2020-05-10: 15 mL via OROMUCOSAL
  Filled 2020-05-10: qty 15

## 2020-05-10 MED ORDER — VANCOMYCIN HCL 1000 MG IV SOLR
INTRAVENOUS | Status: DC | PRN
  Administered 2020-05-10: 1000 mg

## 2020-05-10 MED ORDER — POTASSIUM CHLORIDE 10 MEQ/100ML IV SOLN
10.0000 meq | INTRAVENOUS | Status: AC
  Administered 2020-05-10 (×2): 10 meq via INTRAVENOUS
  Filled 2020-05-10 (×2): qty 100

## 2020-05-10 MED ORDER — MIDAZOLAM HCL 2 MG/2ML IJ SOLN
INTRAMUSCULAR | Status: AC
  Filled 2020-05-10: qty 2

## 2020-05-10 MED ORDER — GLUCERNA SHAKE PO LIQD
237.0000 mL | Freq: Three times a day (TID) | ORAL | Status: DC
  Administered 2020-05-11 – 2020-05-12 (×4): 237 mL via ORAL
  Filled 2020-05-10: qty 237

## 2020-05-10 MED ORDER — HYDROCODONE-ACETAMINOPHEN 5-325 MG PO TABS
1.0000 | ORAL_TABLET | Freq: Four times a day (QID) | ORAL | Status: DC | PRN
  Administered 2020-05-10 – 2020-05-11 (×3): 2 via ORAL
  Filled 2020-05-10 (×3): qty 2

## 2020-05-10 MED ORDER — FENTANYL CITRATE (PF) 100 MCG/2ML IJ SOLN: 25.0000 ug | INTRAMUSCULAR | Status: DC | PRN

## 2020-05-10 MED ORDER — LIDOCAINE 2% (20 MG/ML) 5 ML SYRINGE
INTRAMUSCULAR | Status: DC | PRN
  Administered 2020-05-10: 40 mg via INTRAVENOUS

## 2020-05-10 MED ORDER — PHENYLEPHRINE HCL-NACL 10-0.9 MG/250ML-% IV SOLN
INTRAVENOUS | Status: DC | PRN
  Administered 2020-05-10: 25 ug/min via INTRAVENOUS

## 2020-05-10 MED ORDER — FENTANYL CITRATE (PF) 100 MCG/2ML IJ SOLN
50.0000 ug | Freq: Once | INTRAMUSCULAR | Status: AC
  Filled 2020-05-10: qty 1

## 2020-05-10 MED ORDER — ALBUMIN HUMAN 5 % IV SOLN: INTRAVENOUS | Status: DC | PRN

## 2020-05-10 MED ORDER — ROCURONIUM BROMIDE 10 MG/ML (PF) SYRINGE
PREFILLED_SYRINGE | INTRAVENOUS | Status: DC | PRN
  Administered 2020-05-10: 40 mg via INTRAVENOUS

## 2020-05-10 MED ORDER — LACTATED RINGERS IV SOLN: INTRAVENOUS | Status: DC

## 2020-05-10 MED ORDER — VANCOMYCIN HCL 1000 MG IV SOLR
INTRAVENOUS | Status: AC
  Filled 2020-05-10: qty 1000

## 2020-05-10 MED ORDER — PROPOFOL 10 MG/ML IV BOLUS
INTRAVENOUS | Status: AC
  Filled 2020-05-10: qty 20

## 2020-05-10 MED ORDER — ROPIVACAINE HCL 5 MG/ML IJ SOLN
INTRAMUSCULAR | Status: DC | PRN
  Administered 2020-05-10: 20 mL via PERINEURAL

## 2020-05-10 MED ORDER — FENTANYL CITRATE (PF) 250 MCG/5ML IJ SOLN
INTRAMUSCULAR | Status: DC | PRN
  Administered 2020-05-10 (×2): 50 ug via INTRAVENOUS

## 2020-05-10 MED ORDER — ONDANSETRON HCL 4 MG/2ML IJ SOLN
INTRAMUSCULAR | Status: DC | PRN
  Administered 2020-05-10: 4 mg via INTRAVENOUS

## 2020-05-10 MED ORDER — MUPIROCIN 2 % EX OINT
TOPICAL_OINTMENT | Freq: Two times a day (BID) | CUTANEOUS | Status: DC
  Administered 2020-05-10: 1 via NASAL
  Filled 2020-05-10 (×2): qty 22

## 2020-05-10 MED ORDER — DEXAMETHASONE SODIUM PHOSPHATE 10 MG/ML IJ SOLN
INTRAMUSCULAR | Status: DC | PRN
  Administered 2020-05-10: 5 mg via INTRAVENOUS

## 2020-05-10 MED ORDER — GABAPENTIN 100 MG PO CAPS
100.0000 mg | ORAL_CAPSULE | Freq: Three times a day (TID) | ORAL | Status: DC
  Administered 2020-05-10 – 2020-05-14 (×11): 100 mg via ORAL
  Filled 2020-05-10 (×11): qty 1

## 2020-05-10 MED ORDER — FENTANYL CITRATE (PF) 100 MCG/2ML IJ SOLN
INTRAMUSCULAR | Status: AC
  Administered 2020-05-10: 50 ug via INTRAVENOUS
  Filled 2020-05-10: qty 2

## 2020-05-10 MED ORDER — SODIUM CHLORIDE 0.9 % IV SOLN: INTRAVENOUS | Status: DC

## 2020-05-10 MED ORDER — ONDANSETRON HCL 4 MG/2ML IJ SOLN: 4.0000 mg | Freq: Four times a day (QID) | INTRAMUSCULAR | Status: DC | PRN

## 2020-05-10 MED ORDER — OXYCODONE HCL 5 MG/5ML PO SOLN
5.0000 mg | Freq: Once | ORAL | Status: DC | PRN
Start: 2020-05-10 — End: 2020-05-10

## 2020-05-10 MED ORDER — SUGAMMADEX SODIUM 200 MG/2ML IV SOLN
INTRAVENOUS | Status: DC | PRN
  Administered 2020-05-10: 200 mg via INTRAVENOUS

## 2020-05-10 MED ORDER — ACETAMINOPHEN 325 MG PO TABS: 325.0000 mg | ORAL_TABLET | Freq: Four times a day (QID) | ORAL | Status: DC | PRN

## 2020-05-10 SURGICAL SUPPLY — 78 items
ADH SKN CLS LQ APL DERMABOND (GAUZE/BANDAGES/DRESSINGS) ×1
APL PRP STRL LF DISP 70% ISPRP (MISCELLANEOUS) ×2
BIT DRILL 4.3 (BIT) ×2
BIT DRILL 4.3MM (BIT) ×1
BIT DRILL 4.3X300MM (BIT) IMPLANT
BIT DRILL LONG 3.3 (BIT) ×1 IMPLANT
BIT DRILL LONG 3.3MM (BIT) ×1
BIT DRILL QC 3.3X195 (BIT) ×2 IMPLANT
BNDG CMPR MED 10X6 ELC LF (GAUZE/BANDAGES/DRESSINGS) ×1
BNDG COHESIVE 6X5 TAN STRL LF (GAUZE/BANDAGES/DRESSINGS) ×3 IMPLANT
BNDG ELASTIC 4X5.8 VLCR STR LF (GAUZE/BANDAGES/DRESSINGS) ×2 IMPLANT
BNDG ELASTIC 6X10 VLCR STRL LF (GAUZE/BANDAGES/DRESSINGS) ×3 IMPLANT
BNDG ELASTIC 6X5.8 VLCR STR LF (GAUZE/BANDAGES/DRESSINGS) ×2 IMPLANT
CANISTER SUCT 3000ML PPV (MISCELLANEOUS) ×3 IMPLANT
CAP LOCK NCB (Cap) ×20 IMPLANT
CHLORAPREP W/TINT 26 (MISCELLANEOUS) ×5 IMPLANT
COVER SURGICAL LIGHT HANDLE (MISCELLANEOUS) ×3 IMPLANT
COVER WAND RF STERILE (DRAPES) ×3 IMPLANT
DERMABOND ADHESIVE PROPEN (GAUZE/BANDAGES/DRESSINGS) ×2
DERMABOND ADVANCED .7 DNX6 (GAUZE/BANDAGES/DRESSINGS) IMPLANT
DRAPE C-ARM 42X72 X-RAY (DRAPES) ×3 IMPLANT
DRAPE C-ARMOR (DRAPES) ×3 IMPLANT
DRAPE HALF SHEET 40X57 (DRAPES) ×6 IMPLANT
DRAPE ORTHO SPLIT 77X108 STRL (DRAPES) ×6
DRAPE SURG 17X23 STRL (DRAPES) ×3 IMPLANT
DRAPE SURG ORHT 6 SPLT 77X108 (DRAPES) ×2 IMPLANT
DRAPE U-SHAPE 47X51 STRL (DRAPES) ×3 IMPLANT
DRSG ADAPTIC 3X8 NADH LF (GAUZE/BANDAGES/DRESSINGS) IMPLANT
DRSG MEPILEX BORDER 4X12 (GAUZE/BANDAGES/DRESSINGS) IMPLANT
DRSG MEPILEX BORDER 4X4 (GAUZE/BANDAGES/DRESSINGS) IMPLANT
DRSG MEPILEX BORDER 4X8 (GAUZE/BANDAGES/DRESSINGS) ×4 IMPLANT
DRSG PAD ABDOMINAL 8X10 ST (GAUZE/BANDAGES/DRESSINGS) ×9 IMPLANT
ELECT REM PT RETURN 9FT ADLT (ELECTROSURGICAL) ×3
ELECTRODE REM PT RTRN 9FT ADLT (ELECTROSURGICAL) ×1 IMPLANT
GAUZE SPONGE 4X4 12PLY STRL (GAUZE/BANDAGES/DRESSINGS) ×3 IMPLANT
GLOVE BIO SURGEON STRL SZ 6.5 (GLOVE) ×6 IMPLANT
GLOVE BIO SURGEON STRL SZ7.5 (GLOVE) ×12 IMPLANT
GLOVE BIO SURGEONS STRL SZ 6.5 (GLOVE) ×3
GLOVE BIOGEL PI IND STRL 6.5 (GLOVE) ×1 IMPLANT
GLOVE BIOGEL PI IND STRL 7.5 (GLOVE) ×1 IMPLANT
GLOVE BIOGEL PI INDICATOR 6.5 (GLOVE) ×2
GLOVE BIOGEL PI INDICATOR 7.5 (GLOVE) ×2
GOWN STRL REUS W/ TWL LRG LVL3 (GOWN DISPOSABLE) ×3 IMPLANT
GOWN STRL REUS W/TWL LRG LVL3 (GOWN DISPOSABLE) ×9
K-WIRE 2.0 (WIRE) ×3
K-WIRE FXSTD 280X2XNS SS (WIRE) ×1
KIT BASIN OR (CUSTOM PROCEDURE TRAY) ×3 IMPLANT
KIT TURNOVER KIT B (KITS) ×3 IMPLANT
KWIRE FXSTD 280X2XNS SS (WIRE) IMPLANT
NS IRRIG 1000ML POUR BTL (IV SOLUTION) ×3 IMPLANT
PACK TOTAL JOINT (CUSTOM PROCEDURE TRAY) ×3 IMPLANT
PAD ARMBOARD 7.5X6 YLW CONV (MISCELLANEOUS) ×6 IMPLANT
PAD CAST 4YDX4 CTTN HI CHSV (CAST SUPPLIES) ×1 IMPLANT
PADDING CAST COTTON 4X4 STRL (CAST SUPPLIES) ×3
PADDING CAST COTTON 6X4 STRL (CAST SUPPLIES) ×3 IMPLANT
PLATE FEM DIST NCB PP 278MM (Plate) ×2 IMPLANT
SCREW 5.0 80MM (Screw) ×6 IMPLANT
SCREW CORTICAL NCB 5.0X65 (Screw) ×2 IMPLANT
SCREW NCB 3.5X75X5X6.2XST (Screw) IMPLANT
SCREW NCB 4.0X36MM (Screw) ×2 IMPLANT
SCREW NCB 5.0X36MM (Screw) ×2 IMPLANT
SCREW NCB 5.0X38 (Screw) ×2 IMPLANT
SCREW NCB 5.0X75MM (Screw) ×3 IMPLANT
SCREW NCB 5.0X85MM (Screw) ×2 IMPLANT
SPONGE LAP 18X18 RF (DISPOSABLE) IMPLANT
STAPLER VISISTAT 35W (STAPLE) ×3 IMPLANT
SUCTION FRAZIER HANDLE 10FR (MISCELLANEOUS) ×3
SUCTION TUBE FRAZIER 10FR DISP (MISCELLANEOUS) ×1 IMPLANT
SUT ETHILON 3 0 PS 1 (SUTURE) ×6 IMPLANT
SUT MNCRL AB 3-0 PS2 18 (SUTURE) ×2 IMPLANT
SUT VIC AB 0 CT1 27 (SUTURE)
SUT VIC AB 0 CT1 27XBRD ANBCTR (SUTURE) IMPLANT
SUT VIC AB 1 CT1 27 (SUTURE)
SUT VIC AB 1 CT1 27XBRD ANBCTR (SUTURE) IMPLANT
SUT VIC AB 2-0 CT1 27 (SUTURE) ×3
SUT VIC AB 2-0 CT1 TAPERPNT 27 (SUTURE) ×2 IMPLANT
TOWEL GREEN STERILE (TOWEL DISPOSABLE) ×6 IMPLANT
WATER STERILE IRR 1000ML POUR (IV SOLUTION) ×6 IMPLANT

## 2020-05-10 NOTE — Anesthesia Preprocedure Evaluation (Addendum)
Anesthesia Evaluation  Patient identified by MRN, date of birth, ID band Patient awake    Reviewed: Allergy & Precautions, H&P , NPO status , Patient's Chart, lab work & pertinent test results, reviewed documented beta blocker date and time   Airway Mallampati: II   Neck ROM: full    Dental   Pulmonary former smoker,    breath sounds clear to auscultation       Cardiovascular hypertension, Pt. on medications and Pt. on home beta blockers  Rhythm:regular Rate:Normal     Neuro/Psych negative neurological ROS  negative psych ROS   GI/Hepatic negative GI ROS, Neg liver ROS,   Endo/Other  diabetes, Type 2, Insulin Dependent  Renal/GU   negative genitourinary   Musculoskeletal  (+) Arthritis , Osteoarthritis,  Right distal femur fx   Abdominal   Peds  Hematology negative hematology ROS (+)   Anesthesia Other Findings   Reproductive/Obstetrics                            Anesthesia Physical Anesthesia Plan  ASA: III  Anesthesia Plan: General   Post-op Pain Management:    Induction: Intravenous  PONV Risk Score and Plan: 3  Airway Management Planned: Mask and Oral ETT  Additional Equipment: None  Intra-op Plan:   Post-operative Plan: Extubation in OR  Informed Consent: I have reviewed the patients History and Physical, chart, labs and discussed the procedure including the risks, benefits and alternatives for the proposed anesthesia with the patient or authorized representative who has indicated his/her understanding and acceptance.     Dental advisory given  Plan Discussed with: CRNA  Anesthesia Plan Comments: (Lab Results      Component                Value               Date                      WBC                      5.1                 05/10/2020                HGB                      9.4 (L)             05/10/2020                HCT                      26.8 (L)             05/10/2020                MCV                      84.0                05/10/2020                PLT                      251  05/10/2020           Lab Results      Component                Value               Date                      NA                       136                 05/10/2020                K                        3.4 (L)             05/10/2020                CO2                      25                  05/10/2020                GLUCOSE                  187 (H)             05/10/2020                BUN                      23                  05/10/2020                CREATININE               0.94                05/10/2020                CALCIUM                  8.7 (L)             05/10/2020                GFRNONAA                 >60                 05/10/2020                GFRAA                    70                  11/21/2019          )        Anesthesia Quick Evaluation

## 2020-05-10 NOTE — Consult Note (Signed)
Reason for Consult:Right distal femur fx Referring Physician: B Swinteck Time called: 0730 Time at bedside: 0903   Angela Chambers is an 79 y.o. female.  HPI: Angela Chambers was walking into her doctor's office and hurrying when she missed a step on the sidewalk and fell. She had immediate right thigh/knee pain and could not get up. She was brought to [facility] where x-rays showed a periprosthetic distal femur fx and orthopedic surgery was consulted. Due to the complexity of the fracture orthopedic trauma consultation was requested and she was transferred to [facility] for definitive care. She c/o localized pain to the knee. She lives with her husband and does not use assistive devices for ambulation.  Past Medical History:  Diagnosis Date  . Arthritis   . Cataract   . Diabetes mellitus   . Fibroids   . Glaucoma   . Hyperlipidemia   . Hypertension 07 /2013    WITH EF GREATER THAN 55 % WITH MILD MR BUT NO PROLASPE ,ESSENTIALLY NORMAL DONE TO EVALUATE PALPITATIONS .  . Obesity (BMI 30.0-34.9)   . Osteoporosis   . Palpitations    CONTROLLED ON BETA BLOCKER    Past Surgical History:  Procedure Laterality Date  . ABDOMINAL HYSTERECTOMY  10/18/ 1982   TAH-BSO for fibroids and adenomatous hyperplasia  . CARDIAC CATHETERIZATION  08/2009   NONOBSTRUCTIVE CAD WITH 20% IN THE LAD AND 20 % IN THE RCA NORMAL EF 65%  . CARDIAC CATHETERIZATION N/A 07/05/2015   Procedure: Left Heart Cath and Coronary Angiography;  Surgeon: David W Harding, MD;  Location: [facility];  Service: Cardiovascular: Angiographically minimal CAD. EF 60-65%  . CPET-MET Test (Cardiopulmonary Exercise Test)  03/06/2013   Adequate effort at 1.11. Peak VO2 12.3 is 86% in the normal range. Heart rate was just outside the normal range target being 127 beats a minute but this was 86% of projected. She did have an ischemic pattern after the anterograde threshold with increased heart rate and blunting of stroke volume during the last 3 minutes  of exercise, but was asymptomatic.  . JOINT REPLACEMENT  09/05/2009   L knee  . KNEE SURGERY  1984   per pt screws in R knee  . NM MYOVIEW LTD  06/2015   FALSE + GXT - Images Accurate:  TM - 6 min, 7 METs (93% MPHR) --> chest discomfort, notable ST depressions => Hight Risk Duke TM Score, but NO scintigraphic evidence of ischemia or infarction. EF 74$  . 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 TRANSTHORACIC ECHOCARDIOGRAM  July 2013   EF greater than 55%, mild MR no prolapse.  Normal  . TUBAL LIGATION      Family History  Problem Relation Age of Onset  . Diabetes Mother   . Heart disease Mother   . Hyperlipidemia Mother   . Diabetes Sister   . Hyperlipidemia Sister   . Hypertension Sister   . Diabetes Brother   . Hyperlipidemia Brother   . Prostate cancer Son        prostate  . Hyperlipidemia Sister     Social History:  reports that she quit smoking about 38 years ago. Her smoking use included cigarettes. She has a 10.00 pack-year smoking history. She has never used smokeless tobacco. She reports that she does not drink alcohol and does not use drugs.  Allergies:  Allergies    Allergen Reactions  . Lisinopril Cough  . Metformin And Related     Diarrhea on 107m BID, tolerates 500 mg BID    Medications: I have reviewed the patient's current medications.  Results for orders placed or performed during the hospital encounter of 05/09/20 (from the past 48 hour(s))  Basic metabolic panel     Status: Abnormal   Collection Time: 05/09/20  3:55 PM  Result Value Ref Range   Sodium 137 135 - 145 mmol/L   Potassium 4.2 3.5 - 5.1 mmol/L   Chloride 103 98 - 111 mmol/L   CO2 25 22 - 32 mmol/L   Glucose, Bld 199 (H) 70 - 99 mg/dL    Comment: Glucose reference range applies only to samples taken after fasting for at least 8 hours.   BUN 20 8 - 23  mg/dL   Creatinine, Ser 0.75 0.44 - 1.00 mg/dL   Calcium 9.1 8.9 - 10.3 mg/dL   GFR, Estimated >60 >60 mL/min    Comment: (NOTE) Calculated using the CKD-EPI Creatinine Equation (2021)    Anion gap 9 5 - 15    Comment: [facility]formed at [facility] [address] [place] [address] CBC with Differential     Status: Abnormal   Collection Time: 05/09/20  3:55 PM  Result Value Ref Range   WBC 8.3 4.0 - 10.5 K/uL   RBC 3.73 (L) 3.87 - 5.11 MIL/uL   Hemoglobin 11.2 (L) 12.0 - 15.0 g/dL   HCT 31.6 (L) 36.0 - 46.0 %   MCV 84.7 80.0 - 100.0 fL   MCH 30.0 26.0 - 34.0 pg   MCHC 35.4 30.0 - 36.0 g/dL   RDW 14.6 11.5 - 15.5 %   Platelets 319 150 - 400 K/uL   nRBC 0.0 0.0 - 0.2 %   Neutrophils Relative % 72 %   Neutro Abs 6.0 1.7 - 7.7 K/uL   Lymphocytes Relative 19 %   Lymphs Abs 1.6 0.7 - 4.0 K/uL   Monocytes Relative 7 %   Monocytes Absolute 0.6 0.1 - 1.0 K/uL   Eosinophils Relative 1 %   Eosinophils Absolute 0.1 0.0 - 0.5 K/uL   Basophils Relative 1 %   Basophils Absolute 0.0 0.0 - 0.1 K/uL   Immature Granulocytes 0 %   Abs Immature Granulocytes 0.03 0.00 - 0.07 K/uL    Comment: Performed at [facility] [address] [place] [id number] Resp Panel by RT-PCR (Flu A&B, Covid) Nasopharyngeal Swab     Status: None   Collection Time: 05/09/20  3:55 PM   Specimen: Nasopharyngeal Swab; Nasopharyngeal(NP) swabs in vial transport medium  Result Value Ref Range   SARS Coronavirus 2 by RT PCR NEGATIVE NEGATIVE    Comment: (NOTE) SARS-CoV-2 target nucleic acids are NOT DETECTED.  The SARS-CoV-2 RNA is generally detectable in upper respiratory specimens during the acute phase of infection. The lowest concentration of SARS-CoV-2 viral copies this assay can detect is 138 copies/mL. A negative result does not preclude SARS-Cov-2 infection and should not be used as the sole basis for treatment or other patient management decisions. A  negative result may occur with  improper specimen collection/handling, submission of specimen other than nasopharyngeal swab, presence of viral mutation(s) within the areas targeted by this assay, and inadequate number of viral copies(<138 copies/mL). A negative result must be combined with clinical observations, patient history, and epidemiological information. The expected result is Negative.  Fact Sheet for Patients:  https://www.fda.gov/media/152166/download  Fact Sheet for Healthcare Providers:  https://www.fda.gov/media/152162/download  This test is no t yet approved or cleared by the [place] FDA and  has been authorized for detection and/or diagnosis of SARS-CoV-2 by FDA under an Emergency Use Authorization (EUA). This EUA will remain  in effect (meaning this test can be used) for the duration of the COVID-19 declaration under Section 564(b)(1) of the Act, 21 U.S.C.section 360bbb-3(b)(1), unless the authorization is terminated  or revoked sooner.       Influenza A by PCR NEGATIVE NEGATIVE   Influenza B by PCR NEGATIVE NEGATIVE    Comment: (NOTE) The Xpert Xpress SARS-CoV-2/FLU/RSV plus assay is intended as an aid in the diagnosis of influenza from Nasopharyngeal swab specimens and should not be used as a sole basis for treatment. Nasal washings and aspirates are unacceptable for Xpert Xpress SARS-CoV-2/FLU/RSV testing.  Fact Sheet for Patients: https://www.fda.gov/media/152166/download  Fact Sheet for Healthcare Providers: https://www.fda.gov/media/152162/download  This test is not yet approved or cleared by the [place] FDA and has been authorized for detection and/or diagnosis of SARS-CoV-2 by FDA under an Emergency Use Authorization (EUA). This EUA will remain in effect (meaning this test can be used) for the duration of the COVID-19 declaration under Section 564(b)(1) of the Act, 21 U.S.C. section 360bbb-3(b)(1), unless the authorization is terminated  or revoked.  Performed at [facility], [address] [place], [place] [address]   Hemoglobin A1c     Status: Abnormal   Collection Time: 05/09/20  3:55 PM  Result Value Ref Range   Hgb A1c MFr Bld 6.4 (H) 4.8 - 5.6 %    Comment: (NOTE) Pre diabetes:          5.7%-6.4%  Diabetes:              >6.4%  Glycemic control for   <7.0% adults with diabetes    Mean Plasma Glucose 136.98 mg/dL    Comment: Performed at [facility], [address] [place], [place] [address]  CBC     Status: Abnormal   Collection Time: 05/09/20  9:31 PM  Result Value Ref Range   WBC 8.3 4.0 - 10.5 K/uL   RBC 3.78 (L) 3.87 - 5.11 MIL/uL   Hemoglobin 10.9 (L) 12.0 - 15.0 g/dL   HCT 31.5 (L) 36.0 - 46.0 %   MCV 83.3 80.0 - 100.0 fL   MCH 28.8 26.0 - 34.0 pg   MCHC 34.6 30.0 - 36.0 g/dL   RDW 14.6 11.5 - 15.5 %   Platelets 314 150 - 400 K/uL   nRBC 0.0 0.0 - 0.2 %    Comment: Performed at [facility], [address] [place], [place] [address]  Basic metabolic panel     Status: Abnormal   Collection Time: 05/09/20  9:31 PM  Result Value Ref Range   Sodium 139 135 - 145 mmol/L   Potassium 3.9 3.5 - 5.1 mmol/L   Chloride 102 98 - 111 mmol/L   CO2 25 22 - 32 mmol/L   Glucose, Bld 222 (H) 70 - 99 mg/dL    Comment: Glucose reference range applies only to samples taken after fasting for at least 8 hours.   BUN 24 (H) 8 - 23 mg/dL   Creatinine, Ser 1.09 (H) 0.44 - 1.00 mg/dL   Calcium 9.3 8.9 - 10.3 mg/dL   GFR, Estimated 52 (L) >60 mL/min    Comment: (NOTE) Calculated using the CKD-EPI Creatinine Equation (2021)      Anion gap 12 5 - 15    Comment: Performed at [facility], [address] [place], [place] [address]  Glucose, capillary     Status: Abnormal   Collection Time: 05/09/20  9:53 PM  Result Value Ref Range   Glucose-Capillary 198 (H) 70 - 99 mg/dL    Comment: Glucose reference range applies only to samples taken after fasting for at least 8 hours.   Surgical pcr screen     Status: Abnormal   Collection Time: 05/09/20 10:34 PM   Specimen: Nasal Mucosa; Nasal Swab  Result Value Ref Range   MRSA, PCR NEGATIVE NEGATIVE   Staphylococcus aureus POSITIVE (A) NEGATIVE    Comment: (NOTE) The Xpert SA Assay (FDA approved for NASAL specimens in patients 22 years of age and older), is one component of a comprehensive surveillance program. It is not intended to diagnose infection nor to guide or monitor treatment. Performed at  Hospital Lab, [address] [place], [place] [address]   Glucose, capillary     Status: Abnormal   Collection Time: 05/10/20  8:12 AM  Result Value Ref Range   Glucose-Capillary 170 (H) 70 - 99 mg/dL    Comment: Glucose reference range applies only to samples taken after fasting for at least 8 hours.    DG Chest 2 View  Result Date: 05/09/2020 CLINICAL DATA:  fall EXAM: CHEST - 2 VIEW COMPARISON:  09/19/2013 and prior. FINDINGS: No focal consolidation. No pneumothorax or pleural effusion. Cardiomediastinal silhouette is within normal limits. No acute osseous abnormality. IMPRESSION: No focal airspace disease. Electronically Signed   By: Chikanele  Emekauwa M.D.   On: 05/09/2020 15:00   DG Knee Complete 4 Views Right  Result Date: 05/09/2020 CLINICAL DATA:  Tripped and fell at doctor's office today, RIGHT knee and hip pain EXAM: RIGHT KNEE - COMPLETE 4+ VIEW COMPARISON:  05/24/2012 FINDINGS: Interval RIGHT total knee arthroplasty. Diffuse osseous demineralization. Comminuted oblique fracture of distal RIGHT femoral metaphysis, displaced and apex anteriorly angulated, extending into femoral component of knee prosthesis. No dislocation. Visualized tibia and fibula appear intact. IMPRESSION: Prior RIGHT total knee arthroplasty. Osseous demineralization with comminuted displaced and angulated distal RIGHT femoral metaphyseal fracture extending to knee prosthesis. Electronically Signed   By: Mark  Boles M.D.   On:  05/09/2020 13:16   DG Hand Complete Right  Result Date: 05/09/2020 CLINICAL DATA:  Tripped and fell at doctor's office today, RIGHT knee and hip pain, abrasion RIGHT hand EXAM: RIGHT HAND - COMPLETE 3+ VIEW COMPARISON:  None FINDINGS: Osseous demineralization. Degenerative changes at first CMC joint, minimally at first MCP joint. Slight widening of scapholunate interval question torn scapholunate ligament. 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 Scattered narrowing of IP joints. No acute fracture, dislocation, or bone destruction. Question small juxta-articular erosion at fourth MCP joint. IMPRESSION: Scattered degenerative changes as above. Questionable tiny erosion at the fourth MCP joint Question torn scapholunate ligament. Electronically Signed   By: Mark  Boles M.D.   On: 05/09/2020 13:15   DG Hip Unilat W or Wo Pelvis 2-3 Views Right  Result Date: 05/09/2020 CLINICAL DATA:  Tripped and fell at doctor's office today, RIGHT hip and knee pain EXAM: DG HIP (WITH OR WITHOUT PELVIS) 2-3V RIGHT COMPARISON:  None FINDINGS: Osseous demineralization. Narrowing of the hip joints bilaterally. SI joints unremarkable. No acute fracture, dislocation, or bone destruction. IMPRESSION: No acute osseous abnormalities. Osseous demineralization with mild degenerative changes of the hip joints. Please refer to RIGHT knee radiographic exam for description   of distal RIGHT femoral fracture. Electronically Signed   By: Lavonia Dana M.D.   On: 05/09/2020 13:17    Review of Systems  HENT: Negative for ear discharge, ear pain, hearing loss and tinnitus.   Eyes: Negative for photophobia and pain.  Respiratory: Negative for cough and shortness of breath.   Cardiovascular: Negative for chest pain.  Gastrointestinal: Negative for abdominal pain, nausea and vomiting.  Genitourinary: Negative for dysuria, flank pain, frequency and urgency.  Musculoskeletal: Positive for arthralgias (Right knee). Negative for back pain, myalgias  and neck pain.  Neurological: Negative for dizziness and headaches.  Hematological: Does not bruise/bleed easily.  Psychiatric/Behavioral: The patient is not nervous/anxious.    Blood pressure (!) 104/57, pulse 72, temperature 98 F (36.7 C), temperature source Oral, resp. rate 16, weight 86.2 kg, SpO2 100 %. Physical Exam Constitutional:      General: She is not in acute distress.    Appearance: She is well-developed and well-nourished. She is not diaphoretic.  HENT:     Head: Normocephalic and atraumatic.  Eyes:     General: No scleral icterus.       Right eye: No discharge.        Left eye: No discharge.     Conjunctiva/sclera: Conjunctivae normal.  Cardiovascular:     Rate and Rhythm: Normal rate and regular rhythm.  Pulmonary:     Effort: Pulmonary effort is normal. No respiratory distress.  Musculoskeletal:     Cervical back: Normal range of motion.     Comments: RLE No traumatic wounds, ecchymosis, or rash  TTP knee  No ankle effusion  Sens DPN, SPN, TN intact  Motor EHL, ext, flex, evers 5/5  DP 2+, PT 0, No significant edema  Skin:    General: Skin is warm and dry.  Neurological:     Mental Status: She is alert.  Psychiatric:        Mood and Affect: Mood and affect normal.        Behavior: Behavior normal.     Assessment/Plan: Right distal femur fx -- Plan ORIF today by Dr. Doreatha Martin. Please keep NPO. Multiple medical problems including hypertension, diabetes, hyperlipidemia, and osteoporosis -- per primary service    Lisette Abu, PA-C Orthopedic Surgery [phone] 05/10/2020, 9:10 AM

## 2020-05-10 NOTE — Plan of Care (Signed)
Dressing change due to dressing soiled. Removed compression wrap and replaced with dry clean compression wrap. Heels elevated off of bed along with ice applied. Pain tolerated well with PO meds.   Problem: Education: Goal: Knowledge of General Education information will improve Description: Including pain rating scale, medication(s)/side effects and non-pharmacologic comfort measures Outcome: Progressing   Problem: Activity: Goal: Risk for activity intolerance will decrease Outcome: Progressing   Problem: Pain Managment: Goal: General experience of comfort will improve Outcome: Progressing   Problem: Safety: Goal: Ability to remain free from injury will improve Outcome: Progressing   Problem: Skin Integrity: Goal: Risk for impaired skin integrity will decrease Outcome: Progressing

## 2020-05-10 NOTE — H&P (View-Only) (Signed)
Reason for Consult:Right distal femur fx Referring Physician: B Swinteck Time called: 0730 Time at bedside: Tamms is an 79 y.o. female.  HPI: Angela Chambers was walking into her doctor's office and hurrying when she missed a step on the sidewalk and fell. She had immediate right thigh/knee pain and could not get up. She was brought to [facility] where x-rays showed a periprosthetic distal femur fx and orthopedic surgery was consulted. Due to the complexity of the fracture orthopedic trauma consultation was requested and she was transferred to [facility] for definitive care. She c/o localized pain to the knee. She lives with her husband and does not use assistive devices for ambulation.  Past Medical History:  Diagnosis Date  . Arthritis   . Cataract   . Diabetes mellitus   . Fibroids   . Glaucoma   . Hyperlipidemia   . Hypertension 07 /2013    WITH EF GREATER THAN 55 % WITH MILD MR BUT NO PROLASPE ,ESSENTIALLY NORMAL DONE TO EVALUATE PALPITATIONS .  Marland Kitchen Obesity (BMI 30.0-34.9)   . Osteoporosis   . Palpitations    CONTROLLED ON BETA BLOCKER    Past Surgical History:  Procedure Laterality Date  . ABDOMINAL HYSTERECTOMY  10/18/ 1982   TAH-BSO for fibroids and adenomatous hyperplasia  . CARDIAC CATHETERIZATION  08/2009   NONOBSTRUCTIVE CAD WITH 20% IN THE LAD AND 20 % IN THE RCA NORMAL EF 65%  . CARDIAC CATHETERIZATION N/A 07/05/2015   Procedure: Left Heart Cath and Coronary Angiography;  Surgeon: Leonie Man, MD;  Location: [facility];  Service: Cardiovascular: Angiographically minimal CAD. EF 60-65%  . CPET-MET Test (Cardiopulmonary Exercise Test)  03/06/2013   Adequate effort at 1.11. Peak VO2 12.3 is 86% in the normal range. Heart rate was just outside the normal range target being 127 beats a minute but this was 86% of projected. She did have an ischemic pattern after the anterograde threshold with increased heart rate and blunting of stroke volume during the last 3 minutes  of exercise, but was asymptomatic.  Marland Kitchen JOINT REPLACEMENT  09/05/2009   L knee  . KNEE SURGERY  1984   per pt screws in R knee  . NM MYOVIEW LTD  06/2015   FALSE + GXT - Images Accurate:  TM - 6 min, 7 METs (93% MPHR) --> chest discomfort, notable ST depressions => Hight Risk Duke TM Score, but NO scintigraphic evidence of ischemia or infarction. EF 74$  . 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 TRANSTHORACIC ECHOCARDIOGRAM  July 2013   EF greater than 55%, mild MR no prolapse.  Normal  . TUBAL LIGATION      Family History  Problem Relation Age of Onset  . Diabetes Mother   . Heart disease Mother   . Hyperlipidemia Mother   . Diabetes Sister   . Hyperlipidemia Sister   . Hypertension Sister   . Diabetes Brother   . Hyperlipidemia Brother   . Prostate cancer Son        prostate  . Hyperlipidemia Sister     Social History:  reports that she quit smoking about 38 years ago. Her smoking use included cigarettes. She has a 10.00 pack-year smoking history. She has never used smokeless tobacco. She reports that she does not drink alcohol and does not use drugs.  Allergies:  Allergies  Allergen Reactions  . Lisinopril Cough  . Metformin And Related     Diarrhea on 107m BID, tolerates 500 mg BID    Medications: I have reviewed the patient's current medications.  Results for orders placed or performed during the hospital encounter of 05/09/20 (from the past 48 hour(s))  Basic metabolic panel     Status: Abnormal   Collection Time: 05/09/20  3:55 PM  Result Value Ref Range   Sodium 137 135 - 145 mmol/L   Potassium 4.2 3.5 - 5.1 mmol/L   Chloride 103 98 - 111 mmol/L   CO2 25 22 - 32 mmol/L   Glucose, Bld 199 (H) 70 - 99 mg/dL    Comment: Glucose reference range applies only to samples taken after fasting for at least 8 hours.   BUN 20 8 - 23  mg/dL   Creatinine, Ser 0.75 0.44 - 1.00 mg/dL   Calcium 9.1 8.9 - 10.3 mg/dL   GFR, Estimated >60 >60 mL/min    Comment: (NOTE) Calculated using the CKD-EPI Creatinine Equation (2021)    Anion gap 9 5 - 15    Comment: [facility]formed at [facility] [address] [place] [address] CBC with Differential     Status: Abnormal   Collection Time: 05/09/20  3:55 PM  Result Value Ref Range   WBC 8.3 4.0 - 10.5 K/uL   RBC 3.73 (L) 3.87 - 5.11 MIL/uL   Hemoglobin 11.2 (L) 12.0 - 15.0 g/dL   HCT 31.6 (L) 36.0 - 46.0 %   MCV 84.7 80.0 - 100.0 fL   MCH 30.0 26.0 - 34.0 pg   MCHC 35.4 30.0 - 36.0 g/dL   RDW 14.6 11.5 - 15.5 %   Platelets 319 150 - 400 K/uL   nRBC 0.0 0.0 - 0.2 %   Neutrophils Relative % 72 %   Neutro Abs 6.0 1.7 - 7.7 K/uL   Lymphocytes Relative 19 %   Lymphs Abs 1.6 0.7 - 4.0 K/uL   Monocytes Relative 7 %   Monocytes Absolute 0.6 0.1 - 1.0 K/uL   Eosinophils Relative 1 %   Eosinophils Absolute 0.1 0.0 - 0.5 K/uL   Basophils Relative 1 %   Basophils Absolute 0.0 0.0 - 0.1 K/uL   Immature Granulocytes 0 %   Abs Immature Granulocytes 0.03 0.00 - 0.07 K/uL    Comment: Performed at [facility] [address] [place] [id number] Resp Panel by RT-PCR (Flu A&B, Covid) Nasopharyngeal Swab     Status: None   Collection Time: 05/09/20  3:55 PM   Specimen: Nasopharyngeal Swab; Nasopharyngeal(NP) swabs in vial transport medium  Result Value Ref Range   SARS Coronavirus 2 by RT PCR NEGATIVE NEGATIVE    Comment: (NOTE) SARS-CoV-2 target nucleic acids are NOT DETECTED.  The SARS-CoV-2 RNA is generally detectable in upper respiratory specimens during the acute phase of infection. The lowest concentration of SARS-CoV-2 viral copies this assay can detect is 138 copies/mL. A negative result does not preclude SARS-Cov-2 infection and should not be used as the sole basis for treatment or other patient management decisions. A  negative result may occur with  improper specimen collection/handling, submission of specimen other than nasopharyngeal swab, presence of viral mutation(s) within the areas targeted by this assay, and inadequate number of viral copies(<138 copies/mL). A negative result must be combined with clinical observations, patient history, and epidemiological information. The expected result is Negative.  Fact Sheet for Patients:  EntrepreneurPulse.com.au  Fact Sheet for Healthcare Providers:  IncredibleEmployment.be  This test is no t yet approved or cleared by the [place] FDA and  has been authorized for detection and/or diagnosis of SARS-CoV-2 by FDA under an Emergency Use Authorization (EUA). This EUA will remain  in effect (meaning this test can be used) for the duration of the COVID-19 declaration under Section 564(b)(1) of the Act, 21 U.S.C.section 360bbb-3(b)(1), unless the authorization is terminated  or revoked sooner.       Influenza A by PCR NEGATIVE NEGATIVE   Influenza B by PCR NEGATIVE NEGATIVE    Comment: (NOTE) The Xpert Xpress SARS-CoV-2/FLU/RSV plus assay is intended as an aid in the diagnosis of influenza from Nasopharyngeal swab specimens and should not be used as a sole basis for treatment. Nasal washings and aspirates are unacceptable for Xpert Xpress SARS-CoV-2/FLU/RSV testing.  Fact Sheet for Patients: EntrepreneurPulse.com.au  Fact Sheet for Healthcare Providers: IncredibleEmployment.be  This test is not yet approved or cleared by the [place] FDA and has been authorized for detection and/or diagnosis of SARS-CoV-2 by FDA under an Emergency Use Authorization (EUA). This EUA will remain in effect (meaning this test can be used) for the duration of the COVID-19 declaration under Section 564(b)(1) of the Act, 21 U.S.C. section 360bbb-3(b)(1), unless the authorization is terminated  or revoked.  Performed at [facility], [place] [address], [place], [place] [address]   Hemoglobin A1c     Status: Abnormal   Collection Time: 05/09/20  3:55 PM  Result Value Ref Range   Hgb A1c MFr Bld 6.4 (H) 4.8 - 5.6 %    Comment: (NOTE) Pre diabetes:          5.7%-6.4%  Diabetes:              >6.4%  Glycemic control for   <7.0% adults with diabetes    Mean Plasma Glucose 136.98 mg/dL    Comment: Performed at [facility] [address], [place], [place] [address]  CBC     Status: Abnormal   Collection Time: 05/09/20  9:31 PM  Result Value Ref Range   WBC 8.3 4.0 - 10.5 K/uL   RBC 3.78 (L) 3.87 - 5.11 MIL/uL   Hemoglobin 10.9 (L) 12.0 - 15.0 g/dL   HCT 31.5 (L) 36.0 - 46.0 %   MCV 83.3 80.0 - 100.0 fL   MCH 28.8 26.0 - 34.0 pg   MCHC 34.6 30.0 - 36.0 g/dL   RDW 14.6 11.5 - 15.5 %   Platelets 314 150 - 400 K/uL   nRBC 0.0 0.0 - 0.2 %    Comment: Performed at [facility], [place] [address], [place], [place] [address]  Basic metabolic panel     Status: Abnormal   Collection Time: 05/09/20  9:31 PM  Result Value Ref Range   Sodium 139 135 - 145 mmol/L   Potassium 3.9 3.5 - 5.1 mmol/L   Chloride 102 98 - 111 mmol/L   CO2 25 22 - 32 mmol/L   Glucose, Bld 222 (H) 70 - 99 mg/dL    Comment: Glucose reference range applies only to samples taken after fasting for at least 8 hours.   BUN 24 (H) 8 - 23 mg/dL   Creatinine, Ser 1.09 (H) 0.44 - 1.00 mg/dL   Calcium 9.3 8.9 - 10.3 mg/dL   GFR, Estimated 52 (L) >60 mL/min    Comment: (NOTE) Calculated using the CKD-EPI Creatinine Equation (2021)  Anion gap 12 5 - 15    Comment: Performed at [facility] [address], [place], [place] [address]  Glucose, capillary     Status: Abnormal   Collection Time: 05/09/20  9:53 PM  Result Value Ref Range   Glucose-Capillary 198 (H) 70 - 99 mg/dL    Comment: Glucose reference range applies only to samples taken after fasting for at least 8 hours.   Surgical pcr screen     Status: Abnormal   Collection Time: 05/09/20 10:34 PM   Specimen: Nasal Mucosa; Nasal Swab  Result Value Ref Range   MRSA, PCR NEGATIVE NEGATIVE   Staphylococcus aureus POSITIVE (A) NEGATIVE    Comment: (NOTE) The Xpert SA Assay (FDA approved for NASAL specimens in patients 25 years of age and older), is one component of a comprehensive surveillance program. It is not intended to diagnose infection nor to guide or monitor treatment. Performed at [facility], [place] [address] [place], [place] [address]   Glucose, capillary     Status: Abnormal   Collection Time: 05/10/20  8:12 AM  Result Value Ref Range   Glucose-Capillary 170 (H) 70 - 99 mg/dL    Comment: Glucose reference range applies only to samples taken after fasting for at least 8 hours.    DG Chest 2 View  Result Date: 05/09/2020 CLINICAL DATA:  fall EXAM: CHEST - 2 VIEW COMPARISON:  09/19/2013 and prior. FINDINGS: No focal consolidation. No pneumothorax or pleural effusion. Cardiomediastinal silhouette is within normal limits. No acute osseous abnormality. IMPRESSION: No focal airspace disease. Electronically Signed   By: Primitivo Gauze M.D.   On: 05/09/2020 15:00   DG Knee Complete 4 Views Right  Result Date: 05/09/2020 CLINICAL DATA:  Tripped and fell at [place] office today, RIGHT knee and hip pain EXAM: RIGHT KNEE - COMPLETE 4+ VIEW COMPARISON:  05/24/2012 FINDINGS: Interval RIGHT total knee arthroplasty. Diffuse osseous demineralization. Comminuted oblique fracture of distal RIGHT femoral metaphysis, displaced and apex anteriorly angulated, extending into femoral component of knee prosthesis. No dislocation. Visualized tibia and fibula appear intact. IMPRESSION: Prior RIGHT total knee arthroplasty. Osseous demineralization with comminuted displaced and angulated distal RIGHT femoral metaphyseal fracture extending to knee prosthesis. Electronically Signed   By: Lavonia Dana M.D.   On:  05/09/2020 13:16   DG Hand Complete Right  Result Date: 05/09/2020 CLINICAL DATA:  Tripped and fell at [place] office today, RIGHT knee and hip pain, abrasion RIGHT hand EXAM: RIGHT HAND - COMPLETE 3+ VIEW COMPARISON:  None FINDINGS: Osseous demineralization. Degenerative changes at first [facility] joint, minimally at first MCP joint. Slight widening of scapholunate interval question torn scapholunate ligament. 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 Scattered narrowing of IP joints. No acute fracture, dislocation, or bone destruction. Question small juxta-articular erosion at fourth MCP joint. IMPRESSION: Scattered degenerative changes as above. Questionable tiny erosion at the fourth MCP joint Question torn scapholunate ligament. Electronically Signed   By: Lavonia Dana M.D.   On: 05/09/2020 13:15   DG Hip Unilat W or Wo Pelvis 2-3 Views Right  Result Date: 05/09/2020 CLINICAL DATA:  Tripped and fell at [place] office today, RIGHT hip and knee pain EXAM: DG HIP (WITH OR WITHOUT PELVIS) 2-3V RIGHT COMPARISON:  None FINDINGS: Osseous demineralization. Narrowing of the hip joints bilaterally. SI joints unremarkable. No acute fracture, dislocation, or bone destruction. IMPRESSION: No acute osseous abnormalities. Osseous demineralization with mild degenerative changes of the hip joints. Please refer to RIGHT knee radiographic exam for description  of distal RIGHT femoral fracture. Electronically Signed   By: Lavonia Dana M.D.   On: 05/09/2020 13:17    Review of Systems  HENT: Negative for ear discharge, ear pain, hearing loss and tinnitus.   Eyes: Negative for photophobia and pain.  Respiratory: Negative for cough and shortness of breath.   Cardiovascular: Negative for chest pain.  Gastrointestinal: Negative for abdominal pain, nausea and vomiting.  Genitourinary: Negative for dysuria, flank pain, frequency and urgency.  Musculoskeletal: Positive for arthralgias (Right knee). Negative for back pain, myalgias  and neck pain.  Neurological: Negative for dizziness and headaches.  Hematological: Does not bruise/bleed easily.  Psychiatric/Behavioral: The patient is not nervous/anxious.    Blood pressure (!) 104/57, pulse 72, temperature 98 F (36.7 C), temperature source Oral, resp. rate 16, weight 86.2 kg, SpO2 100 %. Physical Exam Constitutional:      General: She is not in acute distress.    Appearance: She is well-developed and well-nourished. She is not diaphoretic.  HENT:     Head: Normocephalic and atraumatic.  Eyes:     General: No scleral icterus.       Right eye: No discharge.        Left eye: No discharge.     Conjunctiva/sclera: Conjunctivae normal.  Cardiovascular:     Rate and Rhythm: Normal rate and regular rhythm.  Pulmonary:     Effort: Pulmonary effort is normal. No respiratory distress.  Musculoskeletal:     Cervical back: Normal range of motion.     Comments: RLE No traumatic wounds, ecchymosis, or rash  TTP knee  No ankle effusion  Sens DPN, SPN, TN intact  Motor EHL, ext, flex, evers 5/5  DP 2+, PT 0, No significant edema  Skin:    General: Skin is warm and dry.  Neurological:     Mental Status: She is alert.  Psychiatric:        Mood and Affect: Mood and affect normal.        Behavior: Behavior normal.     Assessment/Plan: Right distal femur fx -- Plan ORIF today by Dr. Doreatha Martin. Please keep NPO. Multiple medical problems including hypertension, diabetes, hyperlipidemia, and osteoporosis -- per primary service    Lisette Abu, PA-C Orthopedic Surgery [phone] 05/10/2020, 9:10 AM

## 2020-05-10 NOTE — Anesthesia Procedure Notes (Signed)
Procedure Name: Intubation Date/Time: 05/10/2020 1:20 PM Performed by: Clearnce Sorrel, CRNA Pre-anesthesia Checklist: Patient identified, Emergency Drugs available, Suction available, Patient being monitored and Timeout performed Patient Re-evaluated:Patient Re-evaluated prior to induction Oxygen Delivery Method: Circle system utilized Preoxygenation: Pre-oxygenation with 100% oxygen Induction Type: IV induction Ventilation: Mask ventilation without difficulty and Oral airway inserted - appropriate to patient size Laryngoscope Size: Mac and 3 Grade View: Grade I Tube type: Oral Tube size: 7.0 mm Number of attempts: 1 Airway Equipment and Method: Stylet Placement Confirmation: ETT inserted through vocal cords under direct vision,  positive ETCO2 and breath sounds checked- equal and bilateral Secured at: 22 cm Tube secured with: Tape Dental Injury: Teeth and Oropharynx as per pre-operative assessment

## 2020-05-10 NOTE — Progress Notes (Signed)
Report given to [place], Therapist, sports.  The patient is ready for the Short Stay.

## 2020-05-10 NOTE — Transfer of Care (Signed)
Immediate Anesthesia Transfer of Care Note  Patient: Angela Chambers  Procedure(s) Performed: OPEN REDUCTION INTERNAL FIXATION (ORIF) DISTAL FEMUR FRACTURE (Right Leg Upper)  Patient Location: PACU  Anesthesia Type:General and Regional  Level of Consciousness: awake, alert  and oriented  Airway & Oxygen Therapy: Patient Spontanous Breathing  Post-op Assessment: Report given to RN and Post -op Vital signs reviewed and stable  Post vital signs: Reviewed and stable  Last Vitals:  Vitals Value Taken Time  BP 128/57 05/10/20 1436  Temp    Pulse 84 05/10/20 1438  Resp 20 05/10/20 1438  SpO2 96 % 05/10/20 1438  Vitals shown include unvalidated device data.  Last Pain:  Vitals:   05/10/20 0916  TempSrc: Oral  PainSc:       Patients Stated Pain Goal: 3 (02/77/41 2878)  Complications: No complications documented.

## 2020-05-10 NOTE — Op Note (Signed)
Orthopaedic Surgery Operative Note (CSN: [id number] ) Date of Surgery: 05/10/2020  Admit Date: 05/09/2020   Diagnoses: Pre-Op Diagnoses: Right periprosthetic distal femur fracture   Post-Op Diagnosis: Same  Procedures: CPT 27511-Open reduction internal fixation of right distal femur fracture  Surgeons : Primary: Shona Needles, MD  Assistant: Patrecia Pace, PA-C  Location: OR 3   Anesthesia:General  Antibiotics: Ancef 2g preop with 1 gm vancomycin powder placed topically   Tourniquet time:None  Estimated Blood MHDQ:22 mL  Complications:None   Specimens:None   Implants: Implant Name Type Inv. Item Serial No. Manufacturer Lot No. LRB No. Used Action  PLATE FEM DIST NCB PP 278MM - [id number] Plate PLATE FEM DIST NCB PP 278MM  [employer](ORTH,TRAU,BIO,SG)  Right 1 Implanted  SCREW 5.0 80MM - [id number] Screw SCREW 5.0 80MM  [employer](ORTH,TRAU,BIO,SG)  Right 3 Implanted  SCREW NCB 5.0X75MM - [id number] Screw SCREW NCB 5.0X75MM  [employer](ORTH,TRAU,BIO,SG)  Right 1 Implanted  SCREW NCB 5.0X36MM - [id number] Screw SCREW NCB 5.0X36MM  [employer](ORTH,TRAU,BIO,SG)  Right 1 Implanted  SCREW NCB 5.0X38 - [id number] Screw SCREW NCB 5.0X38  [employer](ORTH,TRAU,BIO,SG)  Right 1 Implanted  SCREW NCB 5.0X85MM - [id number] Screw SCREW NCB 5.0X85MM  [employer](ORTH,TRAU,BIO,SG)  Right 1 Implanted  SCREW CORTICAL NCB 5.0X65 - [id number] Screw SCREW CORTICAL NCB 5.0X65  [employer](ORTH,TRAU,BIO,SG)  Right 1 Implanted  SCREW NCB 4.0X36MM - [id number] Screw SCREW NCB 4.0X36MM  [employer](ORTH,TRAU,BIO,SG)  Right 1 Implanted  CAP LOCK NCB - [id number] Cap CAP LOCK NCB  [employer](ORTH,TRAU,BIO,SG)  Right 10 Implanted     Indications for Surgery: 79 year old female who sustained a ground-level fall and has a right periprosthetic distal femur fracture. Due to the unstable nature of her injury I recommended proceeding with open reduction internal fixation. Risks and benefits were  discussed with the patient. Risks included but not limited to bleeding, infection, malunion, nonunion, hardware failure, hardware irritation, nerve and blood vessel injury, knee stiffness, DVT, even the possibility anesthetic complications. The patient agreed to proceed with surgery and consent was obtained.  Operative Findings: Open reduction internal fixation of right periprosthetic distal femur fracture using [employer] NCB 12 hole distal femoral locking plate  Procedure: The patient was identified in the preoperative holding area. Consent was confirmed with the patient and their family and all questions were answered. The operative extremity was marked after confirmation with the patient. she was then brought back to the operating room by our anesthesia colleagues. She was carefully transferred over to a radiolucent flat top table. She was placed under general anesthetic. A bump was placed under her operative hip. The right lower extremity was then prepped and draped in usual sterile fashion. A timeout was performed to verify the patient, the procedure, and the extremity. Preoperative antibiotics were dosed.  Fluoroscopic imaging was obtained to show the unstable nature of the injury. The hip and knee were flexed over a triangle to obtain adequate reduction. A lateral approach the distal femur was carried down through skin subcutaneous tissue. I split the IT band in line with my incision. I then developed an interval between the vastus lateralis and the lateral cortex of the femur. I exposed the distal aspect of the femur. I then attached a 12 hole [employer] NCB distal femoral locking plate and slid this submuscularly along the lateral cortex of the femur. I aligned it appropriately on the distal aspect of the femur and held it provisionally with a 2.0 mm K wire. I then performed a reduction  maneuver and percutaneously placed a 3.3 mm drill bit in the most proximal hole of the plate.  I returned  to the distal segment and placed 5.0 millimeter screws to bring the distal portion of plate flush to bone. 2 screws were placed. I then used the targeting arm to place percutaneous 5.0 millimeter screws in the femoral shaft to correct the coronal alignment. The 3.3 mm drill bit was exchanged for 4.0 millimeter screw. Locking caps were placed on the 5.0 millimeter screws and the targeting arm was removed. I then proceeded to place 4 more 5.0 millimeter screws in the distal segment. Locking caps were placed on all of the distal screws. Final fluoroscopic imaging was obtained. The incision was copiously irrigated. A gram of vancomycin powder was placed into the incision. A layer closure of 0 Vicryl for the IT band and 2-0 Vicryl and 3-0 Monocryl with Dermabond for the skin. Sterile dressing was placed. The patient was awoken from anesthesia and taken to the PACU in stable condition.  Post Op Plan/Instructions: The patient may be weightbearing as tolerated to the right lower extremity. She'll receive postoperative Ancef. She'll receive Lovenox for DVT prophylaxis. We'll have her mobilize with physical and Occupational Therapy.  I was present and performed the entire surgery.  Patrecia Pace, PA-C did assist me throughout the case. An assistant was necessary given the difficulty in approach, maintenance of reduction and ability to instrument the fracture.   Katha Hamming, MD [facility]

## 2020-05-10 NOTE — Progress Notes (Signed)
Initial Nutrition Assessment  DOCUMENTATION CODES:   Obesity unspecified  INTERVENTION:   -MVI with minerals daily -Glucerna Shake po TID, each supplement provides 220 kcal and 10 grams of protein  NUTRITION DIAGNOSIS:   Increased nutrient needs related to post-op healing as evidenced by estimated needs.  GOAL:   Patient will meet greater than or equal to 90% of their needs  MONITOR:   PO intake,Supplement acceptance,Labs,Weight trends,Skin,I & O's  REASON FOR ASSESSMENT:   Consult Assessment of nutrition requirement/status,Hip fracture protocol  ASSESSMENT:   This is a 79 year old female with past medical history of hypertension, diabetes, hyperlipidemia, osteoporosis who presented to the ED after a trip and fall at her doctor's office today. Patient was walking into the cardiology office for a routine yearly appointment and she missed a step, tripped and fell. She did not hit her head and denies any loss of consciousness and is not on any blood thinners. No headache, nausea or vomiting or dizziness.  Does have some right knee pain but otherwise no complaints  Pt admitted with rt distal femur fracture s/p mechanical fall.   Reviewed I/O's: -300 ml x 24 hours  UOP: 300 ml x 24 hours  Per orthopedics notes, plan for surgery today.   Pt out of room at time of visit. No family present to provide additional history. Unable to obtain further nutrition-related history or complete nutrition-focused physical exam at this time.   Reviewed wt hx; wt has been stable over the past 8 months  Pt with increased nutritional needs for post-operative healing and would benefit from addition of oral nutrition supplements.   Lab Results  Component Value Date   HGBA1C 6.4 (H) 05/09/2020   PTA DM medications are 500 mg metformin BID.   Labs reviewed: CBGS: 347-425 (inpatient orders for glycemic control are 0-9 units insulin aspart TID with meals).   Diet Order:   Diet Order             Diet heart healthy/carb modified Room service appropriate? Yes; Fluid consistency: Thin  Diet effective now                 EDUCATION NEEDS:   No education needs have been identified at this time  Skin:  Skin Assessment: Reviewed RN Assessment  Last BM:  Unknown  Height:   Ht Readings from Last 1 Encounters:  05/10/20 5\' 5"  (1.651 m)    Weight:   Wt Readings from Last 1 Encounters:  05/10/20 86.2 kg    Ideal Body Weight:  56.8 kg  BMI:  Body mass index is 31.62 kg/m.  Estimated Nutritional Needs:   Kcal:  1800-2000  Protein:  100-115 grams  Fluid:  > 1.8 L    Loistine Chance, RD, LDN, [place] Registered Dietitian II Certified Diabetes Care and Education Specialist Please refer to [facility] for RD and/or RD on-call/weekend/after hours pager

## 2020-05-10 NOTE — Anesthesia Procedure Notes (Signed)
Anesthesia Regional Block: Adductor canal block   Pre-Anesthetic Checklist: ,, timeout performed, Correct Patient, Correct Site, Correct Laterality, Correct Procedure, Correct Position, site marked, Risks and benefits discussed,  Surgical consent,  Pre-op evaluation,  At surgeon's request and post-op pain management  Laterality: Right  Prep: chloraprep       Needles:  Injection technique: Single-shot  Needle Type: Echogenic Needle     Needle Length: 9cm  Needle Gauge: 21     Additional Needles:   Narrative:  Start time: 05/10/2020 12:10 PM End time: 05/10/2020 12:20 PM Injection made incrementally with aspirations every 5 mL.  Performed by: Personally  Anesthesiologist: Albertha Ghee, MD  Additional Notes: Pt tolerated the procedure well.

## 2020-05-10 NOTE — Plan of Care (Signed)

## 2020-05-10 NOTE — Progress Notes (Signed)
PROGRESS NOTE  Angela Chambers GHW:[id number] DOB: 08-17-1940   PCP: Horald Pollen, MD  Patient is from: Home  DOA: 05/09/2020 LOS: 1  Chief complaints: Right hip pain after fall  Brief Narrative / Interim history: 79 year old female with history of DM-2, HTN, HLD, nonobstructive CAD and right TKA  brought to ED after she missed a step and fell at her cardiologist office.  Denies hitting her head or LOC.  No prodromes.  In ED, hemodynamically stable.  Labs without significant finding.  X-ray revealed comminuted displaced and angulated distal right femoral metaphyseal fracture extending to the knee prosthesis.  Right hand x-ray with questionable tiny erosion of the fourth MCP joint, questionable torn scapholunate ligament and scattered degenerative changes.  Orthopedic surgery consulted, and recommended transfer to [facility] for surgery.  Subjective: Seen and examined earlier this morning.  No major events overnight or this morning.  Pain is dull after IV pain medication.  Denies chest pain, dyspnea, GI or UTI symptoms.  Husband at bedside.  Objective: Vitals:   05/10/20 0022 05/10/20 0447 05/10/20 0916 05/10/20 0948  BP: (!) 106/56 (!) 104/57 (!) 115/56 (!) 115/56  Pulse: 86 72 78 78  Resp: 16 16 14    Temp: 98 F (36.7 C) 98 F (36.7 C) 97.9 F (36.6 C)   TempSrc: Oral Oral Oral   SpO2: 98% 100% 98%   Weight:        Intake/Output Summary (Last 24 hours) at 05/10/2020 1120 Last data filed at 05/10/2020 0500 Gross per 24 hour  Intake --  Output 300 ml  Net -300 ml   Filed Weights   05/09/20 1857  Weight: 86.2 kg    Examination:  GENERAL: No apparent distress.  Nontoxic. HEENT: MMM.  Vision and hearing grossly intact.  NECK: Supple.  No apparent JVD.  RESP: On RA.  No IWOB.  Fair aeration bilaterally. CVS:  RRR. Heart sounds normal.  ABD/GI/GU: BS+. Abd soft, NTND.  MSK/EXT:  Moves extremities. No apparent deformity. No edema.  SKIN: no apparent skin lesion  or wound NEURO: Awake, alert and oriented appropriately.  No apparent focal neuro deficit. PSYCH: Calm. Normal affect.   Procedures:  None  Microbiology summarized: VELFY-10 and influenza PCR nonreactive.  Assessment & Plan: Accidental fall from standing height-missed the step and fell at her cardiologist office.  No prodromes. Right comminuted and displaced distal femoral fracture due to accidental fall -Plan for ORIF by Dr. Doreatha Martin today. -Pain control and DVT prophylaxis per orthopedic surgery -Check vitamin D  Controlled NIDDM-2 with hyperglycemia: A1c 6.4%.  On Metformin and glipizide at home. Recent Labs  Lab 05/09/20 2153 05/10/20 0812  GLUCAP 198* 170*  -Continue SSI-sensitive while n.p.o. -Continue atorvastatin  Essential hypertension: Normotensive but soft -Continue home amlodipine -Hold losartan given rising creatinine -Change metoprolol XL 50 mg from twice daily to daily  Nonobstructive CAD: Stable.  No anginal symptoms. -Continue home medications  Hyperlipidemia -Continue statin.  Hypokalemia: K3.4.  Normocytic anemia: Slight drop in hemoglobin, dilutional pattern with all cell lines down Recent Labs    05/09/20 1555 05/09/20 2131 05/10/20 0852  HGB 11.2* 10.9* 9.4*  -Check anemia panel -Monitor   Class I obesity Body mass index is 31.62 kg/m.  -Encourage lifestyle change to lose weight. -Could benefit from GLP-1 inhibitors.       DVT prophylaxis:  SCDs Start: 05/09/20 1522  Code Status: Full code Family Communication: Updated patient's husband at bedside. Status is: Inpatient  Remains inpatient appropriate because:Persistent severe electrolyte  disturbances, Ongoing diagnostic testing needed not appropriate for outpatient work up, Unsafe d/c plan and Inpatient level of care appropriate due to severity of illness   Dispo: The patient is from: Home              Anticipated d/c is to: To be determined              Anticipated d/c date is:  3 days              Patient currently is not medically stable to d/c.       Consultants:  Orthopedic surgery   Sch Meds:  Scheduled Meds: . amLODipine  5 mg Oral Daily  . aspirin EC  81 mg Oral Daily  . atorvastatin  20 mg Oral Daily  . brimonidine  1 drop Both Eyes BID  . chlorhexidine  60 mL Topical Once  . dorzolamide-timolol  1 drop Both Eyes BID  . insulin aspart  0-9 Units Subcutaneous TID WC  . ketorolac  15 mg Intravenous Once  . latanoprost  1 drop Both Eyes QHS  . metoprolol succinate  50 mg Oral Daily  . mupirocin ointment   Nasal BID  . povidone-iodine  2 application Topical Once   Continuous Infusions: .  ceFAZolin (ANCEF) IV    . potassium chloride     PRN Meds:.HYDROcodone-acetaminophen, HYDROmorphone (DILAUDID) injection, polyethylene glycol  Antimicrobials: Anti-infectives (From admission, onward)   Start     Dose/Rate Route Frequency Ordered Stop   05/10/20 0600  ceFAZolin (ANCEF) IVPB 2g/100 mL premix        2 g 200 mL/hr over 30 Minutes Intravenous On call to O.R. 05/09/20 1521 05/11/20 0559       I have personally reviewed the following labs and images: CBC: Recent Labs  Lab 05/09/20 1555 05/09/20 2131 05/10/20 0852  WBC 8.3 8.3 5.1  NEUTROABS 6.0  --   --   HGB 11.2* 10.9* 9.4*  HCT 31.6* 31.5* 26.8*  MCV 84.7 83.3 84.0  PLT 319 314 251   BMP &GFR Recent Labs  Lab 05/09/20 1555 05/09/20 2131 05/10/20 0852  NA 137 139 136  K 4.2 3.9 3.4*  CL 103 102 100  CO2 25 25 25   GLUCOSE 199* 222* 187*  BUN 20 24* 23  CREATININE 0.75 1.09* 0.94  CALCIUM 9.1 9.3 8.7*  MG  --   --  1.9  PHOS  --   --  3.7   Estimated Creatinine Clearance: 52.6 mL/min (by C-G formula based on SCr of 0.94 mg/dL). Liver & Pancreas: Recent Labs  Lab 05/10/20 0852  ALBUMIN 3.1*   No results for input(s): LIPASE, AMYLASE in the last 168 hours. No results for input(s): AMMONIA in the last 168 hours. Diabetic: Recent Labs    05/09/20 1555  HGBA1C  6.4*   Recent Labs  Lab 05/09/20 2153 05/10/20 0812  GLUCAP 198* 170*   Cardiac Enzymes: Recent Labs  Lab 05/10/20 0852  CKTOTAL 56   No results for input(s): PROBNP in the last 8760 hours. Coagulation Profile: No results for input(s): INR, PROTIME in the last 168 hours. Thyroid Function Tests: No results for input(s): TSH, T4TOTAL, FREET4, T3FREE, THYROIDAB in the last 72 hours. Lipid Profile: No results for input(s): CHOL, HDL, LDLCALC, TRIG, CHOLHDL, LDLDIRECT in the last 72 hours. Anemia Panel: No results for input(s): VITAMINB12, FOLATE, FERRITIN, TIBC, IRON, RETICCTPCT in the last 72 hours. Urine analysis:    Component Value Date/Time   COLORURINE  YELLOW 09/19/2013 1110   APPEARANCEUR Clear 02/07/2018 1135   LABSPEC 1.022 09/19/2013 1110   PHURINE 7.0 09/19/2013 1110   GLUCOSEU Negative 02/07/2018 1135   HGBUR NEGATIVE 09/19/2013 1110   BILIRUBINUR Negative 02/07/2018 1135   KETONESUR NEGATIVE 09/19/2013 1110   PROTEINUR Negative 02/07/2018 1135   PROTEINUR NEGATIVE 09/19/2013 1110   UROBILINOGEN 0.2 09/19/2013 1110   NITRITE Negative 02/07/2018 1135   NITRITE NEGATIVE 09/19/2013 1110   LEUKOCYTESUR Trace (A) 02/07/2018 1135   Sepsis Labs: Invalid input(s): PROCALCITONIN, [place]  Microbiology: Recent Results (from the past 240 hour(s))  Resp Panel by RT-PCR (Flu A&B, Covid) Nasopharyngeal Swab     Status: None   Collection Time: 05/09/20  3:55 PM   Specimen: Nasopharyngeal Swab; Nasopharyngeal(NP) swabs in vial transport medium  Result Value Ref Range Status   SARS Coronavirus 2 by RT PCR NEGATIVE NEGATIVE Final    Comment: (NOTE) SARS-CoV-2 target nucleic acids are NOT DETECTED.  The SARS-CoV-2 RNA is generally detectable in upper respiratory specimens during the acute phase of infection. The lowest concentration of SARS-CoV-2 viral copies this assay can detect is 138 copies/mL. A negative result does not preclude SARS-Cov-2 infection and should  not be used as the sole basis for treatment or other patient management decisions. A negative result may occur with  improper specimen collection/handling, submission of specimen other than nasopharyngeal swab, presence of viral mutation(s) within the areas targeted by this assay, and inadequate number of viral copies(<138 copies/mL). A negative result must be combined with clinical observations, patient history, and epidemiological information. The expected result is Negative.  Fact Sheet for Patients:  EntrepreneurPulse.com.au  Fact Sheet for Healthcare Providers:  IncredibleEmployment.be  This test is no t yet approved or cleared by the [place] FDA and  has been authorized for detection and/or diagnosis of SARS-CoV-2 by FDA under an Emergency Use Authorization (EUA). This EUA will remain  in effect (meaning this test can be used) for the duration of the COVID-19 declaration under Section 564(b)(1) of the Act, 21 U.S.C.section 360bbb-3(b)(1), unless the authorization is terminated  or revoked sooner.       Influenza A by PCR NEGATIVE NEGATIVE Final   Influenza B by PCR NEGATIVE NEGATIVE Final    Comment: (NOTE) The Xpert Xpress SARS-CoV-2/FLU/RSV plus assay is intended as an aid in the diagnosis of influenza from Nasopharyngeal swab specimens and should not be used as a sole basis for treatment. Nasal washings and aspirates are unacceptable for Xpert Xpress SARS-CoV-2/FLU/RSV testing.  Fact Sheet for Patients: EntrepreneurPulse.com.au  Fact Sheet for Healthcare Providers: IncredibleEmployment.be  This test is not yet approved or cleared by the [place] FDA and has been authorized for detection and/or diagnosis of SARS-CoV-2 by FDA under an Emergency Use Authorization (EUA). This EUA will remain in effect (meaning this test can be used) for the duration of the COVID-19 declaration under  Section 564(b)(1) of the Act, 21 U.S.C. section 360bbb-3(b)(1), unless the authorization is terminated or revoked.  Performed at [facility], [place] [address] [place], [place] [address]   Surgical pcr screen     Status: Abnormal   Collection Time: 05/09/20 10:34 PM   Specimen: Nasal Mucosa; Nasal Swab  Result Value Ref Range Status   MRSA, PCR NEGATIVE NEGATIVE Final   Staphylococcus aureus POSITIVE (A) NEGATIVE Final    Comment: (NOTE) The Xpert SA Assay (FDA approved for NASAL specimens in patients 39 years of age and older), is one component of a comprehensive surveillance  program. It is not intended to diagnose infection nor to guide or monitor treatment. Performed at [facility], [place] [address] [place], [place] [address]     Radiology Studies: DG Chest 2 View  Result Date: 05/09/2020 CLINICAL DATA:  fall EXAM: CHEST - 2 VIEW COMPARISON:  09/19/2013 and prior. FINDINGS: No focal consolidation. No pneumothorax or pleural effusion. Cardiomediastinal silhouette is within normal limits. No acute osseous abnormality. IMPRESSION: No focal airspace disease. Electronically Signed   By: Primitivo Gauze M.D.   On: 05/09/2020 15:00   DG Knee Complete 4 Views Right  Result Date: 05/09/2020 CLINICAL DATA:  Tripped and fell at [place] office today, RIGHT knee and hip pain EXAM: RIGHT KNEE - COMPLETE 4+ VIEW COMPARISON:  05/24/2012 FINDINGS: Interval RIGHT total knee arthroplasty. Diffuse osseous demineralization. Comminuted oblique fracture of distal RIGHT femoral metaphysis, displaced and apex anteriorly angulated, extending into femoral component of knee prosthesis. No dislocation. Visualized tibia and fibula appear intact. IMPRESSION: Prior RIGHT total knee arthroplasty. Osseous demineralization with comminuted displaced and angulated distal RIGHT femoral metaphyseal fracture extending to knee prosthesis. Electronically Signed   By: Lavonia Dana M.D.   On:  05/09/2020 13:16   DG Hand Complete Right  Result Date: 05/09/2020 CLINICAL DATA:  Tripped and fell at [place] office today, RIGHT knee and hip pain, abrasion RIGHT hand EXAM: RIGHT HAND - COMPLETE 3+ VIEW COMPARISON:  None FINDINGS: Osseous demineralization. Degenerative changes at first [facility] joint, minimally at first MCP joint. Slight widening of scapholunate interval question torn scapholunate ligament. 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 Scattered narrowing of IP joints. No acute fracture, dislocation, or bone destruction. Question small juxta-articular erosion at fourth MCP joint. IMPRESSION: Scattered degenerative changes as above. Questionable tiny erosion at the fourth MCP joint Question torn scapholunate ligament. Electronically Signed   By: Lavonia Dana M.D.   On: 05/09/2020 13:15   DG Hip Unilat W or Wo Pelvis 2-3 Views Right  Result Date: 05/09/2020 CLINICAL DATA:  Tripped and fell at [place] office today, RIGHT hip and knee pain EXAM: DG HIP (WITH OR WITHOUT PELVIS) 2-3V RIGHT COMPARISON:  None FINDINGS: Osseous demineralization. Narrowing of the hip joints bilaterally. SI joints unremarkable. No acute fracture, dislocation, or bone destruction. IMPRESSION: No acute osseous abnormalities. Osseous demineralization with mild degenerative changes of the hip joints. Please refer to RIGHT knee radiographic exam for description of distal RIGHT femoral fracture. Electronically Signed   By: Lavonia Dana M.D.   On: 05/09/2020 13:17      Chardonnay Holzmann T. Brooke  If 7PM-7AM, please contact night-coverage [email or web address] 05/10/2020, 11:20 AM

## 2020-05-10 NOTE — Interval H&P Note (Signed)
History and Physical Interval Note:  05/10/2020 12:35 PM  Angela Chambers  has presented today for surgery, with the diagnosis of Right distal femur fx.  The various methods of treatment have been discussed with the patient and family. After consideration of risks, benefits and other options for treatment, the patient has consented to  Procedure(s): OPEN REDUCTION INTERNAL FIXATION (ORIF) DISTAL FEMUR FRACTURE (Right) as a surgical intervention.  The patient's history has been reviewed, patient examined, no change in status, stable for surgery.  I have reviewed the patient's chart and labs.  Questions were answered to the patient's satisfaction.     Angela Chambers

## 2020-05-11 DIAGNOSIS — R350 Frequency of micturition: Secondary | ICD-10-CM

## 2020-05-11 LAB — URINALYSIS, ROUTINE W REFLEX MICROSCOPIC
Bacteria, UA: NONE SEEN
Bilirubin Urine: NEGATIVE
Glucose, UA: >=500 mg/dL — AB
Hgb urine dipstick: NEGATIVE
Ketones, ur: NEGATIVE mg/dL
Leukocytes,Ua: NEGATIVE
Nitrite: NEGATIVE
Protein, ur: NEGATIVE mg/dL
Specific Gravity, Urine: 1.013 (ref 1.005–1.030)
pH: 5 (ref 5.0–8.0)

## 2020-05-11 LAB — RENAL FUNCTION PANEL
Albumin: 3.1 g/dL — ABNORMAL LOW (ref 3.5–5.0)
Anion gap: 9 (ref 5–15)
BUN: 18 mg/dL (ref 8–23)
CO2: 24 mmol/L (ref 22–32)
Calcium: 8.5 mg/dL — ABNORMAL LOW (ref 8.9–10.3)
Chloride: 103 mmol/L (ref 98–111)
Creatinine, Ser: 0.91 mg/dL (ref 0.44–1.00)
GFR, Estimated: >60 mL/min (ref >60)
Glucose, Bld: 257 mg/dL — ABNORMAL HIGH (ref 70–99)
Phosphorus: 2.9 mg/dL (ref 2.5–4.6)
Potassium: 4.3 mmol/L (ref 3.5–5.1)
Sodium: 136 mmol/L (ref 135–145)

## 2020-05-11 LAB — IRON AND TIBC
Iron: 18 ug/dL — ABNORMAL LOW (ref 28–170)
Saturation Ratios: 6 % — ABNORMAL LOW (ref 10.4–31.8)
TIBC: 307 ug/dL (ref 250–450)
UIBC: 289 ug/dL

## 2020-05-11 LAB — CBC
HCT: 22.9 % — ABNORMAL LOW (ref 36.0–46.0)
Hemoglobin: 8.4 g/dL — ABNORMAL LOW (ref 12.0–15.0)
MCH: 30.3 pg (ref 26.0–34.0)
MCHC: 36.7 g/dL — ABNORMAL HIGH (ref 30.0–36.0)
MCV: 82.7 fL (ref 80.0–100.0)
Platelets: 230 10*3/uL (ref 150–400)
RBC: 2.77 MIL/uL — ABNORMAL LOW (ref 3.87–5.11)
RDW: 14.4 % (ref 11.5–15.5)
WBC: 7 10*3/uL (ref 4.0–10.5)
nRBC: 0 % (ref 0.0–0.2)

## 2020-05-11 LAB — RETICULOCYTES
Immature Retic Fract: 19.3 % — ABNORMAL HIGH (ref 2.3–15.9)
RBC.: 2.86 MIL/uL — ABNORMAL LOW (ref 3.87–5.11)
Retic Count, Absolute: 53.5 10*3/uL (ref 19.0–186.0)
Retic Ct Pct: 1.9 % (ref 0.4–3.1)

## 2020-05-11 LAB — FERRITIN: Ferritin: 68 ng/mL (ref 11–307)

## 2020-05-11 LAB — GLUCOSE, CAPILLARY
Glucose-Capillary: 213 mg/dL — ABNORMAL HIGH (ref 70–99)
Glucose-Capillary: 209 mg/dL — ABNORMAL HIGH (ref 70–99)
Glucose-Capillary: 147 mg/dL — ABNORMAL HIGH (ref 70–99)
Glucose-Capillary: 210 mg/dL — ABNORMAL HIGH (ref 70–99)

## 2020-05-11 LAB — MAGNESIUM: Magnesium: 2.1 mg/dL (ref 1.7–2.4)

## 2020-05-11 LAB — VITAMIN D 25 HYDROXY (VIT D DEFICIENCY, FRACTURES): Vit D, 25-Hydroxy: 35.33 ng/mL (ref 30–100)

## 2020-05-11 LAB — FOLATE: Folate: 19.9 ng/mL (ref >5.9)

## 2020-05-11 LAB — VITAMIN B12: Vitamin B-12: 361 pg/mL (ref 180–914)

## 2020-05-11 MED ORDER — ACETAMINOPHEN 500 MG PO TABS
1000.0000 mg | ORAL_TABLET | Freq: Three times a day (TID) | ORAL | Status: DC
  Administered 2020-05-11 – 2020-05-14 (×7): 1000 mg via ORAL
  Filled 2020-05-11 (×9): qty 2

## 2020-05-11 MED ORDER — INSULIN GLARGINE 100 UNIT/ML ~~LOC~~ SOLN
25.0000 [IU] | Freq: Every day | SUBCUTANEOUS | Status: DC
  Administered 2020-05-11 – 2020-05-13 (×3): 25 [IU] via SUBCUTANEOUS
  Filled 2020-05-11 (×4): qty 0.25

## 2020-05-11 MED ORDER — BETHANECHOL CHLORIDE 10 MG PO TABS
10.0000 mg | ORAL_TABLET | Freq: Three times a day (TID) | ORAL | Status: DC
  Administered 2020-05-11 – 2020-05-12 (×4): 10 mg via ORAL
  Filled 2020-05-11 (×5): qty 1

## 2020-05-11 MED ORDER — POLYSACCHARIDE IRON COMPLEX 150 MG PO CAPS
150.0000 mg | ORAL_CAPSULE | Freq: Two times a day (BID) | ORAL | Status: DC
  Administered 2020-05-11 – 2020-05-14 (×6): 150 mg via ORAL
  Filled 2020-05-11 (×8): qty 1

## 2020-05-11 MED ORDER — ALUM & MAG HYDROXIDE-SIMETH 200-200-20 MG/5ML PO SUSP
30.0000 mL | Freq: Four times a day (QID) | ORAL | Status: DC | PRN
  Administered 2020-05-11 – 2020-05-13 (×2): 30 mL via ORAL
  Filled 2020-05-11 (×2): qty 30

## 2020-05-11 MED ORDER — SODIUM CHLORIDE 0.9 % IV SOLN
510.0000 mg | Freq: Once | INTRAVENOUS | Status: AC
  Administered 2020-05-11: 10:00:00 510 mg via INTRAVENOUS
  Filled 2020-05-11: qty 17

## 2020-05-11 MED ORDER — INSULIN ASPART 100 UNIT/ML ~~LOC~~ SOLN
3.0000 [IU] | Freq: Three times a day (TID) | SUBCUTANEOUS | Status: DC
  Administered 2020-05-11 – 2020-05-12 (×3): 3 [IU] via SUBCUTANEOUS

## 2020-05-11 MED ORDER — DICYCLOMINE HCL 10 MG PO CAPS
10.0000 mg | ORAL_CAPSULE | Freq: Once | ORAL | Status: AC
  Administered 2020-05-11: 14:00:00 10 mg via ORAL
  Filled 2020-05-11: qty 1

## 2020-05-11 MED ORDER — LOSARTAN POTASSIUM 50 MG PO TABS
50.0000 mg | ORAL_TABLET | Freq: Every day | ORAL | Status: DC
  Administered 2020-05-11 – 2020-05-14 (×4): 50 mg via ORAL
  Filled 2020-05-11 (×4): qty 1

## 2020-05-11 MED ORDER — INSULIN ASPART 100 UNIT/ML ~~LOC~~ SOLN
0.0000 [IU] | Freq: Every day | SUBCUTANEOUS | Status: DC
  Administered 2020-05-11: 22:00:00 2 [IU] via SUBCUTANEOUS

## 2020-05-11 MED ORDER — ONDANSETRON HCL 4 MG/2ML IJ SOLN
4.0000 mg | Freq: Three times a day (TID) | INTRAMUSCULAR | Status: DC | PRN
  Administered 2020-05-13: 10:00:00 4 mg via INTRAVENOUS
  Filled 2020-05-11 (×2): qty 2

## 2020-05-11 MED ORDER — VITAMIN D 25 MCG (1000 UNIT) PO TABS
5000.0000 [IU] | ORAL_TABLET | Freq: Every day | ORAL | Status: DC
  Administered 2020-05-11 – 2020-05-14 (×3): 5000 [IU] via ORAL
  Filled 2020-05-11 (×3): qty 5

## 2020-05-11 MED ORDER — INSULIN ASPART 100 UNIT/ML ~~LOC~~ SOLN
0.0000 [IU] | Freq: Three times a day (TID) | SUBCUTANEOUS | Status: DC
  Administered 2020-05-11 (×2): 3 [IU] via SUBCUTANEOUS
  Administered 2020-05-11: 18:00:00 2 [IU] via SUBCUTANEOUS
  Administered 2020-05-12: 13:00:00 3 [IU] via SUBCUTANEOUS

## 2020-05-11 NOTE — Evaluation (Signed)
Physical Therapy Evaluation Patient Details Name: Angela Chambers MRN: [id number] DOB: 04-02-1941 Today's Date: 05/11/2020   History of Present Illness  79yo female who fell and sustained a distal femur periprosthetic fracture R LE. Received RLE ORIF 05/10/20. PMH DM, HLD, HTN, obesity, osteoporosis, B TKR  Clinical Impression   Patient received in bed, very pleasant and cooperative. Needed heavy levels of physical assistance to mobilize to EOB, but able to maintain midline easily once there. Made multiple attempts at standing with both RW and stedy with bed elevated but unable to even get to a partial standing position with just one person assist. Returned to supine and rolled multiple times with min guard-MinA for repositioning of chuck pads. Left in bed positioned to comfort with all needs met, bed alarm active and nursing staff aware of patient status. Would likely benefit from intensive therapies in the CIR setting moving forward.     Follow Up Recommendations CIR;Supervision for mobility/OOB    Equipment Recommendations  Rolling walker with 5" wheels;3in1 (PT);Wheelchair (measurements PT);Wheelchair cushion (measurements PT) (sliding board)    Recommendations for Other Services       Precautions / Restrictions Precautions Precautions: Fall Restrictions Weight Bearing Restrictions: Yes RLE Weight Bearing: Weight bearing as tolerated      Mobility  Bed Mobility Overal bed mobility: Needs Assistance Bed Mobility: Supine to Sit;Sit to Supine     Supine to sit: Mod assist Sit to supine: Mod assist   General bed mobility comments: ModA for RLE management and support, extended time and effort    Transfers Overall transfer level: Needs assistance Equipment used: Rolling walker (2 wheeled);Ambulation equipment used Transfers: Sit to/from Stand Sit to Stand: Total assist;From elevated surface         General transfer comment: made multiple attempts at standing with  both RW and stedy from elevated surface- unable to get to full upright with +1 assist due to weakness and pain  Ambulation/Gait             General Gait Details: unable  Stairs            Wheelchair Mobility    Modified Rankin (Stroke Patients Only)       Balance Overall balance assessment: History of Falls;Needs assistance Sitting-balance support: Bilateral upper extremity supported;Feet supported Sitting balance-Leahy Scale: Good         Standing balance comment: unable to get to standing to assess today                             Pertinent Vitals/Pain Pain Assessment: Faces Faces Pain Scale: Hurts even more Pain Location: R LE Pain Descriptors / Indicators: Aching;Tightness;Sharp Pain Intervention(s): Limited activity within patient's tolerance;Monitored during session;Premedicated before session    Home Living Family/patient expects to be discharged to:: Private residence Living Arrangements: Spouse/significant other Available Help at Discharge: Family;Available 24 hours/day Type of Home: House Home Access: Stairs to enter Entrance Stairs-Rails: None Entrance Stairs-Number of Steps: 1 Home Layout: Two level Home Equipment: Walker - 2 wheels;Bedside commode;Shower seat;Cane - single point      Prior Function Level of Independence: Independent               Hand Dominance        Extremity/Trunk Assessment   Upper Extremity Assessment Upper Extremity Assessment: Defer to OT evaluation    Lower Extremity Assessment Lower Extremity Assessment: Generalized weakness    Cervical / Trunk Assessment Cervical /  Trunk Assessment: Kyphotic  Communication   Communication: No difficulties  Cognition Arousal/Alertness: Awake/alert Behavior During Therapy: WFL for tasks assessed/performed Overall Cognitive Status: Within Functional Limits for tasks assessed                                        General Comments  General comments (skin integrity, edema, etc.): SpO2 90-94% on RA    Exercises     Assessment/Plan    PT Assessment Patient needs continued PT services  PT Problem List Decreased strength;Obesity;Decreased activity tolerance;Decreased safety awareness;Decreased balance;Decreased knowledge of precautions;Decreased mobility;Decreased coordination;Pain;Decreased range of motion       PT Treatment Interventions DME instruction;Balance training;Gait training;Stair training;Functional mobility training;Patient/family education;Therapeutic activities;Therapeutic exercise;Wheelchair mobility training;Manual techniques    PT Goals (Current goals can be found in the Care Plan section)  Acute Rehab PT Goals Patient Stated Goal: go home when able PT Goal Formulation: With patient Time For Goal Achievement: 05/25/20 Potential to Achieve Goals: Fair    Frequency Min 3X/week   Barriers to discharge        Co-evaluation               AM-PAC PT "6 Clicks" Mobility  Outcome Measure Help needed turning from your back to your side while in a flat bed without using bedrails?: A Little Help needed moving from lying on your back to sitting on the side of a flat bed without using bedrails?: A Lot Help needed moving to and from a bed to a chair (including a wheelchair)?: Total Help needed standing up from a chair using your arms (e.g., wheelchair or bedside chair)?: Total Help needed to walk in hospital room?: Total Help needed climbing 3-5 steps with a railing? : Total 6 Click Score: 9    End of Session Equipment Utilized During Treatment: Gait belt Activity Tolerance: Patient tolerated treatment well Patient left: in bed;with call bell/phone within reach;with bed alarm set Nurse Communication: Mobility status;Weight bearing status PT Visit Diagnosis: Unsteadiness on feet (R26.81);Difficulty in walking, not elsewhere classified (R26.2);Muscle weakness (generalized) (M62.81);History of  falling (Z91.81);Pain Pain - Right/Left: Right Pain - part of body: Leg    Time: 2583-4621 PT Time Calculation (min) (ACUTE ONLY): 44 min   Charges:   PT Evaluation $PT Eval Moderate Complexity: 1 Mod PT Treatments $Therapeutic Activity: 23-37 mins        Windell Norfolk, DPT, PN1   Supplemental Physical Therapist [place]    Pager [phone] Acute Rehab Office [phone]

## 2020-05-11 NOTE — Progress Notes (Signed)
Subjective: 1 Day Post-Op Procedure(s) (LRB): OPEN REDUCTION INTERNAL FIXATION (ORIF) DISTAL FEMUR FRACTURE (Right) Patient reports pain as 4 on 0-10 scale.    Objective: Vital signs in last 24 hours: Temp:  [97.4 F (36.3 C)-98.5 F (36.9 C)] 97.6 F (36.4 C) (12/11 0813) Pulse Rate:  [71-87] 86 (12/11 0813) Resp:  [14-23] 17 (12/11 0813) BP: (106-147)/(55-73) 140/71 (12/11 0813) SpO2:  [93 %-99 %] 99 % (12/11 0813) Weight:  [86.2 kg] 86.2 kg (12/10 1211)  Intake/Output from previous day: 12/10 0701 - 12/11 0700 In: 874.7 [I.V.:500; IV Piggyback:374.7] Out: 250 [Urine:200; Blood:50] Intake/Output this shift: No intake/output data recorded.  Recent Labs    05/09/20 1555 05/09/20 2131 05/10/20 0852 05/11/20 0246  HGB 11.2* 10.9* 9.4* 8.4*   Recent Labs    05/10/20 0852 05/11/20 0246  WBC 5.1 7.0  RBC 3.19* 2.77*  2.86*  HCT 26.8* 22.9*  PLT 251 230   Recent Labs    05/10/20 0852 05/11/20 0246  NA 136 136  K 3.4* 4.3  CL 100 103  CO2 25 24  BUN 23 18  CREATININE 0.94 0.91  GLUCOSE 187* 257*  CALCIUM 8.7* 8.5*   No results for input(s): LABPT, INR in the last 72 hours.  ABD soft Neurovascular intact   Assessment/Plan: 1 Day Post-Op Procedure(s) (LRB): OPEN REDUCTION INTERNAL FIXATION (ORIF) DISTAL FEMUR FRACTURE (Right) Advance diet Up with therapy weight bearing as tolerated on right leg.   Added iron, Vit D3, to correct deficiencies and lantus at night to better control diabetes while off metformin since she is post op.        Angela Chambers Angela Chambers 05/11/2020, 8:16 AM

## 2020-05-11 NOTE — Plan of Care (Signed)
  Problem: Education: Goal: Knowledge of General Education information will improve Description: Including pain rating scale, medication(s)/side effects and non-pharmacologic comfort measures Outcome: Progressing   Problem: Pain Managment: Goal: General experience of comfort will improve Outcome: Progressing   Problem: Activity: Goal: Risk for activity intolerance will decrease Outcome: Not Progressing   Problem: Nutrition: Goal: Adequate nutrition will be maintained Outcome: Not Progressing   Problem: Elimination: Goal: Will not experience complications related to urinary retention Outcome: Not Progressing   Problem: Safety: Goal: Ability to remain free from injury will improve Outcome: Not Progressing

## 2020-05-11 NOTE — Progress Notes (Signed)
PROGRESS NOTE  Angela Chambers LYY:[id number] DOB: 1940-10-05   PCP: Horald Pollen, MD  Patient is from: Home  DOA: 05/09/2020 LOS: 2  Chief complaints: Right hip pain after fall  Brief Narrative / Interim history: 79 year old female with history of DM-2, HTN, HLD, nonobstructive CAD and right TKA  brought to ED after she missed a step and fell at her cardiologist office.  Denies hitting her head or LOC.  No prodromes.  In ED, hemodynamically stable.  Labs without significant finding.  X-ray revealed comminuted displaced and angulated distal right femoral metaphyseal fracture extending to the knee prosthesis.  Right hand x-ray with questionable tiny erosion of the fourth MCP joint, questionable torn scapholunate ligament and scattered degenerative changes.    Patient underwent ORIF of distal femoral fracture by Dr. Doreatha Martin on 05/10/2020.  Subjective: Seen and examined earlier this morning.  No major events overnight of this morning.  No complaints.  Pain resolved after pain medication this morning.  Denies chest pain, dyspnea, lightheadedness, GI or UTI symptoms.  However, patient reported urinary frequency without dysuria or urgency to RN later in the morning.  Objective: Vitals:   05/10/20 1924 05/11/20 0024 05/11/20 0327 05/11/20 0813  BP: 132/73 138/72 (!) 121/59 140/71  Pulse: 87 85 77 86  Resp: 17 18 16 17   Temp: 98.5 F (36.9 C) 98 F (36.7 C) 97.9 F (36.6 C) 97.6 F (36.4 C)  TempSrc:  Oral  Oral  SpO2: 99% 98% 96% 99%  Weight:      Height:        Intake/Output Summary (Last 24 hours) at 05/11/2020 1244 Last data filed at 05/11/2020 0900 Gross per 24 hour  Intake 1114.68 ml  Output 250 ml  Net 864.68 ml   Filed Weights   05/09/20 1857 05/10/20 1211  Weight: 86.2 kg 86.2 kg    Examination:  GENERAL: No apparent distress.  Nontoxic. HEENT: MMM.  Vision and hearing grossly intact.  NECK: Supple.  No apparent JVD.  RESP: On RA.  No IWOB.  Fair  aeration bilaterally. CVS:  RRR. Heart sounds normal.  ABD/GI/GU: BS+. Abd soft, NTND.  MSK/EXT:  Moves extremities. No apparent deformity. No edema.  Neurovascular intact. SKIN: Dressing over RLE DCI. NEURO: Awake, alert and oriented appropriately.  No apparent focal neuro deficit. PSYCH: Calm. Normal affect.   Procedures:  05/10/2020-ORIF of distal femoral fracture by Dr. Doreatha Martin  Microbiology summarized: LEXNT-70 and influenza PCR nonreactive.  Assessment & Plan: Accidental fall from standing height-missed the step and fell at her cardiologist office.  No prodromes. Right comminuted and displaced distal femoral fracture due to accidental fall -Vitamin D within normal. -S/p ORIF by Dr. Doreatha Martin on 05/10/2020. -Pain fairly controlled on current regimen. -WBAT on RLE.  PT/OT  Controlled NIDDM-2 with hyperglycemia: A1c 6.4%.  On Metformin and glipizide at home. Recent Labs  Lab 05/10/20 1436 05/10/20 1722 05/10/20 1921 05/11/20 0654 05/11/20 1133  GLUCAP 194* 202* 310* 213* 209*  -Continue SSI-sensitive -Add nightly coverage and NovoLog AC 3 units -Further adjustment as appropriate. -Continue atorvastatin  Essential hypertension: Normotensive -Continue home amlodipine -Resume home losartan. -Continue home metoprolol XL 50 mg daily.  Nonobstructive CAD: Stable.  No anginal symptoms. -Continue home medications as above.  Hyperlipidemia -Continue statin.  Hypokalemia: Resolved.  Iron deficiency anemia: slight drop in Hgb, dilutional pattern with all cell lines down.  Denies melena or hematochezia.  Anemia panel suggests iron deficiency.  Iron sat 6%.  TIBC 307.  Ferritin 68. Recent  Labs    05/09/20 1555 05/09/20 2131 05/10/20 0852 05/11/20 0246  HGB 11.2* 10.9* 9.4* 8.4*  -IV Feraheme on 05/11/2020 -Monitor H&H.  Urinary frequency: Could be irritation from recent catheterization. -Check urinalysis and urine culture -Could try AZO once urine sample was  collected.  Class I obesity Body mass index is 31.62 kg/m. Nutrition Problem: Increased nutrient needs Etiology: post-op healing-Encourage lifestyle change to lose weight. -Could benefit from GLP-1 inhibitors. Signs/Symptoms: estimated needs Interventions: Glucerna shake,MVI   DVT prophylaxis:  enoxaparin (LOVENOX) injection 40 mg Start: 05/11/20 0900 SCDs Start: 05/09/20 1522  Code Status: Full code Family Communication: Updated patient's husband at bedside. Status is: Inpatient  Remains inpatient appropriate because:Unsafe d/c plan and Inpatient level of care appropriate due to severity of illness   Dispo: The patient is from: Home              Anticipated d/c is to: To be determined              Anticipated d/c date is: 2 days              Patient currently is not medically stable to d/c.       Consultants:  Orthopedic surgery   Sch Meds:  Scheduled Meds: . amLODipine  5 mg Oral Daily  . aspirin EC  81 mg Oral Daily  . atorvastatin  20 mg Oral Daily  . brimonidine  1 drop Both Eyes BID  . celecoxib  100 mg Oral BID  . cholecalciferol  5,000 Units Oral Daily  . dorzolamide-timolol  1 drop Both Eyes BID  . enoxaparin (LOVENOX) injection  40 mg Subcutaneous Q24H  . feeding supplement (GLUCERNA SHAKE)  237 mL Oral TID BM  . gabapentin  100 mg Oral TID  . insulin aspart  0-5 Units Subcutaneous QHS  . insulin aspart  0-9 Units Subcutaneous TID WC  . insulin aspart  3 Units Subcutaneous TID WC  . insulin glargine  25 Units Subcutaneous QHS  . iron polysaccharides  150 mg Oral BID  . latanoprost  1 drop Both Eyes QHS  . losartan  50 mg Oral Daily  . metoprolol succinate  50 mg Oral Daily  . multivitamin with minerals  1 tablet Oral Daily  . mupirocin ointment   Nasal BID   Continuous Infusions: .  ceFAZolin (ANCEF) IV 2 g (05/11/20 0554)   PRN Meds:.acetaminophen, HYDROcodone-acetaminophen, HYDROmorphone (DILAUDID) injection, methocarbamol, polyethylene  glycol  Antimicrobials: Anti-infectives (From admission, onward)   Start     Dose/Rate Route Frequency Ordered Stop   05/10/20 2200  ceFAZolin (ANCEF) IVPB 2g/100 mL premix        2 g 200 mL/hr over 30 Minutes Intravenous Every 8 hours 05/10/20 1650 05/11/20 2159   05/10/20 1339  vancomycin (VANCOCIN) powder  Status:  Discontinued          As needed 05/10/20 1339 05/10/20 1429   05/10/20 0600  ceFAZolin (ANCEF) IVPB 2g/100 mL premix        2 g 200 mL/hr over 30 Minutes Intravenous On call to O.R. 05/09/20 1521 05/10/20 1325       I have personally reviewed the following labs and images: CBC: Recent Labs  Lab 05/09/20 1555 05/09/20 2131 05/10/20 0852 05/11/20 0246  WBC 8.3 8.3 5.1 7.0  NEUTROABS 6.0  --   --   --   HGB 11.2* 10.9* 9.4* 8.4*  HCT 31.6* 31.5* 26.8* 22.9*  MCV 84.7 83.3 84.0 82.7  PLT 319  314 251 230   BMP &GFR Recent Labs  Lab 05/09/20 1555 05/09/20 2131 05/10/20 0852 05/11/20 0246  NA 137 139 136 136  K 4.2 3.9 3.4* 4.3  CL 103 102 100 103  CO2 25 25 25 24   GLUCOSE 199* 222* 187* 257*  BUN 20 24* 23 18  CREATININE 0.75 1.09* 0.94 0.91  CALCIUM 9.1 9.3 8.7* 8.5*  MG  --   --  1.9 2.1  PHOS  --   --  3.7 2.9   Estimated Creatinine Clearance: 54.4 mL/min (by C-G formula based on SCr of 0.91 mg/dL). Liver & Pancreas: Recent Labs  Lab 05/10/20 0852 05/11/20 0246  ALBUMIN 3.1* 3.1*   No results for input(s): LIPASE, AMYLASE in the last 168 hours. No results for input(s): AMMONIA in the last 168 hours. Diabetic: Recent Labs    05/09/20 1555  HGBA1C 6.4*   Recent Labs  Lab 05/10/20 1436 05/10/20 1722 05/10/20 1921 05/11/20 0654 05/11/20 1133  GLUCAP 194* 202* 310* 213* 209*   Cardiac Enzymes: Recent Labs  Lab 05/10/20 0852  CKTOTAL 56   No results for input(s): PROBNP in the last 8760 hours. Coagulation Profile: No results for input(s): INR, PROTIME in the last 168 hours. Thyroid Function Tests: No results for input(s): TSH,  T4TOTAL, FREET4, T3FREE, THYROIDAB in the last 72 hours. Lipid Profile: No results for input(s): CHOL, HDL, LDLCALC, TRIG, CHOLHDL, LDLDIRECT in the last 72 hours. Anemia Panel: Recent Labs    05/11/20 0246  VITAMINB12 361  FOLATE 19.9  FERRITIN 68  TIBC 307  IRON 18*  RETICCTPCT 1.9   Urine analysis:    Component Value Date/Time   COLORURINE YELLOW 09/19/2013 1110   APPEARANCEUR Clear 02/07/2018 1135   LABSPEC 1.022 09/19/2013 1110   PHURINE 7.0 09/19/2013 1110   GLUCOSEU Negative 02/07/2018 1135   HGBUR NEGATIVE 09/19/2013 1110   BILIRUBINUR Negative 02/07/2018 1135   KETONESUR NEGATIVE 09/19/2013 1110   PROTEINUR Negative 02/07/2018 1135   PROTEINUR NEGATIVE 09/19/2013 1110   UROBILINOGEN 0.2 09/19/2013 1110   NITRITE Negative 02/07/2018 1135   NITRITE NEGATIVE 09/19/2013 1110   LEUKOCYTESUR Trace (A) 02/07/2018 1135   Sepsis Labs: Invalid input(s): PROCALCITONIN, [place]  Microbiology: Recent Results (from the past 240 hour(s))  Resp Panel by RT-PCR (Flu A&B, Covid) Nasopharyngeal Swab     Status: None   Collection Time: 05/09/20  3:55 PM   Specimen: Nasopharyngeal Swab; Nasopharyngeal(NP) swabs in vial transport medium  Result Value Ref Range Status   SARS Coronavirus 2 by RT PCR NEGATIVE NEGATIVE Final    Comment: (NOTE) SARS-CoV-2 target nucleic acids are NOT DETECTED.  The SARS-CoV-2 RNA is generally detectable in upper respiratory specimens during the acute phase of infection. The lowest concentration of SARS-CoV-2 viral copies this assay can detect is 138 copies/mL. A negative result does not preclude SARS-Cov-2 infection and should not be used as the sole basis for treatment or other patient management decisions. A negative result may occur with  improper specimen collection/handling, submission of specimen other than nasopharyngeal swab, presence of viral mutation(s) within the areas targeted by this assay, and inadequate number of  viral copies(<138 copies/mL). A negative result must be combined with clinical observations, patient history, and epidemiological information. The expected result is Negative.  Fact Sheet for Patients:  EntrepreneurPulse.com.au  Fact Sheet for Healthcare Providers:  IncredibleEmployment.be  This test is no t yet approved or cleared by the [place] FDA and  has been authorized for detection and/or diagnosis  of SARS-CoV-2 by FDA under an Emergency Use Authorization (EUA). This EUA will remain  in effect (meaning this test can be used) for the duration of the COVID-19 declaration under Section 564(b)(1) of the Act, 21 U.S.C.section 360bbb-3(b)(1), unless the authorization is terminated  or revoked sooner.       Influenza A by PCR NEGATIVE NEGATIVE Final   Influenza B by PCR NEGATIVE NEGATIVE Final    Comment: (NOTE) The Xpert Xpress SARS-CoV-2/FLU/RSV plus assay is intended as an aid in the diagnosis of influenza from Nasopharyngeal swab specimens and should not be used as a sole basis for treatment. Nasal washings and aspirates are unacceptable for Xpert Xpress SARS-CoV-2/FLU/RSV testing.  Fact Sheet for Patients: EntrepreneurPulse.com.au  Fact Sheet for Healthcare Providers: IncredibleEmployment.be  This test is not yet approved or cleared by the [place] FDA and has been authorized for detection and/or diagnosis of SARS-CoV-2 by FDA under an Emergency Use Authorization (EUA). This EUA will remain in effect (meaning this test can be used) for the duration of the COVID-19 declaration under Section 564(b)(1) of the Act, 21 U.S.C. section 360bbb-3(b)(1), unless the authorization is terminated or revoked.  Performed at [facility], [place] [address] [place], [place] [address]   Surgical pcr screen     Status: Abnormal   Collection Time: 05/09/20 10:34 PM   Specimen: Nasal  Mucosa; Nasal Swab  Result Value Ref Range Status   MRSA, PCR NEGATIVE NEGATIVE Final   Staphylococcus aureus POSITIVE (A) NEGATIVE Final    Comment: (NOTE) The Xpert SA Assay (FDA approved for NASAL specimens in patients 11 years of age and older), is one component of a comprehensive surveillance program. It is not intended to diagnose infection nor to guide or monitor treatment. Performed at [facility], [place] [address] [place], [place] [address]     Radiology Studies: DG Knee Right Port  Result Date: 05/10/2020 CLINICAL DATA:  Postoperative evaluation. EXAM: PORTABLE RIGHT KNEE - 1-2 VIEW COMPARISON:  May 09, 2020 FINDINGS: A right knee replacement is seen without evidence of surrounding lucency to suggest the presence of hardware loosening or infection. A large radiopaque fixation plate and screws are seen along the lateral aspect of the mid and distal right femur. This represents a new finding when compared to the prior exam. Acute fracture of the distal right femoral shaft is seen with gross anatomic alignment. There is no evidence of dislocation. A small joint effusion is seen. IMPRESSION: 1. Intact right knee replacement with interval internal fixation of the mid and distal right femur. Electronically Signed   By: Virgina Norfolk M.D.   On: 05/10/2020 16:43   DG C-Arm 1-60 Min  Result Date: 05/10/2020 CLINICAL DATA:  Trauma. EXAM: RIGHT FEMUR 2 VIEWS; DG C-ARM 1-60 MIN COMPARISON:  December 9, 21 radiographs. FINDINGS: Fluoro time: 58 seconds. Radiation: 5.56 mGy. Six C-arm fluoroscopic images were obtained intraoperatively and submitted for post operative interpretation. These images demonstrate postsurgical changes of plate and screw fixation of the distal femoral fracture. Prior right total knee arthroplasty is imaged. Please see the performing provider's procedural report for further detail. IMPRESSION: Intraoperative fluoroscopic images, as detailed above.  Electronically Signed   By: Margaretha Sheffield MD   On: 05/10/2020 16:56   DG FEMUR, MIN 2 VIEWS RIGHT  Result Date: 05/10/2020 CLINICAL DATA:  Trauma. EXAM: RIGHT FEMUR 2 VIEWS; DG C-ARM 1-60 MIN COMPARISON:  December 9, 21 radiographs. FINDINGS: Fluoro time: 58 seconds. Radiation: 5.56 mGy. Six C-arm  fluoroscopic images were obtained intraoperatively and submitted for post operative interpretation. These images demonstrate postsurgical changes of plate and screw fixation of the distal femoral fracture. Prior right total knee arthroplasty is imaged. Please see the performing provider's procedural report for further detail. IMPRESSION: Intraoperative fluoroscopic images, as detailed above. Electronically Signed   By: Margaretha Sheffield MD   On: 05/10/2020 16:56      Manjinder Breau T. [place]  If 7PM-7AM, please contact night-coverage [email or web address] 05/11/2020, 12:44 PM

## 2020-05-11 NOTE — Anesthesia Postprocedure Evaluation (Signed)
Anesthesia Post Note  Patient: Angela Chambers  Procedure(s) Performed: OPEN REDUCTION INTERNAL FIXATION (ORIF) DISTAL FEMUR FRACTURE (Right Leg Upper)     Patient location during evaluation: PACU Anesthesia Type: General and Regional Level of consciousness: awake and alert Pain management: pain level controlled Vital Signs Assessment: post-procedure vital signs reviewed and stable Respiratory status: spontaneous breathing, nonlabored ventilation, respiratory function stable and patient connected to nasal cannula oxygen Cardiovascular status: blood pressure returned to baseline and stable Postop Assessment: no apparent nausea or vomiting Anesthetic complications: no   No complications documented.  Last Vitals:  Vitals:   05/11/20 0327 05/11/20 0813  BP: (!) 121/59 140/71  Pulse: 77 86  Resp: 16 17  Temp: 36.6 C 36.4 C  SpO2: 96% 99%    Last Pain:  Vitals:   05/11/20 0813  TempSrc: Oral  PainSc:                  [place] S

## 2020-05-12 ENCOUNTER — Encounter (HOSPITAL_COMMUNITY): Payer: Self-pay | Admitting: Internal Medicine

## 2020-05-12 ENCOUNTER — Other Ambulatory Visit (HOSPITAL_COMMUNITY): Payer: Medicare Other

## 2020-05-12 ENCOUNTER — Inpatient Hospital Stay (HOSPITAL_COMMUNITY): Payer: Medicare Other

## 2020-05-12 DIAGNOSIS — R338 Other retention of urine: Secondary | ICD-10-CM

## 2020-05-12 DIAGNOSIS — D649 Anemia, unspecified: Secondary | ICD-10-CM | POA: Diagnosis not present

## 2020-05-12 HISTORY — DX: Anemia, unspecified: D64.9

## 2020-05-12 LAB — URINE CULTURE: Culture: NO GROWTH

## 2020-05-12 LAB — CBC
HCT: 21.5 % — ABNORMAL LOW (ref 36.0–46.0)
Hemoglobin: 7.7 g/dL — ABNORMAL LOW (ref 12.0–15.0)
MCH: 29.7 pg (ref 26.0–34.0)
MCHC: 35.8 g/dL (ref 30.0–36.0)
MCV: 83 fL (ref 80.0–100.0)
Platelets: 224 10*3/uL (ref 150–400)
RBC: 2.59 MIL/uL — ABNORMAL LOW (ref 3.87–5.11)
RDW: 14.5 % (ref 11.5–15.5)
WBC: 7.3 10*3/uL (ref 4.0–10.5)
nRBC: 0 % (ref 0.0–0.2)

## 2020-05-12 LAB — RENAL FUNCTION PANEL
Albumin: 2.6 g/dL — ABNORMAL LOW (ref 3.5–5.0)
Anion gap: 8 (ref 5–15)
BUN: 14 mg/dL (ref 8–23)
CO2: 26 mmol/L (ref 22–32)
Calcium: 8.7 mg/dL — ABNORMAL LOW (ref 8.9–10.3)
Chloride: 105 mmol/L (ref 98–111)
Creatinine, Ser: 1.02 mg/dL — ABNORMAL HIGH (ref 0.44–1.00)
GFR, Estimated: 56 mL/min — ABNORMAL LOW (ref >60)
Glucose, Bld: 210 mg/dL — ABNORMAL HIGH (ref 70–99)
Phosphorus: 3.3 mg/dL (ref 2.5–4.6)
Potassium: 3.5 mmol/L (ref 3.5–5.1)
Sodium: 139 mmol/L (ref 135–145)

## 2020-05-12 LAB — GLUCOSE, CAPILLARY
Glucose-Capillary: 121 mg/dL — ABNORMAL HIGH (ref 70–99)
Glucose-Capillary: 219 mg/dL — ABNORMAL HIGH (ref 70–99)
Glucose-Capillary: 214 mg/dL — ABNORMAL HIGH (ref 70–99)
Glucose-Capillary: 148 mg/dL — ABNORMAL HIGH (ref 70–99)

## 2020-05-12 LAB — HEMOGLOBIN AND HEMATOCRIT, BLOOD
HCT: 23.6 % — ABNORMAL LOW (ref 36.0–46.0)
Hemoglobin: 8.1 g/dL — ABNORMAL LOW (ref 12.0–15.0)

## 2020-05-12 LAB — MAGNESIUM: Magnesium: 2.5 mg/dL — ABNORMAL HIGH (ref 1.7–2.4)

## 2020-05-12 LAB — OCCULT BLOOD X 1 CARD TO LAB, STOOL: Fecal Occult Bld: NEGATIVE

## 2020-05-12 LAB — PREPARE RBC (CROSSMATCH)

## 2020-05-12 MED ORDER — INSULIN ASPART 100 UNIT/ML ~~LOC~~ SOLN
0.0000 [IU] | Freq: Three times a day (TID) | SUBCUTANEOUS | Status: DC
  Administered 2020-05-12: 18:00:00 5 [IU] via SUBCUTANEOUS
  Administered 2020-05-13 (×2): 3 [IU] via SUBCUTANEOUS
  Administered 2020-05-14: 13:00:00 5 [IU] via SUBCUTANEOUS

## 2020-05-12 MED ORDER — CHLORHEXIDINE GLUCONATE CLOTH 2 % EX PADS
6.0000 | MEDICATED_PAD | Freq: Every day | CUTANEOUS | Status: DC
  Administered 2020-05-12 – 2020-05-14 (×3): 6 via TOPICAL

## 2020-05-12 MED ORDER — HYDROCODONE-ACETAMINOPHEN 5-325 MG PO TABS
1.0000 | ORAL_TABLET | Freq: Four times a day (QID) | ORAL | Status: DC | PRN
  Administered 2020-05-14 (×2): 1 via ORAL
  Filled 2020-05-12 (×2): qty 1

## 2020-05-12 MED ORDER — FUROSEMIDE 10 MG/ML IJ SOLN
20.0000 mg | Freq: Once | INTRAMUSCULAR | Status: AC
Start: 2020-05-12 — End: 2020-05-12
  Administered 2020-05-12: 18:00:00 20 mg via INTRAVENOUS
  Filled 2020-05-12: qty 2

## 2020-05-12 MED ORDER — HYDROMORPHONE HCL 1 MG/ML IJ SOLN: 0.5000 mg | INTRAMUSCULAR | Status: DC | PRN

## 2020-05-12 MED ORDER — INSULIN ASPART 100 UNIT/ML ~~LOC~~ SOLN
4.0000 [IU] | Freq: Three times a day (TID) | SUBCUTANEOUS | Status: DC
  Administered 2020-05-12 – 2020-05-14 (×3): 4 [IU] via SUBCUTANEOUS

## 2020-05-12 MED ORDER — INSULIN ASPART 100 UNIT/ML ~~LOC~~ SOLN: 0.0000 [IU] | Freq: Every day | SUBCUTANEOUS | Status: DC

## 2020-05-12 MED ORDER — SODIUM CHLORIDE 0.9% IV SOLUTION: Freq: Once | INTRAVENOUS | Status: DC

## 2020-05-12 NOTE — Progress Notes (Signed)
Inpatient Rehab Admissions Coordinator Note:   Per therapy recommendations, pt was screened for CIR candidacy by Clemens Catholic, [place] CCC-SLP. At this time, Pt. Appears to have functional decline but does not demonstrate medical necessity for CIR admission. I will not pursue rehab consult order at this time. Pt. May benefit from STR at SNF before returning home. Please contact me with questions.   Clemens Catholic, [place], [place] Admissions Coordinator  [phone] ([place]) [phone] (office)

## 2020-05-12 NOTE — Progress Notes (Addendum)
PROGRESS NOTE  Angela Chambers TKZ:[id number] DOB: 05/06/1941   PCP: Horald Pollen, MD  Patient is from: Home  DOA: 05/09/2020 LOS: 3  Chief complaints: Right hip pain after fall  Brief Narrative / Interim history: 79 year old female with history of DM-2, HTN, HLD, nonobstructive CAD and right TKA  brought to ED after she missed a step and fell at her cardiologist office.  Denies hitting her head or LOC.  No prodromes.  In ED, hemodynamically stable.  Labs without significant finding.  X-ray revealed comminuted displaced and angulated distal right femoral metaphyseal fracture extending to the knee prosthesis.  Right hand x-ray with questionable tiny erosion of the fourth MCP joint, questionable torn scapholunate ligament and scattered degenerative changes.    Patient underwent ORIF of distal femoral fracture by Dr. Doreatha Martin on 05/10/2020.  Therapy recommended CIR.  CIR suggested SNF.  Patient has acute urinary retention with bladder over distention.  She had about 1 L UOP on I&O cath likely due to opiates.  Renal function seems to be stable.  Renal ultrasound ordered.  Indwelling Foley catheter placed  Subjective: Seen and examined earlier this morning.  No major events overnight of this morning.  She has to had another I&O cath last night.  Has not been able to void on his own.  No other complaints.  She denies chest pain, dyspnea or GI symptoms.  Objective: Vitals:   05/11/20 1440 05/11/20 2034 05/12/20 0500 05/12/20 0805  BP: 138/63 100/60 107/68 (!) 132/53  Pulse: 77 83 80 82  Resp: 17 18 17 17   Temp: 97.7 F (36.5 C) 98.8 F (37.1 C) 97.8 F (36.6 C) 97.7 F (36.5 C)  TempSrc: Oral  Oral Oral  SpO2: 96% 100% 100% 91%  Weight:      Height:        Intake/Output Summary (Last 24 hours) at 05/12/2020 1221 Last data filed at 05/12/2020 0900 Gross per 24 hour  Intake 1826.79 ml  Output 600 ml  Net 1226.79 ml   Filed Weights   05/09/20 1857 05/10/20 1211   Weight: 86.2 kg 86.2 kg    Examination:  GENERAL: No apparent distress.  Nontoxic. HEENT: MMM.  Vision and hearing grossly intact.  NECK: Supple.  No apparent JVD.  RESP:  No IWOB.  Fair aeration bilaterally. CVS:  RRR. Heart sounds normal.  ABD/GI/GU: BS+. Abd soft, NTND.  MSK/EXT:  Moves extremities.  No obvious hematoma or ecchymosis. SKIN: Dressing over lateral aspect of right leg DCI. NEURO: Awake, alert and oriented appropriately.  No apparent focal neuro deficit. PSYCH: Calm. Normal affect.   Procedures:  05/10/2020-ORIF of distal femoral fracture by Dr. Doreatha Martin  Microbiology summarized: [id number] and influenza PCR nonreactive.  Assessment & Plan: Accidental fall from standing height-missed the step and fell at her cardiologist office.  No prodromes. Traumatic osteoporotic fracture Right comminuted and displaced distal femoral fracture due to accidental fall -Vitamin D within normal. -S/p ORIF by Dr. Doreatha Martin on 05/10/2020. -Scheduled Tylenol to minimize use of opiates given urinary retention. -WBAT on RLE.   -PT/OT recommended CIR but CIR suggested SNF.  Controlled NIDDM-2 with hyperglycemia: A1c 6.4%.  On Metformin and glipizide at home. Recent Labs  Lab 05/11/20 1133 05/11/20 1609 05/11/20 2217 05/12/20 0831 05/12/20 1211  GLUCAP 209* 147* 210* 121* 219*  -Increase SSI to moderate -Increase NovoLog from 3 to 4 units [facility] -Further adjustment as appropriate. -Continue atorvastatin  Essential hypertension: Normotensive -Continue home amlodipine -Resume home losartan. -Continue home metoprolol XL  50 mg daily.  Nonobstructive CAD: Stable.  No anginal symptoms. -Continue home medications as above.  Acute urinary retention with bladder over distention: Over 1 L UOP on I&O cath on 12/11.  Urinalysis not impressive. -Place indwelling Foley catheter -Monitor renal function -Renal ultrasound -Minimize use of opiates -Low-dose  bethanechol  Hyperlipidemia -Continue statin.  Hypokalemia: Resolved.  Iron deficiency anemia: slight drop in Hgb, dilutional pattern with all cell lines down.  Denies melena or hematochezia.  Hemoccult negative x2.  Anemia panel suggests IDA.  Iron sat 6%.  TIBC 307.  Ferritin 68. Recent Labs    05/09/20 1555 05/09/20 2131 05/10/20 0852 05/11/20 0246 05/12/20 0404 05/12/20 0849  HGB 11.2* 10.9* 9.4* 8.4* 7.7* 8.1*  -IV Feraheme on 05/11/2020 -Monitor H&H.  Class I obesity Body mass index is 31.62 kg/m. Nutrition Problem: Increased nutrient needs Etiology: post-op healing-Encourage lifestyle change to lose weight. -Could benefit from GLP-1 inhibitors. Signs/Symptoms: estimated needs Interventions: Glucerna shake,MVI   DVT prophylaxis:  enoxaparin (LOVENOX) injection 40 mg Start: 05/11/20 0900 SCDs Start: 05/09/20 1522  Code Status: Full code Family Communication: Updated patient's husband at bedside. Status is: Inpatient  Remains inpatient appropriate because:Unsafe d/c plan and Inpatient level of care appropriate due to severity of illness   Dispo: The patient is from: Home              Anticipated d/c is to: SNF              Anticipated d/c date is: 2 days              Patient currently is not medically stable to d/c.       Consultants:  Orthopedic surgery   Sch Meds:  Scheduled Meds:  sodium chloride   Intravenous Once   acetaminophen  1,000 mg Oral Q8H   amLODipine  5 mg Oral Daily   atorvastatin  20 mg Oral Daily   bethanechol  10 mg Oral TID   brimonidine  1 drop Both Eyes BID   Chlorhexidine Gluconate Cloth  6 each Topical Daily   cholecalciferol  5,000 Units Oral Daily   dorzolamide-timolol  1 drop Both Eyes BID   enoxaparin (LOVENOX) injection  40 mg Subcutaneous Q24H   feeding supplement (GLUCERNA SHAKE)  237 mL Oral TID BM   furosemide  20 mg Intravenous Once   gabapentin  100 mg Oral TID   insulin aspart  0-15 Units Subcutaneous TID WC    insulin aspart  0-5 Units Subcutaneous QHS   insulin aspart  4 Units Subcutaneous TID WC   insulin glargine  25 Units Subcutaneous QHS   iron polysaccharides  150 mg Oral BID   latanoprost  1 drop Both Eyes QHS   losartan  50 mg Oral Daily   metoprolol succinate  50 mg Oral Daily   multivitamin with minerals  1 tablet Oral Daily   mupirocin ointment   Nasal BID   Continuous Infusions:   PRN Meds:.alum & mag hydroxide-simeth, HYDROcodone-acetaminophen, HYDROmorphone (DILAUDID) injection, methocarbamol, ondansetron (ZOFRAN) IV, polyethylene glycol  Antimicrobials: Anti-infectives (From admission, onward)    Start     Dose/Rate Route Frequency Ordered Stop   05/10/20 2200  ceFAZolin (ANCEF) IVPB 2g/100 mL premix        2 g 200 mL/hr over 30 Minutes Intravenous Every 8 hours 05/10/20 1650 05/11/20 1720   05/10/20 1339  vancomycin (VANCOCIN) powder  Status:  Discontinued          As needed 05/10/20  1339 05/10/20 1429   05/10/20 0600  ceFAZolin (ANCEF) IVPB 2g/100 mL premix        2 g 200 mL/hr over 30 Minutes Intravenous On call to O.R. 05/09/20 1521 05/10/20 1325        I have personally reviewed the following labs and images: CBC: Recent Labs  Lab 05/09/20 1555 05/09/20 2131 05/10/20 0852 05/11/20 0246 05/12/20 0404 05/12/20 0849  WBC 8.3 8.3 5.1 7.0 7.3  --   NEUTROABS 6.0  --   --   --   --   --   HGB 11.2* 10.9* 9.4* 8.4* 7.7* 8.1*  HCT 31.6* 31.5* 26.8* 22.9* 21.5* 23.6*  MCV 84.7 83.3 84.0 82.7 83.0  --   PLT 319 314 251 230 224  --    BMP &GFR Recent Labs  Lab 05/09/20 1555 05/09/20 2131 05/10/20 0852 05/11/20 0246 05/12/20 0404  NA 137 139 136 136 139  K 4.2 3.9 3.4* 4.3 3.5  CL 103 102 100 103 105  CO2 25 25 25 24 26   GLUCOSE 199* 222* 187* 257* 210*  BUN 20 24* 23 18 14   CREATININE 0.75 1.09* 0.94 0.91 1.02*  CALCIUM 9.1 9.3 8.7* 8.5* 8.7*  MG  --   --  1.9 2.1 2.5*  PHOS  --   --  3.7 2.9 3.3   Estimated Creatinine Clearance: 48.5 mL/min  (A) (by C-G formula based on SCr of 1.02 mg/dL (H)). Liver & Pancreas: Recent Labs  Lab 05/10/20 0852 05/11/20 0246 05/12/20 0404  ALBUMIN 3.1* 3.1* 2.6*   No results for input(s): LIPASE, AMYLASE in the last 168 hours. No results for input(s): AMMONIA in the last 168 hours. Diabetic: Recent Labs    05/09/20 1555  HGBA1C 6.4*   Recent Labs  Lab 05/11/20 1133 05/11/20 1609 05/11/20 2217 05/12/20 0831 05/12/20 1211  GLUCAP 209* 147* 210* 121* 219*   Cardiac Enzymes: Recent Labs  Lab 05/10/20 0852  CKTOTAL 56   No results for input(s): PROBNP in the last 8760 hours. Coagulation Profile: No results for input(s): INR, PROTIME in the last 168 hours. Thyroid Function Tests: No results for input(s): TSH, T4TOTAL, FREET4, T3FREE, THYROIDAB in the last 72 hours. Lipid Profile: No results for input(s): CHOL, HDL, LDLCALC, TRIG, CHOLHDL, LDLDIRECT in the last 72 hours. Anemia Panel: Recent Labs    05/11/20 0246  VITAMINB12 361  FOLATE 19.9  FERRITIN 68  TIBC 307  IRON 18*  RETICCTPCT 1.9   Urine analysis:    Component Value Date/Time   COLORURINE YELLOW 05/11/2020 1900   APPEARANCEUR CLEAR 05/11/2020 1900   APPEARANCEUR Clear 02/07/2018 1135   LABSPEC 1.013 05/11/2020 1900   PHURINE 5.0 05/11/2020 1900   GLUCOSEU >=500 (A) 05/11/2020 1900   HGBUR NEGATIVE 05/11/2020 1900   BILIRUBINUR NEGATIVE 05/11/2020 1900   BILIRUBINUR Negative 02/07/2018 1135   KETONESUR NEGATIVE 05/11/2020 1900   PROTEINUR NEGATIVE 05/11/2020 1900   UROBILINOGEN 0.2 09/19/2013 1110   NITRITE NEGATIVE 05/11/2020 1900   LEUKOCYTESUR NEGATIVE 05/11/2020 1900   Sepsis Labs: Invalid input(s): PROCALCITONIN, [place]  Microbiology: Recent Results (from the past 240 hour(s))  Resp Panel by RT-PCR (Flu A&B, Covid) Nasopharyngeal Swab     Status: None   Collection Time: 05/09/20  3:55 PM   Specimen: Nasopharyngeal Swab; Nasopharyngeal(NP) swabs in vial transport medium  Result Value Ref  Range Status   SARS Coronavirus 2 by RT PCR NEGATIVE NEGATIVE Final    Comment: (NOTE) SARS-CoV-2 target nucleic acids are NOT DETECTED.  The SARS-CoV-2 RNA is generally detectable in upper respiratory specimens during the acute phase of infection. The lowest concentration of SARS-CoV-2 viral copies this assay can detect is 138 copies/mL. A negative result does not preclude SARS-Cov-2 infection and should not be used as the sole basis for treatment or other patient management decisions. A negative result may occur with  improper specimen collection/handling, submission of specimen other than nasopharyngeal swab, presence of viral mutation(s) within the areas targeted by this assay, and inadequate number of viral copies(<138 copies/mL). A negative result must be combined with clinical observations, patient history, and epidemiological information. The expected result is Negative.  Fact Sheet for Patients:  EntrepreneurPulse.com.au  Fact Sheet for Healthcare Providers:  IncredibleEmployment.be  This test is no t yet approved or cleared by the [place] FDA and  has been authorized for detection and/or diagnosis of SARS-CoV-2 by FDA under an Emergency Use Authorization (EUA). This EUA will remain  in effect (meaning this test can be used) for the duration of the COVID-19 declaration under Section 564(b)(1) of the Act, 21 U.S.C.section 360bbb-3(b)(1), unless the authorization is terminated  or revoked sooner.       Influenza A by PCR NEGATIVE NEGATIVE Final   Influenza B by PCR NEGATIVE NEGATIVE Final    Comment: (NOTE) The Xpert Xpress SARS-CoV-2/FLU/RSV plus assay is intended as an aid in the diagnosis of influenza from Nasopharyngeal swab specimens and should not be used as a sole basis for treatment. Nasal washings and aspirates are unacceptable for Xpert Xpress SARS-CoV-2/FLU/RSV testing.  Fact Sheet for  Patients: EntrepreneurPulse.com.au  Fact Sheet for Healthcare Providers: IncredibleEmployment.be  This test is not yet approved or cleared by the [place] FDA and has been authorized for detection and/or diagnosis of SARS-CoV-2 by FDA under an Emergency Use Authorization (EUA). This EUA will remain in effect (meaning this test can be used) for the duration of the COVID-19 declaration under Section 564(b)(1) of the Act, 21 U.S.C. section 360bbb-3(b)(1), unless the authorization is terminated or revoked.  Performed at [facility], [place] [address], [place], [place] [address]   Surgical pcr screen     Status: Abnormal   Collection Time: 05/09/20 10:34 PM   Specimen: Nasal Mucosa; Nasal Swab  Result Value Ref Range Status   MRSA, PCR NEGATIVE NEGATIVE Final   Staphylococcus aureus POSITIVE (A) NEGATIVE Final    Comment: (NOTE) The Xpert SA Assay (FDA approved for NASAL specimens in patients 62 years of age and older), is one component of a comprehensive surveillance program. It is not intended to diagnose infection nor to guide or monitor treatment. Performed at [facility], [place] [address] [place], [place] [address]     Radiology Studies: No results found.    Aquanetta Schwarz T. [place]  If 7PM-7AM, please contact night-coverage [email or web address] 05/12/2020, 12:21 PM

## 2020-05-12 NOTE — Progress Notes (Signed)
Subjective: 2 Days Post-Op Procedure(s) (LRB): OPEN REDUCTION INTERNAL FIXATION (ORIF) DISTAL FEMUR FRACTURE (Right) Patient reports pain as 4 on 0-10 scale.    Objective: Vital signs in last 24 hours: Temp:  [97.7 F (36.5 C)-98.8 F (37.1 C)] 97.7 F (36.5 C) (12/12 0805) Pulse Rate:  [77-83] 82 (12/12 0805) Resp:  [17-18] 17 (12/12 0805) BP: (100-138)/(53-68) 132/53 (12/12 0805) SpO2:  [91 %-100 %] 91 % (12/12 0805)  Intake/Output from previous day: 12/11 0701 - 12/12 0700 In: 1826.8 [P.O.:680; I.V.:946.8; IV Piggyback:200] Out: 600 [Urine:600] Intake/Output this shift: Total I/O In: 240 [P.O.:240] Out: -   Recent Labs    05/09/20 2131 05/10/20 0852 05/11/20 0246 05/12/20 0404 05/12/20 0849  HGB 10.9* 9.4* 8.4* 7.7* 8.1*   Recent Labs    05/11/20 0246 05/12/20 0404 05/12/20 0849  WBC 7.0 7.3  --   RBC 2.77*  2.86* 2.59*  --   HCT 22.9* 21.5* 23.6*  PLT 230 224  --    Recent Labs    05/11/20 0246 05/12/20 0404  NA 136 139  K 4.3 3.5  CL 103 105  CO2 24 26  BUN 18 14  CREATININE 0.91 1.02*  GLUCOSE 257* 210*  CALCIUM 8.5* 8.7*   No results for input(s): LABPT, INR in the last 72 hours.  ABD soft Neurovascular intact patient was light headed and dizzy after ambulation   Assessment/Plan: 2 Days Post-Op Procedure(s) (LRB): OPEN REDUCTION INTERNAL FIXATION (ORIF) DISTAL FEMUR FRACTURE (Right)  Principal Problem:   Femur fracture (HCC) Active Problems:   Dyslipidemia associated with type 2 diabetes mellitus ([place])   Hypertension associated with diabetes ([place])   Symptomatic anemia post operative blood loss  Advance diet Transfuse 2 units of PRBc with 20 mg lasix IV between units   Agree with inpatient rehab   Patient did much better today getting from bed to chair.    [place] Kreitzer J Deandrea Rion 05/12/2020, 12:14 PM

## 2020-05-12 NOTE — Plan of Care (Signed)

## 2020-05-13 ENCOUNTER — Encounter (HOSPITAL_COMMUNITY): Payer: Self-pay | Admitting: Student

## 2020-05-13 LAB — TYPE AND SCREEN
ABO/RH(D): O POS
Antibody Screen: NEGATIVE
Unit division: 0
Unit division: 0

## 2020-05-13 LAB — CBC
HCT: 31.5 % — ABNORMAL LOW (ref 36.0–46.0)
Hemoglobin: 11 g/dL — ABNORMAL LOW (ref 12.0–15.0)
MCH: 28.7 pg (ref 26.0–34.0)
MCHC: 34.9 g/dL (ref 30.0–36.0)
MCV: 82.2 fL (ref 80.0–100.0)
Platelets: 258 10*3/uL (ref 150–400)
RBC: 3.83 MIL/uL — ABNORMAL LOW (ref 3.87–5.11)
RDW: 14.5 % (ref 11.5–15.5)
WBC: 9.7 10*3/uL (ref 4.0–10.5)
nRBC: 0.3 % — ABNORMAL HIGH (ref 0.0–0.2)

## 2020-05-13 LAB — BPAM RBC
Blood Product Expiration Date: 202201132359
Blood Product Expiration Date: 202201132359
ISSUE DATE / TIME: 202112121444
ISSUE DATE / TIME: 202112121758
Unit Type and Rh: 5100
Unit Type and Rh: 5100

## 2020-05-13 LAB — RENAL FUNCTION PANEL
Albumin: 2.8 g/dL — ABNORMAL LOW (ref 3.5–5.0)
Anion gap: 12 (ref 5–15)
BUN: 14 mg/dL (ref 8–23)
CO2: 24 mmol/L (ref 22–32)
Calcium: 9.2 mg/dL (ref 8.9–10.3)
Chloride: 101 mmol/L (ref 98–111)
Creatinine, Ser: 0.81 mg/dL (ref 0.44–1.00)
GFR, Estimated: >60 mL/min (ref >60)
Glucose, Bld: 145 mg/dL — ABNORMAL HIGH (ref 70–99)
Phosphorus: 3.5 mg/dL (ref 2.5–4.6)
Potassium: 3.9 mmol/L (ref 3.5–5.1)
Sodium: 137 mmol/L (ref 135–145)

## 2020-05-13 LAB — GLUCOSE, CAPILLARY
Glucose-Capillary: 149 mg/dL — ABNORMAL HIGH (ref 70–99)
Glucose-Capillary: 157 mg/dL — ABNORMAL HIGH (ref 70–99)
Glucose-Capillary: 176 mg/dL — ABNORMAL HIGH (ref 70–99)
Glucose-Capillary: 188 mg/dL — ABNORMAL HIGH (ref 70–99)

## 2020-05-13 LAB — MAGNESIUM: Magnesium: 2.5 mg/dL — ABNORMAL HIGH (ref 1.7–2.4)

## 2020-05-13 MED ORDER — HYDROCODONE-ACETAMINOPHEN 5-325 MG PO TABS: 1.0000 | ORAL_TABLET | Freq: Four times a day (QID) | ORAL | 0 refills | Status: DC | PRN

## 2020-05-13 MED ORDER — ENOXAPARIN SODIUM 40 MG/0.4ML ~~LOC~~ SOLN: 40.0000 mg | SUBCUTANEOUS | 0 refills | Status: DC

## 2020-05-13 MED ORDER — PANTOPRAZOLE SODIUM 40 MG PO TBEC
40.0000 mg | DELAYED_RELEASE_TABLET | Freq: Every day | ORAL | Status: DC
Start: 2020-05-13 — End: 2020-05-15
  Administered 2020-05-13 – 2020-05-14 (×2): 40 mg via ORAL
  Filled 2020-05-13 (×2): qty 1

## 2020-05-13 NOTE — TOC CAGE-AID Note (Signed)
Transition of Care [facility]) - CAGE-AID Screening   Patient Details  Name: Angela Chambers MRN: [id number] Date of Birth: [birth year]  Transition of [facility]) CM/SW Contact:    Curlene Labrum, RN Phone Number: 05/13/2020, 4:10 PM   Clinical Narrative: Patient with no history of ETOH, drugs, no smoking.   CAGE-AID Screening: Substance Abuse Screening unable to be completed due to: :  (N/A)  Have You Ever Felt You Ought to Cut Down on Your Drinking or Drug Use?: No Have People Annoyed You By Critizing Your Drinking Or Drug Use?: No Have You Felt Bad Or Guilty About Your Drinking Or Drug Use?: No Have You Ever Had a Drink or Used Drugs First Thing In The Morning to Steady Your Nerves or to Get Rid of a Hangover?: No CAGE-AID Score: 0  Substance Abuse Education Offered: No

## 2020-05-13 NOTE — Discharge Instructions (Signed)
Orthopaedic Trauma Service Discharge Instructions   General Discharge Instructions  WEIGHT BEARING STATUS: Weightbearing as tolerated right lower extremity  RANGE OF MOTION/ACTIVITY: Ok for knee motion as tolerated  Wound Care: Incisions can be left open to air if there is no drainage. If incision continues to have drainage, follow wound care instructions below. Okay to shower if no drainage from incisions.  DVT/PE prophylaxis: Lovenox x 28 days  Diet: as you were eating previously.  Can use over the counter stool softeners and bowel preparations, such as Miralax, to help with bowel movements.  Narcotics can be constipating.  Be sure to drink plenty of fluids  PAIN MEDICATION USE AND EXPECTATIONS  You have likely been given narcotic medications to help control your pain.  After a traumatic event that results in an fracture (broken bone) with or without surgery, it is ok to use narcotic pain medications to help control one's pain.  We understand that everyone responds to pain differently and each individual patient will be evaluated on a regular basis for the continued need for narcotic medications. Ideally, narcotic medication use should last no more than 6-8 weeks (coinciding with fracture healing).   As a patient it is your responsibility as well to monitor narcotic medication use and report the amount and frequency you use these medications when you come to your office visit.   We would also advise that if you are using narcotic medications, you should take a dose prior to therapy to maximize you participation.  IF YOU ARE ON NARCOTIC MEDICATIONS IT IS NOT PERMISSIBLE TO OPERATE A MOTOR VEHICLE (MOTORCYCLE/CAR/TRUCK/MOPED) OR HEAVY MACHINERY DO NOT MIX NARCOTICS WITH OTHER CNS (CENTRAL NERVOUS SYSTEM) DEPRESSANTS SUCH AS ALCOHOL   STOP SMOKING OR USING NICOTINE PRODUCTS!!!!  As discussed nicotine severely impairs your body's ability to heal surgical and traumatic wounds but also  impairs bone healing.  Wounds and bone heal by forming microscopic blood vessels (angiogenesis) and nicotine is a vasoconstrictor (essentially, shrinks blood vessels).  Therefore, if vasoconstriction occurs to these microscopic blood vessels they essentially disappear and are unable to deliver necessary nutrients to the healing tissue.  This is one modifiable factor that you can do to dramatically increase your chances of healing your injury.    (This means no smoking, no nicotine gum, patches, etc)  DO NOT USE NONSTEROIDAL ANTI-INFLAMMATORY DRUGS (NSAID'S)  Using products such as Advil (ibuprofen), Aleve (naproxen), Motrin (ibuprofen) for additional pain control during fracture healing can delay and/or prevent the healing response.  If you would like to take over the counter (OTC) medication, Tylenol (acetaminophen) is ok.  However, some narcotic medications that are given for pain control contain acetaminophen as well. Therefore, you should not exceed more than 4000 mg of tylenol in a day if you do not have liver disease.  Also note that there are may OTC medicines, such as cold medicines and allergy medicines that my contain tylenol as well.  If you have any questions about medications and/or interactions please ask your doctor/PA or your pharmacist.      ICE AND ELEVATE INJURED/OPERATIVE EXTREMITY  Using ice and elevating the injured extremity above your heart can help with swelling and pain control.  Icing in a pulsatile fashion, such as 20 minutes on and 20 minutes off, can be followed.    Do not place ice directly on skin. Make sure there is a barrier between to skin and the ice pack.    Using frozen items such as frozen peas  works well as the conform nicely to the are that needs to be iced.  USE AN ACE WRAP OR TED HOSE FOR SWELLING CONTROL  In addition to icing and elevation, Ace wraps or TED hose are used to help limit and resolve swelling.  It is recommended to use Ace wraps or TED hose until  you are informed to stop.    When using Ace Wraps start the wrapping distally (farthest away from the body) and wrap proximally (closer to the body)   Example: If you had surgery on your leg or thing and you do not have a splint on, start the ace wrap at the toes and work your way up to the thigh        If you had surgery on your upper extremity and do not have a splint on, start the ace wrap at your fingers and work your way up to the upper arm   [place]: [phone]   VISIT OUR WEBSITE FOR ADDITIONAL INFORMATION: [email or web address]    Discharge Wound Care Instructions  Do NOT apply any ointments, solutions or lotions to pin sites or surgical wounds.  These prevent needed drainage and even though solutions like hydrogen peroxide kill bacteria, they also damage cells lining the pin sites that help fight infection.  Applying lotions or ointments can keep the wounds moist and can cause them to breakdown and open up as well. This can increase the risk for infection. When in doubt call the office.  Surgical incisions should be dressed daily.  If any drainage is noted, use one layer of adaptic, then gauze, Kerlix, and an ace wrap.  Once the incision is completely dry and without drainage, it may be left open to air out.  Showering may begin 36-48 hours later.  Cleaning gently with soap and water.  Traumatic wounds should be dressed daily as well.    One layer of adaptic, gauze, Kerlix, then ace wrap.  The adaptic can be discontinued once the draining has ceased    If you have a wet to dry dressing: wet the gauze with saline the squeeze as much saline out so the gauze is moist (not soaking wet), place moistened gauze over wound, then place a dry gauze over the moist one, followed by Kerlix wrap, then ace wrap.

## 2020-05-13 NOTE — Progress Notes (Signed)
Nutrition Follow-up  DOCUMENTATION CODES:   Obesity unspecified  INTERVENTION:   -D/c Glucerna Shake po TID, each supplement provides 220 kcal and 10 grams of protein -Continue MVI with minerals daily  NUTRITION DIAGNOSIS:   Increased nutrient needs related to post-op healing as evidenced by estimated needs.  Ongoing  GOAL:   Patient will meet greater than or equal to 90% of their needs  Progressing   MONITOR:   PO intake,Supplement acceptance,Labs,Weight trends,Skin,I & O's  REASON FOR ASSESSMENT:   Consult Assessment of nutrition requirement/status,Hip fracture protocol  ASSESSMENT:   This is a 79 year old female with past medical history of hypertension, diabetes, hyperlipidemia, osteoporosis who presented to the ED after a trip and fall at her doctor's office today. Patient was walking into the cardiology office for a routine yearly appointment and she missed a step, tripped and fell. She did not hit her head and denies any loss of consciousness and is not on any blood thinners. No headache, nausea or vomiting or dizziness.  Does have some right knee pain but otherwise no complaints  12/10- s/p Open reduction internal fixation of right distal femur fracture  Reviewed I/O's: +744 ml x 24 hours and +2.3 L since admission  UOP: 650 ml x 24 hours  Spoke with pt at bedside, who was pleasant and in good spirits. She shares that she vomited earlier today and did not eat breakfast because of it. She feels better now and feels ready to order her lunch. She is hopeful that this is the end of her GI symptoms.   PTA, pt reports great appetite, consuming 2-3 meals per day. Pt typically consumes a meat, starch, and vegetable at meals. Observed meal trays outside of the room and pt consumed about 75% of meals. Documented meal completion 50-100%.   Pt does not like Glucerna supplements. RD discussed importance of good meal intake to promote healing.   Medications reviewed and  include vitamin D3.  Labs reviewed: CBGS: 148-157 (inpatient orders for glycemic control are 0-15 units insulin aspart TID with meals, 0-5 units insulin aspart daily at bedtime, 4 units insulin aspart TID with meals, and 25 units insulin glargine daily at bedtme).   NUTRITION - FOCUSED PHYSICAL EXAM:  Flowsheet Row Most Recent Value  Orbital Region No depletion  Upper Arm Region No depletion  Thoracic and Lumbar Region No depletion  Buccal Region No depletion  Temple Region No depletion  Clavicle Bone Region No depletion  Clavicle and Acromion Bone Region No depletion  Scapular Bone Region No depletion  Dorsal Hand No depletion  Patellar Region No depletion  Anterior Thigh Region No depletion  Posterior Calf Region No depletion  Edema (RD Assessment) Mild  Hair Reviewed  Eyes Reviewed  Mouth Reviewed  Skin Reviewed  Nails Reviewed       Diet Order:   Diet Order            Diet heart healthy/carb modified Room service appropriate? Yes; Fluid consistency: Thin  Diet effective now                 EDUCATION NEEDS:   No education needs have been identified at this time  Skin:  Skin Assessment: Skin Integrity Issues: Skin Integrity Issues:: Incisions Incisions: closed rt thigh  Last BM:  05/12/20  Height:   Ht Readings from Last 1 Encounters:  05/10/20 5\' 5"  (1.651 m)    Weight:   Wt Readings from Last 1 Encounters:  05/10/20 86.2 kg  Ideal Body Weight:  56.8 kg  BMI:  Body mass index is 31.62 kg/m.  Estimated Nutritional Needs:   Kcal:  1800-2000  Protein:  100-115 grams  Fluid:  > 1.8 L    Angela Chambers, RD, LDN, [place] Registered Dietitian II Certified Diabetes Care and Education Specialist Please refer to [facility] for RD and/or RD on-call/weekend/after hours pager

## 2020-05-13 NOTE — TOC Initial Note (Signed)
Transition of Care [facility]) - Initial/Assessment Note    Patient Details  Name: Angela Chambers MRN: [id number] Date of Birth: 06-14-1940  Transition of [facility]) CM/SW Contact:    Curlene Labrum, RN Phone Number: 05/13/2020, 4:10 PM  Clinical Narrative:                 Case management met with the patient at the bedside regarding transitions of care to home with husband, S/P fall and right distal femur fracture - periprosthetic.  The patient plans to discharge home with home health services and she was given Medicare choice regarding home health and she did not have a preference.  I called Tommi Rumps, RNCM with [facility] and they have accepted the patient for admission for home health PT/Ot services.  CAGE assessment done R/T fall at home.  No educational material given to patient since assessment for negative.  The patient has history of right TKA - and husband has 3:1 at home and is checking to find the patient's RW.  He will let me know if a rolling walker is needed prior to discharge - this can be obtained from adapt stock on the floor if needed.  The patient currently has a foley due to urinary retention along with constipation issues after surgery.  Will continue to follow the patient for discharge to home in 1-2 days.  Expected Discharge Plan: [place] Barriers to Discharge: No Barriers Identified   Patient Goals and CMS Choice Patient states their goals for this hospitalization and ongoing recovery are:: Patient plans to discharge home with husband and [facility] services. CMS Medicare.gov Compare Post Acute Care list provided to:: Patient Choice offered to / list presented to : Patient  Expected Discharge Plan and Services Expected Discharge Plan: [place]   Discharge Planning Services: CM Consult Post Acute Care Choice: [place] Living arrangements for the past 2 months: Single Family Home                 DME  Arranged: Walker rolling DME Agency: [place] Arranged: PT,OT [place] Agency: [place] Date [facility] Agency Contacted: 05/13/20 Time [place]: 27 Representative spoke with at [place]: Meredeth Ide  Prior Living Arrangements/Services Living arrangements for the past 2 months: [place] with:: Spouse Patient language and need for interpreter reviewed:: Yes Do you feel safe going back to the place where you live?: Yes      Need for Family Participation in Patient Care: Yes (Comment) Care giver support system in place?: Yes (comment) Current home services: DME (has raised toilet seat and 3:1 at home - looking for RW at home) Criminal Activity/Legal Involvement Pertinent to Current Situation/Hospitalization: No - Comment as needed  Activities of Daily Living Home Assistive Devices/Equipment: Eyeglasses,Dentures (specify type),CBG Meter (upper partial plate) ADL Screening (condition at time of admission) Patient's cognitive ability adequate to safely complete daily activities?: Yes Is the patient deaf or have difficulty hearing?: No Does the patient have difficulty seeing, even when wearing glasses/contacts?: Yes (glaucoma) Does the patient have difficulty concentrating, remembering, or making decisions?: No Patient able to express need for assistance with ADLs?: Yes Does the patient have difficulty dressing or bathing?: Yes Independently performs ADLs?: No Communication: Independent Dressing (OT): Needs assistance Is this a change from baseline?: Change from baseline, expected to last >3 days Grooming: Needs assistance Is this a change from baseline?: Change  from baseline, expected to last >3 days Feeding: Needs assistance Is this a change from baseline?: Change from baseline, expected to last >3 days Bathing: Needs assistance Is this a change from baseline?: Change from baseline, expected to last >3 days Toileting: Dependent Is this a  change from baseline?: Change from baseline, expected to last >3days In/Out Bed: Dependent Is this a change from baseline?: Change from baseline, expected to last >3 days Walks in Home: Dependent Is this a change from baseline?: Change from baseline, expected to last >3 days Does the patient have difficulty walking or climbing stairs?: Yes Weakness of Legs: Right Weakness of Arms/Hands: None  Permission Sought/Granted Permission sought to share information with : Case Manager Permission granted to share information with : Yes, Verbal Permission Granted     Permission granted to share info w AGENCY: [facility] for [facility]  Permission granted to share info w Relationship: spouse, Edd Arbour [phone]     Emotional Assessment Appearance:: Appears stated age Attitude/Demeanor/Rapport: Gracious Affect (typically observed): Accepting Orientation: : Oriented to Self,Oriented to Place,Oriented to  Time,Oriented to Situation Alcohol / Substance Use: Never Used Psych Involvement: No (comment)  Admission diagnosis:  Femur fracture ([place]) [S72.90XA] Contusion of right hip, initial encounter [S70.01XA] Contusion of right hand, initial encounter [S60.221A] Fall, initial encounter [W19.XXXA] Other closed fracture of distal end of right femur, initial encounter [facility]) [S72.491A] Patient Active Problem List   Diagnosis Date Noted  . Symptomatic anemia post operative blood loss 05/12/2020  . Femur fracture ([place]) 05/09/2020  . Hypertension associated with diabetes ([place]) 08/16/2018  . Abnormal stress echo EKG portion with normal echo -> imaging confirmed the correct with normal coronaries 06/21/2015  . OA (osteoarthritis) of knee 09/25/2013  . Obesity (BMI 30.0-34.9)   . Hypertensive heart disease without CHF 11/30/2011  . Hyperlipidemia 08/25/2011  . Other and combined forms of senile cataract 07/04/2011  . Primary open angle glaucoma of both eyes, severe stage 07/04/2011  . Dyslipidemia associated with  type 2 diabetes mellitus ([place]) 02/22/2009  . PERSONAL HISTORY OF COLONIC POLYPS 02/22/2009   PCP:  Horald Pollen, MD Pharmacy:   [facility] [place], [place] - [address] [place] [address] Phone: [phone] Fax: [phone]  [facility] [phone] - [place], [place] - [address] AT [address] [place] [address] Phone: [phone] Fax: [phone]     Social Determinants of Health ([place]) Interventions    Readmission Risk Interventions No flowsheet data found.

## 2020-05-13 NOTE — Progress Notes (Signed)
PROGRESS NOTE  Angela Chambers EPP:[id number] DOB: 04-25-1941   PCP: Horald Pollen, MD  Patient is from: Home  DOA: 05/09/2020 LOS: 4  Chief complaints: Right hip pain after fall  Brief Narrative / Interim history: 79 year old female with history of DM-2, HTN, HLD, nonobstructive CAD and right TKA  brought to ED after she missed a step and fell at her cardiologist office.  Denies hitting her head or LOC.  No prodromes.  In ED, hemodynamically stable.  Labs without significant finding.  X-ray revealed comminuted displaced and angulated distal right femoral metaphyseal fracture extending to the knee prosthesis.  Right hand x-ray with questionable tiny erosion of the fourth MCP joint, questionable torn scapholunate ligament and scattered degenerative changes.    Patient underwent ORIF of distal femoral fracture by Dr. Doreatha Martin on 05/10/2020.  Therapy recommending home health with DME.  Postop course complicated by acute urinary retention with bladder over distention and acute blood loss anemia likely from surgery.  Patient had about 1 L UOP on I&O cath likely due to opiates.  Renal function stable.  Renal US without acute finding.  Indwelling Foley catheter placed on 12/12.    ABLA thought to be surgical although only 50 cc documented in op note.  Her Hemoccult was negative x2.  She also received 2 units of blood, and Hgb improved from 7.7-11.0.    Subjective: Seen and examined earlier this morning.  She had epigastric discomfort that has improved after sitting up and belching.  Felt well earlier this morning but had an episode of bilious emesis later in the day.  Emesis was nonbloody.  She felt better after emesis.  Objective: Vitals:   05/12/20 1829 05/12/20 1934 05/13/20 0448 05/13/20 0746  BP: 123/64 130/62 120/63 (!) 120/54  Pulse: 77 80 87 87  Resp: 18 17 16 17   Temp: 98.1 F (36.7 C) 98.6 F (37 C) 98 F (36.7 C) 98.2 F (36.8 C)  TempSrc: Oral   Oral  SpO2: 97%  98% 93% 96%  Weight:      Height:        Intake/Output Summary (Last 24 hours) at 05/13/2020 1319 Last data filed at 05/13/2020 0100 Gross per 24 hour  Intake 1154 ml  Output 150 ml  Net 1004 ml   Filed Weights   05/09/20 1857 05/10/20 1211  Weight: 86.2 kg 86.2 kg    Examination:  GENERAL: No apparent distress.  Nontoxic. HEENT: MMM.  Vision and hearing grossly intact.  NECK: Supple.  No apparent JVD.  RESP: On RA.  No IWOB.  Fair aeration bilaterally. CVS:  RRR. Heart sounds normal.  ABD/GI/GU: BS+. Abd soft, NTND.  MSK/EXT:  Moves extremities.  Neurovascular intact. SKIN: Dressing over lateral aspect of right leg DCI.  No obvious hematoma ecchymosis. NEURO: Awake, alert and oriented appropriately.  No apparent focal neuro deficit. PSYCH: Calm. Normal affect.   Procedures:  05/10/2020-ORIF of distal femoral fracture by Dr. Doreatha Martin  Microbiology summarized: [id number]-16 and influenza PCR nonreactive.  Assessment & Plan: Accidental fall from standing height-missed the step and fell at her cardiologist office.  No prodromes. Traumatic osteoporotic fracture Right comminuted and displaced distal femoral fracture due to accidental fall -Vitamin D within normal. -S/p ORIF by Dr. Doreatha Martin on 05/10/2020. -Scheduled Tylenol to minimize use of opiates GI and urinary issues -WBAT on RLE.   -PT/OT recommending home health with DME.  Controlled NIDDM-2 with hyperglycemia: A1c 6.4%.  On Metformin and glipizide at home. Recent Labs  Lab  05/12/20 1211 05/12/20 1551 05/12/20 1935 05/13/20 0701 05/13/20 1054  GLUCAP 219* 214* 148* 149* 157*  -Increased SSI to moderate on 12/12 -Increased NovoLog from 3 to 4 units AC on 12/12 -Further adjustment as appropriate. -Continue atorvastatin  Essential hypertension: Normotensive -Continue home amlodipine -Resumed home losartan on 12/12. -Continue home metoprolol XL 50 mg daily.  Nonobstructive CAD: Stable.  No anginal  symptoms. -Continue home medications as above.  Acute urinary retention with bladder over distention: over 1 L UOP on I&O cath on 12/11.  UA, urine culture, renal ultrasound negative.  Indwelling Foley catheter placed on 12/12.  -Try voiding trial on 12/13. If she fails, will reinsert Foley for outpatient follow-up -Minimize opiates or sedating medications.  Hyperlipidemia -Continue statin.  Hypokalemia: Resolved.  Postop ABLA with chronic IDA: slight drop in Hgb, dilutional pattern with all cell lines down.  Denies melena or hematochezia.  Hemoccult negative x2.  Anemia panel suggests IDA.  Iron sat 6%.  TIBC 307.  Ferritin 68. Recent Labs    05/09/20 1555 05/09/20 2131 05/10/20 0852 05/11/20 0246 05/12/20 0404 05/12/20 0849 05/13/20 0023  HGB 11.2* 10.9* 9.4* 8.4* 7.7* 8.1* 11.0*  -IV Feraheme on 12/11 and 2 units of blood on 12/12 -P.o. ferrous sulfate -Monitor H&H.  Class I obesity Body mass index is 31.62 kg/m. Nutrition Problem: Increased nutrient needs Etiology: post-op healing-Encourage lifestyle change to lose weight. -Could benefit from GLP-1 inhibitors. Signs/Symptoms: estimated needs Interventions: Glucerna shake,MVI   DVT prophylaxis:  enoxaparin (LOVENOX) injection 40 mg Start: 05/11/20 0900 SCDs Start: 05/09/20 1522  Code Status: Full code Family Communication: Updated patient's husband at bedside. Status is: Inpatient  Remains inpatient appropriate because:Inpatient level of care appropriate due to severity of illness   Dispo: The patient is from: Home              Anticipated d/c is to: Home              Anticipated d/c date is: 1 day              Patient currently is not medically stable to d/c.       Consultants:  Orthopedic surgery   Sch Meds:  Scheduled Meds: . sodium chloride   Intravenous Once  . acetaminophen  1,000 mg Oral Q8H  . amLODipine  5 mg Oral Daily  . atorvastatin  20 mg Oral Daily  . brimonidine  1 drop Both Eyes  BID  . Chlorhexidine Gluconate Cloth  6 each Topical Daily  . cholecalciferol  5,000 Units Oral Daily  . dorzolamide-timolol  1 drop Both Eyes BID  . enoxaparin (LOVENOX) injection  40 mg Subcutaneous Q24H  . feeding supplement (GLUCERNA SHAKE)  237 mL Oral TID BM  . gabapentin  100 mg Oral TID  . insulin aspart  0-15 Units Subcutaneous TID WC  . insulin aspart  0-5 Units Subcutaneous QHS  . insulin aspart  4 Units Subcutaneous TID WC  . insulin glargine  25 Units Subcutaneous QHS  . iron polysaccharides  150 mg Oral BID  . latanoprost  1 drop Both Eyes QHS  . losartan  50 mg Oral Daily  . metoprolol succinate  50 mg Oral Daily  . multivitamin with minerals  1 tablet Oral Daily  . mupirocin ointment   Nasal BID  . pantoprazole  40 mg Oral Daily   Continuous Infusions:  PRN Meds:.alum & mag hydroxide-simeth, HYDROcodone-acetaminophen, HYDROmorphone (DILAUDID) injection, methocarbamol, ondansetron (ZOFRAN) IV, polyethylene glycol  Antimicrobials: Anti-infectives (  From admission, onward)   Start     Dose/Rate Route Frequency Ordered Stop   05/10/20 2200  ceFAZolin (ANCEF) IVPB 2g/100 mL premix        2 g 200 mL/hr over 30 Minutes Intravenous Every 8 hours 05/10/20 1650 05/11/20 1720   05/10/20 1339  vancomycin (VANCOCIN) powder  Status:  Discontinued          As needed 05/10/20 1339 05/10/20 1429   05/10/20 0600  ceFAZolin (ANCEF) IVPB 2g/100 mL premix        2 g 200 mL/hr over 30 Minutes Intravenous On call to O.R. 05/09/20 1521 05/10/20 1325       I have personally reviewed the following labs and images: CBC: Recent Labs  Lab 05/09/20 1555 05/09/20 2131 05/10/20 0852 05/11/20 0246 05/12/20 0404 05/12/20 0849 05/13/20 0023  WBC 8.3 8.3 5.1 7.0 7.3  --  9.7  NEUTROABS 6.0  --   --   --   --   --   --   HGB 11.2* 10.9* 9.4* 8.4* 7.7* 8.1* 11.0*  HCT 31.6* 31.5* 26.8* 22.9* 21.5* 23.6* 31.5*  MCV 84.7 83.3 84.0 82.7 83.0  --  82.2  PLT 319 314 251 230 224  --  258    BMP &GFR Recent Labs  Lab 05/09/20 2131 05/10/20 0852 05/11/20 0246 05/12/20 0404 05/13/20 0023  NA 139 136 136 139 137  K 3.9 3.4* 4.3 3.5 3.9  CL 102 100 103 105 101  CO2 25 25 24 26 24   GLUCOSE 222* 187* 257* 210* 145*  BUN 24* 23 18 14 14   CREATININE 1.09* 0.94 0.91 1.02* 0.81  CALCIUM 9.3 8.7* 8.5* 8.7* 9.2  MG  --  1.9 2.1 2.5* 2.5*  PHOS  --  3.7 2.9 3.3 3.5   Estimated Creatinine Clearance: 61.1 mL/min (by C-G formula based on SCr of 0.81 mg/dL). Liver & Pancreas: Recent Labs  Lab 05/10/20 0852 05/11/20 0246 05/12/20 0404 05/13/20 0023  ALBUMIN 3.1* 3.1* 2.6* 2.8*   No results for input(s): LIPASE, AMYLASE in the last 168 hours. No results for input(s): AMMONIA in the last 168 hours. Diabetic: No results for input(s): HGBA1C in the last 72 hours. Recent Labs  Lab 05/12/20 1211 05/12/20 1551 05/12/20 1935 05/13/20 0701 05/13/20 1054  GLUCAP 219* 214* 148* 149* 157*   Cardiac Enzymes: Recent Labs  Lab 05/10/20 0852  CKTOTAL 56   No results for input(s): PROBNP in the last 8760 hours. Coagulation Profile: No results for input(s): INR, PROTIME in the last 168 hours. Thyroid Function Tests: No results for input(s): TSH, T4TOTAL, FREET4, T3FREE, THYROIDAB in the last 72 hours. Lipid Profile: No results for input(s): CHOL, HDL, LDLCALC, TRIG, CHOLHDL, LDLDIRECT in the last 72 hours. Anemia Panel: Recent Labs    05/11/20 0246  VITAMINB12 361  FOLATE 19.9  FERRITIN 68  TIBC 307  IRON 18*  RETICCTPCT 1.9   Urine analysis:    Component Value Date/Time   COLORURINE YELLOW 05/11/2020 1900   APPEARANCEUR CLEAR 05/11/2020 1900   APPEARANCEUR Clear 02/07/2018 1135   LABSPEC 1.013 05/11/2020 1900   PHURINE 5.0 05/11/2020 1900   GLUCOSEU >=500 (A) 05/11/2020 1900   HGBUR NEGATIVE 05/11/2020 1900   BILIRUBINUR NEGATIVE 05/11/2020 1900   BILIRUBINUR Negative 02/07/2018 1135   KETONESUR NEGATIVE 05/11/2020 1900   PROTEINUR NEGATIVE 05/11/2020 1900    UROBILINOGEN 0.2 09/19/2013 1110   NITRITE NEGATIVE 05/11/2020 1900   LEUKOCYTESUR NEGATIVE 05/11/2020 1900   Sepsis Labs: Invalid  input(s): PROCALCITONIN, LACTICIDVEN  Microbiology: Recent Results (from the past 240 hour(s))  Resp Panel by RT-PCR (Flu A&B, Covid) Nasopharyngeal Swab     Status: None   Collection Time: 05/09/20  3:55 PM   Specimen: Nasopharyngeal Swab; Nasopharyngeal(NP) swabs in vial transport medium  Result Value Ref Range Status   SARS Coronavirus 2 by RT PCR NEGATIVE NEGATIVE Final    Comment: (NOTE) SARS-CoV-2 target nucleic acids are NOT DETECTED.  The SARS-CoV-2 RNA is generally detectable in upper respiratory specimens during the acute phase of infection. The lowest concentration of SARS-CoV-2 viral copies this assay can detect is 138 copies/mL. A negative result does not preclude SARS-Cov-2 infection and should not be used as the sole basis for treatment or other patient management decisions. A negative result may occur with  improper specimen collection/handling, submission of specimen other than nasopharyngeal swab, presence of viral mutation(s) within the areas targeted by this assay, and inadequate number of viral copies(<138 copies/mL). A negative result must be combined with clinical observations, patient history, and epidemiological information. The expected result is Negative.  Fact Sheet for Patients:  EntrepreneurPulse.com.au  Fact Sheet for Healthcare Providers:  IncredibleEmployment.be  This test is no t yet approved or cleared by the [place] FDA and  has been authorized for detection and/or diagnosis of SARS-CoV-2 by FDA under an Emergency Use Authorization (EUA). This EUA will remain  in effect (meaning this test can be used) for the duration of the COVID-19 declaration under Section 564(b)(1) of the Act, 21 U.S.C.section 360bbb-3(b)(1), unless the authorization is terminated  or revoked  sooner.       Influenza A by PCR NEGATIVE NEGATIVE Final   Influenza B by PCR NEGATIVE NEGATIVE Final    Comment: (NOTE) The Xpert Xpress SARS-CoV-2/FLU/RSV plus assay is intended as an aid in the diagnosis of influenza from Nasopharyngeal swab specimens and should not be used as a sole basis for treatment. Nasal washings and aspirates are unacceptable for Xpert Xpress SARS-CoV-2/FLU/RSV testing.  Fact Sheet for Patients: EntrepreneurPulse.com.au  Fact Sheet for Healthcare Providers: IncredibleEmployment.be  This test is not yet approved or cleared by the [place] FDA and has been authorized for detection and/or diagnosis of SARS-CoV-2 by FDA under an Emergency Use Authorization (EUA). This EUA will remain in effect (meaning this test can be used) for the duration of the COVID-19 declaration under Section 564(b)(1) of the Act, 21 U.S.C. section 360bbb-3(b)(1), unless the authorization is terminated or revoked.  Performed at [facility], [place] [address], [place], [place] [address]   Surgical pcr screen     Status: Abnormal   Collection Time: 05/09/20 10:34 PM   Specimen: Nasal Mucosa; Nasal Swab  Result Value Ref Range Status   MRSA, PCR NEGATIVE NEGATIVE Final   Staphylococcus aureus POSITIVE (A) NEGATIVE Final    Comment: (NOTE) The Xpert SA Assay (FDA approved for NASAL specimens in patients 40 years of age and older), is one component of a comprehensive surveillance program. It is not intended to diagnose infection nor to guide or monitor treatment. Performed at [facility], [place] [address] [place], [place] [address]   Culture, Urine     Status: None   Collection Time: 05/11/20 10:13 AM   Specimen: Urine, Catheterized  Result Value Ref Range Status   Specimen Description URINE, CATHETERIZED  Final   Special Requests NONE  Final   Culture   Final    NO GROWTH Performed at [facility],  [address], [place], [place] [address]    Report Status 05/12/2020 FINAL  Final    Radiology Studies: No results found.    Keona Sheffler T. Has[place]  If 7PM-7AM, please contact night-coverage [email or web address] 05/13/2020, 1:19 PM

## 2020-05-13 NOTE — Plan of Care (Signed)
  Problem: Education: Goal: Knowledge of General Education information will improve Description Including pain rating scale, medication(s)/side effects and non-pharmacologic comfort measures Outcome: Progressing   

## 2020-05-13 NOTE — Progress Notes (Signed)
12/13 Pt was bloated and complained of abdominal discomfort. RN encouraged to sit at the edge of bed and she got relieve after belching. Pt is stable. Pt did not complain of pain. Pt observed to be comfortable.

## 2020-05-13 NOTE — Progress Notes (Signed)
Physical Therapy Treatment Patient Details Name: Angela Chambers MRN: [id number] DOB: 28-Dec-1940 Today's Date: 05/13/2020    History of Present Illness 79yo female who fell and sustained a distal femur periprosthetic fracture R LE. Received RLE ORIF 05/10/20. PMH DM, HLD, HTN, obesity, osteoporosis, B TKR    PT Comments    Patient progressing towards physical therapy goals this session. Patient performed supine>sit with supervision and sit to stand from low surface with modA and RW. Patient ambulated 100' with RW and min guard, cues for heel strike on R to encourage R knee extension. Performed functional exercises in recliner at end of session and encouraged patient to perform throughout the day for strengthening. Patient continues to be limited by pain, generalized weakness (R>L), impaired balance, decreased activity tolerance, and impaired functional mobility. Recommend HHPT at this time following discharge to maximize functional independence and safety in the home.     Follow Up Recommendations  Home health PT;Supervision for mobility/OOB     Equipment Recommendations  Rolling Angela Chambers with 5" wheels;3in1 (PT)    Recommendations for Other Services       Precautions / Restrictions Precautions Precautions: Fall Restrictions Weight Bearing Restrictions: Yes RLE Weight Bearing: Weight bearing as tolerated    Mobility  Bed Mobility Overal bed mobility: Needs Assistance Bed Mobility: Supine to Sit     Supine to sit: Supervision     General bed mobility comments: increased time required to perform  Transfers Overall transfer level: Needs assistance Equipment used: Rolling Angela Chambers (2 wheeled) Transfers: Sit to/from Stand Sit to Stand: Mod assist         General transfer comment: modA required from low surface with RW  Ambulation/Gait Ambulation/Gait assistance: Min guard Gait Distance (Feet): 100 Feet Assistive device: Rolling Angela Chambers (2 wheeled) Gait  Pattern/deviations: Step-to pattern;Decreased stride length;Trunk flexed         Stairs             Wheelchair Mobility    Modified Rankin (Stroke Patients Only)       Balance Overall balance assessment: History of Falls;Needs assistance Sitting-balance support: Bilateral upper extremity supported;Feet supported Sitting balance-Leahy Scale: Good     Standing balance support: Bilateral upper extremity supported;During functional activity Standing balance-Leahy Scale: Fair                              Cognition Arousal/Alertness: Awake/alert Behavior During Therapy: WFL for tasks assessed/performed Overall Cognitive Status: Within Functional Limits for tasks assessed                                        Exercises General Exercises - Lower Extremity Ankle Circles/Pumps: AROM;Both;10 reps Quad Sets: AROM;Both;10 reps Heel Slides: AROM;Right;10 reps Straight Leg Raises: AROM;Right;10 reps    General Comments        Pertinent Vitals/Pain Pain Assessment: Faces Faces Pain Scale: Hurts little more Pain Location: R LE Pain Descriptors / Indicators: Grimacing;Guarding Pain Intervention(s): Monitored during session    Home Living                      Prior Function            PT Goals (current goals can now be found in the care plan section) Acute Rehab PT Goals Patient Stated Goal: go home when able PT Goal Formulation: With  patient Time For Goal Achievement: 05/25/20 Potential to Achieve Goals: Good Progress towards PT goals: Progressing toward goals    Frequency    Min 4X/week      PT Plan Frequency needs to be updated    Co-evaluation              AM-PAC PT "6 Clicks" Mobility   Outcome Measure  Help needed turning from your back to your side while in a flat bed without using bedrails?: A Little Help needed moving from lying on your back to sitting on the side of a flat bed without using  bedrails?: A Little Help needed moving to and from a bed to a chair (including a wheelchair)?: A Little Help needed standing up from a chair using your arms (e.g., wheelchair or bedside chair)?: A Little Help needed to walk in hospital room?: A Little Help needed climbing 3-5 steps with a railing? : A Little 6 Click Score: 18    End of Session Equipment Utilized During Treatment: Gait belt Activity Tolerance: Patient tolerated treatment well Patient left: in chair;with call bell/phone within reach;with chair alarm set Nurse Communication: Mobility status PT Visit Diagnosis: Unsteadiness on feet (R26.81);Difficulty in walking, not elsewhere classified (R26.2);Muscle weakness (generalized) (M62.81);History of falling (Z91.81);Pain Pain - Right/Left: Right Pain - part of body: Leg     Time: 1130-1154 PT Time Calculation (min) (ACUTE ONLY): 24 min  Charges:  $Therapeutic Activity: 23-37 mins                     Angela Chambers, PT, DPT Acute Rehabilitation Services Pager [phone] Office [phone]    Angela Chambers 05/13/2020, 1:02 PM

## 2020-05-13 NOTE — Progress Notes (Signed)
Orthopaedic Trauma Progress Note  SUBJECTIVE: Reports mild pain about operative site. Moving around well with therapies. Had some urinary retention over the weekend which required foley to be placed. Renal U/S with no significant findings. No chest pain. No SOB. No nausea/vomiting. No other complaints.   OBJECTIVE:  Vitals:   05/13/20 0448 05/13/20 0746  BP: 120/63 (!) 120/54  Pulse: 87 87  Resp: 16 17  Temp: 98 F (36.7 C) 98.2 F (36.8 C)  SpO2: 93% 96%    General: Resting in bed, NAD Respiratory: No increased work of breathing.  Right Lower Extremity: Dressings removed, incisions CDI. Mild knee effusion. Tolerates gentle knee flexion. ankle dorsiflexion/plantarflexion intact. Sensation intact to light touch distally.+ EHL. +FHL. Compartments soft and compressible. +DP pulse  IMAGING: Stable post op imaging.   LABS:  Results for orders placed or performed during the hospital encounter of 05/09/20 (from the past 24 hour(s))  Glucose, capillary     Status: Abnormal   Collection Time: 05/12/20  8:31 AM  Result Value Ref Range   Glucose-Capillary 121 (H) 70 - 99 mg/dL  Hemoglobin and hematocrit, blood     Status: Abnormal   Collection Time: 05/12/20  8:49 AM  Result Value Ref Range   Hemoglobin 8.1 (L) 12.0 - 15.0 g/dL   HCT 23.6 (L) 36.0 - 46.0 %  Occult blood card to lab, stool RN will collect     Status: None   Collection Time: 05/12/20 10:46 AM  Result Value Ref Range   Fecal Occult Bld NEGATIVE NEGATIVE  Glucose, capillary     Status: Abnormal   Collection Time: 05/12/20 12:11 PM  Result Value Ref Range   Glucose-Capillary 219 (H) 70 - 99 mg/dL  Type and screen     Status: None (Preliminary result)   Collection Time: 05/12/20 12:36 PM  Result Value Ref Range   ABO/RH(D) O POS    Antibody Screen NEG    Sample Expiration 05/15/2020,2359    Unit Number [id number]    Blood Component Type RBC LR PHER1    Unit division 00    Status of Unit ISSUED    Transfusion  Status OK TO TRANSFUSE    Crossmatch Result      Compatible Performed at [facility], [address], [place], [place] [address]    Unit Number [id number]    Blood Component Type RBC LR PHER2    Unit division 00    Status of Unit ISSUED    Transfusion Status OK TO TRANSFUSE    Crossmatch Result Compatible   Prepare RBC (crossmatch)     Status: None   Collection Time: 05/12/20 12:38 PM  Result Value Ref Range   Order Confirmation      ORDER PROCESSED BY BLOOD BANK Performed at [facility], [place] [address] [place], [place] [address]   Glucose, capillary     Status: Abnormal   Collection Time: 05/12/20  3:51 PM  Result Value Ref Range   Glucose-Capillary 214 (H) 70 - 99 mg/dL  Glucose, capillary     Status: Abnormal   Collection Time: 05/12/20  7:35 PM  Result Value Ref Range   Glucose-Capillary 148 (H) 70 - 99 mg/dL  Renal function panel     Status: Abnormal   Collection Time: 05/13/20 12:23 AM  Result Value Ref Range   Sodium 137 135 - 145 mmol/L   Potassium 3.9 3.5 - 5.1 mmol/L   Chloride 101 98 - 111 mmol/L  CO2 24 22 - 32 mmol/L   Glucose, Bld 145 (H) 70 - 99 mg/dL   BUN 14 8 - 23 mg/dL   Creatinine, Ser 0.81 0.44 - 1.00 mg/dL   Calcium 9.2 8.9 - 10.3 mg/dL   Phosphorus 3.5 2.5 - 4.6 mg/dL   Albumin 2.8 (L) 3.5 - 5.0 g/dL   GFR, Estimated >60 >60 mL/min   Anion gap 12 5 - 15  Magnesium     Status: Abnormal   Collection Time: 05/13/20 12:23 AM  Result Value Ref Range   Magnesium 2.5 (H) 1.7 - 2.4 mg/dL  CBC     Status: Abnormal   Collection Time: 05/13/20 12:23 AM  Result Value Ref Range   WBC 9.7 4.0 - 10.5 K/uL   RBC 3.83 (L) 3.87 - 5.11 MIL/uL   Hemoglobin 11.0 (L) 12.0 - 15.0 g/dL   HCT 31.5 (L) 36.0 - 46.0 %   MCV 82.2 80.0 - 100.0 fL   MCH 28.7 26.0 - 34.0 pg   MCHC 34.9 30.0 - 36.0 g/dL   RDW 14.5 11.5 - 15.5 %   Platelets 258 150 - 400 K/uL   nRBC 0.3 (H) 0.0 - 0.2 %  Glucose, capillary     Status: Abnormal   Collection Time:  05/13/20  7:01 AM  Result Value Ref Range   Glucose-Capillary 149 (H) 70 - 99 mg/dL    ASSESSMENT: Angela Chambers is a 79 y.o. female, 3 Days Post-Op s/p ORIF RIGHT DISTAL FEMUR FRACTURE  CV/Blood loss: Acute blood loss anemia, Hgb 11.0 this morning. Received 2 units PRBCs 05/12/20.   PLAN: Weightbearing: WBAT RLE Incisional and dressing care: Ok to leave incisions open to air  Showering: Ok to shower with assiatnce Orthopedic device(s): None  Pain management:  1. Tylenol 1000 mg q 8 hours scheduled 2. Robaxin 500 mg q 6 hours PRN 3. Norco 5-325 mg q 6 hours PRN 4. Neurontin 100 mg TID 5. Dilaudid 0.5 mg q 4 hours PRN VTE prophylaxis: Lovenox, SCDs ID:  Ancef 2gm post op comepletd Foley/Lines: Foley in place for urinary retention, KVO IVFs Impediments to Fracture Healing: Vit D level 35, no additional supplementation needed Dispo: Therapies as tolerated. Declines from CIR, recommendation in now SNF. Patient okay for discharge from ortho standpoint once cleared by medicine team and therapies. Have signed and placed d/c rx for pain medication and lovenox in chart Follow - up plan: 2 weeks  Contact information:  Katha Hamming MD, Patrecia Pace PA-C. After hours and holidays please check [email or web address] for group call information for [facility]   Landin Tallon A. Ricci Barker, PA-C [phone] (office) [email or web address]

## 2020-05-14 LAB — CBC
HCT: 29.2 % — ABNORMAL LOW (ref 36.0–46.0)
Hemoglobin: 10.3 g/dL — ABNORMAL LOW (ref 12.0–15.0)
MCH: 29.6 pg (ref 26.0–34.0)
MCHC: 35.3 g/dL (ref 30.0–36.0)
MCV: 83.9 fL (ref 80.0–100.0)
Platelets: 255 10*3/uL (ref 150–400)
RBC: 3.48 MIL/uL — ABNORMAL LOW (ref 3.87–5.11)
RDW: 14.9 % (ref 11.5–15.5)
WBC: 8.2 10*3/uL (ref 4.0–10.5)
nRBC: 0 % (ref 0.0–0.2)

## 2020-05-14 LAB — HEMOGLOBIN AND HEMATOCRIT, BLOOD
HCT: 27.6 % — ABNORMAL LOW (ref 36.0–46.0)
Hemoglobin: 9.5 g/dL — ABNORMAL LOW (ref 12.0–15.0)

## 2020-05-14 LAB — GLUCOSE, CAPILLARY
Glucose-Capillary: 78 mg/dL (ref 70–99)
Glucose-Capillary: 216 mg/dL — ABNORMAL HIGH (ref 70–99)
Glucose-Capillary: 85 mg/dL (ref 70–99)

## 2020-05-14 MED ORDER — BETHANECHOL CHLORIDE 10 MG PO TABS
10.0000 mg | ORAL_TABLET | Freq: Three times a day (TID) | ORAL | Status: DC
  Administered 2020-05-14: 19:00:00 10 mg via ORAL
  Filled 2020-05-14 (×2): qty 1

## 2020-05-14 MED ORDER — SENNOSIDES-DOCUSATE SODIUM 8.6-50 MG PO TABS: 1.0000 | ORAL_TABLET | Freq: Two times a day (BID) | ORAL | 0 refills | Status: DC | PRN

## 2020-05-14 MED ORDER — BETHANECHOL CHLORIDE 10 MG PO TABS: 10.0000 mg | ORAL_TABLET | Freq: Three times a day (TID) | ORAL | 0 refills | Status: DC

## 2020-05-14 MED ORDER — POLYSACCHARIDE IRON COMPLEX 150 MG PO CAPS: 150.0000 mg | ORAL_CAPSULE | Freq: Every day | ORAL | 1 refills | Status: DC

## 2020-05-14 MED ORDER — ACETAMINOPHEN 500 MG PO TABS: 1000.0000 mg | ORAL_TABLET | Freq: Three times a day (TID) | ORAL | 0 refills | Status: AC

## 2020-05-14 MED ORDER — BETHANECHOL CHLORIDE 25 MG PO TABS
25.0000 mg | ORAL_TABLET | Freq: Once | ORAL | Status: AC
  Administered 2020-05-14: 09:00:00 25 mg via ORAL
  Filled 2020-05-14: qty 1

## 2020-05-14 NOTE — Care Management Important Message (Signed)
Important Message  Patient Details  Name: Angela Chambers MRN: [id number] Date of Birth: 1940-10-01   Medicare Important Message Given:  Yes     [name] 05/14/2020, 3:17 PM

## 2020-05-14 NOTE — Discharge Summary (Signed)
Physician Discharge Summary  Angela Chambers EPP:[id number] DOB: Jan 31, 1941 DOA: 05/09/2020  PCP: Horald Pollen, MD  Admit date: 05/09/2020 Discharge date: 05/14/2020  Admitted From: Home Disposition: Home  Recommendations for Outpatient Follow-up:  1. Follow ups as below. 2. Please obtain CBC/BMP/Mag at follow up 3. Please follow up on the following pending results: None  Home Health: PT/OT Equipment/Devices: Rolling walker  Discharge Condition: Stable CODE STATUS: Full code   Follow-up Information    Haddix, Thomasene Lot, MD. Schedule an appointment as soon as possible for a visit in 2 week(s).   Specialty: Orthopedic Surgery Why: For repeat x-rays and wound check Contact information: [place] [address] [phone]        Horald Pollen, MD. Schedule an appointment as soon as possible for a visit.   Specialty: Internal Medicine Why: Please follow up with you primary care in the next 7-10 days after your discharge home from the hospital. Contact information: [place] [address] [phone]        [facility], [facility] Follow up.   Specialty: Home Health Services Why: Alvis Lemmings will be providing you with home health for physical therapy and occupational therapy.  They will call you in the next 24-48 hours to set you up with therapy times. Contact information: [address] [place] [address] [phone]        [facility] Follow up.   Why: If you do not find your rolling walker at home we will provide you with one before you are discharged home from the hospital. Contact information: [address] [address] [phone]        [facility]. Schedule an appointment as soon as possible for a visit in 1 week(s).   Contact information: [place] [phone]              Hospital  Course: 79 year old female with history of DM-2, HTN, HLD, nonobstructive CAD and right TKA  brought to ED after she missed a step and fell at her cardiologist office.  Denies hitting her head or LOC.  No prodromes.  In ED, hemodynamically stable.  Labs without significant finding.  X-ray revealed comminuted displaced and angulated distal right femoral metaphyseal fracture extending to the knee prosthesis.  Right hand x-ray with questionable tiny erosion of the fourth MCP joint, questionable torn scapholunate ligament and scattered degenerative changes.    Patient underwent ORIF of distal femoral fracture by Dr. Doreatha Martin on 05/10/2020.  Therapy recommending home health with DME.  Postop course complicated by acute urinary retention with bladder over distention and acute blood loss anemia likely from surgery.  Patient had about 1 L UOP on I&O cath likely due to opiates.  Renal function stable.  Renal US without acute finding.  Indwelling Foley catheter placed on 12/12. She failed voiding trial on 05/14/20. Foley replaced. She will follow up with urology for voiding trial.  ABLA thought to be surgical although only 50 cc documented in op note.  Her Hemoccult was negative x2.  She received 2 units of blood with appropriate response.  She also received IV Feraheme.  Hgb stable at 10 g on discharge.  She is discharged on p.o. iron.  See individual problem list below for more on hospital course.  Discharge Diagnoses:  Accidental fall from standing height-missed the step and fell at her cardiologist office.  No  prodromes. Traumatic osteoporotic fracture Right comminuted and displaced distal femoral fracture due to accidental fall -Vitamin D within normal. -S/p ORIF by Dr. Doreatha Martin on 05/10/2020. -Scheduled Tylenol and as needed Norco with bowel regimen -Subcu Lovenox for VTE prophylaxis. -WBAT on RLE.   -Home health PT/OT and rolling walker. -Outpatient follow-up with orthopedic surgery in 2  weeks.  Controlled NIDDM-2 with hyperglycemia: A1c 6.4%.  On Metformin and glipizide at home. Recent Labs  Lab 05/13/20 1648 05/13/20 2205 05/14/20 0815 05/14/20 1229 05/14/20 1633  GLUCAP 176* 188* 78 216* 85  -Discharged on home Metformin and glipizide. -Continue home atorvastatin.  Essential hypertension: Normotensive -Continue home amlodipine, losartan, HCTZ and metoprolol XL.  Nonobstructive CAD: Stable.  No anginal symptoms. -Continue home medications as above.  Acute urinary retention with bladder over distention: over 1 L UOP on I&O cath on 12/11.  UA, urine culture, renal ultrasound negative.  Indwelling Foley catheter placed on 12/12. She failed voiding trial prior to discharge on 05/14/2020.  -Follow up with urology in one to two weeks for voiding trial -Advised to minimize opiate use  Hyperlipidemia -Continue statin.  Hypokalemia: Resolved.  Postop ABLA with chronic IDA: slight drop in Hgb, dilutional pattern with all cell lines down.  Denies melena or hematochezia.  Hemoccult negative x2.  Anemia panel suggests IDA.  Iron sat 6%.  TIBC 307.  Ferritin 68.  Received IV Feraheme on 12/11 and 2 units of blood on 12/12 with appropriate response.  Hgb stable at 10 g on discharge. Recent Labs    05/09/20 1555 05/09/20 2131 05/10/20 0852 05/11/20 0246 05/12/20 0404 05/12/20 0849 05/13/20 0023 05/14/20 0413 05/14/20 1204  HGB 11.2* 10.9* 9.4* 8.4* 7.7* 8.1* 11.0* 9.5* 10.3*  -Discharged on p.o. iron with bowel regimen.  Class I obesity -Encourage lifestyle change to lose weight. -Could benefit from GLP-1 inhibitors. Body mass index is 31.62 kg/m. Nutrition Problem: Increased nutrient needs Etiology: post-op healing Signs/Symptoms: estimated needs Interventions: Glucerna shake,MVI      Discharge Exam: Vitals:   05/14/20 0900 05/14/20 1300  BP: 139/68 134/68  Pulse: 80 79  Resp: 17 17  Temp: 97.7 F (36.5 C) 97.6 F (36.4 C)  SpO2: 100% 100%     GENERAL: No apparent distress.  Nontoxic. HEENT: MMM.  Vision and hearing grossly intact.  NECK: Supple.  No apparent JVD.  RESP: On RA.  No IWOB.  Fair aeration bilaterally. CVS:  RRR. Heart sounds normal.  ABD/GI/GU: Bowel sounds present. Soft. Non tender.  MSK/EXT:  Moves extremities. No apparent deformity. No edema.  SKIN: Surgical wound over right leg appears clean.  No hematoma ecchymosis. NEURO: Awake, alert and oriented appropriately.  No apparent focal neuro deficit. PSYCH: Calm. Normal affect.  Discharge Instructions  Discharge Instructions    Ambulatory referral to Urology   Complete by: As directed    Call MD for:  difficulty breathing, headache or visual disturbances   Complete by: As directed    Call MD for:  extreme fatigue   Complete by: As directed    Call MD for:  persistant dizziness or light-headedness   Complete by: As directed    Call MD for:  persistant nausea and vomiting   Complete by: As directed    Call MD for:  severe uncontrolled pain   Complete by: As directed    Call MD for:  temperature >100.4   Complete by: As directed    Diet - low sodium heart healthy   Complete by: As directed  Diet Carb Modified   Complete by: As directed    Discharge instructions   Complete by: As directed    It has been a pleasure taking care of you!  You were hospitalized due to leg fracture after fall.  You were treated surgically.  You also had iron deficiency anemia and urinary retention.  You received blood transfusion and IV iron for anemia.  We are discharging you on iron tablets to continue taking.  Urine retention is likely due to pain medications.  Please follow up with urology in one week. We recommend using Tylenol every 8 hours with Vicodin/Norco for breakthrough pain.   Take care,   Increase activity slowly   Complete by: As directed    No wound care   Complete by: As directed      Allergies as of 05/14/2020      Reactions   Lisinopril Cough    Metformin And Related    Diarrhea on 1022m BID, tolerates 500 mg BID      Medication List    STOP taking these medications   CORICIDIN HBP COLD/FLU PO   ibuprofen 200 MG tablet Commonly known as: ADVIL     TAKE these medications   acetaminophen 500 MG tablet Commonly known as: TYLENOL Take 2 tablets (1,000 mg total) by mouth every 8 (eight) hours.   amLODipine 5 MG tablet Commonly known as: NORVASC TAKE 1 TABLET(5 MG) BY MOUTH DAILY What changed: See the new instructions.   aspirin 81 MG tablet Take 81 mg by mouth daily.   atorvastatin 20 MG tablet Commonly known as: LIPITOR Take 1 tablet (20 mg total) by mouth daily.   bethanechol 10 MG tablet Commonly known as: URECHOLINE Take 1 tablet (10 mg total) by mouth 3 (three) times daily.   Blood Glucose Monitor System w/Device Kit 1 Units by Does not apply route 2 (two) times daily.   brimonidine 0.2 % ophthalmic solution Commonly known as: ALPHAGAN Place 1 drop into both eyes 2 (two) times daily.   Co-Enzyme Q-10 30 MG Caps Take 30 mg by mouth daily.   dorzolamide-timolol 22.3-6.8 MG/ML ophthalmic solution Commonly known as: COSOPT Place 1 drop into both eyes 2 (two) times daily.   enoxaparin 40 MG/0.4ML injection Commonly known as: LOVENOX Inject 0.4 mLs (40 mg total) into the skin daily for 28 days.   glipiZIDE 5 MG tablet Commonly known as: GLUCOTROL TAKE 1 TABLET(5 MG) BY MOUTH TWICE DAILY BEFORE A MEAL What changed: See the new instructions.   hydrochlorothiazide 25 MG tablet Commonly known as: HYDRODIURIL Take 1 tablet (25 mg total) by mouth daily.   HYDROcodone-acetaminophen 5-325 MG tablet Commonly known as: NORCO/VICODIN Take 1 tablet by mouth every 6 (six) hours as needed for severe pain.   iron polysaccharides 150 MG capsule Commonly known as: NIFEREX Take 1 capsule (150 mg total) by mouth daily.   latanoprost 0.005 % ophthalmic solution Commonly known as: XALATAN Place 1 drop into  both eyes at bedtime.   losartan 50 MG tablet Commonly known as: COZAAR Take 50 mg by mouth daily. What changed: Another medication with the same name was removed. Continue taking this medication, and follow the directions you see here.   metFORMIN 500 MG tablet Commonly known as: GLUCOPHAGE Take 1 tablet (500 mg total) by mouth 2 (two) times daily with a meal.   metoprolol succinate 50 MG 24 hr tablet Commonly known as: TOPROL-XL TAKE 1 TABLET BY MOUTH EVERY DAY TAKE IMMEDIATELY FOLLOWING A MEAL  What changed: See the new instructions.   multivitamin tablet Take 1 tablet by mouth daily.   senna-docusate 8.6-50 MG tablet Commonly known as: Senokot-S Take 1 tablet by mouth 2 (two) times daily between meals as needed for mild constipation.            Durable Medical Equipment  (From admission, onward)         Start     Ordered   05/13/20 1619  For home use only DME Walker rolling  Once       Question Answer Comment  Walker: With 5 Inch Wheels   Patient needs a walker to treat with the following condition Femur fracture, right ([place])      05/13/20 1618          Consultations:  Orthopedic surgery  Procedures/Studies:  05/10/2020-ORIF of distal femoral fracture by Dr. Belinda Fisher Chest 2 View  Result Date: 05/09/2020 CLINICAL DATA:  fall EXAM: CHEST - 2 VIEW COMPARISON:  09/19/2013 and prior. FINDINGS: No focal consolidation. No pneumothorax or pleural effusion. Cardiomediastinal silhouette is within normal limits. No acute osseous abnormality. IMPRESSION: No focal airspace disease. Electronically Signed   By: Primitivo Gauze M.D.   On: 05/09/2020 15:00   US RENAL  Result Date: 05/12/2020 CLINICAL DATA:  Urinary retention. EXAM: RENAL / URINARY TRACT ULTRASOUND COMPLETE COMPARISON:  None. FINDINGS: Right Kidney: Renal measurements: 9.9 x 4.4 x 4.4 = volume: 99 mL. Echogenicity within normal limits. No hydronephrosis. There is a cyst in the right kidney  measuring 2.6 cm. Left Kidney: Renal measurements: 9.5 x 5.0 x 4.6 cm = volume: 114 mL. Echogenicity within normal limits. No mass or hydronephrosis visualized. Bladder: Decompressed. Other: None. IMPRESSION: No acute findings sonographically in the bilateral kidneys. Right renal cyst. Electronically Signed   By: Audie Pinto M.D.   On: 05/12/2020 14:51   DG Knee Complete 4 Views Right  Result Date: 05/09/2020 CLINICAL DATA:  Tripped and fell at [place] office today, RIGHT knee and hip pain EXAM: RIGHT KNEE - COMPLETE 4+ VIEW COMPARISON:  05/24/2012 FINDINGS: Interval RIGHT total knee arthroplasty. Diffuse osseous demineralization. Comminuted oblique fracture of distal RIGHT femoral metaphysis, displaced and apex anteriorly angulated, extending into femoral component of knee prosthesis. No dislocation. Visualized tibia and fibula appear intact. IMPRESSION: Prior RIGHT total knee arthroplasty. Osseous demineralization with comminuted displaced and angulated distal RIGHT femoral metaphyseal fracture extending to knee prosthesis. Electronically Signed   By: Lavonia Dana M.D.   On: 05/09/2020 13:16   DG Knee Right Port  Result Date: 05/10/2020 CLINICAL DATA:  Postoperative evaluation. EXAM: PORTABLE RIGHT KNEE - 1-2 VIEW COMPARISON:  May 09, 2020 FINDINGS: A right knee replacement is seen without evidence of surrounding lucency to suggest the presence of hardware loosening or infection. A large radiopaque fixation plate and screws are seen along the lateral aspect of the mid and distal right femur. This represents a new finding when compared to the prior exam. Acute fracture of the distal right femoral shaft is seen with gross anatomic alignment. There is no evidence of dislocation. A small joint effusion is seen. IMPRESSION: 1. Intact right knee replacement with interval internal fixation of the mid and distal right femur. Electronically Signed   By: Virgina Norfolk M.D.   On: 05/10/2020 16:43    DG Hand Complete Right  Result Date: 05/09/2020 CLINICAL DATA:  Tripped and fell at [place] office today, RIGHT knee and hip pain, abrasion RIGHT hand EXAM: RIGHT HAND - COMPLETE  3+ VIEW COMPARISON:  None FINDINGS: Osseous demineralization. Degenerative changes at first [facility] joint, minimally at first MCP joint. Slight widening of scapholunate interval question torn scapholunate ligament. 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 Scattered narrowing of IP joints. No acute fracture, dislocation, or bone destruction. Question small juxta-articular erosion at fourth MCP joint. IMPRESSION: Scattered degenerative changes as above. Questionable tiny erosion at the fourth MCP joint Question torn scapholunate ligament. Electronically Signed   By: Lavonia Dana M.D.   On: 05/09/2020 13:15   DG C-Arm 1-60 Min  Result Date: 05/10/2020 CLINICAL DATA:  Trauma. EXAM: RIGHT FEMUR 2 VIEWS; DG C-ARM 1-60 MIN COMPARISON:  December 9, 21 radiographs. FINDINGS: Fluoro time: 58 seconds. Radiation: 5.56 mGy. Six C-arm fluoroscopic images were obtained intraoperatively and submitted for post operative interpretation. These images demonstrate postsurgical changes of plate and screw fixation of the distal femoral fracture. Prior right total knee arthroplasty is imaged. Please see the performing provider's procedural report for further detail. IMPRESSION: Intraoperative fluoroscopic images, as detailed above. Electronically Signed   By: Margaretha Sheffield MD   On: 05/10/2020 16:56   DG Hip Unilat W or Wo Pelvis 2-3 Views Right  Result Date: 05/09/2020 CLINICAL DATA:  Tripped and fell at [place] office today, RIGHT hip and knee pain EXAM: DG HIP (WITH OR WITHOUT PELVIS) 2-3V RIGHT COMPARISON:  None FINDINGS: Osseous demineralization. Narrowing of the hip joints bilaterally. SI joints unremarkable. No acute fracture, dislocation, or bone destruction. IMPRESSION: No acute osseous abnormalities. Osseous demineralization with mild  degenerative changes of the hip joints. Please refer to RIGHT knee radiographic exam for description of distal RIGHT femoral fracture. Electronically Signed   By: Lavonia Dana M.D.   On: 05/09/2020 13:17   DG FEMUR, MIN 2 VIEWS RIGHT  Result Date: 05/10/2020 CLINICAL DATA:  Trauma. EXAM: RIGHT FEMUR 2 VIEWS; DG C-ARM 1-60 MIN COMPARISON:  December 9, 21 radiographs. FINDINGS: Fluoro time: 58 seconds. Radiation: 5.56 mGy. Six C-arm fluoroscopic images were obtained intraoperatively and submitted for post operative interpretation. These images demonstrate postsurgical changes of plate and screw fixation of the distal femoral fracture. Prior right total knee arthroplasty is imaged. Please see the performing provider's procedural report for further detail. IMPRESSION: Intraoperative fluoroscopic images, as detailed above. Electronically Signed   By: Margaretha Sheffield MD   On: 05/10/2020 16:56       The results of significant diagnostics from this hospitalization (including imaging, microbiology, ancillary and laboratory) are listed below for reference.     Microbiology: Recent Results (from the past 240 hour(s))  Resp Panel by RT-PCR (Flu A&B, Covid) Nasopharyngeal Swab     Status: None   Collection Time: 05/09/20  3:55 PM   Specimen: Nasopharyngeal Swab; Nasopharyngeal(NP) swabs in vial transport medium  Result Value Ref Range Status   SARS Coronavirus 2 by RT PCR NEGATIVE NEGATIVE Final    Comment: (NOTE) SARS-CoV-2 target nucleic acids are NOT DETECTED.  The SARS-CoV-2 RNA is generally detectable in upper respiratory specimens during the acute phase of infection. The lowest concentration of SARS-CoV-2 viral copies this assay can detect is 138 copies/mL. A negative result does not preclude SARS-Cov-2 infection and should not be used as the sole basis for treatment or other patient management decisions. A negative result may occur with  improper specimen collection/handling, submission of  specimen other than nasopharyngeal swab, presence of viral mutation(s) within the areas targeted by this assay, and inadequate number of viral copies(<138 copies/mL). A negative result must be combined with clinical observations, patient  history, and epidemiological information. The expected result is Negative.  Fact Sheet for Patients:  EntrepreneurPulse.com.au  Fact Sheet for Healthcare Providers:  IncredibleEmployment.be  This test is no t yet approved or cleared by the [place] FDA and  has been authorized for detection and/or diagnosis of SARS-CoV-2 by FDA under an Emergency Use Authorization (EUA). This EUA will remain  in effect (meaning this test can be used) for the duration of the COVID-19 declaration under Section 564(b)(1) of the Act, 21 U.S.C.section 360bbb-3(b)(1), unless the authorization is terminated  or revoked sooner.       Influenza A by PCR NEGATIVE NEGATIVE Final   Influenza B by PCR NEGATIVE NEGATIVE Final    Comment: (NOTE) The Xpert Xpress SARS-CoV-2/FLU/RSV plus assay is intended as an aid in the diagnosis of influenza from Nasopharyngeal swab specimens and should not be used as a sole basis for treatment. Nasal washings and aspirates are unacceptable for Xpert Xpress SARS-CoV-2/FLU/RSV testing.  Fact Sheet for Patients: EntrepreneurPulse.com.au  Fact Sheet for Healthcare Providers: IncredibleEmployment.be  This test is not yet approved or cleared by the [place] FDA and has been authorized for detection and/or diagnosis of SARS-CoV-2 by FDA under an Emergency Use Authorization (EUA). This EUA will remain in effect (meaning this test can be used) for the duration of the COVID-19 declaration under Section 564(b)(1) of the Act, 21 U.S.C. section 360bbb-3(b)(1), unless the authorization is terminated or revoked.  Performed at [facility], [place]  [address], [place], [place] [address]   Surgical pcr screen     Status: Abnormal   Collection Time: 05/09/20 10:34 PM   Specimen: Nasal Mucosa; Nasal Swab  Result Value Ref Range Status   MRSA, PCR NEGATIVE NEGATIVE Final   Staphylococcus aureus POSITIVE (A) NEGATIVE Final    Comment: (NOTE) The Xpert SA Assay (FDA approved for NASAL specimens in patients 83 years of age and older), is one component of a comprehensive surveillance program. It is not intended to diagnose infection nor to guide or monitor treatment. Performed at [facility], [place] [address] [place], [place] [address]   Culture, Urine     Status: None   Collection Time: 05/11/20 10:13 AM   Specimen: Urine, Catheterized  Result Value Ref Range Status   Specimen Description URINE, CATHETERIZED  Final   Special Requests NONE  Final   Culture   Final    NO GROWTH Performed at [facility] [address], [place], [place] [address]    Report Status 05/12/2020 FINAL  Final     Labs: BNP (last 3 results) No results for input(s): BNP in the last 8760 hours. Basic Metabolic Panel: Recent Labs  Lab 05/09/20 2131 05/10/20 0852 05/11/20 0246 05/12/20 0404 05/13/20 0023  NA 139 136 136 139 137  K 3.9 3.4* 4.3 3.5 3.9  CL 102 100 103 105 101  CO2 25 25 24 26 24   GLUCOSE 222* 187* 257* 210* 145*  BUN 24* 23 18 14 14   CREATININE 1.09* 0.94 0.91 1.02* 0.81  CALCIUM 9.3 8.7* 8.5* 8.7* 9.2  MG  --  1.9 2.1 2.5* 2.5*  PHOS  --  3.7 2.9 3.3 3.5   Liver Function Tests: Recent Labs  Lab 05/10/20 0852 05/11/20 0246 05/12/20 0404 05/13/20 0023  ALBUMIN 3.1* 3.1* 2.6* 2.8*   No results for input(s): LIPASE, AMYLASE in the last 168 hours. No results for input(s): AMMONIA in the last 168 hours. CBC: Recent Labs  Lab  05/09/20 1555 05/09/20 2131 05/10/20 0852 05/11/20 0246 05/12/20 0404 05/12/20 0849 05/13/20 0023 05/14/20 0413 05/14/20 1204  WBC 8.3   < > 5.1 7.0 7.3  --  9.7  --  8.2   NEUTROABS 6.0  --   --   --   --   --   --   --   --   HGB 11.2*   < > 9.4* 8.4* 7.7* 8.1* 11.0* 9.5* 10.3*  HCT 31.6*   < > 26.8* 22.9* 21.5* 23.6* 31.5* 27.6* 29.2*  MCV 84.7   < > 84.0 82.7 83.0  --  82.2  --  83.9  PLT 319   < > 251 230 224  --  258  --  255   < > = values in this interval not displayed.   Cardiac Enzymes: Recent Labs  Lab 05/10/20 0852  CKTOTAL 56   BNP: Invalid input(s): POCBNP CBG: Recent Labs  Lab 05/13/20 1648 05/13/20 2205 05/14/20 0815 05/14/20 1229 05/14/20 1633  GLUCAP 176* 188* 78 216* 85   D-Dimer No results for input(s): DDIMER in the last 72 hours. Hgb A1c No results for input(s): HGBA1C in the last 72 hours. Lipid Profile No results for input(s): CHOL, HDL, LDLCALC, TRIG, CHOLHDL, LDLDIRECT in the last 72 hours. Thyroid function studies No results for input(s): TSH, T4TOTAL, T3FREE, THYROIDAB in the last 72 hours.  Invalid input(s): FREET3 Anemia work up No results for input(s): VITAMINB12, FOLATE, FERRITIN, TIBC, IRON, RETICCTPCT in the last 72 hours. Urinalysis    Component Value Date/Time   COLORURINE YELLOW 05/11/2020 1900   APPEARANCEUR CLEAR 05/11/2020 1900   APPEARANCEUR Clear 02/07/2018 1135   LABSPEC 1.013 05/11/2020 1900   PHURINE 5.0 05/11/2020 1900   GLUCOSEU >=500 (A) 05/11/2020 1900   HGBUR NEGATIVE 05/11/2020 1900   BILIRUBINUR NEGATIVE 05/11/2020 1900   BILIRUBINUR Negative 02/07/2018 1135   KETONESUR NEGATIVE 05/11/2020 1900   PROTEINUR NEGATIVE 05/11/2020 1900   UROBILINOGEN 0.2 09/19/2013 1110   NITRITE NEGATIVE 05/11/2020 1900   LEUKOCYTESUR NEGATIVE 05/11/2020 1900   Sepsis Labs Invalid input(s): PROCALCITONIN,  WBC,  LACTICIDVEN   Time coordinating discharge: 40 minutes  SIGNED:  Mercy Riding, MD  [facility] 05/14/2020, 11:20 PM  If 7PM-7AM, please contact night-coverage [email or web address]

## 2020-05-14 NOTE — Progress Notes (Signed)
Pt given discharge instructions and gone over with her, she verbalized understanding. All questions answered. Pt given prescriptions. Pt instructed on foley care and to call Urology office tomorrow to set up appointment. All belongings gathered to be sent home. Pt's husband driving her home.

## 2020-05-14 NOTE — Plan of Care (Signed)
  Problem: Health Behavior/Discharge Planning: Goal: Ability to manage health-related needs will improve Outcome: Adequate for Discharge   Problem: Activity: Goal: Risk for activity intolerance will decrease Outcome: Adequate for Discharge   

## 2020-05-14 NOTE — Progress Notes (Signed)
Physical Therapy Treatment Patient Details Name: Angela Chambers MRN: [id number] DOB: [birth year] Today's Date: 05/14/2020    History of Present Illness 79yo female who fell and sustained a distal femur periprosthetic fracture R LE. Received RLE ORIF 05/10/20. PMH DM, HLD, HTN, obesity, osteoporosis, B TKR    PT Comments    Patient progressing with ambulation this session with supervision required during 150' with RW. Attempted stairs this session, however patient unable to complete with L handrail and R HHA due to pain and fear of R knee buckling when bearing full weight through R LE. Educated patient about performing exercises throughout the day to strengthen B LE to progress to being able to complete stairs to her bedroom. Patient continues to be limited by pain, generalized weakness, limited R knee ROM, and impaired functional mobility. Continue to recommend HHPT following discharge to maximize functional independence and safety in the home.    Follow Up Recommendations  Home health PT;Supervision for mobility/OOB     Equipment Recommendations  None recommended by PT    Recommendations for Other Services       Precautions / Restrictions Precautions Precautions: Fall Restrictions Weight Bearing Restrictions: Yes RLE Weight Bearing: Weight bearing as tolerated    Mobility  Bed Mobility Overal bed mobility: Needs Assistance Bed Mobility: Supine to Sit;Sit to Supine     Supine to sit: Supervision Sit to supine: Min assist   General bed mobility comments: minA required for sit>supine for R LE advancement onto bed  Transfers Overall transfer level: Needs assistance Equipment used: Rolling Jermia Rigsby (2 wheeled) Transfers: Sit to/from Stand Sit to Stand: Min guard         General transfer comment: min guard for safety, increased time to complete from low surface  Ambulation/Gait Ambulation/Gait assistance: Supervision Gait Distance (Feet): 150 Feet Assistive  device: Rolling Jamell Opfer (2 wheeled) Gait Pattern/deviations: Step-to pattern;Decreased stride length;Trunk flexed     General Gait Details: decreased stance time on R due to pain and hesitancy to bear full weight through R LE   Stairs Stairs: Yes       General stair comments: attempted stair negotiation this session, however patient unable to complete due to pain and fear of R knee buckling with full WB.   Wheelchair Mobility    Modified Rankin (Stroke Patients Only)       Balance Overall balance assessment: Needs assistance Sitting-balance support: No upper extremity supported;Feet supported Sitting balance-Leahy Scale: Good     Standing balance support: Bilateral upper extremity supported;During functional activity Standing balance-Leahy Scale: Fair                              Cognition Arousal/Alertness: Awake/alert Behavior During Therapy: WFL for tasks assessed/performed Overall Cognitive Status: Within Functional Limits for tasks assessed                                        Exercises General Exercises - Lower Extremity Long Arc Quad: AAROM;Right;5 reps    General Comments        Pertinent Vitals/Pain Pain Assessment: Faces Faces Pain Scale: Hurts little more Pain Location: R LE Pain Descriptors / Indicators: Grimacing;Guarding Pain Intervention(s): Monitored during session    Home Living                      Prior Function  PT Goals (current goals can now be found in the care plan section) Acute Rehab PT Goals Patient Stated Goal: to go home PT Goal Formulation: With patient Time For Goal Achievement: 05/25/20 Potential to Achieve Goals: Good Progress towards PT goals: Progressing toward goals    Frequency    Min 4X/week      PT Plan Current plan remains appropriate    Co-evaluation              AM-PAC PT "6 Clicks" Mobility   Outcome Measure  Help needed turning from your  back to your side while in a flat bed without using bedrails?: A Little Help needed moving from lying on your back to sitting on the side of a flat bed without using bedrails?: A Little Help needed moving to and from a bed to a chair (including a wheelchair)?: A Little Help needed standing up from a chair using your arms (e.g., wheelchair or bedside chair)?: A Little Help needed to walk in hospital room?: A Little Help needed climbing 3-5 steps with a railing? : A Lot 6 Click Score: 17    End of Session Equipment Utilized During Treatment: Gait belt Activity Tolerance: Patient tolerated treatment well Patient left: in bed;with call bell/phone within reach;with bed alarm set Nurse Communication: Mobility status PT Visit Diagnosis: Unsteadiness on feet (R26.81);Difficulty in walking, not elsewhere classified (R26.2);Muscle weakness (generalized) (M62.81);History of falling (Z91.81);Pain Pain - Right/Left: Right Pain - part of body: Leg     Time: 6659-9357 PT Time Calculation (min) (ACUTE ONLY): 35 min  Charges:  $Therapeutic Activity: 23-37 mins                     Perrin Maltese, PT, DPT Acute Rehabilitation Services Pager [phone] Office [phone]    [name] 05/14/2020, 9:56 AM

## 2020-05-14 NOTE — Plan of Care (Signed)

## 2020-05-15 ENCOUNTER — Telehealth (INDEPENDENT_AMBULATORY_CARE_PROVIDER_SITE_OTHER): Payer: Medicare Other | Admitting: Emergency Medicine

## 2020-05-15 ENCOUNTER — Encounter: Payer: Self-pay | Admitting: Emergency Medicine

## 2020-05-15 ENCOUNTER — Other Ambulatory Visit: Payer: Self-pay

## 2020-05-15 ENCOUNTER — Ambulatory Visit: Payer: Self-pay

## 2020-05-15 VITALS — Ht 64.0 in | Wt 186.0 lb

## 2020-05-15 DIAGNOSIS — R112 Nausea with vomiting, unspecified: Secondary | ICD-10-CM

## 2020-05-15 DIAGNOSIS — I152 Hypertension secondary to endocrine disorders: Secondary | ICD-10-CM

## 2020-05-15 DIAGNOSIS — T887XXA Unspecified adverse effect of drug or medicament, initial encounter: Secondary | ICD-10-CM | POA: Diagnosis not present

## 2020-05-15 DIAGNOSIS — S7291XA Unspecified fracture of right femur, initial encounter for closed fracture: Secondary | ICD-10-CM

## 2020-05-15 DIAGNOSIS — E1159 Type 2 diabetes mellitus with other circulatory complications: Secondary | ICD-10-CM

## 2020-05-15 DIAGNOSIS — E1169 Type 2 diabetes mellitus with other specified complication: Secondary | ICD-10-CM

## 2020-05-15 DIAGNOSIS — E785 Hyperlipidemia, unspecified: Secondary | ICD-10-CM

## 2020-05-15 MED ORDER — ONDANSETRON 4 MG PO TBDP: 4.0000 mg | ORAL_TABLET | Freq: Three times a day (TID) | ORAL | 2 refills | Status: DC | PRN

## 2020-05-15 NOTE — Telephone Encounter (Signed)
Pt. And husband report pt. Came home yesterday from the hospital after having surgery to femur fracture. States she had vomiting while in the hospital and vomited x 2 since being home. "Not keeping anything down." Also has urinary catheter in place. Warm transfer to Judson Roch in the practice.  Answer Assessment - Initial Assessment Questions 1. VOMITING SEVERITY: "How many times have you vomited in the past 24 hours?"     - MILD:  1 - 2 times/day    - MODERATE: 3 - 5 times/day, decreased oral intake without significant weight loss or symptoms of dehydration    - SEVERE: 6 or more times/day, vomits everything or nearly everything, with significant weight loss, symptoms of dehydration      2 2. ONSET: "When did the vomiting begin?"      While in the hospital 3. FLUIDS: "What fluids or food have you vomited up today?" "Have you been able to keep any fluids down?"     Not keeping anything 4. ABDOMINAL PAIN: "Are your having any abdominal pain?" If yes : "How bad is it and what does it feel like?" (e.g., crampy, dull, intermittent, constant)      Pain - 8/10 5. DIARRHEA: "Is there any diarrhea?" If Yes, ask: "How many times today?"      No 6. CONTACTS: "Is there anyone else in the family with the same symptoms?"      No 7. CAUSE: "What do you think is causing your vomiting?"     Unsure 8. HYDRATION STATUS: "Any signs of dehydration?" (e.g., dry mouth [not only dry lips], too weak to stand) "When did you last urinate?"     Has catheter 9. OTHER SYMPTOMS: "Do you have any other symptoms?" (e.g., fever, headache, vertigo, vomiting blood or coffee grounds, recent head injury)     No 10. PREGNANCY: "Is there any chance you are pregnant?" "When was your last menstrual period?"       No  Protocols used: [facility]

## 2020-05-15 NOTE — Patient Instructions (Signed)
° ° ° °  If you have lab work done today you will be contacted with your lab results within the next 2 weeks.  If you have not heard from us then please contact us. The fastest way to get your results is to register for My Chart. ° ° °IF you received an x-ray today, you will receive an invoice from [facility]. Please contact [facility] at [phone] with questions or concerns regarding your invoice.  ° °IF you received labwork today, you will receive an invoice from [facility]. Please contact [facility] at [phone] with questions or concerns regarding your invoice.  ° °Our billing staff will not be able to assist you with questions regarding bills from these companies. ° °You will be contacted with the lab results as soon as they are available. The fastest way to get your results is to activate your My Chart account. Instructions are located on the last page of this paperwork. If you have not heard from us regarding the results in 2 weeks, please contact this office. °  ° ° ° °

## 2020-05-15 NOTE — Progress Notes (Signed)
Telemedicine Encounter- SOAP NOTE Established Patient Patient: Home  Provider: Office     This telephone encounter was conducted with the patient's (or proxy's) verbal consent via audio telecommunications: yes/no: Yes Patient was instructed to have this encounter in a suitably private space; and to only have persons present to whom they give permission to participate. In addition, patient identity was confirmed by use of name plus two identifiers (DOB and address).  I discussed the limitations, risks, security and privacy concerns of performing an evaluation and management service by telephone and the availability of in person appointments. I also discussed with the patient that there may be a patient responsible charge related to this service. The patient expressed understanding and agreed to proceed.  I spent a total of TIME; 0 MIN TO 60 MIN: 20 minutes talking with the patient or their proxy.  Chief Complaint  Patient presents with  . Emesis    Tries to eat or drink and she still vomited. She vomited 3-4 times since midnight last night.   . Nausea     This came on after the surgery and she also week after. And tired after walking to the restroom.     Subjective   Angela Chambers is a 79 y.o. female established patient. Telephone visit today complaining of nausea and vomiting. Discharged from hospital yesterday after femur fracture surgery.  Doing well.  Ambulating with walker.  Foley catheter in place. However has been taking hydrocodone for pain management which is making her nauseous.  Vomited several times. No other complaints or medical concerns today.  HPI   Patient Active Problem List   Diagnosis Date Noted  . Symptomatic anemia post operative blood loss 05/12/2020  . Femur fracture ([place]) 05/09/2020  . Hypertension associated with diabetes ([place]) 08/16/2018  . Abnormal stress echo EKG portion with normal echo -> imaging confirmed the correct with normal coronaries  06/21/2015  . OA (osteoarthritis) of knee 09/25/2013  . Obesity (BMI 30.0-34.9)   . Hypertensive heart disease without CHF 11/30/2011  . Hyperlipidemia 08/25/2011  . Other and combined forms of senile cataract 07/04/2011  . Primary open angle glaucoma of both eyes, severe stage 07/04/2011  . Dyslipidemia associated with type 2 diabetes mellitus ([place]) 02/22/2009  . PERSONAL HISTORY OF COLONIC POLYPS 02/22/2009    Past Medical History:  Diagnosis Date  . Arthritis   . Cataract   . Diabetes mellitus   . Diabetes mellitus without complication ([place])    Phreesia 05/15/2020  . Fibroids   . Glaucoma   . Glaucoma    Phreesia 05/15/2020  . Hyperlipidemia   . Hypertension 07 /2013    WITH EF GREATER THAN 55 % WITH MILD MR BUT NO PROLASPE ,ESSENTIALLY NORMAL DONE TO EVALUATE PALPITATIONS .  Marland Kitchen Obesity (BMI 30.0-34.9)   . Osteoporosis   . Palpitations    CONTROLLED ON BETA BLOCKER  . Symptomatic anemia post operative blood loss 05/12/2020    Current Outpatient Medications  Medication Sig Dispense Refill  . acetaminophen (TYLENOL) 500 MG tablet Take 2 tablets (1,000 mg total) by mouth every 8 (eight) hours. 40 tablet 0  . amLODipine (NORVASC) 5 MG tablet TAKE 1 TABLET(5 MG) BY MOUTH DAILY (Patient taking differently: Take 5 mg by mouth daily.) 90 tablet 1  . aspirin 81 MG tablet Take 81 mg by mouth daily.    Marland Kitchen atorvastatin (LIPITOR) 20 MG tablet Take 1 tablet (20 mg total) by mouth daily. 90 tablet 3  . bethanechol (  URECHOLINE) 10 MG tablet Take 1 tablet (10 mg total) by mouth 3 (three) times daily. 15 tablet 0  . Blood Glucose Monitoring Suppl (BLOOD GLUCOSE MONITOR SYSTEM) w/Device KIT 1 Units by Does not apply route 2 (two) times daily. 1 each 0  . brimonidine (ALPHAGAN) 0.2 % ophthalmic solution Place 1 drop into both eyes 2 (two) times daily.    . Co-Enzyme Q-10 30 MG CAPS Take 30 mg by mouth daily.    . dorzolamide-timolol (COSOPT) 22.3-6.8 MG/ML ophthalmic solution Place 1 drop into  both eyes 2 (two) times daily.  10  . enoxaparin (LOVENOX) 40 MG/0.4ML injection Inject 0.4 mLs (40 mg total) into the skin daily for 28 days. 11.2 mL 0  . glipiZIDE (GLUCOTROL) 5 MG tablet TAKE 1 TABLET(5 MG) BY MOUTH TWICE DAILY BEFORE A MEAL (Patient taking differently: Take 5 mg by mouth 2 (two) times daily before a meal.) 180 tablet 1  . HYDROcodone-acetaminophen (NORCO/VICODIN) 5-325 MG tablet Take 1 tablet by mouth every 6 (six) hours as needed for severe pain. 20 tablet 0  . iron polysaccharides (NIFEREX) 150 MG capsule Take 1 capsule (150 mg total) by mouth daily. 90 capsule 1  . latanoprost (XALATAN) 0.005 % ophthalmic solution Place 1 drop into both eyes at bedtime.    . losartan (COZAAR) 50 MG tablet Take 50 mg by mouth daily.    . metFORMIN (GLUCOPHAGE) 500 MG tablet Take 1 tablet (500 mg total) by mouth 2 (two) times daily with a meal. 180 tablet 3  . metoprolol succinate (TOPROL-XL) 50 MG 24 hr tablet TAKE 1 TABLET BY MOUTH EVERY DAY TAKE IMMEDIATELY FOLLOWING A MEAL (Patient taking differently: Take 50 mg by mouth in the morning and at bedtime.) 90 tablet 1  . Multiple Vitamin (MULTIVITAMIN) tablet Take 1 tablet by mouth daily.    . hydrochlorothiazide (HYDRODIURIL) 25 MG tablet Take 1 tablet (25 mg total) by mouth daily. 90 tablet 3  . senna-docusate (SENOKOT-S) 8.6-50 MG tablet Take 1 tablet by mouth 2 (two) times daily between meals as needed for mild constipation. (Patient not taking: Reported on 05/15/2020) 60 tablet 0   No current facility-administered medications for this visit.    Allergies  Allergen Reactions  . Lisinopril Cough  . Metformin And Related     Diarrhea on 1000mg BID, tolerates 500 mg BID    Social History   Socioeconomic History  . Marital status: Married    Spouse name: Not on file  . Number of children: 3  . Years of education: Not on file  . Highest education level: Some college, no degree  Occupational History  . Occupation: retired  Tobacco  Use  . Smoking status: Former Smoker    Packs/day: 0.50    Years: 20.00    Pack years: 10.00    Types: Cigarettes    Quit date: 07/13/1981    Years since quitting: 38.8  . Smokeless tobacco: Never Used  Vaping Use  . Vaping Use: Never used  Substance and Sexual Activity  . Alcohol use: No  . Drug use: No  . Sexual activity: Yes  Other Topics Concern  . Not on file  Social History Narrative   SHE IS MARRIED ,MOTHER OF 4, GRANDMOTHER OF 6. SHE DOES EXERCISE BUT SOMEWHAT LIMITED BECAUSE OF HER KNEE (S/P SURGERY 2015).   SHE QUIT SMOKING IN 1980 AND SHE DOES NOT DRINK   Social Determinants of Health   Financial Resource Strain: Not on file  Food Insecurity:   Not on file  Transportation Needs: Not on file  Physical Activity: Not on file  Stress: Not on file  Social Connections: Not on file  Intimate Partner Violence: Not on file    Review of Systems  Constitutional: Negative.  Negative for chills and fever.  HENT: Negative.  Negative for congestion and sore throat.   Respiratory: Negative.  Negative for cough and shortness of breath.   Cardiovascular: Negative.  Negative for chest pain and palpitations.  Gastrointestinal: Positive for nausea and vomiting. Negative for abdominal pain, blood in stool, diarrhea and melena.  Skin: Negative.  Negative for rash.  Neurological: Negative for dizziness and headaches.  All other systems reviewed and are negative.   Objective  Alert and oriented x3 in no apparent respiratory distress Vitals as reported by the patient: Today's Vitals   05/15/20 0849  Weight: 186 lb (84.4 kg)  Height: 5' 4" (1.626 m)    There are no diagnoses linked to this encounter. Angela Chambers was seen today for emesis and nausea.  Diagnoses and all orders for this visit:  Intractable vomiting with nausea, unspecified vomiting type -     ondansetron (ZOFRAN ODT) 4 MG disintegrating tablet; Take 1 tablet (4 mg total) by mouth every 8 (eight) hours as needed for  nausea or vomiting.  Medication side effects  Closed fracture of right femur, unspecified fracture morphology, unspecified portion of femur, initial encounter ([place])  Hypertension associated with diabetes ([place])  Dyslipidemia associated with type 2 diabetes mellitus ([place])   Clinically stable.  No red flag signs or symptoms. Hydrocodone side effects.  Will use Zofran ODT as needed. Advised to contact the office if no better or worse during the next several days.  I discussed the assessment and treatment plan with the patient. The patient was provided an opportunity to ask questions and all were answered. The patient agreed with the plan and demonstrated an understanding of the instructions.   The patient was advised to call back or seek an in-person evaluation if the symptoms worsen or if the condition fails to improve as anticipated.  I provided 20 minutes of non-face-to-face time during this encounter.  Horald Pollen, MD  Primary Care at [facility]

## 2020-05-17 NOTE — Telephone Encounter (Signed)
Pt scheduled for HOS appointment soon

## 2020-05-18 ENCOUNTER — Other Ambulatory Visit: Payer: Self-pay

## 2020-05-18 ENCOUNTER — Encounter (HOSPITAL_COMMUNITY): Payer: Self-pay

## 2020-05-18 ENCOUNTER — Emergency Department (HOSPITAL_COMMUNITY)
Admission: EM | Admit: 2020-05-18 | Discharge: 2020-05-18 | Disposition: A | Discharge status: Home / Self Care | Payer: Medicare Other | Attending: Emergency Medicine | Admitting: Emergency Medicine

## 2020-05-18 DIAGNOSIS — I119 Hypertensive heart disease without heart failure: Secondary | ICD-10-CM | POA: Insufficient documentation

## 2020-05-18 DIAGNOSIS — N390 Urinary tract infection, site not specified: Secondary | ICD-10-CM | POA: Diagnosis not present

## 2020-05-18 DIAGNOSIS — R000 Tachycardia, unspecified: Secondary | ICD-10-CM | POA: Insufficient documentation

## 2020-05-18 DIAGNOSIS — Z79899 Other long term (current) drug therapy: Secondary | ICD-10-CM | POA: Insufficient documentation

## 2020-05-18 DIAGNOSIS — E119 Type 2 diabetes mellitus without complications: Secondary | ICD-10-CM | POA: Insufficient documentation

## 2020-05-18 DIAGNOSIS — R195 Other fecal abnormalities: Secondary | ICD-10-CM | POA: Diagnosis present

## 2020-05-18 DIAGNOSIS — T83511A Infection and inflammatory reaction due to indwelling urethral catheter, initial encounter: Secondary | ICD-10-CM | POA: Insufficient documentation

## 2020-05-18 DIAGNOSIS — Z7984 Long term (current) use of oral hypoglycemic drugs: Secondary | ICD-10-CM | POA: Diagnosis not present

## 2020-05-18 DIAGNOSIS — Z87891 Personal history of nicotine dependence: Secondary | ICD-10-CM | POA: Insufficient documentation

## 2020-05-18 DIAGNOSIS — Z96652 Presence of left artificial knee joint: Secondary | ICD-10-CM | POA: Insufficient documentation

## 2020-05-18 DIAGNOSIS — K625 Hemorrhage of anus and rectum: Secondary | ICD-10-CM | POA: Insufficient documentation

## 2020-05-18 LAB — COMPREHENSIVE METABOLIC PANEL
ALT: 28 U/L (ref 0–44)
AST: 27 U/L (ref 15–41)
Albumin: 3.3 g/dL — ABNORMAL LOW (ref 3.5–5.0)
Alkaline Phosphatase: 102 U/L (ref 38–126)
Anion gap: 14 (ref 5–15)
BUN: 25 mg/dL — ABNORMAL HIGH (ref 8–23)
CO2: 25 mmol/L (ref 22–32)
Calcium: 9.7 mg/dL (ref 8.9–10.3)
Chloride: 98 mmol/L (ref 98–111)
Creatinine, Ser: 1.1 mg/dL — ABNORMAL HIGH (ref 0.44–1.00)
GFR, Estimated: 51 mL/min — ABNORMAL LOW (ref >60)
Glucose, Bld: 185 mg/dL — ABNORMAL HIGH (ref 70–99)
Potassium: 3.6 mmol/L (ref 3.5–5.1)
Sodium: 137 mmol/L (ref 135–145)
Total Bilirubin: 2.4 mg/dL — ABNORMAL HIGH (ref 0.3–1.2)
Total Protein: 6.4 g/dL — ABNORMAL LOW (ref 6.5–8.1)

## 2020-05-18 LAB — URINALYSIS, ROUTINE W REFLEX MICROSCOPIC
Bilirubin Urine: NEGATIVE
Glucose, UA: NEGATIVE mg/dL
Hgb urine dipstick: NEGATIVE
Ketones, ur: 20 mg/dL — AB
Nitrite: NEGATIVE
Protein, ur: 100 mg/dL — AB
Specific Gravity, Urine: 1.02 (ref 1.005–1.030)
pH: 9 — ABNORMAL HIGH (ref 5.0–8.0)

## 2020-05-18 LAB — HEMOGLOBIN AND HEMATOCRIT, BLOOD
HCT: 32.8 % — ABNORMAL LOW (ref 36.0–46.0)
Hemoglobin: 11 g/dL — ABNORMAL LOW (ref 12.0–15.0)

## 2020-05-18 LAB — TYPE AND SCREEN
ABO/RH(D): O POS
Antibody Screen: NEGATIVE

## 2020-05-18 LAB — CBC WITH DIFFERENTIAL/PLATELET
Abs Immature Granulocytes: 0.02 10*3/uL (ref 0.00–0.07)
Basophils Absolute: 0 10*3/uL (ref 0.0–0.1)
Basophils Relative: 1 %
Eosinophils Absolute: 0.1 10*3/uL (ref 0.0–0.5)
Eosinophils Relative: 1 %
HCT: 32.9 % — ABNORMAL LOW (ref 36.0–46.0)
Hemoglobin: 11 g/dL — ABNORMAL LOW (ref 12.0–15.0)
Immature Granulocytes: 0 %
Lymphocytes Relative: 18 %
Lymphs Abs: 1.5 10*3/uL (ref 0.7–4.0)
MCH: 28.7 pg (ref 26.0–34.0)
MCHC: 33.4 g/dL (ref 30.0–36.0)
MCV: 85.9 fL (ref 80.0–100.0)
Monocytes Absolute: 0.9 10*3/uL (ref 0.1–1.0)
Monocytes Relative: 11 %
Neutro Abs: 5.7 10*3/uL (ref 1.7–7.7)
Neutrophils Relative %: 69 %
Platelets: 319 10*3/uL (ref 150–400)
RBC: 3.83 MIL/uL — ABNORMAL LOW (ref 3.87–5.11)
RDW: 15 % (ref 11.5–15.5)
WBC: 8.3 10*3/uL (ref 4.0–10.5)
nRBC: 0 % (ref 0.0–0.2)

## 2020-05-18 LAB — PROTIME-INR
INR: 1.1 (ref 0.8–1.2)
Prothrombin Time: 13.5 seconds (ref 11.4–15.2)

## 2020-05-18 LAB — POC OCCULT BLOOD, ED: Fecal Occult Bld: NEGATIVE

## 2020-05-18 MED ORDER — ONDANSETRON HCL 4 MG PO TABS: 4.0000 mg | ORAL_TABLET | Freq: Four times a day (QID) | ORAL | 0 refills | Status: DC

## 2020-05-18 MED ORDER — SODIUM CHLORIDE 0.9 % IV SOLN
1.0000 g | Freq: Once | INTRAVENOUS | Status: AC
  Administered 2020-05-18: 09:00:00 1 g via INTRAVENOUS
  Filled 2020-05-18: qty 10

## 2020-05-18 MED ORDER — HYDROCORTISONE (PERIANAL) 2.5 % EX CREA: 1.0000 "application " | TOPICAL_CREAM | Freq: Two times a day (BID) | CUTANEOUS | 0 refills | Status: DC

## 2020-05-18 MED ORDER — SODIUM CHLORIDE 0.9 % IV BOLUS
1000.0000 mL | Freq: Once | INTRAVENOUS | Status: AC
  Administered 2020-05-18: 08:00:00 1000 mL via INTRAVENOUS

## 2020-05-18 MED ORDER — CEPHALEXIN 500 MG PO CAPS: 500.0000 mg | ORAL_CAPSULE | Freq: Four times a day (QID) | ORAL | 0 refills | Status: DC

## 2020-05-18 MED ORDER — SODIUM CHLORIDE 0.9 % IV SOLN: INTRAVENOUS | Status: DC

## 2020-05-18 NOTE — ED Provider Notes (Signed)
[facility] EMERGENCY DEPARTMENT Provider Note   CSN: [id number] Arrival date & time: 05/18/20  7588     History Chief Complaint  Patient presents with  . GI Problem    Angela Chambers is a 79 y.o. female.  Pt presents to the ED today with bloody diarrhea.  The pt was seen by me on 12/9 at [facility].  She had a fall and fractured her distal femur around her prior prosthesis of her knee.  She was transferred to [facility] and had surgery on 12/10.  Her post op stay was caused by acute urinary retention requiring a foley and anemia.  She had a foley placed on 12/12.  She failed a voiding trial on 12/14, so the foley was replaced.  She received 2 units of blood on 12/12 and IV Feraheme on 12/11.  Stool guaiac negative times 2.  Pt was d/c on 12/14.  The pt said she's had bloody diarrhea since d/c.  She has been feeling dizzy and nauseous.  She was d/c with lovenox injections which she's been taking.        Past Medical History:  Diagnosis Date  . Arthritis   . Cataract   . Diabetes mellitus   . Diabetes mellitus without complication ([place])    Phreesia 05/15/2020  . Fibroids   . Glaucoma   . Glaucoma    Phreesia 05/15/2020  . Hyperlipidemia   . Hypertension 07 /2013    WITH EF GREATER THAN 55 % WITH MILD MR BUT NO PROLASPE ,ESSENTIALLY NORMAL DONE TO EVALUATE PALPITATIONS .  Marland Kitchen Obesity (BMI 30.0-34.9)   . Osteoporosis   . Palpitations    CONTROLLED ON BETA BLOCKER  . Symptomatic anemia post operative blood loss 05/12/2020    Patient Active Problem List   Diagnosis Date Noted  . Symptomatic anemia post operative blood loss 05/12/2020  . Femur fracture ([place]) 05/09/2020  . Hypertension associated with diabetes ([place]) 08/16/2018  . Abnormal stress echo EKG portion with normal echo -> imaging confirmed the correct with normal coronaries 06/21/2015  . OA (osteoarthritis) of knee 09/25/2013  . Obesity (BMI 30.0-34.9)   . Hypertensive heart disease without CHF  11/30/2011  . Hyperlipidemia 08/25/2011  . Other and combined forms of senile cataract 07/04/2011  . Primary open angle glaucoma of both eyes, severe stage 07/04/2011  . Dyslipidemia associated with type 2 diabetes mellitus ([place]) 02/22/2009  . PERSONAL HISTORY OF COLONIC POLYPS 02/22/2009    Past Surgical History:  Procedure Laterality Date  . ABDOMINAL HYSTERECTOMY  10/18/ 1982   TAH-BSO for fibroids and adenomatous hyperplasia  . CARDIAC CATHETERIZATION  08/2009   NONOBSTRUCTIVE CAD WITH 20% IN THE LAD AND 20 % IN THE RCA NORMAL EF 65%  . CARDIAC CATHETERIZATION N/A 07/05/2015   Procedure: Left Heart Cath and Coronary Angiography;  Surgeon: Leonie Man, MD;  Location: [place] [facility];  Service: Cardiovascular: Angiographically minimal CAD. EF 60-65%  . CPET-MET Test (Cardiopulmonary Exercise Test)  03/06/2013   Adequate effort at 1.11. Peak VO2 12.3 is 86% in the normal range. Heart rate was just outside the normal range target being 127 beats a minute but this was 86% of projected. She did have an ischemic pattern after the anterograde threshold with increased heart rate and blunting of stroke volume during the last 3 minutes of exercise, but was asymptomatic.  Marland Kitchen JOINT REPLACEMENT  09/05/2009   L knee  . KNEE SURGERY  1984   per pt screws in  R knee  . NM MYOVIEW LTD  06/2015   FALSE + GXT - Images Accurate:  TM - 6 min, 7 METs (93% MPHR) --> chest discomfort, notable ST depressions => Hight Risk Duke TM Score, but NO scintigraphic evidence of ischemia or infarction. EF 74$  . ORIF FEMUR FRACTURE Right 05/10/2020   Procedure: OPEN REDUCTION INTERNAL FIXATION (ORIF) DISTAL FEMUR FRACTURE;  Surgeon: Shona Needles, MD;  Location: [place];  Service: Orthopedics;  Laterality: Right;  . 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 TRANSTHORACIC  ECHOCARDIOGRAM  July 2013   EF greater than 55%, mild MR no prolapse.  Normal  . TUBAL LIGATION       OB History   No obstetric history on file.     Family History  Problem Relation Age of Onset  . Diabetes Mother   . Heart disease Mother   . Hyperlipidemia Mother   . Diabetes Sister   . Hyperlipidemia Sister   . Hypertension Sister   . Diabetes Brother   . Hyperlipidemia Brother   . Prostate cancer Son        prostate  . Hyperlipidemia Sister     Social History   Tobacco Use  . Smoking status: Former Smoker    Packs/day: 0.50    Years: 20.00    Pack years: 10.00    Types: Cigarettes    Quit date: 07/13/1981    Years since quitting: 38.8  . Smokeless tobacco: Never Used  Vaping Use  . Vaping Use: Never used  Substance Use Topics  . Alcohol use: No  . Drug use: No    Home Medications Prior to Admission medications   Medication Sig Start Date End Date Taking? Authorizing Provider  acetaminophen (TYLENOL) 500 MG tablet Take 2 tablets (1,000 mg total) by mouth every 8 (eight) hours. 05/14/20   Mercy Riding, MD  amLODipine (NORVASC) 5 MG tablet TAKE 1 TABLET(5 MG) BY MOUTH DAILY Patient taking differently: Take 5 mg by mouth daily. 01/31/20   Horald Pollen, MD  aspirin 81 MG tablet Take 81 mg by mouth daily.    [provider]  atorvastatin (LIPITOR) 20 MG tablet Take 1 tablet (20 mg total) by mouth daily. 08/29/19   Horald Pollen, MD  bethanechol (URECHOLINE) 10 MG tablet Take 1 tablet (10 mg total) by mouth 3 (three) times daily. 05/14/20   Mercy Riding, MD  Blood Glucose Monitoring Suppl (BLOOD GLUCOSE MONITOR SYSTEM) w/Device KIT 1 Units by Does not apply route 2 (two) times daily. 02/07/18   Tenna Delaine D, PA-C  brimonidine (ALPHAGAN) 0.2 % ophthalmic solution Place 1 drop into both eyes 2 (two) times daily. 05/06/20   [provider]  cephALEXin (KEFLEX) 500 MG capsule Take 1 capsule (500 mg total) by mouth 4 (four) times  daily. 05/18/20   Isla Pence, MD  Co-Enzyme Q-10 30 MG CAPS Take 30 mg by mouth daily.    [provider]  dorzolamide-timolol (COSOPT) 22.3-6.8 MG/ML ophthalmic solution Place 1 drop into both eyes 2 (two) times daily. 09/23/17   [provider]  enoxaparin (LOVENOX) 40 MG/0.4ML injection Inject 0.4 mLs (40 mg total) into the skin daily for 28 days. 05/13/20 06/10/20  Delray Alt, PA-C  glipiZIDE (GLUCOTROL) 5 MG tablet TAKE 1 TABLET(5 MG) BY MOUTH TWICE DAILY BEFORE A MEAL Patient  taking differently: Take 5 mg by mouth 2 (two) times daily before a meal. 04/15/20   Sagardia, Ines Bloomer, MD  hydrochlorothiazide (HYDRODIURIL) 25 MG tablet Take 1 tablet (25 mg total) by mouth daily. 08/22/19 11/20/19  Horald Pollen, MD  HYDROcodone-acetaminophen (NORCO/VICODIN) 5-325 MG tablet Take 1 tablet by mouth every 6 (six) hours as needed for severe pain. 05/13/20   Delray Alt, PA-C  hydrocortisone (ANUSOL-HC) 2.5 % rectal cream Place 1 application rectally 2 (two) times daily. 05/18/20   Isla Pence, MD  iron polysaccharides (NIFEREX) 150 MG capsule Take 1 capsule (150 mg total) by mouth daily. 05/14/20   Mercy Riding, MD  latanoprost (XALATAN) 0.005 % ophthalmic solution Place 1 drop into both eyes at bedtime. 09/21/18   [provider]  losartan (COZAAR) 50 MG tablet Take 50 mg by mouth daily.    [provider]  metFORMIN (GLUCOPHAGE) 500 MG tablet Take 1 tablet (500 mg total) by mouth 2 (two) times daily with a meal. 08/22/19 11/20/19  Sagardia, Ines Bloomer, MD  metoprolol succinate (TOPROL-XL) 50 MG 24 hr tablet TAKE 1 TABLET BY MOUTH EVERY DAY TAKE IMMEDIATELY FOLLOWING A MEAL Patient taking differently: Take 50 mg by mouth in the morning and at bedtime. 01/23/20   Leonie Man, MD  Multiple Vitamin (MULTIVITAMIN) tablet Take 1 tablet by mouth daily.    [provider]  ondansetron (ZOFRAN ODT) 4 MG disintegrating tablet Take 1 tablet (4  mg total) by mouth every 8 (eight) hours as needed for nausea or vomiting. 05/15/20   Horald Pollen, MD  ondansetron (ZOFRAN) 4 MG tablet Take 1 tablet (4 mg total) by mouth every 6 (six) hours. 05/18/20   Isla Pence, MD  senna-docusate (SENOKOT-S) 8.6-50 MG tablet Take 1 tablet by mouth 2 (two) times daily between meals as needed for mild constipation. Patient not taking: Reported on 05/15/2020 05/14/20   Mercy Riding, MD    Allergies    Lisinopril and Metformin and related  Review of Systems   Review of Systems  Gastrointestinal: Positive for nausea.       Bloody diarrhea  Neurological: Positive for dizziness and weakness.  All other systems reviewed and are negative.   Physical Exam Updated Vital Signs BP (!) 149/72 (BP Location: Right Arm)   Pulse 72   Temp 98.2 F (36.8 C) (Oral)   Resp 18   Ht _0  (1.626 m)   Wt 84.4 kg   LMP  (LMP Unknown)   SpO2 97%   BMI 31.94 kg/m   Physical Exam Vitals and nursing note reviewed.  Constitutional:      Appearance: Normal appearance.  HENT:     Head: Normocephalic and atraumatic.     Right Ear: External ear normal.     Left Ear: External ear normal.     Nose: Nose normal.     Mouth/Throat:     Mouth: Mucous membranes are dry.  Eyes:     Extraocular Movements: Extraocular movements intact.     Conjunctiva/sclera: Conjunctivae normal.     Pupils: Pupils are equal, round, and reactive to light.  Cardiovascular:     Rate and Rhythm: Regular rhythm. Tachycardia present.     Pulses: Normal pulses.     Heart sounds: Normal heart sounds.  Pulmonary:     Effort: Pulmonary effort is normal.     Breath sounds: Normal breath sounds.  Abdominal:     General: Abdomen is flat. Bowel sounds are  normal.     Palpations: Abdomen is soft.  Genitourinary:    Rectum: Guaiac result negative.  Musculoskeletal:     Cervical back: Normal range of motion and neck supple.     Comments: Right femur surgical site incisions look  good.  No surrounding cellulitis or drainage.  Skin:    General: Skin is warm.     Capillary Refill: Capillary refill takes less than 2 seconds.  Neurological:     General: No focal deficit present.     Mental Status: She is alert and oriented to person, place, and time.  Psychiatric:        Mood and Affect: Mood normal.        Behavior: Behavior normal.     ED Results / Procedures / Treatments   Labs (all labs ordered are listed, but only abnormal results are displayed) Labs Reviewed  COMPREHENSIVE METABOLIC PANEL - Abnormal; Notable for the following components:      Result Value   Glucose, Bld 185 (*)    BUN 25 (*)    Creatinine, Ser 1.10 (*)    Total Protein 6.4 (*)    Albumin 3.3 (*)    Total Bilirubin 2.4 (*)    GFR, Estimated 51 (*)    All other components within normal limits  CBC WITH DIFFERENTIAL/PLATELET - Abnormal; Notable for the following components:   RBC 3.83 (*)    Hemoglobin 11.0 (*)    HCT 32.9 (*)    All other components within normal limits  URINALYSIS, ROUTINE W REFLEX MICROSCOPIC - Abnormal; Notable for the following components:   Color, Urine AMBER (*)    APPearance CLOUDY (*)    pH 9.0 (*)    Ketones, ur 20 (*)    Protein, ur 100 (*)    Leukocytes,Ua LARGE (*)    Bacteria, UA RARE (*)    All other components within normal limits  HEMOGLOBIN AND HEMATOCRIT, BLOOD - Abnormal; Notable for the following components:   Hemoglobin 11.0 (*)    HCT 32.8 (*)    All other components within normal limits  URINE CULTURE  PROTIME-INR  POC OCCULT BLOOD, ED  TYPE AND SCREEN    EKG None  Radiology No results found.  Procedures Procedures (including critical care time)  Medications Ordered in ED Medications  sodium chloride 0.9 % bolus 1,000 mL (0 mLs Intravenous Stopped 05/18/20 0928)    And  0.9 %  sodium chloride infusion ( Intravenous New Bag/Given 05/18/20 0828)  cefTRIAXone (ROCEPHIN) 1 g in sodium chloride 0.9 % 100 mL IVPB (0 g  Intravenous Stopped 05/18/20 1117)    ED Course  I have reviewed the triage vital signs and the nursing notes.  Pertinent labs & imaging results that were available during my care of the patient were reviewed by me and considered in my medical decision making (see chart for details).    MDM Rules/Calculators/A&P                          Pt has not had any stool while here.  Guaiac is negative.  Stool is brown.  Pt has been given food and drink and is able to keep fluids down.  H/h better than d/c and remains stable with a recheck.  Pt may have an internal hemorrhoid which is intermittently bleeding.  She is given anusol.  She is also developing a uti from her catheter, so is given rocephin in ED and  d/c on keflex.  Urine sent for cx.  Pt is feeling better.  She is stable for d/c.  Return if worse.   Final Clinical Impression(s) / ED Diagnoses Final diagnoses:  Rectal bleeding  Urinary tract infection associated with indwelling urethral catheter, initial encounter ([place])    Rx / DC Orders ED Discharge Orders         Ordered    hydrocortisone (ANUSOL-HC) 2.5 % rectal cream  2 times daily        05/18/20 1225    cephALEXin (KEFLEX) 500 MG capsule  4 times daily        05/18/20 1225    ondansetron (ZOFRAN) 4 MG tablet  Every 6 hours        05/18/20 1241           Isla Pence, MD 05/18/20 1242

## 2020-05-18 NOTE — ED Triage Notes (Signed)
Patient coming from home, sx to R femur on 12/9, nausea x 2 days, bloody diarrhea starting Thursday, dizziness, foley in place on arrival.   EMS Vital  116 HR 140/80  CBG 225 97.9 temporal  99% RA

## 2020-05-20 LAB — URINE CULTURE: Culture: >=100000 — AB

## 2020-05-21 ENCOUNTER — Telehealth: Payer: Self-pay

## 2020-05-21 NOTE — Telephone Encounter (Signed)
Post ED Visit - Positive Culture Follow-up  Culture report reviewed by antimicrobial stewardship pharmacist: [place] Team []  Elenor Quinones, Pharm.D. []  Heide Guile, Pharm.D., BCPS AQ-ID []  Parks Neptune, Pharm.D., BCPS []  Alycia Rossetti, Pharm.D., BCPS []  Levasy, Pharm.D., BCPS, AAHIVP []  Legrand Como, Pharm.D., BCPS, AAHIVP []  Salome Arnt, PharmD, BCPS []  Johnnette Gourd, PharmD, BCPS []  Hughes Better, PharmD, BCPS []  Leeroy Cha, PharmD []  Laqueta Linden, PharmD, BCPS []  Albertina Parr, PharmD [place] Team []  Leodis Sias, PharmD []  Lindell Spar, PharmD []  Royetta Asal, PharmD []  Graylin Shiver, Rph []  Rema Fendt) Glennon Mac, PharmD []  Arlyn Dunning, PharmD []  Netta Cedars, PharmD []  Dia Sitter, PharmD []  Leone Haven, PharmD []  Gretta Arab, PharmD []  Theodis Shove, PharmD []  Peggyann Juba, PharmD []  Reuel Boom, PharmD   Positive urine culture Treated with Cephalexin, organism sensitive to the same and no further patient follow-up is required at this time.  Genia Del 05/21/2020, 10:44 AM

## 2020-05-22 ENCOUNTER — Ambulatory Visit: Payer: Medicare Other | Admitting: Emergency Medicine

## 2020-05-22 ENCOUNTER — Inpatient Hospital Stay: Payer: Medicare Other | Admitting: Emergency Medicine

## 2020-05-30 ENCOUNTER — Telehealth: Payer: Self-pay | Admitting: Emergency Medicine

## 2020-05-30 NOTE — Telephone Encounter (Signed)
Called back Okayed for 5 weeks of once a week nursing

## 2020-05-30 NOTE — Telephone Encounter (Signed)
Beth from [facility] called wanting  an order for once a week for 5 weeks  Best call back number [phone] Please advise.

## 2020-06-06 ENCOUNTER — Telehealth: Payer: Self-pay | Admitting: Emergency Medicine

## 2020-06-06 NOTE — Telephone Encounter (Signed)
[facility] nurse called and stated pt is having steady bleeding in vaginal area/ stated that pt has smell and brown discharge. Pt just had a new catheter put in but urin is clear it is coming from vaginal area. Please advise. Nurses phone number: [phone]

## 2020-06-10 ENCOUNTER — Other Ambulatory Visit: Payer: Self-pay | Admitting: Emergency Medicine

## 2020-06-10 DIAGNOSIS — E7849 Other hyperlipidemia: Secondary | ICD-10-CM

## 2020-06-10 DIAGNOSIS — I10 Essential (primary) hypertension: Secondary | ICD-10-CM

## 2020-06-10 DIAGNOSIS — I119 Hypertensive heart disease without heart failure: Secondary | ICD-10-CM

## 2020-06-10 DIAGNOSIS — E119 Type 2 diabetes mellitus without complications: Secondary | ICD-10-CM

## 2020-06-11 NOTE — Telephone Encounter (Signed)
Please advise this is a home bound patient and would not be able to come into the office.

## 2020-06-11 NOTE — Telephone Encounter (Signed)
Needs to be examined in an urgent care or emergency department setting.  Thanks.

## 2020-06-12 NOTE — Telephone Encounter (Signed)
Spoke with the Nurse at [facility] health care and she was informed that the patient would need to go to the Urgent care or the ER. The nurse stated that the issue has been resolved. The bleeding was due to the catheter but she has been seen by Urology and they pulled out the catheter.

## 2020-06-19 ENCOUNTER — Telehealth: Payer: Self-pay | Admitting: *Deleted

## 2020-06-19 NOTE — Telephone Encounter (Signed)
Faxed signed to [place] Attn: Bettina Gavia. Confirmation at 12:31 pm.

## 2020-07-11 ENCOUNTER — Encounter: Payer: Self-pay | Admitting: Cardiology

## 2020-07-11 ENCOUNTER — Telehealth (INDEPENDENT_AMBULATORY_CARE_PROVIDER_SITE_OTHER): Payer: Medicare Other | Admitting: Cardiology

## 2020-07-11 VITALS — BP 117/69 | HR 92 | Wt 187.0 lb

## 2020-07-11 DIAGNOSIS — E785 Hyperlipidemia, unspecified: Secondary | ICD-10-CM | POA: Diagnosis not present

## 2020-07-11 DIAGNOSIS — E1169 Type 2 diabetes mellitus with other specified complication: Secondary | ICD-10-CM | POA: Diagnosis not present

## 2020-07-11 DIAGNOSIS — I119 Hypertensive heart disease without heart failure: Secondary | ICD-10-CM

## 2020-07-11 DIAGNOSIS — R9439 Abnormal result of other cardiovascular function study: Secondary | ICD-10-CM | POA: Diagnosis not present

## 2020-07-11 NOTE — Assessment & Plan Note (Addendum)
She should be due for lipids to be checked-likely by PCP.  She supposed be seeing him in the next month or 2.  (Intended to contact him following her hospitalization).  Last lipids in December 2020 showed well-controlled lipids for her risk factors.  LDL was less than 100. Vitamin D remains stable would continue with a atorvastatin 20 mg daily.  Does not appear to be on metformin any further.  Will defer to PCP if there is any further management.

## 2020-07-11 NOTE — Assessment & Plan Note (Signed)
Blood pressures are well controlled today.  She is taking her Toprol, losartan along with amlodipine and HCTZ.  No edema.  No PND orthopnea.  Seems euvolemic.  We will continue HCTZ-no indication for loop diuretic.

## 2020-07-11 NOTE — Patient Instructions (Signed)
Medication Instructions:   None  *If you need a refill on your cardiac medications before your next appointment, please call your pharmacy*   Lab Work:   You should be due to get your cholesterol levels checked along with other basic labs.  Probably can be done when you see your primary care physician.  If you have labs (blood work) drawn today and your tests are completely normal, you will receive your results only by: Marland Kitchen MyChart Message (if you have MyChart) OR . A paper copy in the mail If you have any lab test that is abnormal or we need to change your treatment, we will call you to review the results.   Testing/Procedures:  None   Follow-Up: At [facility], you and your health needs are our priority.  As part of our continuing mission to provide you with exceptional heart care, we have created designated Provider Care Teams.  These Care Teams include your primary Cardiologist (physician) and Advanced Practice Providers (APPs -  Physician Assistants and Nurse Practitioners) who all work together to provide you with the care you need, when you need it.  We recommend signing up for the patient portal called "MyChart".  Sign up information is provided on this After Visit Summary.  MyChart is used to connect with patients for Virtual Visits (Telemedicine).  Patients are able to view lab/test results, encounter notes, upcoming appointments, etc.  Non-urgent messages can be sent to your provider as well.   To learn more about what you can do with MyChart, go to [email or web address].    Your next appointment:   10 month(s) (in November-December 2022)  The format for your next appointment:   In Person  Provider:   Glenetta Hew, MD   Other Instructions Keep staying active -> very happy to see that you are doing well after surgery   HAPPY [birth year] 80TH BIRTHDAY -- !!!!!!!

## 2020-07-11 NOTE — Progress Notes (Signed)
Virtual Visit via Video Note   This visit type was conducted due to national recommendations for restrictions regarding the COVID-19 Pandemic (e.g. social distancing) in an effort to limit this patient's exposure and mitigate transmission in our community.  Due to her co-morbid illnesses, this patient is at least at moderate risk for complications without adequate follow up.  This format is felt to be most appropriate for this patient at this time.  All issues noted in this document were discussed and addressed.  A limited physical exam was performed with this format.  Please refer to the patient's chart for her consent to telehealth for [facility].      Patient has given verbal permission to conduct this visit via virtual appointment and to bill insurance 07/11/2020 12:46 PM     Evaluation Performed:  Follow-up visit  Date:  07/11/2020   ID:  Angela, Chambers 1940-08-30, MRN [id number]  Patient Location: Home Provider Location: Other:  Hospital office  PCP:  Horald Pollen, MD  Cardiologist:  No primary care provider on file.  Electrophysiologist:  None    Chief Complaint  Patient presents with  . Follow-up    At last appointment did not happen because of her fall and femur fracture.  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  She is taking her Toprol, losartan along with amlodipine and HCTZ.  No edema.  No PND orthopnea.  Seems euvolemic.  We will continue HCTZ-no indication for loop diuretic.      Abnormal stress echo EKG portion with normal echo -> imaging confirmed the correct with normal coronaries (Chronic)    EKG was abnormal, echocardiogram did not suggest ischemia.  Cardiac catheterization showed minimal CAD.      Dyslipidemia associated with type 2 diabetes mellitus  ([place]) - Primary (Chronic)    She should be due for lipids to be checked-likely by PCP.  She supposed be seeing him in the next month or 2.  (Intended to contact him following her hospitalization).  Last lipids in December 2020 showed well-controlled lipids for her risk factors.  LDL was less than 100. Vitamin D remains stable would continue with a atorvastatin 20 mg daily.  Does not appear to be on metformin any further.  Will defer to PCP if there is any further management.         ====================================  History of Present Illness:    Angela Chambers is a 80 y.o. female with PMH notable for Hypertensive Heart Disease Without Heart Failure, DM-2, and Hyperlipidemia  who presents via audio/video conferencing for a telehealth visit today as a as an annual follow-up.  Angela Chambers was last seen May 08, 2019.  No major issues.  Blood pressure is little high but otherwise okay.  Was frustrated about not being able to go to the gym because of COVID-19.  Maybe some mild edema but nothing significant.  Otherwise no cardiac symptoms. => No changes made.  Asked her to pay attention to her BP, and let us know if it increased or stayed high.  Hospitalizations:  . 12/ 9-14/2021: Admitted after falling and fracturing right leg (distal right femur).  She actually fell while walking in the parking lot of our office, she went to step up onto the sidewalk, and missed the sidewalk, her foot  rolled and she fell breaking her leg. => X-rays showed comminuted displaced and angulated distal right femur metaphyseal fracture extending to the right knee prosthesis. =>  o On 05/11/2019 when she had ORIF of distal femoral fracture by Dr. Doreatha Martin. ->  Had postop bleeding, received 2 units of blood. o Postop course was complicated by urinary retention requiring Foley catheter.  05/18/2020: ER visit for UTI.  Was confused and disoriented.  Nauseated => treated with Rocephin in the ER and  discharged on Keflex  Recent - Interim CV studies:    The following studies were reviewed today: . None:  Inerval History   Angela Chambers presents via telemedicine today for make up visit from the planned visit on December 9th.  This would amend the annual follow-up.  She has recovered markedly well from her surgery.  She has been working diligently with physical therapy.  She is now able to take the stairs up to her bedroom without difficulty.  Other than the UTI that have been a week or so after her surgery, she has been doing fine.  She has not had any major issues.  She is gradually getting energy level back.  Pain is well controlled with extra strength Tylenol with as needed Aleve.  She has not had any dyspnea or chest discomfort with rest or exertion.  Cardiovascular ROS: no chest pain or dyspnea on exertion positive for - A little bit of swelling, more on the right leg after her injury, but not significant negative for - irregular heartbeat, orthopnea, palpitations, paroxysmal nocturnal dyspnea, rapid heart rate, shortness of breath or Lightheadedness or dizziness, syncope/near syncope or TIA/amaurosis fugax, claudication   ROS:  Please see the history of present illness.    The patient does not have symptoms concerning for COVID-19 infection (fever, chills, cough, or new shortness of breath).  Review of Systems  Constitutional: Negative for malaise/fatigue and weight loss.  HENT: Negative for nosebleeds.   Respiratory: Negative for shortness of breath.   Gastrointestinal: Negative for blood in stool, constipation and melena.  Genitourinary: Negative for hematuria.       No further UTI symptoms.  Musculoskeletal: Positive for joint pain.       The right leg still hurts on, but pretty well-controlled pain  Neurological: Negative for dizziness, focal weakness and weakness.  Psychiatric/Behavioral: Negative for memory loss. The patient is not nervous/anxious and does not have  insomnia.     The patient is practicing social distancing.  Past Medical History:  Diagnosis Date  . Arthritis   . Cataract   . Diabetes mellitus   . Diabetes mellitus without complication ([place])    Phreesia 05/15/2020  . Fibroids   . Glaucoma   . Glaucoma    Phreesia 05/15/2020  . Hyperlipidemia   . Hypertension 07 /2013    WITH EF GREATER THAN 55 % WITH MILD MR BUT NO PROLASPE ,ESSENTIALLY NORMAL DONE TO EVALUATE PALPITATIONS .  Marland Kitchen Obesity (BMI 30.0-34.9)   . Osteoporosis   . Palpitations    CONTROLLED ON BETA BLOCKER  . Symptomatic anemia post operative blood loss 05/12/2020   Past Surgical History:  Procedure Laterality Date  . ABDOMINAL HYSTERECTOMY  10/18/ 1982   TAH-BSO for fibroids and adenomatous hyperplasia  . CARDIAC CATHETERIZATION  08/2009   NONOBSTRUCTIVE CAD WITH 20% IN THE LAD AND 20 % IN THE RCA NORMAL EF 65%  . CARDIAC CATHETERIZATION N/A 07/05/2015   Procedure: Left Heart Cath and Coronary Angiography;  Surgeon: Shanon Brow  Loren Racer, MD;  Location: [place] [facility];  Service: Cardiovascular: Angiographically minimal CAD. EF 60-65%  . CPET-MET Test (Cardiopulmonary Exercise Test)  03/06/2013   Adequate effort at 1.11. Peak VO2 12.3 is 86% in the normal range. Heart rate was just outside the normal range target being 127 beats a minute but this was 86% of projected. She did have an ischemic pattern after the anterograde threshold with increased heart rate and blunting of stroke volume during the last 3 minutes of exercise, but was asymptomatic.  Marland Kitchen JOINT REPLACEMENT  09/05/2009   L knee  . KNEE SURGERY  1984   per pt screws in R knee  . NM MYOVIEW LTD  06/2015   FALSE + GXT - Images Accurate:  TM - 6 min, 7 METs (93% MPHR) --> chest discomfort, notable ST depressions => Hight Risk Duke TM Score, but NO scintigraphic evidence of ischemia or infarction. EF 74$  . ORIF FEMUR FRACTURE Right 05/10/2020   Procedure: OPEN REDUCTION INTERNAL FIXATION (ORIF) DISTAL FEMUR  FRACTURE;  Surgeon: Shona Needles, MD;  Location: [facility];  Service: Orthopedics;  Laterality: Right;  . 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 TRANSTHORACIC ECHOCARDIOGRAM  July 2013   EF greater than 55%, mild MR no prolapse.  Normal  . TUBAL LIGATION      Diagnostic Diagram-angiographic normal, extremely tortuous vessels. Consistent with hypertensive heart disease    Current Meds  Medication Sig  . acetaminophen (TYLENOL) 500 MG tablet Take 2 tablets (1,000 mg total) by mouth every 8 (eight) hours.  Marland Kitchen amLODipine (NORVASC) 5 MG tablet TAKE 1 TABLET(5 MG) BY MOUTH DAILY  . aspirin 81 MG tablet Take 81 mg by mouth daily.  Marland Kitchen atorvastatin (LIPITOR) 20 MG tablet TAKE 1 TABLET(20 MG) BY MOUTH DAILY  . Blood Glucose Monitoring Suppl (BLOOD GLUCOSE MONITOR SYSTEM) w/Device KIT 1 Units by Does not apply route 2 (two) times daily.  . brimonidine (ALPHAGAN) 0.2 % ophthalmic solution Place 1 drop into both eyes 2 (two) times daily.  Marland Kitchen Co-Enzyme Q-10 30 MG CAPS Take 30 mg by mouth daily.  . dorzolamide-timolol (COSOPT) 22.3-6.8 MG/ML ophthalmic solution Place 1 drop into both eyes 2 (two) times daily.  Marland Kitchen glipiZIDE (GLUCOTROL) 5 MG tablet TAKE 1 TABLET(5 MG) BY MOUTH TWICE DAILY BEFORE A MEAL (Patient taking differently: Take 5 mg by mouth 2 (two) times daily before a meal.)  . hydrochlorothiazide (HYDRODIURIL) 25 MG tablet TAKE 1 TABLET(25 MG) BY MOUTH DAILY  . HYDROcodone-acetaminophen (NORCO/VICODIN) 5-325 MG tablet Take 1 tablet by mouth every 6 (six) hours as needed for severe pain.  . hydrocortisone (ANUSOL-HC) 2.5 % rectal cream Place 1 application rectally 2 (two) times daily.  . iron polysaccharides (NIFEREX) 150 MG capsule Take 1 capsule (150 mg total) by mouth daily.  Marland Kitchen latanoprost (XALATAN) 0.005 % ophthalmic solution Place 1 drop into both eyes at  bedtime.  Marland Kitchen losartan (COZAAR) 50 MG tablet Take 50 mg by mouth daily.  . metoprolol succinate (TOPROL-XL) 50 MG 24 hr tablet TAKE 1 TABLET BY MOUTH EVERY DAY TAKE IMMEDIATELY FOLLOWING A MEAL (Patient taking differently: Take 50 mg by mouth in the morning and at bedtime.)  . Multiple Vitamin (MULTIVITAMIN) tablet Take 1 tablet by mouth daily.  . ondansetron (ZOFRAN ODT) 4 MG disintegrating tablet Take 1 tablet (4 mg total) by mouth every  8 (eight) hours as needed for nausea or vomiting.     Allergies:   Lisinopril and Metformin and related   Social History   Tobacco Use  . Smoking status: Former Smoker    Packs/day: 0.50    Years: 20.00    Pack years: 10.00    Types: Cigarettes    Quit date: 07/13/1981    Years since quitting: 39.0  . Smokeless tobacco: Never Used  Vaping Use  . Vaping Use: Never used  Substance Use Topics  . Alcohol use: No  . Drug use: No     Family Hx: The patient's family history includes Diabetes in her brother, mother, and sister; Heart disease in her mother; Hyperlipidemia in her brother, mother, sister, and sister; Hypertension in her sister; Prostate cancer in her son.   Labs/Other Tests and Data Reviewed:    EKG:  No ECG reviewed.  Recent Labs: 05/13/2020: Magnesium 2.5 05/18/2020: ALT 28; BUN 25; Creatinine, Ser 1.10; Hemoglobin 11.0; Platelets 319; Potassium 3.6; Sodium 137   Recent Lipid Panel Lab Results  Component Value Date/Time   CHOL 187 05/23/2019 11:50 AM   TRIG 97 05/23/2019 11:50 AM   HDL 76 05/23/2019 11:50 AM   CHOLHDL 2.5 05/23/2019 11:50 AM   CHOLHDL 2.2 03/17/2016 02:41 PM   LDLCALC 94 05/23/2019 11:50 AM    Wt Readings from Last 3 Encounters:  07/11/20 187 lb (84.8 kg)  05/18/20 186 lb 1.1 oz (84.4 kg)  05/15/20 186 lb (84.4 kg)     Objective:    Vital Signs:  BP 117/69   Pulse 92   Wt 187 lb (84.8 kg)   LMP  (LMP Unknown)   BMI 32.10 kg/m   VITAL SIGNS:  reviewed Well nourished, well developed female in no  acute distress. A&O x 3.  normal Mood & Affect Non-labored respirations  ==========================================  COVID-19 Education: The signs and symptoms of COVID-19 were discussed with the patient and how to seek care for testing (follow up with PCP or arrange E-visit).   The importance of social distancing was discussed today.  Time:   Today, I have spent 16 minutes with the patient with telehealth technology discussing the above problems.   An additional 36mnutes spent charting (reviewing prior notes, hospital records, studies, labs etc.) Total 264mutes   Medication Adjustments/Labs and Tests Ordered: Current medicines are reviewed at length with the patient today.  Concerns regarding medicines are outlined above.   Patient Instructions  Medication Instructions:   None  *If you need a refill on your cardiac medications before your next appointment, please call your pharmacy*   Lab Work:   You should be due to get your cholesterol levels checked along with other basic labs.  Probably can be done when you see your primary care physician.  If you have labs (blood work) drawn today and your tests are completely normal, you will receive your results only by: . Marland KitchenyChart Message (if you have MyChart) OR . A paper copy in the mail If you have any lab test that is abnormal or we need to change your treatment, we will call you to review the results.   Testing/Procedures:  None   Follow-Up: [facility] are our [facility].  As part of our continuing mission to provide you with exceptional heart care, we have created designated Provider Care Teams.  These Care Teams include your primary Cardiologist (physician) and Advanced Practice Providers (APPs -  Physician Assistants and  Nurse Practitioners) who all work together to provide you with the care you need, when you need it.  We recommend signing up for the patient portal called "MyChart".  Sign  up information is provided on this After Visit Summary.  MyChart is used to connect with patients for Virtual Visits (Telemedicine).  Patients are able to view lab/test results, encounter notes, upcoming appointments, etc.  Non-urgent messages can be sent to your provider as well.   To learn more about what you can do with MyChart, go to [email or web address].    Your next appointment:   10 month(s) (in November-December 2022)  The format for your next appointment:   In Person  Provider:   Glenetta Hew, MD   Other Instructions Keep staying active -> very happy to see that you are doing well after surgery   HAPPY [birth year] 80[birth year] BIRTHDAY -- !!!!!!!     Signed, Glenetta Hew, MD  07/11/2020 12:46 PM    [place]

## 2020-07-11 NOTE — Assessment & Plan Note (Signed)
EKG was abnormal, echocardiogram did not suggest ischemia.  Cardiac catheterization showed minimal CAD.

## 2020-07-17 ENCOUNTER — Other Ambulatory Visit: Payer: Self-pay | Admitting: Emergency Medicine

## 2020-07-17 ENCOUNTER — Other Ambulatory Visit: Payer: Self-pay | Admitting: Cardiology

## 2020-07-17 DIAGNOSIS — E1159 Type 2 diabetes mellitus with other circulatory complications: Secondary | ICD-10-CM

## 2020-07-17 DIAGNOSIS — E1165 Type 2 diabetes mellitus with hyperglycemia: Secondary | ICD-10-CM

## 2020-07-17 DIAGNOSIS — I119 Hypertensive heart disease without heart failure: Secondary | ICD-10-CM

## 2020-07-17 DIAGNOSIS — I152 Hypertension secondary to endocrine disorders: Secondary | ICD-10-CM

## 2020-07-17 NOTE — Telephone Encounter (Signed)
Pt needs appointment - courtesy refill. Requested Prescriptions  Pending Prescriptions Disp Refills  . hydrochlorothiazide (HYDRODIURIL) 25 MG tablet [Pharmacy Med Name: HYDROCHLOROTHIAZIDE 25MG TABLETS] 30 tablet 0    Sig: TAKE 1 TABLET(25 MG) BY MOUTH DAILY     Cardiovascular: Diuretics - Thiazide Failed - 07/17/2020  6:35 AM      Failed - Cr in normal range and within 360 days    Creat  Date Value Ref Range Status  03/17/2016 0.79 0.60 - 0.93 mg/dL Final    Comment:      For patients > or = 80 years of age: The upper reference limit for Creatinine is approximately 13% higher for people identified as African-American.      Creatinine, Ser  Date Value Ref Range Status  05/18/2020 1.10 (H) 0.44 - 1.00 mg/dL Final         Passed - Ca in normal range and within 360 days    Calcium  Date Value Ref Range Status  05/18/2020 9.7 8.9 - 10.3 mg/dL Final         Passed - K in normal range and within 360 days    Potassium  Date Value Ref Range Status  05/18/2020 3.6 3.5 - 5.1 mmol/L Final         Passed - Na in normal range and within 360 days    Sodium  Date Value Ref Range Status  05/18/2020 137 135 - 145 mmol/L Final  11/21/2019 142 134 - 144 mmol/L Final         Passed - Last BP in normal range    BP Readings from Last 1 Encounters:  07/11/20 117/69         Passed - Valid encounter within last 6 months    Recent Outpatient Visits          2 months ago Intractable vomiting with nausea, unspecified vomiting type   Primary Care at [facility], [place], [place]   7 months ago Hypertension associated with diabetes [facility])   Primary Care at [facility], [place], [place]   11 months ago Hypertension associated with diabetes [facility])   Primary Care at [facility], [place], [place]   1 year ago Hypertension associated with diabetes [facility])   Primary Care at [facility], [place], [place]   1 year ago Type 2 diabetes mellitus with hyperglycemia, without long-term  current use of insulin [facility])   Primary Care at [facility], [place], [place]             . metFORMIN (GLUCOPHAGE) 500 MG tablet [Pharmacy Med Name: METFORMIN 500MG TABLETS] 60 tablet 0    Sig: TAKE 1 TABLET(500 MG) BY MOUTH TWICE DAILY WITH A MEAL     Endocrinology:  Diabetes - Biguanides Failed - 07/17/2020  6:35 AM      Failed - Cr in normal range and within 360 days    Creat  Date Value Ref Range Status  03/17/2016 0.79 0.60 - 0.93 mg/dL Final    Comment:      For patients > or = 80 years of age: The upper reference limit for Creatinine is approximately 13% higher for people identified as African-American.      Creatinine, Ser  Date Value Ref Range Status  05/18/2020 1.10 (H) 0.44 - 1.00 mg/dL Final         Passed - HBA1C is between 0 and 7.9 and within 180 days    Hgb A1c MFr Bld  Date Value  Ref Range Status  05/09/2020 6.4 (H) 4.8 - 5.6 % Final    Comment:    (NOTE) Pre diabetes:          5.7%-6.4%  Diabetes:              >6.4%  Glycemic control for   <7.0% adults with diabetes          Passed - AA eGFR in normal range and within 360 days    GFR, Est African American  Date Value Ref Range Status  04/04/2015 >89 >=60 mL/min Final   GFR calc Af Amer  Date Value Ref Range Status  11/21/2019 70 >59 mL/min/1.73 Final    Comment:    **[facility] currently reports eGFR in compliance with the current**   recommendations of the [employer]. [facility] will   update reporting as new guidelines are published from the NKF-ASN   Task force.    GFR, Est Non African American  Date Value Ref Range Status  04/04/2015 80 >=60 mL/min Final    Comment:      The estimated GFR is a calculation valid for adults (>=7 years old) that uses the CKD-EPI algorithm to adjust for age and sex. It is   not to be used for children, pregnant women, hospitalized patients,    patients on dialysis, or with rapidly changing kidney function. According to the NKDEP, eGFR  >89 is normal, 60-89 shows mild impairment, 30-59 shows moderate impairment, 15-29 shows severe impairment and <15 is ESRD.      GFR, Estimated  Date Value Ref Range Status  05/18/2020 51 (L) >60 mL/min Final    Comment:    (NOTE) Calculated using the CKD-EPI Creatinine Equation (2021)          Passed - Valid encounter within last 6 months    Recent Outpatient Visits          2 months ago Intractable vomiting with nausea, unspecified vomiting type   Primary Care at [facility], [place], [place]   7 months ago Hypertension associated with diabetes [facility])   Primary Care at [place], [place], [place]   11 months ago Hypertension associated with diabetes [facility])   Primary Care at [place], [place], [place]   1 year ago Hypertension associated with diabetes [facility])   Primary Care at [facility], [place], [place]   1 year ago Type 2 diabetes mellitus with hyperglycemia, without long-term current use of insulin [facility])   Primary Care at [facility], [place], [place]             . glipiZIDE (GLUCOTROL) 5 MG tablet [Pharmacy Med Name: GLIPIZIDE 5MG TABLETS] 60 tablet 0    Sig: TAKE 1 TABLET(5 MG) BY MOUTH TWICE DAILY BEFORE A MEAL     Endocrinology:  Diabetes - Sulfonylureas Passed - 07/17/2020  6:35 AM      Passed - HBA1C is between 0 and 7.9 and within 180 days    Hgb A1c MFr Bld  Date Value Ref Range Status  05/09/2020 6.4 (H) 4.8 - 5.6 % Final    Comment:    (NOTE) Pre diabetes:          5.7%-6.4%  Diabetes:              >6.4%  Glycemic control for   <7.0% adults with diabetes          Passed - Valid encounter within last 6 months    Recent Outpatient Visits  2 months ago Intractable vomiting with nausea, unspecified vomiting type   [facility], [place], [place]   7 months ago Hypertension associated with diabetes [facility])   Primary Care at [facility], [place], [place]   11 months ago Hypertension associated with  diabetes [facility])   Primary Care at [facility], [place], [place]   1 year ago Hypertension associated with diabetes [facility])   Primary Care at [facility], [place], [place]   1 year ago Type 2 diabetes mellitus with hyperglycemia, without long-term current use of insulin [facility])   Primary Care at [facility], [place], [place]

## 2020-08-12 ENCOUNTER — Ambulatory Visit: Payer: Medicare Other | Admitting: Emergency Medicine

## 2020-08-12 ENCOUNTER — Encounter: Payer: Self-pay | Admitting: Emergency Medicine

## 2020-08-12 ENCOUNTER — Other Ambulatory Visit: Payer: Self-pay

## 2020-08-12 VITALS — BP 118/73 | HR 74 | Temp 97.0°F | Resp 16 | Ht 65.0 in | Wt 185.0 lb

## 2020-08-12 DIAGNOSIS — I152 Hypertension secondary to endocrine disorders: Secondary | ICD-10-CM | POA: Diagnosis not present

## 2020-08-12 DIAGNOSIS — E1159 Type 2 diabetes mellitus with other circulatory complications: Secondary | ICD-10-CM

## 2020-08-12 LAB — POCT GLYCOSYLATED HEMOGLOBIN (HGB A1C): Hemoglobin A1C: 6.9 % — AB (ref 4.0–5.6)

## 2020-08-12 LAB — GLUCOSE, POCT (MANUAL RESULT ENTRY): POC Glucose: 90 mg/dl (ref 70–99)

## 2020-08-12 NOTE — Progress Notes (Signed)
Angela Chambers 80 y.o.   Chief Complaint  Patient presents with  . Diabetes    Follow up  . Joint Swelling    Per patient right ankle    HISTORY OF PRESENT ILLNESS: This is a 80 y.o. female with history of diabetes and hypertension here for follow-up. Status post right mid femur fracture last December.  Had surgery done without complications. Has noticed mild right ankle swelling on right leg.  No other associated symptoms. Overall doing well.  No other complaints or medical concerns today. Lab Results  Component Value Date   HGBA1C 6.4 (H) 05/09/2020    HPI   Prior to Admission medications   Medication Sig Start Date End Date Taking? Authorizing Provider  acetaminophen (TYLENOL) 500 MG tablet Take 2 tablets (1,000 mg total) by mouth every 8 (eight) hours. 05/14/20  Yes Mercy Riding, MD  amLODipine (NORVASC) 5 MG tablet TAKE 1 TABLET(5 MG) BY MOUTH DAILY 06/10/20  Yes Markeria Goetsch, Ines Bloomer, MD  aspirin 81 MG tablet Take 81 mg by mouth daily.   Yes [provider]  atorvastatin (LIPITOR) 20 MG tablet TAKE 1 TABLET(20 MG) BY MOUTH DAILY 06/10/20  Yes Nanci Lakatos, Ines Bloomer, MD  brimonidine (ALPHAGAN) 0.2 % ophthalmic solution Place 1 drop into both eyes 2 (two) times daily. 05/06/20  Yes [provider]  Co-Enzyme Q-10 30 MG CAPS Take 30 mg by mouth daily.   Yes [provider]  dorzolamide-timolol (COSOPT) 22.3-6.8 MG/ML ophthalmic solution Place 1 drop into both eyes 2 (two) times daily. 09/23/17  Yes [provider]  glipiZIDE (GLUCOTROL) 5 MG tablet TAKE 1 TABLET(5 MG) BY MOUTH TWICE DAILY BEFORE A MEAL 07/17/20  Yes Kalie Cabral, Ines Bloomer, MD  hydrochlorothiazide (HYDRODIURIL) 25 MG tablet TAKE 1 TABLET(25 MG) BY MOUTH DAILY 07/17/20  Yes Leslea Vowles, Ines Bloomer, MD  iron polysaccharides (NIFEREX) 150 MG capsule Take 1 capsule (150 mg total) by mouth daily. 05/14/20  Yes Gonfa, Charlesetta Ivory, MD  latanoprost (XALATAN) 0.005 % ophthalmic solution Place  1 drop into both eyes at bedtime. 09/21/18  Yes [provider]  losartan (COZAAR) 50 MG tablet Take 50 mg by mouth daily.   Yes [provider]  metFORMIN (GLUCOPHAGE) 500 MG tablet TAKE 1 TABLET(500 MG) BY MOUTH TWICE DAILY WITH A MEAL 07/17/20  Yes Lilliane Sposito, Ines Bloomer, MD  metoprolol succinate (TOPROL-XL) 50 MG 24 hr tablet TAKE 1 TABLET BY MOUTH DAILY IMMEDIATELY FOLLOWING A MEAL 07/17/20  Yes Leonie Man, MD  Multiple Vitamin (MULTIVITAMIN) tablet Take 1 tablet by mouth daily.   Yes [provider]  Blood Glucose Monitoring Suppl (BLOOD GLUCOSE MONITOR SYSTEM) w/Device KIT 1 Units by Does not apply route 2 (two) times daily. 02/07/18   Tenna Delaine D, PA-C  HYDROcodone-acetaminophen (NORCO/VICODIN) 5-325 MG tablet Take 1 tablet by mouth every 6 (six) hours as needed for severe pain. 05/13/20   Delray Alt, PA-C  hydrocortisone (ANUSOL-HC) 2.5 % rectal cream Place 1 application rectally 2 (two) times daily. Patient not taking: Reported on 08/12/2020 05/18/20   Isla Pence, MD  ondansetron (ZOFRAN ODT) 4 MG disintegrating tablet Take 1 tablet (4 mg total) by mouth every 8 (eight) hours as needed for nausea or vomiting. 05/15/20   Horald Pollen, MD    Allergies  Allergen Reactions  . Lisinopril Cough  . Metformin And Related     Diarrhea on 1022m BID, tolerates 500 mg BID    Patient Active Problem List   Diagnosis  Date Noted  . Symptomatic anemia post operative blood loss 05/12/2020  . Femur fracture ([place]) 05/09/2020  . Hypertension associated with diabetes ([place]) 08/16/2018  . Abnormal stress echo EKG portion with normal echo -> imaging confirmed the correct with normal coronaries 06/21/2015  . OA (osteoarthritis) of knee 09/25/2013  . Obesity (BMI 30.0-34.9)   . Hypertensive heart disease without CHF 11/30/2011  . Hyperlipidemia 08/25/2011  . Other and combined forms of senile cataract 07/04/2011  . Primary open angle glaucoma of both  eyes, severe stage 07/04/2011  . Dyslipidemia associated with type 2 diabetes mellitus ([place]) 02/22/2009  . PERSONAL HISTORY OF COLONIC POLYPS 02/22/2009    Past Medical History:  Diagnosis Date  . Arthritis   . Cataract   . Diabetes mellitus   . Diabetes mellitus without complication ([place])    Phreesia 05/15/2020  . Fibroids   . Glaucoma   . Glaucoma    Phreesia 05/15/2020  . Hyperlipidemia   . Hypertension 07 /2013    WITH EF GREATER THAN 55 % WITH MILD MR BUT NO PROLASPE ,ESSENTIALLY NORMAL DONE TO EVALUATE PALPITATIONS .  Marland Kitchen Obesity (BMI 30.0-34.9)   . Osteoporosis   . Palpitations    CONTROLLED ON BETA BLOCKER  . Symptomatic anemia post operative blood loss 05/12/2020    Past Surgical History:  Procedure Laterality Date  . ABDOMINAL HYSTERECTOMY  10/18/ 1982   TAH-BSO for fibroids and adenomatous hyperplasia  . CARDIAC CATHETERIZATION  08/2009   NONOBSTRUCTIVE CAD WITH 20% IN THE LAD AND 20 % IN THE RCA NORMAL EF 65%  . CARDIAC CATHETERIZATION N/A 07/05/2015   Procedure: Left Heart Cath and Coronary Angiography;  Surgeon: Leonie Man, MD;  Location: [facility];  Service: Cardiovascular: Angiographically minimal CAD. EF 60-65%  . CPET-MET Test (Cardiopulmonary Exercise Test)  03/06/2013   Adequate effort at 1.11. Peak VO2 12.3 is 86% in the normal range. Heart rate was just outside the normal range target being 127 beats a minute but this was 86% of projected. She did have an ischemic pattern after the anterograde threshold with increased heart rate and blunting of stroke volume during the last 3 minutes of exercise, but was asymptomatic.  Marland Kitchen JOINT REPLACEMENT  09/05/2009   L knee  . KNEE SURGERY  1984   per pt screws in R knee  . NM MYOVIEW LTD  06/2015   FALSE + GXT - Images Accurate:  TM - 6 min, 7 METs (93% MPHR) --> chest discomfort, notable ST depressions => Hight Risk Duke TM Score, but NO scintigraphic evidence of ischemia or infarction. EF 74$  . ORIF FEMUR  FRACTURE Right 05/10/2020   Procedure: OPEN REDUCTION INTERNAL FIXATION (ORIF) DISTAL FEMUR FRACTURE;  Surgeon: Shona Needles, MD;  Location: [facility];  Service: Orthopedics;  Laterality: Right;  . 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 TRANSTHORACIC ECHOCARDIOGRAM  July 2013   EF greater than 55%, mild MR no prolapse.  Normal  . TUBAL LIGATION      Social History   Socioeconomic History  . Marital status: Married    Spouse name: Not on file  . Number of children: 3  . Years of education: Not on file  . Highest education level: Some college, no degree  Occupational History  . Occupation: retired  Tobacco Use  . Smoking status: Former Smoker  Packs/day: 0.50    Years: 20.00    Pack years: 10.00    Types: Cigarettes    Quit date: 07/13/1981    Years since quitting: 39.1  . Smokeless tobacco: Never Used  Vaping Use  . Vaping Use: Never used  Substance and Sexual Activity  . Alcohol use: No  . Drug use: No  . Sexual activity: Yes  Other Topics Concern  . Not on file  Social History Narrative   SHE IS MARRIED ,MOTHER OF 4, GRANDMOTHER OF 6. SHE DOES EXERCISE BUT SOMEWHAT LIMITED BECAUSE OF HER KNEE (S/P SURGERY 2015).   SHE QUIT SMOKING IN 1980 AND SHE DOES NOT DRINK   Social Determinants of Health   Financial Resource Strain: Not on file  Food Insecurity: Not on file  Transportation Needs: Not on file  Physical Activity: Not on file  Stress: Not on file  Social Connections: Not on file  Intimate Partner Violence: Not on file    Family History  Problem Relation Age of Onset  . Diabetes Mother   . Heart disease Mother   . Hyperlipidemia Mother   . Diabetes Sister   . Hyperlipidemia Sister   . Hypertension Sister   . Diabetes Brother   . Hyperlipidemia Brother   . Prostate cancer Son        prostate  . Hyperlipidemia  Sister      Review of Systems  Constitutional: Negative.  Negative for chills and fever.  HENT: Negative.  Negative for congestion and sore throat.   Respiratory: Negative.  Negative for cough and shortness of breath.   Cardiovascular: Negative.  Negative for chest pain and palpitations.  Gastrointestinal: Negative.  Negative for abdominal pain, diarrhea, nausea and vomiting.  Genitourinary: Negative.  Negative for dysuria and hematuria.  Skin: Negative.  Negative for rash.  Neurological: Negative.  Negative for dizziness and headaches.  All other systems reviewed and are negative.   Today's Vitals   08/12/20 1559  BP: 118/73  Pulse: 74  Resp: 16  Temp: (!) 97 F (36.1 C)  TempSrc: Temporal  SpO2: 97%  Weight: 185 lb (83.9 kg)  Height: 5' 5" (1.651 m)   Body mass index is 30.79 kg/m.  Physical Exam Vitals reviewed.  Constitutional:      Appearance: Normal appearance.  HENT:     Head: Normocephalic.  Eyes:     Extraocular Movements: Extraocular movements intact.     Pupils: Pupils are equal, round, and reactive to light.  Cardiovascular:     Rate and Rhythm: Normal rate and regular rhythm.     Pulses: Normal pulses.     Heart sounds: Normal heart sounds.  Pulmonary:     Effort: Pulmonary effort is normal.     Breath sounds: Normal breath sounds.  Musculoskeletal:     Cervical back: Normal range of motion.     Comments: Right lower extremity: Surgical scar well-healed. Mild swelling to lateral right ankle otherwise normal. Left lower extremity: Within normal limits  Skin:    General: Skin is warm and dry.     Capillary Refill: Capillary refill takes less than 2 seconds.  Neurological:     General: No focal deficit present.     Mental Status: She is alert and oriented to person, place, and time.  Psychiatric:        Mood and Affect: Mood normal.        Behavior: Behavior normal.     Results for orders placed or performed in  visit on 08/12/20 (from the past  24 hour(s))  POCT glucose (manual entry)     Status: None   Collection Time: 08/12/20  4:47 PM  Result Value Ref Range   POC Glucose 90 70 - 99 mg/dl  POCT glycosylated hemoglobin (Hb A1C)     Status: Abnormal   Collection Time: 08/12/20  4:52 PM  Result Value Ref Range   Hemoglobin A1C 6.9 (A) 4.0 - 5.6 %   HbA1c POC (<> result, manual entry)     HbA1c, POC (prediabetic range)     HbA1c, POC (controlled diabetic range)      ASSESSMENT & PLAN: Hypertension associated with diabetes ([place]) Well-controlled hypertension.  Continue present medications.  No changes. Well-controlled diabetes with hemoglobin A1c of 6.9.  Continue present medications.  No changes. Follow-up in 6 months.   Clinically stable.  No medical concerns identified during this visit. Continue present medications.  No changes. Follow-up in 6 months. Josalin was seen today for diabetes and joint swelling.  Diagnoses and all orders for this visit:  Hypertension associated with diabetes ([place]) -     POCT glucose (manual entry) -     POCT glycosylated hemoglobin (Hb A1C) -     [id number]    Patient Instructions   Diabetes Mellitus and Nutrition, Adult When you have diabetes, or diabetes mellitus, it is very important to have healthy eating habits because your blood sugar (glucose) levels are greatly affected by what you eat and drink. Eating healthy foods in the right amounts, at about the same times every day, can help you:  Control your blood glucose.  Lower your risk of heart disease.  Improve your blood pressure.  Reach or maintain a healthy weight. What can affect my meal plan? Every person with diabetes is different, and each person has different needs for a meal plan. Your health care provider may recommend that you work with a dietitian to make a meal plan that is best for you. Your meal plan may vary depending on factors such as:  The calories you need.  The medicines you take.  Your  weight.  Your blood glucose, blood pressure, and cholesterol levels.  Your activity level.  Other health conditions you have, such as heart or kidney disease. How do carbohydrates affect me? Carbohydrates, also called carbs, affect your blood glucose level more than any other type of food. Eating carbs naturally raises the amount of glucose in your blood. Carb counting is a method for keeping track of how many carbs you eat. Counting carbs is important to keep your blood glucose at a healthy level, especially if you use insulin or take certain oral diabetes medicines. It is important to know how many carbs you can safely have in each meal. This is different for every person. Your dietitian can help you calculate how many carbs you should have at each meal and for each snack. How does alcohol affect me? Alcohol can cause a sudden decrease in blood glucose (hypoglycemia), especially if you use insulin or take certain oral diabetes medicines. Hypoglycemia can be a life-threatening condition. Symptoms of hypoglycemia, such as sleepiness, dizziness, and confusion, are similar to symptoms of having too much alcohol.  Do not drink alcohol if: ? Your health care provider tells you not to drink. ? You are pregnant, may be pregnant, or are planning to become pregnant.  If you drink alcohol: ? Do not drink on an empty stomach. ? Limit how much you use to:  0-1  drink a day for women.  0-2 drinks a day for men. ? Be aware of how much alcohol is in your drink. In the [place] one drink equals one 12 oz bottle of beer (355 mL), one 5 oz glass of wine (148 mL), or one 1 oz glass of hard liquor (44 mL). ? Keep yourself hydrated with water, diet soda, or unsweetened iced tea.  Keep in mind that regular soda, juice, and other mixers may contain a lot of sugar and must be counted as carbs. What are tips for following this plan? Reading food labels  Start by checking the serving size on the "Nutrition Facts"  label of packaged foods and drinks. The amount of calories, carbs, fats, and other nutrients listed on the label is based on one serving of the item. Many items contain more than one serving per package.  Check the total grams (g) of carbs in one serving. You can calculate the number of servings of carbs in one serving by dividing the total carbs by 15. For example, if a food has 30 g of total carbs per serving, it would be equal to 2 servings of carbs.  Check the number of grams (g) of saturated fats and trans fats in one serving. Choose foods that have a low amount or none of these fats.  Check the number of milligrams (mg) of salt (sodium) in one serving. Most people should limit total sodium intake to less than 2,300 mg per day.  Always check the nutrition information of foods labeled as "low-fat" or "nonfat." These foods may be higher in added sugar or refined carbs and should be avoided.  Talk to your dietitian to identify your daily goals for nutrients listed on the label. Shopping  Avoid buying canned, pre-made, or processed foods. These foods tend to be high in fat, sodium, and added sugar.  Shop around the outside edge of the grocery store. This is where you will most often find fresh fruits and vegetables, bulk grains, fresh meats, and fresh dairy. Cooking  Use low-heat cooking methods, such as baking, instead of high-heat cooking methods like deep frying.  Cook using healthy oils, such as olive, canola, or sunflower oil.  Avoid cooking with butter, cream, or high-fat meats. Meal planning  Eat meals and snacks regularly, preferably at the same times every day. Avoid going long periods of time without eating.  Eat foods that are high in fiber, such as fresh fruits, vegetables, beans, and whole grains. Talk with your dietitian about how many servings of carbs you can eat at each meal.  Eat 4-6 oz (112-168 g) of lean protein each day, such as lean meat, chicken, fish, eggs, or  tofu. One ounce (oz) of lean protein is equal to: ? 1 oz (28 g) of meat, chicken, or fish. ? 1 egg. ?  cup (62 g) of tofu.  Eat some foods each day that contain healthy fats, such as avocado, nuts, seeds, and fish.   What foods should I eat? Fruits Berries. Apples. Oranges. Peaches. Apricots. Plums. Grapes. Mango. Papaya. Pomegranate. Kiwi. Cherries. Vegetables Lettuce. Spinach. Leafy greens, including kale, chard, collard greens, and mustard greens. Beets. Cauliflower. Cabbage. Broccoli. Carrots. Green beans. Tomatoes. Peppers. Onions. Cucumbers. Brussels sprouts. Grains Whole grains, such as whole-wheat or whole-grain bread, crackers, tortillas, cereal, and pasta. Unsweetened oatmeal. Quinoa. Brown or wild rice. Meats and other proteins Seafood. Poultry without skin. Lean cuts of poultry and beef. Tofu. Nuts. Seeds. Dairy Low-fat or fat-free dairy products  such as milk, yogurt, and cheese. The items listed above may not be a complete list of foods and beverages you can eat. Contact a dietitian for more information. What foods should I avoid? Fruits Fruits canned with syrup. Vegetables Canned vegetables. Frozen vegetables with butter or cream sauce. Grains Refined white flour and flour products such as bread, pasta, snack foods, and cereals. Avoid all processed foods. Meats and other proteins Fatty cuts of meat. Poultry with skin. Breaded or fried meats. Processed meat. Avoid saturated fats. Dairy Full-fat yogurt, cheese, or milk. Beverages Sweetened drinks, such as soda or iced tea. The items listed above may not be a complete list of foods and beverages you should avoid. Contact a dietitian for more information. Questions to ask a health care provider  Do I need to meet with a diabetes educator?  Do I need to meet with a dietitian?  What number can I call if I have questions?  When are the best times to check my blood glucose? Where to find more information:  American  Diabetes Association: diabetes.org  Academy of Nutrition and Dietetics: www.eatright.[employer] of Diabetes and Digestive and Kidney Diseases: [employer]  Association of Diabetes Care and Education Specialists: www.diabeteseducator.org Summary  It is important to have healthy eating habits because your blood sugar (glucose) levels are greatly affected by what you eat and drink.  A healthy meal plan will help you control your blood glucose and maintain a healthy lifestyle.  Your health care provider may recommend that you work with a dietitian to make a meal plan that is best for you.  Keep in mind that carbohydrates (carbs) and alcohol have immediate effects on your blood glucose levels. It is important to count carbs and to use alcohol carefully. This information is not intended to replace advice given to you by your health care provider. Make sure you discuss any questions you have with your health care provider. Document Revised: 04/25/2019 Document Reviewed: 04/25/2019 Elsevier Patient Education  2021 Elsevier Inc.      Agustina Caroli, MD [facility]

## 2020-08-12 NOTE — Assessment & Plan Note (Signed)
Well-controlled hypertension.  Continue present medications.  No changes. Well-controlled diabetes with hemoglobin A1c of 6.9.  Continue present medications.  No changes. Follow-up in 6 months.

## 2020-08-12 NOTE — Patient Instructions (Signed)
Diabetes Mellitus and Nutrition, Adult When you have diabetes, or diabetes mellitus, it is very important to have healthy eating habits because your blood sugar (glucose) levels are greatly affected by what you eat and drink. Eating healthy foods in the right amounts, at about the same times every day, can help you:  Control your blood glucose.  Lower your risk of heart disease.  Improve your blood pressure.  Reach or maintain a healthy weight. What can affect my meal plan? Every person with diabetes is different, and each person has different needs for a meal plan. Your health care provider may recommend that you work with a dietitian to make a meal plan that is best for you. Your meal plan may vary depending on factors such as:  The calories you need.  The medicines you take.  Your weight.  Your blood glucose, blood pressure, and cholesterol levels.  Your activity level.  Other health conditions you have, such as heart or kidney disease. How do carbohydrates affect me? Carbohydrates, also called carbs, affect your blood glucose level more than any other type of food. Eating carbs naturally raises the amount of glucose in your blood. Carb counting is a method for keeping track of how many carbs you eat. Counting carbs is important to keep your blood glucose at a healthy level, especially if you use insulin or take certain oral diabetes medicines. It is important to know how many carbs you can safely have in each meal. This is different for every person. Your dietitian can help you calculate how many carbs you should have at each meal and for each snack. How does alcohol affect me? Alcohol can cause a sudden decrease in blood glucose (hypoglycemia), especially if you use insulin or take certain oral diabetes medicines. Hypoglycemia can be a life-threatening condition. Symptoms of hypoglycemia, such as sleepiness, dizziness, and confusion, are similar to symptoms of having too much  alcohol.  Do not drink alcohol if: ? Your health care provider tells you not to drink. ? You are pregnant, may be pregnant, or are planning to become pregnant.  If you drink alcohol: ? Do not drink on an empty stomach. ? Limit how much you use to:  0-1 drink a day for women.  0-2 drinks a day for men. ? Be aware of how much alcohol is in your drink. In the [place] one drink equals one 12 oz bottle of beer (355 mL), one 5 oz glass of wine (148 mL), or one 1 oz glass of hard liquor (44 mL). ? Keep yourself hydrated with water, diet soda, or unsweetened iced tea.  Keep in mind that regular soda, juice, and other mixers may contain a lot of sugar and must be counted as carbs. What are tips for following this plan? Reading food labels  Start by checking the serving size on the "Nutrition Facts" label of packaged foods and drinks. The amount of calories, carbs, fats, and other nutrients listed on the label is based on one serving of the item. Many items contain more than one serving per package.  Check the total grams (g) of carbs in one serving. You can calculate the number of servings of carbs in one serving by dividing the total carbs by 15. For example, if a food has 30 g of total carbs per serving, it would be equal to 2 servings of carbs.  Check the number of grams (g) of saturated fats and trans fats in one serving. Choose foods that have   a low amount or none of these fats.  Check the number of milligrams (mg) of salt (sodium) in one serving. Most people should limit total sodium intake to less than 2,300 mg per day.  Always check the nutrition information of foods labeled as "low-fat" or "nonfat." These foods may be higher in added sugar or refined carbs and should be avoided.  Talk to your dietitian to identify your daily goals for nutrients listed on the label. Shopping  Avoid buying canned, pre-made, or processed foods. These foods tend to be high in fat, sodium, and added  sugar.  Shop around the outside edge of the grocery store. This is where you will most often find fresh fruits and vegetables, bulk grains, fresh meats, and fresh dairy. Cooking  Use low-heat cooking methods, such as baking, instead of high-heat cooking methods like deep frying.  Cook using healthy oils, such as olive, canola, or sunflower oil.  Avoid cooking with butter, cream, or high-fat meats. Meal planning  Eat meals and snacks regularly, preferably at the same times every day. Avoid going long periods of time without eating.  Eat foods that are high in fiber, such as fresh fruits, vegetables, beans, and whole grains. Talk with your dietitian about how many servings of carbs you can eat at each meal.  Eat 4-6 oz (112-168 g) of lean protein each day, such as lean meat, chicken, fish, eggs, or tofu. One ounce (oz) of lean protein is equal to: ? 1 oz (28 g) of meat, chicken, or fish. ? 1 egg. ?  cup (62 g) of tofu.  Eat some foods each day that contain healthy fats, such as avocado, nuts, seeds, and fish.   What foods should I eat? Fruits Berries. Apples. Oranges. Peaches. Apricots. Plums. Grapes. Mango. Papaya. Pomegranate. Kiwi. Cherries. Vegetables Lettuce. Spinach. Leafy greens, including kale, chard, collard greens, and mustard greens. Beets. Cauliflower. Cabbage. Broccoli. Carrots. Green beans. Tomatoes. Peppers. Onions. Cucumbers. Brussels sprouts. Grains Whole grains, such as whole-wheat or whole-grain bread, crackers, tortillas, cereal, and pasta. Unsweetened oatmeal. Quinoa. Brown or wild rice. Meats and other proteins Seafood. Poultry without skin. Lean cuts of poultry and beef. Tofu. Nuts. Seeds. Dairy Low-fat or fat-free dairy products such as milk, yogurt, and cheese. The items listed above may not be a complete list of foods and beverages you can eat. Contact a dietitian for more information. What foods should I avoid? Fruits Fruits canned with  syrup. Vegetables Canned vegetables. Frozen vegetables with butter or cream sauce. Grains Refined white flour and flour products such as bread, pasta, snack foods, and cereals. Avoid all processed foods. Meats and other proteins Fatty cuts of meat. Poultry with skin. Breaded or fried meats. Processed meat. Avoid saturated fats. Dairy Full-fat yogurt, cheese, or milk. Beverages Sweetened drinks, such as soda or iced tea. The items listed above may not be a complete list of foods and beverages you should avoid. Contact a dietitian for more information. Questions to ask a health care provider  Do I need to meet with a diabetes educator?  Do I need to meet with a dietitian?  What number can I call if I have questions?  When are the best times to check my blood glucose? Where to find more information:  American Diabetes Association: diabetes.org  Academy of Nutrition and Dietetics: www.eatright.org  National Institute of Diabetes and Digestive and Kidney Diseases: www.niddk.nih.gov  Association of Diabetes Care and Education Specialists: www.diabeteseducator.org Summary  It is important to have healthy eating   habits because your blood sugar (glucose) levels are greatly affected by what you eat and drink.  A healthy meal plan will help you control your blood glucose and maintain a healthy lifestyle.  Your health care provider may recommend that you work with a dietitian to make a meal plan that is best for you.  Keep in mind that carbohydrates (carbs) and alcohol have immediate effects on your blood glucose levels. It is important to count carbs and to use alcohol carefully. This information is not intended to replace advice given to you by your health care provider. Make sure you discuss any questions you have with your health care provider. Document Revised: 04/25/2019 Document Reviewed: 04/25/2019 Elsevier Patient Education  2021 Elsevier Inc.  

## 2020-08-13 ENCOUNTER — Encounter: Payer: Self-pay | Admitting: Emergency Medicine

## 2020-08-13 LAB — CMP14+EGFR
ALT: 16 IU/L (ref 0–32)
AST: 17 IU/L (ref 0–40)
Albumin/Globulin Ratio: 1.7 (ref 1.2–2.2)
Albumin: 4.3 g/dL (ref 3.7–4.7)
Alkaline Phosphatase: 170 IU/L — ABNORMAL HIGH (ref 44–121)
BUN/Creatinine Ratio: 22 (ref 12–28)
BUN: 19 mg/dL (ref 8–27)
Bilirubin Total: 0.5 mg/dL (ref 0.0–1.2)
CO2: 25 mmol/L (ref 20–29)
Calcium: 10 mg/dL (ref 8.7–10.3)
Chloride: 101 mmol/L (ref 96–106)
Creatinine, Ser: 0.85 mg/dL (ref 0.57–1.00)
Globulin, Total: 2.6 g/dL (ref 1.5–4.5)
Glucose: 81 mg/dL (ref 65–99)
Potassium: 4 mmol/L (ref 3.5–5.2)
Sodium: 141 mmol/L (ref 134–144)
Total Protein: 6.9 g/dL (ref 6.0–8.5)
eGFR: 69 mL/min/{1.73_m2} (ref >59)

## 2020-08-15 ENCOUNTER — Encounter: Payer: Self-pay | Admitting: Emergency Medicine

## 2020-08-22 ENCOUNTER — Ambulatory Visit
Admission: RE | Admit: 2020-08-22 | Discharge: 2020-08-22 | Disposition: A | Discharge status: Home / Self Care | Payer: Medicare Other | Source: Ambulatory Visit | Attending: Family Medicine | Admitting: Family Medicine

## 2020-08-22 ENCOUNTER — Encounter: Payer: Self-pay | Admitting: Family Medicine

## 2020-08-22 ENCOUNTER — Other Ambulatory Visit: Payer: Self-pay

## 2020-08-22 ENCOUNTER — Ambulatory Visit: Payer: Medicare Other | Admitting: Family Medicine

## 2020-08-22 DIAGNOSIS — M25532 Pain in left wrist: Secondary | ICD-10-CM

## 2020-08-22 DIAGNOSIS — R0789 Other chest pain: Secondary | ICD-10-CM | POA: Diagnosis not present
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
## 2020-08-26 ENCOUNTER — Other Ambulatory Visit: Payer: Self-pay | Admitting: Family Medicine

## 2020-09-04 ENCOUNTER — Ambulatory Visit: Payer: Medicare Other | Admitting: Family Medicine

## 2020-09-04 ENCOUNTER — Encounter: Payer: Self-pay | Admitting: Family Medicine

## 2020-09-04 ENCOUNTER — Other Ambulatory Visit: Payer: Self-pay

## 2020-09-04 ENCOUNTER — Ambulatory Visit: Payer: Self-pay

## 2020-09-04 DIAGNOSIS — M25532 Pain in left wrist: Secondary | ICD-10-CM | POA: Diagnosis not present
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
## 2020-09-06 ENCOUNTER — Encounter: Payer: Self-pay | Admitting: Family Medicine

## 2020-09-16 ENCOUNTER — Other Ambulatory Visit: Payer: Self-pay | Admitting: Emergency Medicine

## 2020-09-16 DIAGNOSIS — I10 Essential (primary) hypertension: Secondary | ICD-10-CM

## 2020-10-15 ENCOUNTER — Other Ambulatory Visit: Payer: Self-pay | Admitting: Emergency Medicine

## 2020-10-15 DIAGNOSIS — I119 Hypertensive heart disease without heart failure: Secondary | ICD-10-CM

## 2020-10-15 DIAGNOSIS — I152 Hypertension secondary to endocrine disorders: Secondary | ICD-10-CM

## 2020-10-15 DIAGNOSIS — E1165 Type 2 diabetes mellitus with hyperglycemia: Secondary | ICD-10-CM

## 2020-10-15 DIAGNOSIS — E1159 Type 2 diabetes mellitus with other circulatory complications: Secondary | ICD-10-CM

## 2021-01-09 ENCOUNTER — Other Ambulatory Visit: Payer: Self-pay | Admitting: Cardiology

## 2021-01-09 ENCOUNTER — Other Ambulatory Visit: Payer: Self-pay | Admitting: Emergency Medicine

## 2021-01-09 DIAGNOSIS — I119 Hypertensive heart disease without heart failure: Secondary | ICD-10-CM

## 2021-01-09 DIAGNOSIS — I152 Hypertension secondary to endocrine disorders: Secondary | ICD-10-CM

## 2021-01-09 DIAGNOSIS — E1159 Type 2 diabetes mellitus with other circulatory complications: Secondary | ICD-10-CM

## 2021-01-09 DIAGNOSIS — E1165 Type 2 diabetes mellitus with hyperglycemia: Secondary | ICD-10-CM

## 2021-02-24 ENCOUNTER — Other Ambulatory Visit: Payer: Self-pay

## 2021-02-25 ENCOUNTER — Ambulatory Visit: Payer: Medicare Other | Admitting: Emergency Medicine

## 2021-02-25 ENCOUNTER — Encounter: Payer: Self-pay | Admitting: Emergency Medicine

## 2021-02-25 VITALS — BP 122/78 | HR 83 | Temp 98.5°F | Ht 65.0 in | Wt 186.0 lb

## 2021-02-25 DIAGNOSIS — E785 Hyperlipidemia, unspecified: Secondary | ICD-10-CM | POA: Diagnosis not present

## 2021-02-25 DIAGNOSIS — E1159 Type 2 diabetes mellitus with other circulatory complications: Secondary | ICD-10-CM | POA: Diagnosis not present

## 2021-02-25 DIAGNOSIS — E1169 Type 2 diabetes mellitus with other specified complication: Secondary | ICD-10-CM | POA: Diagnosis not present

## 2021-02-25 DIAGNOSIS — I152 Hypertension secondary to endocrine disorders: Secondary | ICD-10-CM

## 2021-02-25 LAB — POCT GLYCOSYLATED HEMOGLOBIN (HGB A1C): Hemoglobin A1C: 6.2 % — AB (ref 4.0–5.6)

## 2021-02-25 NOTE — Assessment & Plan Note (Signed)
Well-controlled hypertension.  Continue metoprolol succinate 50 mg daily, losartan 50 mg daily, amlodipine 5 mg daily, and hydrochlorothiazide 25 mg daily. Well-controlled diabetes with hemoglobin A1c of 6.2.  Continue metformin 500 mg twice a day and glipizide 5 mg twice a day. Diet and nutrition discussed.

## 2021-02-25 NOTE — Assessment & Plan Note (Signed)
Diet and nutrition discussed.  Continue atorvastatin 20 mg daily. Continue daily aspirin.

## 2021-02-25 NOTE — Patient Instructions (Signed)
Health Maintenance After Age 80 After age 80, you are at a higher risk for certain long-term diseases and infections as well as injuries from falls. Falls are a major cause of broken bones and head injuries in people who are older than age 80. Getting regular preventive care can help to keep you healthy and well. Preventive care includes getting regular testing and making lifestyle changes as recommended by your health care provider. Talk with your health care provider about: Which screenings and tests you should have. A screening is a test that checks for a disease when you have no symptoms. A diet and exercise plan that is right for you. What should I know about screenings and tests to prevent falls? Screening and testing are the best ways to find a health problem early. Early diagnosis and treatment give you the best chance of managing medical conditions that are common after age 80. Certain conditions and lifestyle choices may make you more likely to have a fall. Your health care provider may recommend: Regular vision checks. Poor vision and conditions such as cataracts can make you more likely to have a fall. If you wear glasses, make sure to get your prescription updated if your vision changes. Medicine review. Work with your health care provider to regularly review all of the medicines you are taking, including over-the-counter medicines. Ask your health care provider about any side effects that may make you more likely to have a fall. Tell your health care provider if any medicines that you take make you feel dizzy or sleepy. Osteoporosis screening. Osteoporosis is a condition that causes the bones to get weaker. This can make the bones weak and cause them to break more easily. Blood pressure screening. Blood pressure changes and medicines to control blood pressure can make you feel dizzy. Strength and balance checks. Your health care provider may recommend certain tests to check your strength and  balance while standing, walking, or changing positions. Foot health exam. Foot pain and numbness, as well as not wearing proper footwear, can make you more likely to have a fall. Depression screening. You may be more likely to have a fall if you have a fear of falling, feel emotionally low, or feel unable to do activities that you used to do. Alcohol use screening. Using too much alcohol can affect your balance and may make you more likely to have a fall. What actions can I take to lower my risk of falls? General instructions Talk with your health care provider about your risks for falling. Tell your health care provider if: You fall. Be sure to tell your health care provider about all falls, even ones that seem minor. You feel dizzy, sleepy, or off-balance. Take over-the-counter and prescription medicines only as told by your health care provider. These include any supplements. Eat a healthy diet and maintain a healthy weight. A healthy diet includes low-fat dairy products, low-fat (lean) meats, and fiber from whole grains, beans, and lots of fruits and vegetables. Home safety Remove any tripping hazards, such as rugs, cords, and clutter. Install safety equipment such as grab bars in bathrooms and safety rails on stairs. Keep rooms and walkways well-lit. Activity  Follow a regular exercise program to stay fit. This will help you maintain your balance. Ask your health care provider what types of exercise are appropriate for you. If you need a cane or walker, use it as recommended by your health care provider. Wear supportive shoes that have nonskid soles. Lifestyle Do not   drink alcohol if your health care provider tells you not to drink. If you drink alcohol, limit how much you have: 0-1 drink a day for women. 0-2 drinks a day for men. Be aware of how much alcohol is in your drink. In the [place] one drink equals one typical bottle of beer (12 oz), one-half glass of wine (5 oz), or one shot of  hard liquor (1 oz). Do not use any products that contain nicotine or tobacco, such as cigarettes and e-cigarettes. If you need help quitting, ask your health care provider. Summary Having a healthy lifestyle and getting preventive care can help to protect your health and wellness after age 80. Screening and testing are the best way to find a health problem early and help you avoid having a fall. Early diagnosis and treatment give you the best chance for managing medical conditions that are more common for people who are older than age 80. Falls are a major cause of broken bones and head injuries in people who are older than age 80. Take precautions to prevent a fall at home. Work with your health care provider to learn what changes you can make to improve your health and wellness and to prevent falls. This information is not intended to replace advice given to you by your health care provider. Make sure you discuss any questions you have with your health care provider. Document Revised: 07/26/2020 Document Reviewed: 05/03/2020 Elsevier Patient Education  2022 [employer]  

## 2021-02-25 NOTE — Progress Notes (Signed)
Angela Chambers 80 y.o.   Chief Complaint  Patient presents with   Diabetes    Diabetic f/u    HISTORY OF PRESENT ILLNESS: This is a 80 y.o. female with history of diabetes and hypertension here for follow-up. Doing well.  Has no complaints or medical concerns today.  HPI   Prior to Admission medications   Medication Sig Start Date End Date Taking? Authorizing Provider  acetaminophen (TYLENOL) 500 MG tablet Take 2 tablets (1,000 mg total) by mouth every 8 (eight) hours. 05/14/20  Yes Mercy Riding, MD  amLODipine (NORVASC) 5 MG tablet TAKE 1 TABLET(5 MG) BY MOUTH DAILY 09/16/20  Yes Edenilson Austad, Ines Bloomer, MD  aspirin 81 MG tablet Take 81 mg by mouth daily.   Yes [provider]  atorvastatin (LIPITOR) 20 MG tablet TAKE 1 TABLET(20 MG) BY MOUTH DAILY 06/10/20  Yes Sherisse Fullilove, Ines Bloomer, MD  Blood Glucose Monitoring Suppl (BLOOD GLUCOSE MONITOR SYSTEM) w/Device KIT 1 Units by Does not apply route 2 (two) times daily. 02/07/18  Yes Timmothy Euler, [place] D, PA-C  brimonidine (ALPHAGAN) 0.2 % ophthalmic solution Place 1 drop into both eyes 2 (two) times daily. 05/06/20  Yes [provider]  Co-Enzyme Q-10 30 MG CAPS Take 30 mg by mouth daily.   Yes [provider]  dorzolamide-timolol (COSOPT) 22.3-6.8 MG/ML ophthalmic solution Place 1 drop into both eyes 2 (two) times daily. 09/23/17  Yes [provider]  glipiZIDE (GLUCOTROL) 5 MG tablet TAKE 1 TABLET(5 MG) BY MOUTH TWICE DAILY BEFORE A MEAL 01/09/21  Yes Robb Sibal, Ines Bloomer, MD  hydrochlorothiazide (HYDRODIURIL) 25 MG tablet TAKE 1 TABLET(25 MG) BY MOUTH DAILY 01/09/21  Yes Mathhew Buysse, Ines Bloomer, MD  iron polysaccharides (NIFEREX) 150 MG capsule Take 1 capsule (150 mg total) by mouth daily. 05/14/20  Yes Gonfa, Charlesetta Ivory, MD  latanoprost (XALATAN) 0.005 % ophthalmic solution Place 1 drop into both eyes at bedtime. 09/21/18  Yes [provider]  losartan (COZAAR) 50 MG tablet Take 50 mg by mouth daily.    Yes [provider]  metFORMIN (GLUCOPHAGE) 500 MG tablet TAKE 1 TABLET(500 MG) BY MOUTH TWICE DAILY WITH A MEAL 01/09/21  Yes Leandre Wien, Ines Bloomer, MD  metoprolol succinate (TOPROL-XL) 50 MG 24 hr tablet TAKE 1 TABLET BY MOUTH DAILY IMMEDIATELY FOLLOWING A MEAL 01/09/21  Yes Leonie Man, MD  Multiple Vitamin (MULTIVITAMIN) tablet Take 1 tablet by mouth daily.   Yes [provider]    Allergies  Allergen Reactions   Lisinopril Cough   Metformin And Related     Diarrhea on 1023m BID, tolerates 500 mg BID    Patient Active Problem List   Diagnosis Date Noted   Symptomatic anemia post operative blood loss 05/12/2020   Femur fracture ([place] 05/09/2020   Hypertension associated with diabetes ([place] 08/16/2018   Abnormal stress echo EKG portion with normal echo -> imaging confirmed the correct with normal coronaries 06/21/2015   OA (osteoarthritis) of knee 09/25/2013   Obesity (BMI 30.0-34.9)    Hypertensive heart disease without CHF 11/30/2011   Hyperlipidemia 08/25/2011   Other and combined forms of senile cataract 07/04/2011   Primary open angle glaucoma of both eyes, severe stage 07/04/2011   Dyslipidemia associated with type 2 diabetes mellitus ([place] 02/22/2009   PERSONAL HISTORY OF COLONIC POLYPS 02/22/2009    Past Medical History:  Diagnosis Date   Arthritis    Cataract    Diabetes mellitus    Diabetes mellitus without complication ([place]  Phreesia 05/15/2020   Fibroids    Glaucoma    Glaucoma    [employer]reesia 05/15/2020   Hyperlipidemia    Hypertension 07 /2013    WITH EF GREATER THAN 55 % WITH MILD MR BUT NO PROLASPE ,ESSENTIALLY NORMAL DONE TO EVALUATE PALPITATIONS .   Obesity (BMI 30.0-34.9)    Osteoporosis    Palpitations    CONTROLLED ON BETA BLOCKER   Symptomatic anemia post operative blood loss 05/12/2020    Past Surgical History:  Procedure Laterality Date   ABDOMINAL HYSTERECTOMY  10/18/ 1982   TAH-BSO for fibroids and adenomatous  hyperplasia   CARDIAC CATHETERIZATION  08/2009   NONOBSTRUCTIVE CAD WITH 20% IN THE LAD AND 20 % IN THE RCA NORMAL EF 65%   CARDIAC CATHETERIZATION N/A 07/05/2015   Procedure: Left Heart Cath and Coronary Angiography;  Surgeon: Leonie Man, MD;  Location: [facility];  Service: Cardiovascular: Angiographically minimal CAD. EF 60-65%   CPET-MET Test (Cardiopulmonary Exercise Test)  03/06/2013   Adequate effort at 1.11. Peak VO2 12.3 is 86% in the normal range. Heart rate was just outside the normal range target being 127 beats a minute but this was 86% of projected. She did have an ischemic pattern after the anterograde threshold with increased heart rate and blunting of stroke volume during the last 3 minutes of exercise, but was asymptomatic.   JOINT REPLACEMENT  09/05/2009   L knee   KNEE SURGERY  1984   per pt screws in R knee   NM MYOVIEW LTD  06/2015   FALSE + GXT - Images Accurate:  TM - 6 min, 7 METs (93% MPHR) --> chest discomfort, notable ST depressions => Hight Risk Duke TM Score, but NO scintigraphic evidence of ischemia or infarction. EF 74$   ORIF FEMUR FRACTURE Right 05/10/2020   Procedure: OPEN REDUCTION INTERNAL FIXATION (ORIF) DISTAL FEMUR FRACTURE;  Surgeon: Shona Needles, MD;  Location: [facility];  Service: Orthopedics;  Laterality: Right;   TOTAL KNEE ARTHROPLASTY Right 09/25/2013   Procedure: RIGHT TOTAL KNEE ARTHROPLASTY;  Surgeon: Gearlean Alf, MD;  Location: [facility];  Service: Orthopedics;  Laterality: Right;   TRANSTHORACIC ECHOCARDIOGRAM  July 2013   EF greater than 55%, mild MR no prolapse.  Normal   TUBAL LIGATION      Social History   Socioeconomic History   Marital status: Married    Spouse name: Not on file   Number of children: 3   Years of education: Not on file   Highest education level: Some college, no degree  Occupational History   Occupation: retired  Tobacco Use   Smoking status: Former    Packs/day: 0.50    Years: 20.00    Pack years:  10.00    Types: Cigarettes    Quit date: 07/13/1981    Years since quitting: 39.6   Smokeless tobacco: Never  Vaping Use   Vaping Use: Never used  Substance and Sexual Activity   Alcohol use: No   Drug use: No   Sexual activity: Yes  Other Topics Concern   Not on file  Social History Narrative   SHE IS MARRIED ,MOTHER OF 4, GRANDMOTHER OF 6. SHE DOES EXERCISE BUT SOMEWHAT LIMITED BECAUSE OF HER KNEE (S/P SURGERY 2015).   SHE QUIT SMOKING IN 1980 AND SHE DOES NOT DRINK   Social Determinants of Health   Financial Resource Strain: Not on file  Food Insecurity: Not on file  Transportation Needs: Not on file  Physical Activity: Not on file  Stress: Not on file  Social Connections: Not on file  Intimate Partner Violence: Not on file    Family History  Problem Relation Age of Onset   Diabetes Mother    Heart disease Mother    Hyperlipidemia Mother    Diabetes Sister    Hyperlipidemia Sister    Hypertension Sister    Diabetes Brother    Hyperlipidemia Brother    Prostate cancer Son        prostate   Hyperlipidemia Sister      Review of Systems  Constitutional: Negative.  Negative for chills and fever.  HENT: Negative.  Negative for congestion and sore throat.   Respiratory: Negative.  Negative for cough and shortness of breath.   Cardiovascular: Negative.  Negative for chest pain and palpitations.  Gastrointestinal: Negative.  Negative for abdominal pain, diarrhea, nausea and vomiting.  Genitourinary: Negative.  Negative for dysuria and hematuria.  Skin: Negative.  Negative for rash.  Neurological:  Negative for dizziness and headaches.  All other systems reviewed and are negative.  Vitals:   02/25/21 0953  BP: 122/78  Pulse: 83  Temp: 98.5 F (36.9 C)  SpO2: 92%   Wt Readings from Last 3 Encounters:  02/25/21 186 lb (84.4 kg)  08/22/20 183 lb (83 kg)  08/12/20 185 lb (83.9 kg)    Physical Exam Vitals reviewed.  Constitutional:      Appearance: Normal  appearance.  HENT:     Head: Normocephalic.  Eyes:     Extraocular Movements: Extraocular movements intact.     Conjunctiva/sclera: Conjunctivae normal.     Pupils: Pupils are equal, round, and reactive to light.  Cardiovascular:     Rate and Rhythm: Normal rate and regular rhythm.     Pulses: Normal pulses.     Heart sounds: Normal heart sounds.  Pulmonary:     Effort: Pulmonary effort is normal.     Breath sounds: Normal breath sounds.  Musculoskeletal:     Cervical back: No tenderness.     Right lower leg: No edema.     Left lower leg: No edema.  Lymphadenopathy:     Cervical: No cervical adenopathy.  Skin:    General: Skin is warm and dry.     Capillary Refill: Capillary refill takes less than 2 seconds.  Neurological:     General: No focal deficit present.     Mental Status: She is alert and oriented to person, place, and time.  Psychiatric:        Mood and Affect: Mood normal.        Behavior: Behavior normal.    Results for orders placed or performed in visit on 02/25/21 (from the past 24 hour(s))  POCT glycosylated hemoglobin (Hb A1C)     Status: Abnormal   Collection Time: 02/25/21 10:09 AM  Result Value Ref Range   Hemoglobin A1C 6.2 (A) 4.0 - 5.6 %   HbA1c POC (<> result, manual entry)     HbA1c, POC (prediabetic range)     HbA1c, POC (controlled diabetic range)      ASSESSMENT & PLAN: Hypertension associated with diabetes ([place]) Well-controlled hypertension.  Continue metoprolol succinate 50 mg daily, losartan 50 mg daily, amlodipine 5 mg daily, and hydrochlorothiazide 25 mg daily. Well-controlled diabetes with hemoglobin A1c of 6.2.  Continue metformin 500 mg twice a day and glipizide 5 mg twice a day. Diet and nutrition discussed.  Dyslipidemia associated with type 2 diabetes mellitus ([place])  Diet and nutrition discussed.  Continue atorvastatin 20 mg daily. Continue daily aspirin.  Amal was seen today for diabetes.  Diagnoses and all orders for this  visit:  Hypertension associated with diabetes ([place]) -     POCT glycosylated hemoglobin (Hb A1C)  Dyslipidemia associated with type 2 diabetes mellitus [facility])  Patient Instructions  Health Maintenance After Age 12 After age 71, you are at a higher risk for certain long-term diseases and infections as well as injuries from falls. Falls are a major cause of broken bones and head injuries in people who are older than age 41. Getting regular preventive care can help to keep you healthy and well. Preventive care includes getting regular testing and making lifestyle changes as recommended by your health care provider. Talk with your health care provider about: Which screenings and tests you should have. A screening is a test that checks for a disease when you have no symptoms. A diet and exercise plan that is right for you. What should I know about screenings and tests to prevent falls? Screening and testing are the best ways to find a health problem early. Early diagnosis and treatment give you the best chance of managing medical conditions that are common after age 23. Certain conditions and lifestyle choices may make you more likely to have a fall. Your health care provider may recommend: Regular vision checks. Poor vision and conditions such as cataracts can make you more likely to have a fall. If you wear glasses, make sure to get your prescription updated if your vision changes. Medicine review. Work with your health care provider to regularly review all of the medicines you are taking, including over-the-counter medicines. Ask your health care provider about any side effects that may make you more likely to have a fall. Tell your health care provider if any medicines that you take make you feel dizzy or sleepy. Osteoporosis screening. Osteoporosis is a condition that causes the bones to get weaker. This can make the bones weak and cause them to break more easily. Blood pressure screening. Blood  pressure changes and medicines to control blood pressure can make you feel dizzy. Strength and balance checks. Your health care provider may recommend certain tests to check your strength and balance while standing, walking, or changing positions. Foot health exam. Foot pain and numbness, as well as not wearing proper footwear, can make you more likely to have a fall. Depression screening. You may be more likely to have a fall if you have a fear of falling, feel emotionally low, or feel unable to do activities that you used to do. Alcohol use screening. Using too much alcohol can affect your balance and may make you more likely to have a fall. What actions can I take to lower my risk of falls? General instructions Talk with your health care provider about your risks for falling. Tell your health care provider if: You fall. Be sure to tell your health care provider about all falls, even ones that seem minor. You feel dizzy, sleepy, or off-balance. Take over-the-counter and prescription medicines only as told by your health care provider. These include any supplements. Eat a healthy diet and maintain a healthy weight. A healthy diet includes low-fat dairy products, low-fat (lean) meats, and fiber from whole grains, beans, and lots of fruits and vegetables. Home safety Remove any tripping hazards, such as rugs, cords, and clutter. Install safety equipment such as grab bars in bathrooms and safety rails on stairs. Keep rooms and  walkways well-lit. Activity  Follow a regular exercise program to stay fit. This will help you maintain your balance. Ask your health care provider what types of exercise are appropriate for you. If you need a cane or walker, use it as recommended by your health care provider. Wear supportive shoes that have nonskid soles. Lifestyle Do not drink alcohol if your health care provider tells you not to drink. If you drink alcohol, limit how much you have: 0-1 drink a day for  women. 0-2 drinks a day for men. Be aware of how much alcohol is in your drink. In the [place] one drink equals one typical bottle of beer (12 oz), one-half glass of wine (5 oz), or one shot of hard liquor (1 oz). Do not use any products that contain nicotine or tobacco, such as cigarettes and e-cigarettes. If you need help quitting, ask your health care provider. Summary Having a healthy lifestyle and getting preventive care can help to protect your health and wellness after age 47. Screening and testing are the best way to find a health problem early and help you avoid having a fall. Early diagnosis and treatment give you the best chance for managing medical conditions that are more common for people who are older than age 28. Falls are a major cause of broken bones and head injuries in people who are older than age 5. Take precautions to prevent a fall at home. Work with your health care provider to learn what changes you can make to improve your health and wellness and to prevent falls. This information is not intended to replace advice given to you by your health care provider. Make sure you discuss any questions you have with your health care provider. Document Revised: 07/26/2020 Document Reviewed: 05/03/2020 Elsevier Patient Education  2022 [place], MD [facility]

## 2021-03-12 ENCOUNTER — Other Ambulatory Visit: Payer: Self-pay | Admitting: Emergency Medicine

## 2021-03-12 DIAGNOSIS — I10 Essential (primary) hypertension: Secondary | ICD-10-CM

## 2021-04-04 ENCOUNTER — Other Ambulatory Visit: Payer: Self-pay | Admitting: Emergency Medicine

## 2021-04-04 DIAGNOSIS — E1165 Type 2 diabetes mellitus with hyperglycemia: Secondary | ICD-10-CM

## 2021-04-04 DIAGNOSIS — E1159 Type 2 diabetes mellitus with other circulatory complications: Secondary | ICD-10-CM

## 2021-04-04 DIAGNOSIS — I152 Hypertension secondary to endocrine disorders: Secondary | ICD-10-CM

## 2021-04-04 DIAGNOSIS — I119 Hypertensive heart disease without heart failure: Secondary | ICD-10-CM
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
## 2021-05-27 ENCOUNTER — Other Ambulatory Visit: Payer: Self-pay | Admitting: Emergency Medicine

## 2021-05-27 DIAGNOSIS — E1159 Type 2 diabetes mellitus with other circulatory complications: Secondary | ICD-10-CM

## 2021-06-09 ENCOUNTER — Encounter: Payer: Self-pay | Admitting: Cardiology

## 2021-06-09 ENCOUNTER — Ambulatory Visit: Payer: Medicare Other | Admitting: Cardiology

## 2021-06-09 ENCOUNTER — Other Ambulatory Visit: Payer: Self-pay

## 2021-06-09 VITALS — BP 130/70 | HR 79 | Resp 20 | Ht 65.0 in | Wt 192.0 lb

## 2021-06-09 DIAGNOSIS — E785 Hyperlipidemia, unspecified: Secondary | ICD-10-CM

## 2021-06-09 DIAGNOSIS — R9439 Abnormal result of other cardiovascular function study: Secondary | ICD-10-CM

## 2021-06-09 DIAGNOSIS — E782 Mixed hyperlipidemia: Secondary | ICD-10-CM

## 2021-06-09 DIAGNOSIS — I152 Hypertension secondary to endocrine disorders: Secondary | ICD-10-CM

## 2021-06-09 DIAGNOSIS — I119 Hypertensive heart disease without heart failure: Secondary | ICD-10-CM | POA: Diagnosis not present

## 2021-06-09 DIAGNOSIS — E1169 Type 2 diabetes mellitus with other specified complication: Secondary | ICD-10-CM

## 2021-06-09 DIAGNOSIS — E1159 Type 2 diabetes mellitus with other circulatory complications: Secondary | ICD-10-CM | POA: Diagnosis not present

## 2021-06-09 NOTE — Progress Notes (Signed)
Primary Care Provider: No primary care provider on file. Cardiologist: None Electrophysiologist: None  Clinic Note: Chief Complaint  Patient presents with   Follow-up    Doing well.  No major issues.    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  No PND, orthopnea with trivial edema.      Relevant Orders   EKG 12-Lead (Completed)   Hyperlipidemia (Chronic)    Most recent LDL was 94-has had blood drawn this year..  She has been on a stable, low-dose atorvastatin 10 mg daily.  Given her advanced age, and essentially normal coronaries by cath couple years ago, we do not need to be overly aggressive, could tolerate may be LDL<100.      Hypertension associated with diabetes ([place]) (Chronic)    Pretty well controlled hypertension on combination of moderate dose amlodipine, Toprol, and losartan along with HCTZ.  Continue current meds.  She has room to titrate despite everything up if needed..        Other   Abnormal stress echo EKG portion with normal echo -> imaging confirmed the correct with normal coronaries (Chronic)    Based on history of false positive findings on echocardiogram -> cardiac cath showing no significant CAD.  Would not use stress test (would not be sure if he can complete a study) to evaluate symptoms and the future.      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  Would be nice if she could titrate slightly increasing her dosing, but will defer to PCP after lab reassessment with current plan.       ===================================  HPI:    Angela Chambers is a 81 y.o. female  with a PMH notable for Mild Hypertensive Heart Disease w/o Significant CHF along with palpitations who presents today for Annual follow-up.  I last saw her on July 11, 2020 via telemedicine (during the Center.)  Her in person appointment had been delayed because she actually fell while walking in the parking lot to our office for her appointment suffering a fractured distal femur-knee on the right side requiring ORIF.  When I saw her via telemedicine, she was recovering remarkably well.  Was working with physical therapy with great diligence.  Only trigger extra strength strength Tylenol and PRN Aleve.  Otherwise doing well [place] standpoint with just some mild swelling after her surgery.  Otherwise negative review of symptoms.  Recent Hospitalizations: None  Reviewed  CV studies:    The following studies were reviewed today: (if available, images/films reviewed: From Epic Chart or Care Everywhere) None:  Interval History:   Angela Chambers returns for her in person follow-up visit.  Her fractured leg is healed well.  He is able to go up and down steps without any difficulty.  She recovered from her fracture, she is able to get back into her routine activity without any major issues.  She is able to walk up and down stairs several times throughout the course of the day (she has about 23 steps from 1 level of her house to the other).  Only minimal swelling but nothing significant.  No PND  orthopnea. She denies any significant palpitations.  On stable dose of beta-blocker.  CV Review of Symptoms (Summary) Cardiovascular ROS: no chest pain or dyspnea on exertion positive for - edema negative for - irregular heartbeat, orthopnea, palpitations, paroxysmal nocturnal dyspnea, rapid heart rate, shortness of breath, or lightheadedness, dizziness, weakness or syncope/near syncope TIA/amaurosis fugax, claudication.  REVIEWED OF SYSTEMS   Review of Systems  Constitutional:  Positive for  weight loss (She admittedly gained some weight over the holidays.). Negative for malaise/fatigue.  HENT:  Negative for nosebleeds.   Respiratory:  Negative for cough and shortness of breath.   Cardiovascular:        Per HPI  Gastrointestinal:  Negative for blood in stool and melena.  Genitourinary:  Negative for hematuria.  Musculoskeletal:  Positive for joint pain (Mild). Negative for back pain.  Neurological:  Negative for dizziness, weakness and headaches.  Psychiatric/Behavioral:  Negative for memory loss. The patient is not nervous/anxious and does not have insomnia.    I have reviewed and (if needed) personally updated the patient's problem list, medications, allergies, past medical and surgical history, social and family history.   PAST MEDICAL HISTORY   Past Medical History:  Diagnosis Date   Arthritis    Cataract    Diabetes mellitus    Diabetes mellitus without complication ([place])    Phreesia 05/15/2020   Fibroids    Glaucoma    Glaucoma    Phreesia 05/15/2020   Hyperlipidemia    Hypertension 07 /2013    WITH EF GREATER THAN 55 % WITH MILD MR BUT NO PROLASPE ,ESSENTIALLY NORMAL DONE TO EVALUATE PALPITATIONS .   Obesity (BMI 30.0-34.9)    Osteoporosis    Palpitations    CONTROLLED ON BETA BLOCKER   Symptomatic anemia post operative blood loss 05/12/2020    PAST SURGICAL HISTORY   Past Surgical History:  Procedure Laterality Date   ABDOMINAL HYSTERECTOMY  10/18/ 1982   TAH-BSO for fibroids and adenomatous hyperplasia   CARDIAC CATHETERIZATION  08/2009   NONOBSTRUCTIVE CAD WITH 20% IN THE LAD AND 20 % IN THE RCA NORMAL EF 65%   CARDIAC CATHETERIZATION N/A 07/05/2015   Procedure: Left Heart Cath and Coronary Angiography;  Surgeon: Leonie Man, MD;  Location: [facility];  Service: Cardiovascular: Angiographically minimal CAD. EF 60-65%   CPET-MET Test (Cardiopulmonary Exercise Test)  03/06/2013   Adequate effort at 1.11. Peak VO2 12.3 is 86% in the normal  range. Heart rate was just outside the normal range target being 127 beats a minute but this was 86% of projected. She did have an ischemic pattern after the anterograde threshold with increased heart rate and blunting of stroke volume during the last 3 minutes of exercise, but was asymptomatic.   JOINT REPLACEMENT  09/05/2009   L knee   KNEE SURGERY  1984   per pt screws in R knee   NM MYOVIEW LTD  06/2015   FALSE + GXT - Images Accurate:  TM - 6 min, 7 METs (93% MPHR) --> chest discomfort, notable ST depressions => Hight Risk Duke TM Score, but NO scintigraphic evidence of ischemia or infarction. 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  Normal   TUBAL LIGATION     Diagnostic Diagram - angiographic normal, extremely tortuous vessels. Consistent with hypertensive heart disease       Immunization History  Administered Date(s) Administered   Fluad Quad(high Dose 65+) 02/29/2020   Influenza Split 04/15/2011, 02/17/2012, 03/09/2013   Influenza, High Dose Seasonal PF 01/29/2016, 01/20/2017, 02/07/2018, 01/26/2019   Influenza-Unspecified 02/23/2014, 01/31/2015, 01/20/2017   Moderna Sars-Covid-2 Vaccination 02/14/2021   PFIZER(Purple Top)SARS-COV-2 Vaccination 06/26/2019, 07/17/2019, 03/27/2020   Pneumococcal Conjugate-13 11/27/2013   Pneumococcal Polysaccharide-23 10/27/2006   Tdap 04/19/2006, 04/28/2016   Zoster Recombinat (Shingrix) 10/31/2016, 01/20/2017   Zoster, Live 07/02/2008    MEDICATIONS/ALLERGIES   Current Meds  Medication Sig   acetaminophen  (TYLENOL) 500 MG tablet Take 2 tablets (1,000 mg total) by mouth every 8 (eight) hours.   amLODipine (NORVASC) 5 MG tablet TAKE 1 TABLET(5 MG) BY MOUTH DAILY   aspirin 81 MG tablet Take 81 mg by mouth daily.   atorvastatin (LIPITOR) 20 MG tablet TAKE 1 TABLET(20 MG) BY MOUTH DAILY   Blood Glucose Monitoring Suppl (BLOOD GLUCOSE MONITOR SYSTEM) w/Device KIT 1 Units by Does not apply route 2 (two) times daily.   brimonidine (ALPHAGAN) 0.2 % ophthalmic solution Place 1 drop into both eyes 2 (two) times daily.   Co-Enzyme Q-10 30 MG CAPS Take 30 mg by mouth daily.   dorzolamide-timolol (COSOPT) 22.3-6.8 MG/ML ophthalmic solution Place 1 drop into both eyes 2 (two) times daily.   glipiZIDE (GLUCOTROL) 5 MG tablet TAKE 1 TABLET(5 MG) BY MOUTH TWICE DAILY BEFORE A MEAL   hydrochlorothiazide (HYDRODIURIL) 25 MG tablet TAKE 1 TABLET(25 MG) BY MOUTH DAILY   iron polysaccharides (NIFEREX) 150 MG capsule Take 1 capsule (150 mg total) by mouth daily.   latanoprost (XALATAN) 0.005 % ophthalmic solution Place 1 drop into both eyes at bedtime.   losartan (COZAAR) 50 MG tablet Take 50 mg by mouth daily.   metFORMIN (GLUCOPHAGE) 500 MG tablet TAKE 1 TABLET(500 MG) BY MOUTH TWICE DAILY WITH A MEAL   metoprolol succinate (TOPROL-XL) 50 MG 24 hr tablet TAKE 1 TABLET BY MOUTH DAILY IMMEDIATELY FOLLOWING A MEAL   Multiple Vitamin (MULTIVITAMIN) tablet Take 1 tablet by mouth daily.    Allergies  Allergen Reactions   Lisinopril Cough   Metformin And Related     Diarrhea on 1049m BID, tolerates 500 mg BID    SOCIAL HISTORY/FAMILY HISTORY   Reviewed in Epic:  Pertinent findings:  Social History   Tobacco Use   Smoking status: Former    Packs/day: 0.50    Years: 20.00    Pack years: 10.00    Types: Cigarettes    Quit date: 07/13/1981    Years since quitting: 39.9   Smokeless tobacco: Never  Vaping Use   Vaping Use: Never used  Substance Use Topics   Alcohol use: No   Drug use: No   Social History    Social History Narrative   SHE IS MARRIED ,MOTHER OF 4, GRANDMOTHER OF 6. SHE DOES EXERCISE BUT SOMEWHAT LIMITED BECAUSE OF HER KNEE (S/P SURGERY 2015).   SHE QUIT SMOKING IN 1980 AND SHE DOES NOT DRINK    OBJCTIVE -PE, EKG, labs   Wt Readings from Last 3 Encounters:  06/09/21 192 lb (87.1 kg)  02/25/21 186 lb (84.4 kg)  08/22/20 183 lb (83 kg)    Physical Exam:  BP 130/70 (BP Location: Left Arm, Patient Position: Sitting, Cuff Size: Normal)    Pulse 79    Resp 20    Ht $R'5\' 5"'ln$  (1.651 m)    Wt 192 lb (87.1 kg)    LMP  (LMP Unknown)    SpO2 96%    BMI 31.95 kg/m  Physical Exam Constitutional:      General: She is not in acute distress.    Appearance: Normal appearance. She is obese. She is not ill-appearing.     Comments: Mildly obese;  HENT:     Head: Normocephalic and atraumatic.  Neck:     Vascular: No carotid bruit.  Cardiovascular:     Rate and Rhythm: Normal rate and regular rhythm.     Pulses: Normal pulses.     Heart sounds: Normal heart sounds. No murmur heard.   No friction rub. No gallop.  Pulmonary:     Effort: Pulmonary effort is normal. No respiratory distress.     Breath sounds: Normal breath sounds. No wheezing, rhonchi or rales.  Chest:     Chest wall: No tenderness.  Musculoskeletal:        General: No swelling. Normal range of motion.     Cervical back: Normal range of motion and neck supple.  Skin:    General: Skin is warm and dry.  Neurological:     General: No focal deficit present.     Mental Status: She is alert and oriented to person, place, and time.  Psychiatric:        Mood and Affect: Mood normal.        Behavior: Behavior normal.        Thought Content: Thought content normal.        Judgment: Judgment normal.     Comments: Minimal memory loss     Adult ECG Report  Rate: 79 ;  Rhythm: normal sinus rhythm and nonspecific ST and T wave changes, otherwise normal axis, intervals durations. ;   Narrative Interpretation: Stable  EKG  Recent Labs: Reviewed.  New lab due when she sees her PCP in March.. Lab Results  Component Value Date   CHOL 187 05/23/2019   HDL 76 05/23/2019   LDLCALC 94 05/23/2019   TRIG 97 05/23/2019   CHOLHDL 2.5 05/23/2019   Lab Results  Component Value Date   CREATININE 0.85 08/12/2020   BUN 19 08/12/2020   NA 141 08/12/2020   K 4.0 08/12/2020   CL 101 08/12/2020   CO2 25 08/12/2020   CBC Latest Ref Rng & Units 05/18/2020 05/18/2020 05/14/2020  WBC 4.0 - 10.5 K/uL - 8.3 8.2  Hemoglobin 12.0 - 15.0 g/dL 11.0(L) 11.0(L) 10.3(L)  Hematocrit 36.0 - 46.0 % 32.8(L) 32.9(L) 29.2(L)  Platelets 150 - 400 K/uL - 319 255    Lab Results  Component Value Date   HGBA1C 6.2 (A) 02/25/2021   Lab Results  Component Value Date   TSH 4.940 (H) 08/16/2018    ==================================================  COVID-19 Education: The signs and symptoms of COVID-19 were discussed with the patient and how to seek care for testing (follow up with PCP or arrange E-visit).    I spent a total of 19 minutes with the patient spent in direct patient consultation.  Additional time spent with chart review  / charting (studies, outside notes, etc): 14 min Total Time: 33 min  Current medicines are reviewed at length with the patient today.  (+/- concerns) none  This visit occurred during the SARS-CoV-2 public  health emergency.  Safety protocols were in place, including screening questions prior to the visit, additional usage of staff PPE, and extensive cleaning of exam room while observing appropriate contact time as indicated for disinfecting solutions.  Notice: This dictation was prepared with Dragon dictation along with smart phrase technology. Any transcriptional errors that result from this process are unintentional and may not be corrected upon review.  Studies Ordered:   Orders Placed This Encounter  Procedures   EKG 12-Lead    Patient Instructions / Medication Changes & Studies & Tests  Ordered   Patient Instructions  Medication Instructions:   No changes *If you need a refill on your cardiac medications before your next appointment, please call your pharmacy*   Lab Work: Not needed   Testing/Procedures: Not needed   Follow-Up: At [facility], you and your health needs are our priority.  As part of our continuing mission to provide you with exceptional heart care, we have created designated Provider Care Teams.  These Care Teams include your primary Cardiologist (physician) and Advanced Practice Providers (APPs -  Physician Assistants and Nurse Practitioners) who all work together to provide you with the care you need, when you need it.     Your next appointment:   12 month(s)  The format for your next appointment:   In Person  Provider:   Glenetta Hew, MD      Glenetta Hew, M.D., M.S. Interventional Cardiologist   Pager # [phone] Phone # [phone] [address] [place], [place] [address]   Thank you for choosing Heartcare at [facility]!!

## 2021-06-09 NOTE — Patient Instructions (Addendum)

## 2021-06-16 ENCOUNTER — Encounter: Payer: Self-pay | Admitting: Cardiology

## 2021-06-16 NOTE — Assessment & Plan Note (Signed)
Based on history of false positive findings on echocardiogram -> cardiac cath showing no significant CAD.  Would not use stress test (would not be sure if he can complete a study) to evaluate symptoms and the future.

## 2021-06-16 NOTE — Assessment & Plan Note (Signed)
Pretty well controlled hypertension on combination of moderate dose amlodipine, Toprol, and losartan along with HCTZ.  Continue current meds.  She has room to titrate despite everything up if needed.Marland Kitchen

## 2021-06-16 NOTE — Assessment & Plan Note (Signed)
Most recent LDL was 94-has had blood drawn this year..  She has been on a stable, low-dose atorvastatin 10 mg daily.  Given her advanced age, and essentially normal coronaries by cath couple years ago, we do not need to be overly aggressive, could tolerate may be LDL<100.

## 2021-06-16 NOTE — Assessment & Plan Note (Signed)
Despite having some mild LVH on previous evaluation, not having any signs or symptoms to suggest diastolic heart failure.  No PND, orthopnea with trivial edema.

## 2021-06-16 NOTE — Assessment & Plan Note (Signed)
Continue on rosuvastatin at current dose along she is able to tolerate.  Would be nice if she could titrate slightly increasing her dosing, but will defer to PCP after lab reassessment with current plan.

## 2021-06-28 ENCOUNTER — Other Ambulatory Visit: Payer: Self-pay | Admitting: Cardiology

## 2021-07-03 ENCOUNTER — Other Ambulatory Visit: Payer: Self-pay | Admitting: Emergency Medicine

## 2021-07-03 DIAGNOSIS — I119 Hypertensive heart disease without heart failure: Secondary | ICD-10-CM

## 2021-07-03 DIAGNOSIS — E1165 Type 2 diabetes mellitus with hyperglycemia: Secondary | ICD-10-CM

## 2021-07-03 DIAGNOSIS — I10 Essential (primary) hypertension: Secondary | ICD-10-CM

## 2021-08-25 ENCOUNTER — Ambulatory Visit: Payer: Medicare Other | Admitting: Emergency Medicine

## 2021-08-25 ENCOUNTER — Other Ambulatory Visit: Payer: Self-pay

## 2021-08-25 ENCOUNTER — Encounter: Payer: Self-pay | Admitting: Emergency Medicine

## 2021-08-25 VITALS — BP 116/64 | HR 69 | Temp 98.2°F | Ht 65.0 in

## 2021-08-25 DIAGNOSIS — I152 Hypertension secondary to endocrine disorders: Secondary | ICD-10-CM

## 2021-08-25 DIAGNOSIS — E1169 Type 2 diabetes mellitus with other specified complication: Secondary | ICD-10-CM | POA: Diagnosis not present

## 2021-08-25 DIAGNOSIS — E785 Hyperlipidemia, unspecified: Secondary | ICD-10-CM

## 2021-08-25 DIAGNOSIS — E1159 Type 2 diabetes mellitus with other circulatory complications: Secondary | ICD-10-CM

## 2021-08-25 LAB — CBC WITH DIFFERENTIAL/PLATELET
Basophils Absolute: 0.1 10*3/uL (ref 0.0–0.1)
Basophils Relative: 1.1 % (ref 0.0–3.0)
Eosinophils Absolute: 0.2 10*3/uL (ref 0.0–0.7)
Eosinophils Relative: 3.5 % (ref 0.0–5.0)
HCT: 35.5 % — ABNORMAL LOW (ref 36.0–46.0)
Hemoglobin: 12.2 g/dL (ref 12.0–15.0)
Lymphocytes Relative: 35.5 % (ref 12.0–46.0)
Lymphs Abs: 1.8 10*3/uL (ref 0.7–4.0)
MCHC: 34.2 g/dL (ref 30.0–36.0)
MCV: 87 fl (ref 78.0–100.0)
Monocytes Absolute: 0.5 10*3/uL (ref 0.1–1.0)
Monocytes Relative: 9.2 % (ref 3.0–12.0)
Neutro Abs: 2.6 10*3/uL (ref 1.4–7.7)
Neutrophils Relative %: 50.7 % (ref 43.0–77.0)
Platelets: 297 10*3/uL (ref 150.0–400.0)
RBC: 4.08 Mil/uL (ref 3.87–5.11)
RDW: 14.9 % (ref 11.5–15.5)
WBC: 5.2 10*3/uL (ref 4.0–10.5)

## 2021-08-25 LAB — COMPREHENSIVE METABOLIC PANEL
ALT: 13 U/L (ref 0–35)
AST: 14 U/L (ref 0–37)
Albumin: 4.2 g/dL (ref 3.5–5.2)
Alkaline Phosphatase: 70 U/L (ref 39–117)
BUN: 21 mg/dL (ref 6–23)
CO2: 31 mEq/L (ref 19–32)
Calcium: 9.5 mg/dL (ref 8.4–10.5)
Chloride: 103 mEq/L (ref 96–112)
Creatinine, Ser: 0.95 mg/dL (ref 0.40–1.20)
GFR: 56.35 mL/min — ABNORMAL LOW (ref >60.00)
Glucose, Bld: 145 mg/dL — ABNORMAL HIGH (ref 70–99)
Potassium: 3.8 mEq/L (ref 3.5–5.1)
Sodium: 141 mEq/L (ref 135–145)
Total Bilirubin: 0.6 mg/dL (ref 0.2–1.2)
Total Protein: 6.8 g/dL (ref 6.0–8.3)

## 2021-08-25 LAB — HEMOGLOBIN A1C: Hgb A1c MFr Bld: 7.1 % — ABNORMAL HIGH (ref 4.6–6.5)

## 2021-08-25 LAB — LIPID PANEL
Cholesterol: 241 mg/dL — ABNORMAL HIGH (ref 0–200)
HDL: 67.2 mg/dL (ref >39.00)
LDL Cholesterol: 150 mg/dL — ABNORMAL HIGH (ref 0–99)
NonHDL: 174.18
Total CHOL/HDL Ratio: 4
Triglycerides: 121 mg/dL (ref 0.0–149.0)
VLDL: 24.2 mg/dL (ref 0.0–40.0)

## 2021-08-25 MED ORDER — POLYSACCHARIDE IRON COMPLEX 150 MG PO CAPS: 150.0000 mg | ORAL_CAPSULE | Freq: Every day | ORAL | 1 refills | Status: AC

## 2021-08-25 NOTE — Assessment & Plan Note (Signed)
Stable.  Continue atorvastatin 20 mg daily.  Lipid profile done today. ?

## 2021-08-25 NOTE — Patient Instructions (Signed)
Health Maintenance After Age 81 After age 81, you are at a higher risk for certain long-term diseases and infections as well as injuries from falls. Falls are a major cause of broken bones and head injuries in people who are older than age 81. Getting regular preventive care can help to keep you healthy and well. Preventive care includes getting regular testing and making lifestyle changes as recommended by your health care provider. Talk with your health care provider about: Which screenings and tests you should have. A screening is a test that checks for a disease when you have no symptoms. A diet and exercise plan that is right for you. What should I know about screenings and tests to prevent falls? Screening and testing are the best ways to find a health problem early. Early diagnosis and treatment give you the best chance of managing medical conditions that are common after age 81. Certain conditions and lifestyle choices may make you more likely to have a fall. Your health care provider may recommend: Regular vision checks. Poor vision and conditions such as cataracts can make you more likely to have a fall. If you wear glasses, make sure to get your prescription updated if your vision changes. Medicine review. Work with your health care provider to regularly review all of the medicines you are taking, including over-the-counter medicines. Ask your health care provider about any side effects that may make you more likely to have a fall. Tell your health care provider if any medicines that you take make you feel dizzy or sleepy. Strength and balance checks. Your health care provider may recommend certain tests to check your strength and balance while standing, walking, or changing positions. Foot health exam. Foot pain and numbness, as well as not wearing proper footwear, can make you more likely to have a fall. Screenings, including: Osteoporosis screening. Osteoporosis is a condition that causes  the bones to get weaker and break more easily. Blood pressure screening. Blood pressure changes and medicines to control blood pressure can make you feel dizzy. Depression screening. You may be more likely to have a fall if you have a fear of falling, feel depressed, or feel unable to do activities that you used to do. Alcohol use screening. Using too much alcohol can affect your balance and may make you more likely to have a fall. Follow these instructions at home: Lifestyle Do not drink alcohol if: Your health care provider tells you not to drink. If you drink alcohol: Limit how much you have to: 0-1 drink a day for women. 0-2 drinks a day for men. Know how much alcohol is in your drink. In the [place] one drink equals one 12 oz bottle of beer (355 mL), one 5 oz glass of wine (148 mL), or one 1 oz glass of hard liquor (44 mL). Do not use any products that contain nicotine or tobacco. These products include cigarettes, chewing tobacco, and vaping devices, such as e-cigarettes. If you need help quitting, ask your health care provider. Activity  Follow a regular exercise program to stay fit. This will help you maintain your balance. Ask your health care provider what types of exercise are appropriate for you. If you need a cane or walker, use it as recommended by your health care provider. Wear supportive shoes that have nonskid soles. Safety  Remove any tripping hazards, such as rugs, cords, and clutter. Install safety equipment such as grab bars in bathrooms and safety rails on stairs. Keep rooms and walkways   well-lit. General instructions Talk with your health care provider about your risks for falling. Tell your health care provider if: You fall. Be sure to tell your health care provider about all falls, even ones that seem minor. You feel dizzy, tiredness (fatigue), or off-balance. Take over-the-counter and prescription medicines only as told by your health care provider. These include  supplements. Eat a healthy diet and maintain a healthy weight. A healthy diet includes low-fat dairy products, low-fat (lean) meats, and fiber from whole grains, beans, and lots of fruits and vegetables. Stay current with your vaccines. Schedule regular health, dental, and eye exams. Summary Having a healthy lifestyle and getting preventive care can help to protect your health and wellness after age 81. Screening and testing are the best way to find a health problem early and help you avoid having a fall. Early diagnosis and treatment give you the best chance for managing medical conditions that are more common for people who are older than age 81. Falls are a major cause of broken bones and head injuries in people who are older than age 81. Take precautions to prevent a fall at home. Work with your health care provider to learn what changes you can make to improve your health and wellness and to prevent falls. This information is not intended to replace advice given to you by your health care provider. Make sure you discuss any questions you have with your health care provider. Document Revised: 10/07/2020 Document Reviewed: 10/07/2020 Elsevier Patient Education  2022 [employer]  

## 2021-08-25 NOTE — Assessment & Plan Note (Signed)
Well-controlled hypertension.  Continue metoprolol succinate 50 mg daily, losartan 50 mg, amlodipine 5 mg and hydrochlorothiazide 25 mg daily. ?BP Readings from Last 3 Encounters:  ?08/25/21 116/64  ?06/09/21 130/70  ?02/25/21 122/78  ?Well-controlled diabetes.  Continue metformin 500 mg and glipizide 5 mg twice a day. ?Hemoglobin A1c done today. ?Diet and nutrition discussed. ?Follow-up in 6 months. ? ?

## 2021-08-25 NOTE — Progress Notes (Signed)
Angela Chambers ?81 y.o. ? ? ?Chief Complaint  ?Patient presents with  ? Follow-up  ? Tingling  ?  Big toe ( lt foot)   ? ? ?HISTORY OF PRESENT ILLNESS: ?This is a 81 y.o. female with history of hypertension, dyslipidemia, and diabetes, here for follow-up. ?Doing well.  Occasionally gets tingling on big toe of left foot, maybe once a week. ?No other complaints or medical concerns today. ?Overall doing very well. ?Lab Results  ?Component Value Date  ? HGBA1C 6.2 (A) 02/25/2021  ? ? ? ?HPI ? ? ?Prior to Admission medications   ?Medication Sig Start Date End Date Taking? Authorizing Provider  ?acetaminophen (TYLENOL) 500 MG tablet Take 2 tablets (1,000 mg total) by mouth every 8 (eight) hours. 05/14/20  Yes Mercy Riding, MD  ?amLODipine (NORVASC) 5 MG tablet TAKE 1 TABLET(5 MG) BY MOUTH DAILY 07/03/21  Yes Peni Rupard, Ines Bloomer, MD  ?aspirin 81 MG tablet Take 81 mg by mouth daily.   Yes [provider]  ?atorvastatin (LIPITOR) 20 MG tablet TAKE 1 TABLET(20 MG) BY MOUTH DAILY 06/10/20  Yes Rikita Grabert, Ines Bloomer, MD  ?Blood Glucose Monitoring Suppl (BLOOD GLUCOSE MONITOR SYSTEM) w/Device KIT 1 Units by Does not apply route 2 (two) times daily. 02/07/18  Yes Timmothy Euler, [place] D, PA-C  ?brimonidine (ALPHAGAN) 0.2 % ophthalmic solution Place 1 drop into both eyes 2 (two) times daily. 05/06/20  Yes [provider]  ?Co-Enzyme Q-10 30 MG CAPS Take 30 mg by mouth daily.   Yes [provider]  ?dorzolamide-timolol (COSOPT) 22.3-6.8 MG/ML ophthalmic solution Place 1 drop into both eyes 2 (two) times daily. 09/23/17  Yes [provider]  ?glipiZIDE (GLUCOTROL) 5 MG tablet TAKE 1 TABLET(5 MG) BY MOUTH TWICE DAILY BEFORE A MEAL 05/28/21  Yes Wynema Garoutte, Ines Bloomer, MD  ?hydrochlorothiazide (HYDRODIURIL) 25 MG tablet TAKE 1 TABLET(25 MG) BY MOUTH DAILY 07/03/21  Yes Claborn Janusz, Ines Bloomer, MD  ?latanoprost (XALATAN) 0.005 % ophthalmic solution Place 1 drop into both eyes at bedtime. 09/21/18  Yes  [provider]  ?losartan (COZAAR) 50 MG tablet Take 50 mg by mouth daily.   Yes [provider]  ?metFORMIN (GLUCOPHAGE) 500 MG tablet TAKE 1 TABLET(500 MG) BY MOUTH TWICE DAILY WITH A MEAL 07/03/21  Yes Sharonica Kraszewski, Ines Bloomer, MD  ?metoprolol succinate (TOPROL-XL) 50 MG 24 hr tablet TAKE 1 TABLET BY MOUTH DAILY IMMEDIATELY FOLLOWING A MEAL 06/30/21  Yes Leonie Man, MD  ?Multiple Vitamin (MULTIVITAMIN) tablet Take 1 tablet by mouth daily.   Yes [provider]  ?iron polysaccharides (NIFEREX) 150 MG capsule Take 1 capsule (150 mg total) by mouth daily. 08/25/21   Horald Pollen, MD  ? ? ?Allergies  ?Allergen Reactions  ? Lisinopril Cough  ? Metformin And Related   ?  Diarrhea on 1062m BID, tolerates 500 mg BID  ? ? ?Patient Active Problem List  ? Diagnosis Date Noted  ? Symptomatic anemia post operative blood loss 05/12/2020  ? Femur fracture ([facility] 05/09/2020  ? Hypertension associated with diabetes ([facility] 08/16/2018  ? Abnormal stress echo EKG portion with normal echo -> imaging confirmed the correct with normal coronaries 06/21/2015  ? OA (osteoarthritis) of knee 09/25/2013  ? Obesity (BMI 30.0-34.9)   ? Hypertensive heart disease without CHF 11/30/2011  ? Hyperlipidemia 08/25/2011  ? Other and combined forms of senile cataract 07/04/2011  ? Primary open angle glaucoma of both eyes, severe stage 07/04/2011  ? Dyslipidemia associated with type 2 diabetes mellitus ([facility]  02/22/2009  ? PERSONAL HISTORY OF COLONIC POLYPS 02/22/2009  ? ? ?Past Medical History:  ?Diagnosis Date  ? Arthritis   ? Cataract   ? Diabetes mellitus   ? Diabetes mellitus without complication ([place])   ? Phreesia 05/15/2020  ? Fibroids   ? Glaucoma   ? Glaucoma   ? Phreesia 05/15/2020  ? Hyperlipidemia   ? Hypertension 07 /2013   ? WITH EF GREATER THAN 55 % WITH MILD MR BUT NO PROLASPE ,ESSENTIALLY NORMAL DONE TO EVALUATE PALPITATIONS .  ? Obesity (BMI 30.0-34.9)   ? Osteoporosis   ? Palpitations   ? CONTROLLED  ON BETA BLOCKER  ? Symptomatic anemia post operative blood loss 05/12/2020  ? ? ?Past Surgical History:  ?Procedure Laterality Date  ? ABDOMINAL HYSTERECTOMY  10/18/ 1982  ? TAH-BSO for fibroids and adenomatous hyperplasia  ? CARDIAC CATHETERIZATION  08/2009  ? NONOBSTRUCTIVE CAD WITH 20% IN THE LAD AND 20 % IN THE RCA NORMAL EF 65%  ? CARDIAC CATHETERIZATION N/A 07/05/2015  ? Procedure: Left Heart Cath and Coronary Angiography;  Surgeon: Leonie Man, MD;  Location: [facility];  Service: Cardiovascular: Angiographically minimal CAD. EF 60-65%  ? CPET-MET Test (Cardiopulmonary Exercise Test)  03/06/2013  ? Adequate effort at 1.11. Peak VO2 12.3 is 86% in the normal range. Heart rate was just outside the normal range target being 127 beats a minute but this was 86% of projected. She did have an ischemic pattern after the anterograde threshold with increased heart rate and blunting of stroke volume during the last 3 minutes of exercise, but was asymptomatic.  ? JOINT REPLACEMENT  09/05/2009  ? L knee  ? KNEE SURGERY  1984  ? per pt screws in R knee  ? NM MYOVIEW LTD  06/2015  ? FALSE + GXT - Images Accurate:  TM - 6 min, 7 METs (93% MPHR) --> chest discomfort, notable ST depressions => Hight Risk Duke TM Score, but NO scintigraphic evidence of ischemia or infarction. EF 74$  ? ORIF FEMUR FRACTURE Right 05/10/2020  ? Procedure: OPEN REDUCTION INTERNAL FIXATION (ORIF) DISTAL FEMUR FRACTURE;  Surgeon: Shona Needles, MD;  Location: [facility];  Service: Orthopedics;  Laterality: Right;  ? TOTAL KNEE ARTHROPLASTY Right 09/25/2013  ? Procedure: RIGHT TOTAL KNEE ARTHROPLASTY;  Surgeon: Gearlean Alf, MD;  Location: [facility];  Service: Orthopedics;  Laterality: Right;  ? TRANSTHORACIC ECHOCARDIOGRAM  July 2013  ? EF greater than 55%, mild MR no prolapse.  Normal  ? TUBAL LIGATION    ? ? ?Social History  ? ?Socioeconomic History  ? Marital status: Married  ?  Spouse name: Not on file  ? Number of children: 3  ? Years of  education: Not on file  ? Highest education level: Some college, no degree  ?Occupational History  ? Occupation: retired  ?Tobacco Use  ? Smoking status: Former  ?  Packs/day: 0.50  ?  Years: 20.00  ?  Pack years: 10.00  ?  Types: Cigarettes  ?  Quit date: 07/13/1981  ?  Years since quitting: 40.1  ? Smokeless tobacco: Never  ?Vaping Use  ? Vaping Use: Never used  ?Substance and Sexual Activity  ? Alcohol use: No  ? Drug use: No  ? Sexual activity: Yes  ?Other Topics Concern  ? Not on file  ?Social History Narrative  ? SHE IS MARRIED ,MOTHER OF 4, GRANDMOTHER OF 6. SHE DOES EXERCISE BUT SOMEWHAT LIMITED BECAUSE OF HER KNEE (S/P  SURGERY 2015).  ? SHE QUIT SMOKING IN 1980 AND SHE DOES NOT [place]  ? ?Social Determinants of Health  ? ?Financial Resource Strain: Not on file  ?Food Insecurity: Not on file  ?Transportation Needs: Not on file  ?Physical Activity: Not on file  ?Stress: Not on file  ?Social Connections: Not on file  ?Intimate Partner Violence: Not on file  ? ? ?Family History  ?Problem Relation Age of Onset  ? Diabetes Mother   ? Heart disease Mother   ? Hyperlipidemia Mother   ? Diabetes Sister   ? Hyperlipidemia Sister   ? Hypertension Sister   ? Diabetes Brother   ? Hyperlipidemia Brother   ? Prostate cancer Son   ?     prostate  ? Hyperlipidemia Sister   ? ? ? ?Review of Systems  ?Constitutional: Negative.  Negative for chills and fever.  ?HENT: Negative.  Negative for congestion and sore throat.   ?Respiratory: Negative.  Negative for cough and shortness of breath.   ?Cardiovascular: Negative.  Negative for chest pain and palpitations.  ?Gastrointestinal: Negative.  Negative for abdominal pain, diarrhea, nausea and vomiting.  ?Genitourinary: Negative.  Negative for dysuria and hematuria.  ?Skin: Negative.  Negative for rash.  ?All other systems reviewed and are negative. ? ?Today's Vitals  ? 08/25/21 1002  ?BP: 116/64  ?Pulse: 69  ?Temp: 98.2 ?F (36.8 ?C)  ?TempSrc: Oral  ?SpO2: 95%  ?Height: _0  (1.651  m)  ? ?Body mass index is 31.95 kg/m?. ?Wt Readings from Last 3 Encounters:  ?06/09/21 192 lb (87.1 kg)  ?02/25/21 186 lb (84.4 kg)  ?08/22/20 183 lb (83 kg)  ? ? ?Physical Exam ?Vitals reviewed.  ?[place]

## 2021-10-01 ENCOUNTER — Other Ambulatory Visit: Payer: Self-pay | Admitting: Emergency Medicine

## 2021-10-01 DIAGNOSIS — I152 Hypertension secondary to endocrine disorders: Secondary | ICD-10-CM

## 2021-10-01 DIAGNOSIS — I119 Hypertensive heart disease without heart failure: Secondary | ICD-10-CM

## 2021-10-01 DIAGNOSIS — E1165 Type 2 diabetes mellitus with hyperglycemia: Secondary | ICD-10-CM

## 2021-10-06 ENCOUNTER — Ambulatory Visit: Payer: Medicare Other

## 2021-10-13 ENCOUNTER — Ambulatory Visit (INDEPENDENT_AMBULATORY_CARE_PROVIDER_SITE_OTHER): Payer: Medicare Other

## 2021-10-13 ENCOUNTER — Ambulatory Visit: Payer: Medicare Other

## 2021-10-13 DIAGNOSIS — Z0000 Encounter for general adult medical examination without abnormal findings: Secondary | ICD-10-CM | POA: Diagnosis not present

## 2021-10-13 NOTE — Patient Instructions (Signed)
Ms. Hanks , ?Thank you for taking time to come for your Medicare Wellness Visit. I appreciate your ongoing commitment to your health goals. Please review the following plan we discussed and let me know if I can assist you in the future.  ? ?Screening recommendations/referrals: ?Colonoscopy: no longer required  ?Mammogram: no longer required  ?Bone Density: 03/17/2018 ?Recommended yearly ophthalmology/optometry visit for glaucoma screening and checkup ?Recommended yearly dental visit for hygiene and checkup ? ?Vaccinations: ?Influenza vaccine: completed  ?Pneumococcal vaccine: completed  ?Tdap vaccine: 04/28/2016 ?Shingles vaccine: completed    ? ?Advanced directives: yes  ? ?Conditions/risks identified: none  ? ?Next appointment: none  ? ? ?Preventive Care 13 Years and Older, Female ?Preventive care refers to lifestyle choices and visits with your health care provider that can promote health and wellness. ?What does preventive care include? ?A yearly physical exam. This is also called an annual well check. ?Dental exams once or twice a year. ?Routine eye exams. Ask your health care provider how often you should have your eyes checked. ?Personal lifestyle choices, including: ?Daily care of your teeth and gums. ?Regular physical activity. ?Eating a healthy diet. ?Avoiding tobacco and drug use. ?Limiting alcohol use. ?Practicing safe sex. ?Taking low-dose aspirin every day. ?Taking vitamin and mineral supplements as recommended by your health care provider. ?What happens during an annual well check? ?The services and screenings done by your health care provider during your annual well check will depend on your age, overall health, lifestyle risk factors, and family history of disease. ?Counseling  ?Your health care provider may ask you questions about your: ?Alcohol use. ?Tobacco use. ?Drug use. ?Emotional well-being. ?Home and relationship well-being. ?Sexual activity. ?Eating habits. ?History of falls. ?Memory  and ability to understand (cognition). ?Work and work Statistician. ?Reproductive health. ?Screening  ?You may have the following tests or measurements: ?Height, weight, and BMI. ?Blood pressure. ?Lipid and cholesterol levels. These may be checked every 5 years, or more frequently if you are over 75 years old. ?Skin check. ?Lung cancer screening. You may have this screening every year starting at age 58 if you have a 30-pack-year history of smoking and currently smoke or have quit within the past 15 years. ?Fecal occult blood test (FOBT) of the stool. You may have this test every year starting at age 27. ?Flexible sigmoidoscopy or colonoscopy. You may have a sigmoidoscopy every 5 years or a colonoscopy every 10 years starting at age 6. ?Hepatitis C blood test. ?Hepatitis B blood test. ?Sexually transmitted disease (STD) testing. ?Diabetes screening. This is done by checking your blood sugar (glucose) after you have not eaten for a while (fasting). You may have this done every 1-3 years. ?Bone density scan. This is done to screen for osteoporosis. You may have this done starting at age 49. ?Mammogram. This may be done every 1-2 years. Talk to your health care provider about how often you should have regular mammograms. ?Talk with your health care provider about your test results, treatment options, and if necessary, the need for more tests. ?Vaccines  ?Your health care provider may recommend certain vaccines, such as: ?Influenza vaccine. This is recommended every year. ?Tetanus, diphtheria, and acellular pertussis (Tdap, Td) vaccine. You may need a Td booster every 10 years. ?Zoster vaccine. You may need this after age 36. ?Pneumococcal 13-valent conjugate (PCV13) vaccine. One dose is recommended after age 29. ?Pneumococcal polysaccharide (PPSV23) vaccine. One dose is recommended after age 53. ?Talk to your health care provider about which screenings and vaccines  you need and how often you need them. ?This  information is not intended to replace advice given to you by your health care provider. Make sure you discuss any questions you have with your health care provider. ?Document Released: 06/14/2015 Document Revised: 02/05/2016 Document Reviewed: 03/19/2015 ?Elsevier Interactive Patient Education ? 2017 [place]. ? ?Fall Prevention in the Home ?Falls can cause injuries. They can happen to people of all ages. There are many things you can do to make your home safe and to help prevent falls. ?What can I do on the outside of my home? ?Regularly fix the edges of walkways and driveways and fix any cracks. ?Remove anything that might make you trip as you walk through a door, such as a raised step or threshold. ?Trim any bushes or trees on the path to your home. ?Use bright outdoor lighting. ?Clear any walking paths of anything that might make someone trip, such as rocks or tools. ?Regularly check to see if handrails are loose or broken. Make sure that both sides of any steps have handrails. ?Any raised decks and porches should have guardrails on the edges. ?Have any leaves, snow, or ice cleared regularly. ?Use sand or salt on walking paths during winter. ?Clean up any spills in your garage right away. This includes oil or grease spills. ?What can I do in the bathroom? ?Use night lights. ?Install grab bars by the toilet and in the tub and shower. Do not use towel bars as grab bars. ?Use non-skid mats or decals in the tub or shower. ?If you need to sit down in the shower, use a plastic, non-slip stool. ?Keep the floor dry. Clean up any water that spills on the floor as soon as it happens. ?Remove soap buildup in the tub or shower regularly. ?Attach bath mats securely with double-sided non-slip rug tape. ?Do not have throw rugs and other things on the floor that can make you trip. ?What can I do in the bedroom? ?Use night lights. ?Make sure that you have a light by your bed that is easy to reach. ?Do not use any sheets or  blankets that are too big for your bed. They should not hang down onto the floor. ?Have a firm chair that has side arms. You can use this for support while you get dressed. ?Do not have throw rugs and other things on the floor that can make you trip. ?What can I do in the kitchen? ?Clean up any spills right away. ?Avoid walking on wet floors. ?Keep items that you use a lot in easy-to-reach places. ?If you need to reach something above you, use a strong step stool that has a grab bar. ?Keep electrical cords out of the way. ?Do not use floor polish or wax that makes floors slippery. If you must use wax, use non-skid floor wax. ?Do not have throw rugs and other things on the floor that can make you trip. ?What can I do with my stairs? ?Do not leave any items on the stairs. ?Make sure that there are handrails on both sides of the stairs and use them. Fix handrails that are broken or loose. Make sure that handrails are as long as the stairways. ?Check any carpeting to make sure that it is firmly attached to the stairs. Fix any carpet that is loose or worn. ?Avoid having throw rugs at the top or bottom of the stairs. If you do have throw rugs, attach them to the floor with carpet tape. ?Make sure  that you have a light switch at the top of the stairs and the bottom of the stairs. If you do not have them, ask someone to add them for you. ?What else can I do to help prevent falls? ?Wear shoes that: ?Do not have high heels. ?Have rubber bottoms. ?Are comfortable and fit you well. ?Are closed at the toe. Do not wear sandals. ?If you use a stepladder: ?Make sure that it is fully opened. Do not climb a closed stepladder. ?Make sure that both sides of the stepladder are locked into place. ?Ask someone to hold it for you, if possible. ?Clearly mark and make sure that you can see: ?Any grab bars or handrails. ?First and last steps. ?Where the edge of each step is. ?Use tools that help you move around (mobility aids) if they are  needed. These include: ?Canes. ?Walkers. ?Scooters. ?Crutches. ?Turn on the lights when you go into a dark area. Replace any light bulbs as soon as they burn out. ?Set up your furniture so you have a clear path. Av

## 2021-10-13 NOTE — Progress Notes (Signed)
? ?Subjective:  ? Angela Chambers is a 81 y.o. female who presents for Medicare Annual (Subsequent) preventive examination. ? ? ?I connected with Angela Chambers  today by telephone and verified that I am speaking with the correct person using two identifiers. ?Location patient: home ?Location provider: work ?Persons participating in the virtual visit: patient, provider. ?  ?I discussed the limitations, risks, security and privacy concerns of performing an evaluation and management service by telephone and the availability of in person appointments. I also discussed with the patient that there may be a patient responsible charge related to this service. The patient expressed understanding and verbally consented to this telephonic visit.  ?  ?Interactive audio and video telecommunications were attempted between this provider and patient, however failed, due to patient having technical difficulties OR patient did not have access to video capability.  We continued and completed visit with audio only. ? ?  ?Review of Systems    ? ?Cardiac Risk Factors include: advanced age (>12mn, >>49women);diabetes mellitus ? ?   ?Objective:  ?  ?Today's Vitals  ? ?There is no height or weight on file to calculate BMI. ? ? ?  10/13/2021  ?  2:37 PM 05/18/2020  ?  7:02 AM 05/09/2020  ?  3:14 PM 08/24/2017  ?  2:25 PM 08/09/2017  ? 11:49 AM 03/17/2016  ?  3:26 PM 07/05/2015  ?  7:29 AM  ?Advanced Directives  ?Does Patient Have a Medical Advance Directive? Yes No _0   ?Type of [place] will  [place] will [place] will Living will Living will [place] will  ?Does patient want to make changes to medical advance directive?  No - Patient declined No - Patient declined  No - Patient declined  No - Patient declined  ?Copy of [place] Chart? No - copy requested  No - copy  requested No - copy requested No - copy requested No - copy requested No - copy requested  ?Would patient like information on creating a medical advance directive?  No - Patient declined       ? ? ?Current Medications (verified) ?Outpatient Encounter Medications as of 10/13/2021  ?Medication Sig  ? acetaminophen (TYLENOL) 500 MG tablet Take 2 tablets (1,000 mg total) by mouth every 8 (eight) hours.  ? amLODipine (NORVASC) 5 MG tablet TAKE 1 TABLET(5 MG) BY MOUTH DAILY  ? aspirin 81 MG tablet Take 81 mg by mouth daily.  ? atorvastatin (LIPITOR) 20 MG tablet TAKE 1 TABLET(20 MG) BY MOUTH DAILY  ? Blood Glucose Monitoring Suppl (BLOOD GLUCOSE MONITOR SYSTEM) w/Device KIT 1 Units by Does not apply route 2 (two) times daily.  ? brimonidine (ALPHAGAN) 0.2 % ophthalmic solution Place 1 drop into both eyes 2 (two) times daily.  ? Co-Enzyme Q-10 30 MG CAPS Take 30 mg by mouth daily.  ? dorzolamide-timolol (COSOPT) 22.3-6.8 MG/ML ophthalmic solution Place 1 drop into both eyes 2 (two) times daily.  ? glipiZIDE (GLUCOTROL) 5 MG tablet TAKE 1 TABLET(5 MG) BY MOUTH TWICE DAILY BEFORE A MEAL  ? hydrochlorothiazide (HYDRODIURIL) 25 MG tablet TAKE 1 TABLET(25 MG) BY MOUTH DAILY  ? iron polysaccharides (NIFEREX) 150 MG capsule Take 1 capsule (150 mg total) by mouth daily.  ? latanoprost (XALATAN) 0.005 % ophthalmic solution Place 1 drop into both eyes at bedtime.  ? losartan (COZAAR) 50 MG tablet Take 50 mg by mouth  daily.  ? metFORMIN (GLUCOPHAGE) 500 MG tablet TAKE 1 TABLET(500 MG) BY MOUTH TWICE DAILY WITH A MEAL  ? metoprolol succinate (TOPROL-XL) 50 MG 24 hr tablet TAKE 1 TABLET BY MOUTH DAILY IMMEDIATELY FOLLOWING A MEAL  ? Multiple Vitamin (MULTIVITAMIN) tablet Take 1 tablet by mouth daily.  ? ?No facility-administered encounter medications on file as of 10/13/2021.  ? ? ?Allergies (verified) ?Lisinopril and Metformin and related  ? ?History: ?Past Medical History:  ?Diagnosis Date  ? Arthritis   ? Cataract   ? Diabetes  mellitus   ? Diabetes mellitus without complication ([place])   ? Phreesia 05/15/2020  ? Fibroids   ? Glaucoma   ? Glaucoma   ? Phreesia 05/15/2020  ? Hyperlipidemia   ? Hypertension 07 /2013   ? WITH EF GREATER THAN 55 % WITH MILD MR BUT NO PROLASPE ,ESSENTIALLY NORMAL DONE TO EVALUATE PALPITATIONS .  ? Obesity (BMI 30.0-34.9)   ? Osteoporosis   ? Palpitations   ? CONTROLLED ON BETA BLOCKER  ? Symptomatic anemia post operative blood loss 05/12/2020  ? ?Past Surgical History:  ?Procedure Laterality Date  ? ABDOMINAL HYSTERECTOMY  10/18/ 1982  ? TAH-BSO for fibroids and adenomatous hyperplasia  ? CARDIAC CATHETERIZATION  08/2009  ? NONOBSTRUCTIVE CAD WITH 20% IN THE LAD AND 20 % IN THE RCA NORMAL EF 65%  ? CARDIAC CATHETERIZATION N/A 07/05/2015  ? Procedure: Left Heart Cath and Coronary Angiography;  Surgeon: Leonie Man, MD;  Location: [facility];  Service: Cardiovascular: Angiographically minimal CAD. EF 60-65%  ? CPET-MET Test (Cardiopulmonary Exercise Test)  03/06/2013  ? Adequate effort at 1.11. Peak VO2 12.3 is 86% in the normal range. Heart rate was just outside the normal range target being 127 beats a minute but this was 86% of projected. She did have an ischemic pattern after the anterograde threshold with increased heart rate and blunting of stroke volume during the last 3 minutes of exercise, but was asymptomatic.  ? JOINT REPLACEMENT  09/05/2009  ? L knee  ? KNEE SURGERY  1984  ? per pt screws in R knee  ? NM MYOVIEW LTD  06/2015  ? FALSE + GXT - Images Accurate:  TM - 6 min, 7 METs (93% MPHR) --> chest discomfort, notable ST depressions => Hight Risk Duke TM Score, but NO scintigraphic evidence of ischemia or infarction. EF 74$  ? ORIF FEMUR FRACTURE Right 05/10/2020  ? Procedure: OPEN REDUCTION INTERNAL FIXATION (ORIF) DISTAL FEMUR FRACTURE;  Surgeon: Shona Needles, MD;  Location: [facility];  Service: Orthopedics;  Laterality: Right;  ? TOTAL KNEE ARTHROPLASTY Right 09/25/2013  ? Procedure: RIGHT  TOTAL KNEE ARTHROPLASTY;  Surgeon: Gearlean Alf, MD;  Location: [facility];  Service: Orthopedics;  Laterality: Right;  ? TRANSTHORACIC ECHOCARDIOGRAM  July 2013  ? EF greater than 55%, mild MR no prolapse.  Normal  ? TUBAL LIGATION    ? ?Family History  ?Problem Relation Age of Onset  ? Diabetes Mother   ? Heart disease Mother   ? Hyperlipidemia Mother   ? Diabetes Sister   ? Hyperlipidemia Sister   ? Hypertension Sister   ? Diabetes Brother   ? Hyperlipidemia Brother   ? Prostate cancer Son   ?     prostate  ? Hyperlipidemia Sister   ? ?Social History  ? ?Socioeconomic History  ? Marital status: Married  ?  Spouse name: Not on file  ? Number of children: 3  ? Years of education:  Not on file  ? Highest education level: Some college, no degree  ?Occupational History  ? Occupation: retired  ?Tobacco Use  ? Smoking status: Former  ?  Packs/day: 0.50  ?  Years: 20.00  ?  Pack years: 10.00  ?  Types: Cigarettes  ?  Quit date: 07/13/1981  ?  Years since quitting: 40.2  ? Smokeless tobacco: Never  ?Vaping Use  ? Vaping Use: Never used  ?Substance and Sexual Activity  ? Alcohol use: No  ? Drug use: No  ? Sexual activity: Yes  ?Other Topics Concern  ? Not on file  ?Social History Narrative  ? SHE IS MARRIED ,MOTHER OF 4, GRANDMOTHER OF 6. SHE DOES EXERCISE BUT SOMEWHAT LIMITED BECAUSE OF HER KNEE (S/P SURGERY 2015).  ? SHE QUIT SMOKING IN 1980 AND SHE DOES NOT [place]  ? ?Social Determinants of Health  ? ?Financial Resource Strain: Low Risk   ? Difficulty of Paying Living Expenses: Not hard at all  ?Food Insecurity: No Food Insecurity  ? Worried About Charity fundraiser in the Last Year: Never true  ? Ran Out of Food in the Last Year: Never true  ?Transportation Needs: No Transportation Needs  ? Lack of Transportation (Medical): No  ? Lack of Transportation (Non-Medical): No  ?Physical Activity: Insufficiently Active  ? Days of Exercise per Week: 3 days  ? Minutes of Exercise per Session: 30 min  ?Stress: No Stress Concern  Present  ? Feeling of Stress : Not at all  ?Social Connections: Socially Integrated  ? Frequency of Communication with Friends and Family: Three times a week  ? Frequency of Social Gatherings with Friends and Family: Terie Purser

## 2021-10-31 ENCOUNTER — Encounter: Payer: Self-pay | Admitting: Gastroenterology

## 2021-11-10 ENCOUNTER — Telehealth: Payer: Self-pay | Admitting: Emergency Medicine

## 2021-11-10 NOTE — Telephone Encounter (Signed)
Home Health called after doing a [employer] PAD screening and wanted [place] to know the results.  L foot 0.72 R foot 0.83

## 2021-12-30 ENCOUNTER — Other Ambulatory Visit: Payer: Self-pay | Admitting: Emergency Medicine

## 2021-12-30 DIAGNOSIS — I119 Hypertensive heart disease without heart failure: Secondary | ICD-10-CM

## 2021-12-30 DIAGNOSIS — E1165 Type 2 diabetes mellitus with hyperglycemia: Secondary | ICD-10-CM

## 2021-12-30 DIAGNOSIS — I152 Hypertension secondary to endocrine disorders: Secondary | ICD-10-CM

## 2021-12-30 DIAGNOSIS — I10 Essential (primary) hypertension: Secondary | ICD-10-CM

## 2022-02-26 ENCOUNTER — Ambulatory Visit: Payer: Medicare Other | Admitting: Emergency Medicine

## 2022-02-26 ENCOUNTER — Encounter: Payer: Self-pay | Admitting: Emergency Medicine

## 2022-02-26 VITALS — BP 120/64 | HR 74 | Temp 97.9°F | Ht 65.0 in | Wt 192.5 lb

## 2022-02-26 DIAGNOSIS — L989 Disorder of the skin and subcutaneous tissue, unspecified: Secondary | ICD-10-CM | POA: Insufficient documentation

## 2022-02-26 DIAGNOSIS — I152 Hypertension secondary to endocrine disorders: Secondary | ICD-10-CM | POA: Diagnosis not present

## 2022-02-26 DIAGNOSIS — E1159 Type 2 diabetes mellitus with other circulatory complications: Secondary | ICD-10-CM | POA: Diagnosis not present

## 2022-02-26 DIAGNOSIS — E1169 Type 2 diabetes mellitus with other specified complication: Secondary | ICD-10-CM

## 2022-02-26 DIAGNOSIS — E785 Hyperlipidemia, unspecified: Secondary | ICD-10-CM

## 2022-02-26 LAB — POCT GLYCOSYLATED HEMOGLOBIN (HGB A1C): Hemoglobin A1C: 6.5 % — AB (ref 4.0–5.6)

## 2022-02-26 NOTE — Progress Notes (Signed)
Angela Chambers 81 y.o.   Chief Complaint  Patient presents with   Follow-up    F/u appt DM    knot on back     HISTORY OF PRESENT ILLNESS: This is a 81 y.o. female here for follow-up of diabetes and hypertension. Also complaining of skin lesion on her upper back. No other complaints or medical concerns today.  HPI   Prior to Admission medications   Medication Sig Start Date End Date Taking? Authorizing Provider  acetaminophen (TYLENOL) 500 MG tablet Take 2 tablets (1,000 mg total) by mouth every 8 (eight) hours. 05/14/20  Yes Mercy Riding, MD  amLODipine (NORVASC) 5 MG tablet TAKE 1 TABLET(5 MG) BY MOUTH DAILY 12/30/21  Yes Perrin Gens, Ines Bloomer, MD  aspirin 81 MG tablet Take 81 mg by mouth daily.   Yes [provider]  atorvastatin (LIPITOR) 20 MG tablet TAKE 1 TABLET(20 MG) BY MOUTH DAILY 06/10/20  Yes Kamaiya Antilla, Ines Bloomer, MD  Blood Glucose Monitoring Suppl (BLOOD GLUCOSE MONITOR SYSTEM) w/Device KIT 1 Units by Does not apply route 2 (two) times daily. 02/07/18  Yes Timmothy Euler, [place] D, PA-C  brimonidine (ALPHAGAN) 0.2 % ophthalmic solution Place 1 drop into both eyes 2 (two) times daily. 05/06/20  Yes [provider]  Co-Enzyme Q-10 30 MG CAPS Take 30 mg by mouth daily.   Yes [provider]  dorzolamide-timolol (COSOPT) 22.3-6.8 MG/ML ophthalmic solution Place 1 drop into both eyes 2 (two) times daily. 09/23/17  Yes [provider]  glipiZIDE (GLUCOTROL) 5 MG tablet TAKE 1 TABLET(5 MG) BY MOUTH TWICE DAILY BEFORE A MEAL 12/30/21  Yes Raylie Maddison, Ines Bloomer, MD  hydrochlorothiazide (HYDRODIURIL) 25 MG tablet TAKE 1 TABLET(25 MG) BY MOUTH DAILY 12/30/21  Yes Danuta Huseman, Ines Bloomer, MD  iron polysaccharides (NIFEREX) 150 MG capsule Take 1 capsule (150 mg total) by mouth daily. 08/25/21  Yes Berea Majkowski, Ines Bloomer, MD  latanoprost (XALATAN) 0.005 % ophthalmic solution Place 1 drop into both eyes at bedtime. 09/21/18  Yes [provider]  losartan  (COZAAR) 50 MG tablet Take 50 mg by mouth daily.   Yes [provider]  metFORMIN (GLUCOPHAGE) 500 MG tablet TAKE 1 TABLET(500 MG) BY MOUTH TWICE DAILY WITH A MEAL 12/30/21  Yes Anthonie Lotito, Ines Bloomer, MD  metoprolol succinate (TOPROL-XL) 50 MG 24 hr tablet TAKE 1 TABLET BY MOUTH DAILY IMMEDIATELY FOLLOWING A MEAL 06/30/21  Yes Leonie Man, MD  Multiple Vitamin (MULTIVITAMIN) tablet Take 1 tablet by mouth daily.   Yes [provider]    Allergies  Allergen Reactions   Lisinopril Cough   Metformin And Related     Diarrhea on 1085m BID, tolerates 500 mg BID    Patient Active Problem List   Diagnosis Date Noted   Femur fracture ([place] 05/09/2020   Hypertension associated with diabetes ([facility] 08/16/2018   Abnormal stress echo EKG portion with normal echo -> imaging confirmed the correct with normal coronaries 06/21/2015   OA (osteoarthritis) of knee 09/25/2013   Obesity (BMI 30.0-34.9)    Hypertensive heart disease without CHF 11/30/2011   Hyperlipidemia 08/25/2011   Other and combined forms of senile cataract 07/04/2011   Primary open angle glaucoma of both eyes, severe stage 07/04/2011   Dyslipidemia associated with type 2 diabetes mellitus ([facility] 02/22/2009   PERSONAL HISTORY OF COLONIC POLYPS 02/22/2009    Past Medical History:  Diagnosis Date   Arthritis    Cataract    Diabetes mellitus    Diabetes mellitus without complication (  [place])    Phreesia 05/15/2020   Fibroids    Glaucoma    Glaucoma    Phreesia 05/15/2020   Hyperlipidemia    Hypertension 07 /2013    WITH EF GREATER THAN 55 % WITH MILD MR BUT NO PROLASPE ,ESSENTIALLY NORMAL DONE TO EVALUATE PALPITATIONS .   Obesity (BMI 30.0-34.9)    Osteoporosis    Palpitations    CONTROLLED ON BETA BLOCKER   Symptomatic anemia post operative blood loss 05/12/2020    Past Surgical History:  Procedure Laterality Date   ABDOMINAL HYSTERECTOMY  10/18/ 1982   TAH-BSO for fibroids and adenomatous hyperplasia    CARDIAC CATHETERIZATION  08/2009   NONOBSTRUCTIVE CAD WITH 20% IN THE LAD AND 20 % IN THE RCA NORMAL EF 65%   CARDIAC CATHETERIZATION N/A 07/05/2015   Procedure: Left Heart Cath and Coronary Angiography;  Surgeon: Leonie Man, MD;  Location: [facility];  Service: Cardiovascular: Angiographically minimal CAD. EF 60-65%   CPET-MET Test (Cardiopulmonary Exercise Test)  03/06/2013   Adequate effort at 1.11. Peak VO2 12.3 is 86% in the normal range. Heart rate was just outside the normal range target being 127 beats a minute but this was 86% of projected. She did have an ischemic pattern after the anterograde threshold with increased heart rate and blunting of stroke volume during the last 3 minutes of exercise, but was asymptomatic.   JOINT REPLACEMENT  09/05/2009   L knee   KNEE SURGERY  1984   per pt screws in R knee   NM MYOVIEW LTD  06/2015   FALSE + GXT - Images Accurate:  TM - 6 min, 7 METs (93% MPHR) --> chest discomfort, notable ST depressions => Hight Risk Duke TM Score, but NO scintigraphic evidence of ischemia or infarction. EF 74$   ORIF FEMUR FRACTURE Right 05/10/2020   Procedure: OPEN REDUCTION INTERNAL FIXATION (ORIF) DISTAL FEMUR FRACTURE;  Surgeon: Shona Needles, MD;  Location: [facility];  Service: Orthopedics;  Laterality: Right;   TOTAL KNEE ARTHROPLASTY Right 09/25/2013   Procedure: RIGHT TOTAL KNEE ARTHROPLASTY;  Surgeon: Gearlean Alf, MD;  Location: [facility];  Service: Orthopedics;  Laterality: Right;   TRANSTHORACIC ECHOCARDIOGRAM  July 2013   EF greater than 55%, mild MR no prolapse.  Normal   TUBAL LIGATION      Social History   Socioeconomic History   Marital status: Married    Spouse name: Not on file   Number of children: 3   Years of education: Not on file   Highest education level: Some college, no degree  Occupational History   Occupation: retired  Tobacco Use   Smoking status: Former    Packs/day: 0.50    Years: 20.00    Total pack years: 10.00     Types: Cigarettes    Quit date: 07/13/1981    Years since quitting: 40.6   Smokeless tobacco: Never  Vaping Use   Vaping Use: Never used  Substance and Sexual Activity   Alcohol use: No   Drug use: No   Sexual activity: Yes  Other Topics Concern   Not on file  Social History Narrative   SHE IS MARRIED ,MOTHER OF 4, GRANDMOTHER OF 6. SHE DOES EXERCISE BUT SOMEWHAT LIMITED BECAUSE OF HER KNEE (S/P SURGERY 2015).   SHE QUIT SMOKING IN 1980 AND SHE DOES NOT DRINK   Social Determinants of Health   Financial Resource Strain: Low Risk  (10/13/2021)   Overall Financial Resource Strain (CARDIA)  Difficulty of Paying Living Expenses: Not hard at all  Food Insecurity: No Food Insecurity (10/13/2021)   Hunger Vital Sign    Worried About Running Out of Food in the Last Year: Never true    Ran Out of Food in the Last Year: Never true  Transportation Needs: No Transportation Needs (10/13/2021)   PRAPARE - Hydrologist (Medical): No    Lack of Transportation (Non-Medical): No  Physical Activity: Insufficiently Active (10/13/2021)   Exercise Vital Sign    Days of Exercise per Week: 3 days    Minutes of Exercise per Session: 30 min  Stress: No Stress Concern Present (10/13/2021)   [place]    Feeling of Stress : Not at all  Social Connections: [place] (10/13/2021)   Social Connection and Isolation Panel [NHANES]    Frequency of Communication with Friends and Family: Three times a week    Frequency of Social Gatherings with Friends and Family: Three times a week    Attends Religious Services: More than 4 times per year    Active Member of Clubs or Organizations: Yes    Attends Archivist Meetings: More than 4 times per year    Marital Status: Married  Human resources officer Violence: Not At Risk (10/13/2021)   Humiliation, Afraid, Rape, and Kick questionnaire    Fear of Current or  Ex-Partner: No    Emotionally Abused: No    Physically Abused: No    Sexually Abused: No    Family History  Problem Relation Age of Onset   Diabetes Mother    Heart disease Mother    Hyperlipidemia Mother    Diabetes Sister    Hyperlipidemia Sister    Hypertension Sister    Diabetes Brother    Hyperlipidemia Brother    Prostate cancer Son        prostate   Hyperlipidemia Sister      Review of Systems  Constitutional: Negative.  Negative for chills and fever.  HENT: Negative.  Negative for congestion and sore throat.   Respiratory: Negative.  Negative for cough and shortness of breath.   Cardiovascular: Negative.  Negative for chest pain and palpitations.  Gastrointestinal:  Negative for abdominal pain, nausea and vomiting.  Skin: Negative.        Skin lesions  Neurological:  Negative for dizziness and headaches.  All other systems reviewed and are negative.  Today's Vitals   02/26/22 1043  BP: 120/64  Pulse: 74  Temp: 97.9 F (36.6 C)  TempSrc: Oral  SpO2: 93%  Weight: 192 lb 8 oz (87.3 kg)  Height: _0  (1.651 m)   Body mass index is 32.03 kg/m.   Physical Exam Vitals reviewed.  Constitutional:      Appearance: Normal appearance.  HENT:     Head: Normocephalic.     Mouth/Throat:     Mouth: Mucous membranes are moist.     Pharynx: Oropharynx is clear.  Eyes:     Extraocular Movements: Extraocular movements intact.     Conjunctiva/sclera: Conjunctivae normal.     Pupils: Pupils are equal, round, and reactive to light.  Cardiovascular:     Rate and Rhythm: Normal rate and regular rhythm.     Pulses: Normal pulses.     Heart sounds: Normal heart sounds.  Pulmonary:     Effort: Pulmonary effort is normal.     Breath sounds: Normal breath sounds.  Musculoskeletal:  Cervical back: No tenderness.  Lymphadenopathy:     Cervical: No cervical adenopathy.  Skin:    General: Skin is warm and dry.     Findings: Lesion present.  Neurological:      General: No focal deficit present.     Mental Status: She is alert and oriented to person, place, and time.  Psychiatric:        Mood and Affect: Mood normal.        Behavior: Behavior normal.       ASSESSMENT & PLAN: A total of 44 minutes was spent with the patient and counseling/coordination of care regarding preparing for this visit, review of most recent office visit notes, review of most recent blood work results including interpretation of today's hemoglobin A1c, review of multiple chronic medical problems and their management, review of all medications, education on nutrition, cardiovascular risks associated with diabetes and hypertension, prognosis, documentation, and follow-up  Problem List Items Addressed This Visit       Cardiovascular and Mediastinum   Hypertension associated with diabetes ([place]) - Primary (Chronic)    Well-controlled hypertension. Continue metoprolol succinate 50 mg daily, losartan 50 mg daily, hydrochlorothiazide 25 mg and amlodipine 5 mg daily BP Readings from Last 3 Encounters:  02/26/22 120/64  08/25/21 116/64  06/09/21 130/70  Well-controlled diabetes with hemoglobin A1c of 6.5. Continue metformin 500 mg twice a day along with glipizide 5 mg twice a day.  No hypoglycemic events. Cardiovascular risks associated with hypertension and diabetes discussed. Diet and nutrition discussed. Follow-up in 6 months.       Relevant Orders   POCT HgB A1C (Completed)     Endocrine   Dyslipidemia associated with type 2 diabetes mellitus (HCC) (Chronic)    Stable.  Diet and nutrition discussed. Continue atorvastatin 20 mg daily.        Musculoskeletal and Integument   Skin lesion   Relevant Orders   Ambulatory referral to Dermatology   Patient Instructions  Health Maintenance After Age 41 After age 56, you are at a higher risk for certain long-term diseases and infections as well as injuries from falls. Falls are a major cause of broken bones and head  injuries in people who are older than age 64. Getting regular preventive care can help to keep you healthy and well. Preventive care includes getting regular testing and making lifestyle changes as recommended by your health care provider. Talk with your health care provider about: Which screenings and tests you should have. A screening is a test that checks for a disease when you have no symptoms. A diet and exercise plan that is right for you. What should I know about screenings and tests to prevent falls? Screening and testing are the best ways to find a health problem early. Early diagnosis and treatment give you the best chance of managing medical conditions that are common after age 67. Certain conditions and lifestyle choices may make you more likely to have a fall. Your health care provider may recommend: Regular vision checks. Poor vision and conditions such as cataracts can make you more likely to have a fall. If you wear glasses, make sure to get your prescription updated if your vision changes. Medicine review. Work with your health care provider to regularly review all of the medicines you are taking, including over-the-counter medicines. Ask your health care provider about any side effects that may make you more likely to have a fall. Tell your health care provider if any medicines that you  take make you feel dizzy or sleepy. Strength and balance checks. Your health care provider may recommend certain tests to check your strength and balance while standing, walking, or changing positions. Foot health exam. Foot pain and numbness, as well as not wearing proper footwear, can make you more likely to have a fall. Screenings, including: Osteoporosis screening. Osteoporosis is a condition that causes the bones to get weaker and break more easily. Blood pressure screening. Blood pressure changes and medicines to control blood pressure can make you feel dizzy. Depression screening. You may be more  likely to have a fall if you have a fear of falling, feel depressed, or feel unable to do activities that you used to do. Alcohol use screening. Using too much alcohol can affect your balance and may make you more likely to have a fall. Follow these instructions at home: Lifestyle Do not drink alcohol if: Your health care provider tells you not to drink. If you drink alcohol: Limit how much you have to: 0-1 drink a day for women. 0-2 drinks a day for men. Know how much alcohol is in your drink. In the [place] one drink equals one 12 oz bottle of beer (355 mL), one 5 oz glass of wine (148 mL), or one 1 oz glass of hard liquor (44 mL). Do not use any products that contain nicotine or tobacco. These products include cigarettes, chewing tobacco, and vaping devices, such as e-cigarettes. If you need help quitting, ask your health care provider. Activity  Follow a regular exercise program to stay fit. This will help you maintain your balance. Ask your health care provider what types of exercise are appropriate for you. If you need a cane or walker, use it as recommended by your health care provider. Wear supportive shoes that have nonskid soles. Safety  Remove any tripping hazards, such as rugs, cords, and clutter. Install safety equipment such as grab bars in bathrooms and safety rails on stairs. Keep rooms and walkways well-lit. General instructions Talk with your health care provider about your risks for falling. Tell your health care provider if: You fall. Be sure to tell your health care provider about all falls, even ones that seem minor. You feel dizzy, tiredness (fatigue), or off-balance. Take over-the-counter and prescription medicines only as told by your health care provider. These include supplements. Eat a healthy diet and maintain a healthy weight. A healthy diet includes low-fat dairy products, low-fat (lean) meats, and fiber from whole grains, beans, and lots of fruits and  vegetables. Stay current with your vaccines. Schedule regular health, dental, and eye exams. Summary Having a healthy lifestyle and getting preventive care can help to protect your health and wellness after age 46. Screening and testing are the best way to find a health problem early and help you avoid having a fall. Early diagnosis and treatment give you the best chance for managing medical conditions that are more common for people who are older than age 36. Falls are a major cause of broken bones and head injuries in people who are older than age 28. Take precautions to prevent a fall at home. Work with your health care provider to learn what changes you can make to improve your health and wellness and to prevent falls. This information is not intended to replace advice given to you by your health care provider. Make sure you discuss any questions you have with your health care provider. Document Revised: 10/07/2020 Document Reviewed: 10/07/2020 Elsevier Patient Education  2023  Elsevier Inc.    Agustina Caroli, MD [facility]

## 2022-02-26 NOTE — Patient Instructions (Signed)
Health Maintenance After Age 81 After age 81, you are at a higher risk for certain long-term diseases and infections as well as injuries from falls. Falls are a major cause of broken bones and head injuries in people who are older than age 81. Getting regular preventive care can help to keep you healthy and well. Preventive care includes getting regular testing and making lifestyle changes as recommended by your health care provider. Talk with your health care provider about: Which screenings and tests you should have. A screening is a test that checks for a disease when you have no symptoms. A diet and exercise plan that is right for you. What should I know about screenings and tests to prevent falls? Screening and testing are the best ways to find a health problem early. Early diagnosis and treatment give you the best chance of managing medical conditions that are common after age 81. Certain conditions and lifestyle choices may make you more likely to have a fall. Your health care provider may recommend: Regular vision checks. Poor vision and conditions such as cataracts can make you more likely to have a fall. If you wear glasses, make sure to get your prescription updated if your vision changes. Medicine review. Work with your health care provider to regularly review all of the medicines you are taking, including over-the-counter medicines. Ask your health care provider about any side effects that may make you more likely to have a fall. Tell your health care provider if any medicines that you take make you feel dizzy or sleepy. Strength and balance checks. Your health care provider may recommend certain tests to check your strength and balance while standing, walking, or changing positions. Foot health exam. Foot pain and numbness, as well as not wearing proper footwear, can make you more likely to have a fall. Screenings, including: Osteoporosis screening. Osteoporosis is a condition that causes  the bones to get weaker and break more easily. Blood pressure screening. Blood pressure changes and medicines to control blood pressure can make you feel dizzy. Depression screening. You may be more likely to have a fall if you have a fear of falling, feel depressed, or feel unable to do activities that you used to do. Alcohol use screening. Using too much alcohol can affect your balance and may make you more likely to have a fall. Follow these instructions at home: Lifestyle Do not drink alcohol if: Your health care provider tells you not to drink. If you drink alcohol: Limit how much you have to: 0-1 drink a day for women. 0-2 drinks a day for men. Know how much alcohol is in your drink. In the [place] one drink equals one 12 oz bottle of beer (355 mL), one 5 oz glass of wine (148 mL), or one 1 oz glass of hard liquor (44 mL). Do not use any products that contain nicotine or tobacco. These products include cigarettes, chewing tobacco, and vaping devices, such as e-cigarettes. If you need help quitting, ask your health care provider. Activity  Follow a regular exercise program to stay fit. This will help you maintain your balance. Ask your health care provider what types of exercise are appropriate for you. If you need a cane or walker, use it as recommended by your health care provider. Wear supportive shoes that have nonskid soles. Safety  Remove any tripping hazards, such as rugs, cords, and clutter. Install safety equipment such as grab bars in bathrooms and safety rails on stairs. Keep rooms and walkways   well-lit. General instructions Talk with your health care provider about your risks for falling. Tell your health care provider if: You fall. Be sure to tell your health care provider about all falls, even ones that seem minor. You feel dizzy, tiredness (fatigue), or off-balance. Take over-the-counter and prescription medicines only as told by your health care provider. These include  supplements. Eat a healthy diet and maintain a healthy weight. A healthy diet includes low-fat dairy products, low-fat (lean) meats, and fiber from whole grains, beans, and lots of fruits and vegetables. Stay current with your vaccines. Schedule regular health, dental, and eye exams. Summary Having a healthy lifestyle and getting preventive care can help to protect your health and wellness after age 81. Screening and testing are the best way to find a health problem early and help you avoid having a fall. Early diagnosis and treatment give you the best chance for managing medical conditions that are more common for people who are older than age 81. Falls are a major cause of broken bones and head injuries in people who are older than age 81. Take precautions to prevent a fall at home. Work with your health care provider to learn what changes you can make to improve your health and wellness and to prevent falls. This information is not intended to replace advice given to you by your health care provider. Make sure you discuss any questions you have with your health care provider. Document Revised: 10/07/2020 Document Reviewed: 10/07/2020 Elsevier Patient Education  2023 Elsevier Inc.  

## 2022-02-26 NOTE — Assessment & Plan Note (Signed)
Well-controlled hypertension. Continue metoprolol succinate 50 mg daily, losartan 50 mg daily, hydrochlorothiazide 25 mg and amlodipine 5 mg daily BP Readings from Last 3 Encounters:  02/26/22 120/64  08/25/21 116/64  06/09/21 130/70  Well-controlled diabetes with hemoglobin A1c of 6.5. Continue metformin 500 mg twice a day along with glipizide 5 mg twice a day.  No hypoglycemic events. Cardiovascular risks associated with hypertension and diabetes discussed. Diet and nutrition discussed. Follow-up in 6 months.

## 2022-02-26 NOTE — Assessment & Plan Note (Signed)
Stable.  Diet and nutrition discussed. Continue atorvastatin 20 mg daily. 

## 2022-03-30 ENCOUNTER — Other Ambulatory Visit: Payer: Self-pay | Admitting: Emergency Medicine

## 2022-03-30 DIAGNOSIS — E1159 Type 2 diabetes mellitus with other circulatory complications: Secondary | ICD-10-CM

## 2022-03-30 DIAGNOSIS — I119 Hypertensive heart disease without heart failure: Secondary | ICD-10-CM

## 2022-03-30 DIAGNOSIS — E1165 Type 2 diabetes mellitus with hyperglycemia: Secondary | ICD-10-CM

## 2022-05-03 ENCOUNTER — Other Ambulatory Visit: Payer: Self-pay | Admitting: Emergency Medicine

## 2022-05-03 DIAGNOSIS — E1159 Type 2 diabetes mellitus with other circulatory complications: Secondary | ICD-10-CM

## 2022-06-28 ENCOUNTER — Other Ambulatory Visit: Payer: Self-pay | Admitting: Cardiology

## 2022-06-28 ENCOUNTER — Other Ambulatory Visit: Payer: Self-pay | Admitting: Emergency Medicine

## 2022-06-28 DIAGNOSIS — I10 Essential (primary) hypertension: Secondary | ICD-10-CM

## 2022-06-28 DIAGNOSIS — E1165 Type 2 diabetes mellitus with hyperglycemia: Secondary | ICD-10-CM

## 2022-06-28 DIAGNOSIS — I119 Hypertensive heart disease without heart failure: Secondary | ICD-10-CM

## 2022-07-10 NOTE — Progress Notes (Unsigned)
Cardiology Office Note:    Date:  07/10/2022   ID:  Angela Chambers, DOB 07/04/1940, MRN [id number]  PCP:  Horald Pollen, [place] Providers Cardiologist:  None { Click to update primary MD,subspecialty MD or APP then REFRESH:1}    Referring MD: Horald Pollen, *   No chief complaint on file. ***  History of Present Illness:    Angela Chambers is a 82 y.o. female with a hx of ***  Past Medical History:  Diagnosis Date   Arthritis    Cataract    Diabetes mellitus    Diabetes mellitus without complication ([place])    Phreesia 05/15/2020   Fibroids    Glaucoma    Glaucoma    Phreesia 05/15/2020   Hyperlipidemia    Hypertension 07 /2013    WITH EF GREATER THAN 55 % WITH MILD MR BUT NO PROLASPE ,ESSENTIALLY NORMAL DONE TO EVALUATE PALPITATIONS .   Obesity (BMI 30.0-34.9)    Osteoporosis    Palpitations    CONTROLLED ON BETA BLOCKER   Symptomatic anemia post operative blood loss 05/12/2020    Past Surgical History:  Procedure Laterality Date   ABDOMINAL HYSTERECTOMY  10/18/ 1982   TAH-BSO for fibroids and adenomatous hyperplasia   CARDIAC CATHETERIZATION  08/2009   NONOBSTRUCTIVE CAD WITH 20% IN THE LAD AND 20 % IN THE RCA NORMAL EF 65%   CARDIAC CATHETERIZATION N/A 07/05/2015   Procedure: Left Heart Cath and Coronary Angiography;  Surgeon: Leonie Man, MD;  Location: [facility];  Service: Cardiovascular: Angiographically minimal CAD. EF 60-65%   CPET-MET Test (Cardiopulmonary Exercise Test)  03/06/2013   Adequate effort at 1.11. Peak VO2 12.3 is 86% in the normal range. Heart rate was just outside the normal range target being 127 beats a minute but this was 86% of projected. She did have an ischemic pattern after the anterograde threshold with increased heart rate and blunting of stroke volume during the last 3 minutes of exercise, but was asymptomatic.   JOINT REPLACEMENT  09/05/2009   L knee   KNEE SURGERY  1984   per  pt screws in R knee   NM MYOVIEW LTD  06/2015   FALSE + GXT - Images Accurate:  TM - 6 min, 7 METs (93% MPHR) --> chest discomfort, notable ST depressions => Hight Risk Tieler Cournoyer TM Score, but NO scintigraphic evidence of ischemia or infarction. 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  Normal   TUBAL LIGATION      Current Medications: No outpatient medications have been marked as taking for the 07/14/22 encounter (Appointment) with Ledora Bottcher, [place].     Allergies:   Lisinopril and Metformin and related   Social History   Socioeconomic History   Marital status: Married    Spouse name: Not on file   Number of children: 3   Years of education: Not on file   Highest education level: Some college, no degree  Occupational History   Occupation: retired  Tobacco Use  Smoking status: Former    Packs/day: 0.50    Years: 20.00    Total pack years: 10.00    Types: Cigarettes    Quit date: 07/13/1981    Years since quitting: 41.0   Smokeless tobacco: Never  Vaping Use   Vaping Use: Never used  Substance and Sexual Activity   Alcohol use: No   Drug use: No   Sexual activity: Yes  Other Topics Concern   Not on file  Social History Narrative   SHE IS MARRIED ,MOTHER OF 4, GRANDMOTHER OF 6. SHE DOES EXERCISE BUT SOMEWHAT LIMITED BECAUSE OF HER KNEE (S/P SURGERY 2015).   SHE QUIT SMOKING IN 1980 AND SHE DOES NOT DRINK   Social Determinants of Health   Financial Resource Strain: Low Risk  (10/13/2021)   Overall Financial Resource Strain (CARDIA)     Difficulty of Paying Living Expenses: Not hard at all  Food Insecurity: No Food Insecurity (10/13/2021)   Hunger Vital Sign    Worried About Running Out of Food in the Last Year: Never true    Ran Out of Food in the Last Year: Never true  Transportation Needs: No Transportation Needs (10/13/2021)   PRAPARE - Hydrologist (Medical): No    Lack of Transportation (Non-Medical): No  Physical Activity: Insufficiently Active (10/13/2021)   Exercise Vital Sign    Days of Exercise per Week: 3 days    Minutes of Exercise per Session: 30 min  Stress: No Stress Concern Present (10/13/2021)   [place]    Feeling of Stress : Not at all  Social Connections: [place] (10/13/2021)   Social Connection and Isolation Panel [NHANES]    Frequency of Communication with Friends and Family: Three times a week    Frequency of Social Gatherings with Friends and Family: Three times a week    Attends Religious Services: More than 4 times per year    Active Member of Clubs or Organizations: Yes    Attends Music therapist: More than 4 times per year    Marital Status: Married     Family History: The patient's ***family history includes Diabetes in her brother, mother, and sister; Heart disease in her mother; Hyperlipidemia in her brother, mother, sister, and sister; Hypertension in her sister; Prostate cancer in her son.  ROS:   Please see the history of present illness.    *** All other systems reviewed and are negative.  EKGs/Labs/Other Studies Reviewed:    The following studies were reviewed today: ***  EKG:  EKG is *** ordered today.  The ekg ordered today demonstrates ***  Recent Labs: 08/25/2021: ALT 13; BUN 21; Creatinine, Ser 0.95; Hemoglobin 12.2; Platelets 297.0; Potassium 3.8; Sodium 141  Recent Lipid Panel    Component Value Date/Time   CHOL 241 (H) 08/25/2021 1059   CHOL 187  05/23/2019 1150   TRIG 121.0 08/25/2021 1059   HDL 67.20 08/25/2021 1059   HDL 76 05/23/2019 1150   CHOLHDL 4 08/25/2021 1059   VLDL 24.2 08/25/2021 1059   LDLCALC 150 (H) 08/25/2021 1059   LDLCALC 94 05/23/2019 1150     Risk Assessment/Calculations:   {Does this patient have ATRIAL FIBRILLATION?[phone]}  No BP recorded.  {Refresh Note OR Click here to enter BP  :1}***         Physical Exam:    VS:  LMP  (LMP Unknown)     Wt Readings from  Last 3 Encounters:  02/26/22 192 lb 8 oz (87.3 kg)  06/09/21 192 lb (87.1 kg)  02/25/21 186 lb (84.4 kg)     GEN: *** Well nourished, well developed in no acute distress HEENT: Normal NECK: No JVD; No carotid bruits LYMPHATICS: No lymphadenopathy CARDIAC: ***RRR, no murmurs, rubs, gallops RESPIRATORY:  Clear to auscultation without rales, wheezing or rhonchi  ABDOMEN: Soft, non-tender, non-distended MUSCULOSKELETAL:  No edema; No deformity  SKIN: Warm and dry NEUROLOGIC:  Alert and oriented x 3 PSYCHIATRIC:  Normal affect   ASSESSMENT:    No diagnosis found. PLAN:    In order of problems listed above:  ***      {Are you ordering a CV Procedure (e.g. stress test, cath, DCCV, TEE, etc)?   Press F2        :[id number]    Medication Adjustments/Labs and Tests Ordered: Current medicines are reviewed at length with the patient today.  Concerns regarding medicines are outlined above.  No orders of the defined types were placed in this encounter.  No orders of the defined types were placed in this encounter.   There are no Patient Instructions on file for this visit.   Signed, Ledora Bottcher, [place]  07/10/2022 3:46 PM    [facility]

## 2022-07-14 ENCOUNTER — Ambulatory Visit: Payer: Medicare Other | Attending: Physician Assistant | Admitting: Physician Assistant

## 2022-07-14 ENCOUNTER — Encounter: Payer: Self-pay | Admitting: Physician Assistant

## 2022-07-14 VITALS — BP 136/74 | HR 73 | Ht 65.0 in | Wt 190.6 lb

## 2022-07-14 DIAGNOSIS — I10 Essential (primary) hypertension: Secondary | ICD-10-CM

## 2022-07-14 DIAGNOSIS — E785 Hyperlipidemia, unspecified: Secondary | ICD-10-CM | POA: Diagnosis not present

## 2022-07-14 DIAGNOSIS — I119 Hypertensive heart disease without heart failure: Secondary | ICD-10-CM

## 2022-07-14 DIAGNOSIS — E119 Type 2 diabetes mellitus without complications: Secondary | ICD-10-CM | POA: Diagnosis not present

## 2022-07-14 MED ORDER — METOPROLOL SUCCINATE ER 50 MG PO TB24: ORAL_TABLET | ORAL | 0 refills | Status: DC

## 2022-07-14 MED ORDER — HYDROCHLOROTHIAZIDE 25 MG PO TABS: ORAL_TABLET | ORAL | 0 refills | Status: DC

## 2022-07-14 MED ORDER — AMLODIPINE BESYLATE 5 MG PO TABS: ORAL_TABLET | ORAL | 1 refills | Status: DC

## 2022-07-14 MED ORDER — ATORVASTATIN CALCIUM 20 MG PO TABS: ORAL_TABLET | ORAL | 0 refills | Status: DC

## 2022-07-14 NOTE — Patient Instructions (Signed)
Medication Instructions:  No Changes *If you need a refill on your cardiac medications before your next appointment, please call your pharmacy*   Lab Work: No Labs If you have labs (blood work) drawn today and your tests are completely normal, you will receive your results only by: [place] (if you have MyChart) OR A paper copy in the mail If you have any lab test that is abnormal or we need to change your treatment, we will call you to review the results.   Testing/Procedures: No Testing   Follow-Up: At [facility], you and your health needs are our priority.  As part of our continuing mission to provide you with exceptional heart care, we have created designated Provider Care Teams.  These Care Teams include your primary Cardiologist (physician) and Advanced Practice Providers (APPs -  Physician Assistants and Nurse Practitioners) who all work together to provide you with the care you need, when you need it.  We recommend signing up for the patient portal called "MyChart".  Sign up information is provided on this After Visit Summary.  MyChart is used to connect with patients for Virtual Visits (Telemedicine).  Patients are able to view lab/test results, encounter notes, upcoming appointments, etc.  Non-urgent messages can be sent to your provider as well.   To learn more about what you can do with MyChart, go to [email or web address].    Your next appointment:   1 year(s)  Provider:   Glenetta Hew, MD

## 2022-08-26 ENCOUNTER — Encounter: Payer: Self-pay | Admitting: Emergency Medicine

## 2022-08-26 ENCOUNTER — Ambulatory Visit: Payer: Medicare Other | Admitting: Emergency Medicine

## 2022-08-26 VITALS — BP 120/74 | HR 70 | Temp 98.0°F | Ht 65.0 in | Wt 191.0 lb

## 2022-08-26 DIAGNOSIS — E1159 Type 2 diabetes mellitus with other circulatory complications: Secondary | ICD-10-CM

## 2022-08-26 DIAGNOSIS — I152 Hypertension secondary to endocrine disorders: Secondary | ICD-10-CM

## 2022-08-26 DIAGNOSIS — E785 Hyperlipidemia, unspecified: Secondary | ICD-10-CM

## 2022-08-26 DIAGNOSIS — E1169 Type 2 diabetes mellitus with other specified complication: Secondary | ICD-10-CM | POA: Diagnosis not present

## 2022-08-26 LAB — COMPREHENSIVE METABOLIC PANEL
ALT: 14 U/L (ref 0–35)
AST: 14 U/L (ref 0–37)
Albumin: 4.3 g/dL (ref 3.5–5.2)
Alkaline Phosphatase: 72 U/L (ref 39–117)
BUN: 25 mg/dL — ABNORMAL HIGH (ref 6–23)
CO2: 30 mEq/L (ref 19–32)
Calcium: 10.4 mg/dL (ref 8.4–10.5)
Chloride: 102 mEq/L (ref 96–112)
Creatinine, Ser: 1.02 mg/dL (ref 0.40–1.20)
GFR: 51.38 mL/min — ABNORMAL LOW (ref >60.00)
Glucose, Bld: 144 mg/dL — ABNORMAL HIGH (ref 70–99)
Potassium: 3.8 mEq/L (ref 3.5–5.1)
Sodium: 141 mEq/L (ref 135–145)
Total Bilirubin: 0.5 mg/dL (ref 0.2–1.2)
Total Protein: 7.3 g/dL (ref 6.0–8.3)

## 2022-08-26 LAB — LIPID PANEL
Cholesterol: 253 mg/dL — ABNORMAL HIGH (ref 0–200)
HDL: 69.4 mg/dL (ref >39.00)
LDL Cholesterol: 152 mg/dL — ABNORMAL HIGH (ref 0–99)
NonHDL: 183.95
Total CHOL/HDL Ratio: 4
Triglycerides: 160 mg/dL — ABNORMAL HIGH (ref 0.0–149.0)
VLDL: 32 mg/dL (ref 0.0–40.0)

## 2022-08-26 LAB — CBC WITH DIFFERENTIAL/PLATELET
Basophils Absolute: 0.1 10*3/uL (ref 0.0–0.1)
Basophils Relative: 1 % (ref 0.0–3.0)
Eosinophils Absolute: 0.2 10*3/uL (ref 0.0–0.7)
Eosinophils Relative: 3 % (ref 0.0–5.0)
HCT: 37.7 % (ref 36.0–46.0)
Hemoglobin: 13.2 g/dL (ref 12.0–15.0)
Lymphocytes Relative: 41.9 % (ref 12.0–46.0)
Lymphs Abs: 2.2 10*3/uL (ref 0.7–4.0)
MCHC: 35.1 g/dL (ref 30.0–36.0)
MCV: 86.8 fl (ref 78.0–100.0)
Monocytes Absolute: 0.5 10*3/uL (ref 0.1–1.0)
Monocytes Relative: 8.8 % (ref 3.0–12.0)
Neutro Abs: 2.4 10*3/uL (ref 1.4–7.7)
Neutrophils Relative %: 45.3 % (ref 43.0–77.0)
Platelets: 320 10*3/uL (ref 150.0–400.0)
RBC: 4.34 Mil/uL (ref 3.87–5.11)
RDW: 15.2 % (ref 11.5–15.5)
WBC: 5.2 10*3/uL (ref 4.0–10.5)

## 2022-08-26 LAB — POCT GLYCOSYLATED HEMOGLOBIN (HGB A1C): Hemoglobin A1C: 6.6 % — AB (ref 4.0–5.6)

## 2022-08-26 LAB — MICROALBUMIN / CREATININE URINE RATIO
Creatinine,U: 126.9 mg/dL
Microalb Creat Ratio: 1.2 mg/g (ref 0.0–30.0)
Microalb, Ur: 1.5 mg/dL (ref 0.0–1.9)

## 2022-08-26 MED ORDER — GLIPIZIDE 5 MG PO TABS: 5.0000 mg | ORAL_TABLET | Freq: Two times a day (BID) | ORAL | 3 refills | Status: DC

## 2022-08-26 NOTE — Patient Instructions (Signed)
Health Maintenance After Age 82 After age 82, you are at a higher risk for certain long-term diseases and infections as well as injuries from falls. Falls are a major cause of broken bones and head injuries in people who are older than age 82. Getting regular preventive care can help to keep you healthy and well. Preventive care includes getting regular testing and making lifestyle changes as recommended by your health care provider. Talk with your health care provider about: Which screenings and tests you should have. A screening is a test that checks for a disease when you have no symptoms. A diet and exercise plan that is right for you. What should I know about screenings and tests to prevent falls? Screening and testing are the best ways to find a health problem early. Early diagnosis and treatment give you the best chance of managing medical conditions that are common after age 82. Certain conditions and lifestyle choices may make you more likely to have a fall. Your health care provider may recommend: Regular vision checks. Poor vision and conditions such as cataracts can make you more likely to have a fall. If you wear glasses, make sure to get your prescription updated if your vision changes. Medicine review. Work with your health care provider to regularly review all of the medicines you are taking, including over-the-counter medicines. Ask your health care provider about any side effects that may make you more likely to have a fall. Tell your health care provider if any medicines that you take make you feel dizzy or sleepy. Strength and balance checks. Your health care provider may recommend certain tests to check your strength and balance while standing, walking, or changing positions. Foot health exam. Foot pain and numbness, as well as not wearing proper footwear, can make you more likely to have a fall. Screenings, including: Osteoporosis screening. Osteoporosis is a condition that causes  the bones to get weaker and break more easily. Blood pressure screening. Blood pressure changes and medicines to control blood pressure can make you feel dizzy. Depression screening. You may be more likely to have a fall if you have a fear of falling, feel depressed, or feel unable to do activities that you used to do. Alcohol use screening. Using too much alcohol can affect your balance and may make you more likely to have a fall. Follow these instructions at home: Lifestyle Do not drink alcohol if: Your health care provider tells you not to drink. If you drink alcohol: Limit how much you have to: 0-1 drink a day for women. 0-2 drinks a day for men. Know how much alcohol is in your drink. In the [place] one drink equals one 12 oz bottle of beer (355 mL), one 5 oz glass of wine (148 mL), or one 1 oz glass of hard liquor (44 mL). Do not use any products that contain nicotine or tobacco. These products include cigarettes, chewing tobacco, and vaping devices, such as e-cigarettes. If you need help quitting, ask your health care provider. Activity  Follow a regular exercise program to stay fit. This will help you maintain your balance. Ask your health care provider what types of exercise are appropriate for you. If you need a cane or walker, use it as recommended by your health care provider. Wear supportive shoes that have nonskid soles. Safety  Remove any tripping hazards, such as rugs, cords, and clutter. Install safety equipment such as grab bars in bathrooms and safety rails on stairs. Keep rooms and walkways   well-lit. General instructions Talk with your health care provider about your risks for falling. Tell your health care provider if: You fall. Be sure to tell your health care provider about all falls, even ones that seem minor. You feel dizzy, tiredness (fatigue), or off-balance. Take over-the-counter and prescription medicines only as told by your health care provider. These include  supplements. Eat a healthy diet and maintain a healthy weight. A healthy diet includes low-fat dairy products, low-fat (lean) meats, and fiber from whole grains, beans, and lots of fruits and vegetables. Stay current with your vaccines. Schedule regular health, dental, and eye exams. Summary Having a healthy lifestyle and getting preventive care can help to protect your health and wellness after age 82. Screening and testing are the best way to find a health problem early and help you avoid having a fall. Early diagnosis and treatment give you the best chance for managing medical conditions that are more common for people who are older than age 82. Falls are a major cause of broken bones and head injuries in people who are older than age 82. Take precautions to prevent a fall at home. Work with your health care provider to learn what changes you can make to improve your health and wellness and to prevent falls. This information is not intended to replace advice given to you by your health care provider. Make sure you discuss any questions you have with your health care provider. Document Revised: 10/07/2020 Document Reviewed: 10/07/2020 Elsevier Patient Education  2023 [employer]  

## 2022-08-26 NOTE — Progress Notes (Signed)
Angela Chambers 82 y.o.   Chief Complaint  Patient presents with   Medical Management of Chronic Issues    F/u appt DM, A1c check , no concerns     HISTORY OF PRESENT ILLNESS: This is a 82 y.o. female here for 14-month follow-up of chronic medical problems including diabetes and hypertension Overall doing well.  Has no complaints or medical concerns today.  HPI   Prior to Admission medications   Medication Sig Start Date End Date Taking? Authorizing Provider  acetaminophen (TYLENOL) 500 MG tablet Take 2 tablets (1,000 mg total) by mouth every 8 (eight) hours. 05/14/20  Yes Mercy Riding, MD  amLODipine (NORVASC) 5 MG tablet TAKE 1 TABLET(5 MG) BY MOUTH DAILY 07/14/22  Yes Duke, Tami Lin, PA  aspirin 81 MG tablet Take 81 mg by mouth daily.   Yes [provider]  atorvastatin (LIPITOR) 20 MG tablet TAKE 1 TABLET(20 MG) BY MOUTH DAILY 07/14/22  Yes Duke, Tami Lin, PA  Blood Glucose Monitoring Suppl (BLOOD GLUCOSE MONITOR SYSTEM) w/Device KIT 1 Units by Does not apply route 2 (two) times daily. 02/07/18  Yes Timmothy Euler, [place] D, PA-C  brimonidine (ALPHAGAN) 0.2 % ophthalmic solution Place 1 drop into both eyes 2 (two) times daily. 05/06/20  Yes [provider]  Co-Enzyme Q-10 30 MG CAPS Take 30 mg by mouth daily.   Yes [provider]  dorzolamide-timolol (COSOPT) 22.3-6.8 MG/ML ophthalmic solution Place 1 drop into both eyes 2 (two) times daily. 09/23/17  Yes [provider]  glipiZIDE (GLUCOTROL) 5 MG tablet TAKE 1 TABLET(5 MG) BY MOUTH TWICE DAILY BEFORE A MEAL 05/04/22  Yes Dason Mosley, Ines Bloomer, MD  hydrochlorothiazide (HYDRODIURIL) 25 MG tablet TAKE 1 TABLET(25 MG) BY MOUTH DAILY 07/14/22  Yes Duke, Tami Lin, PA  iron polysaccharides (NIFEREX) 150 MG capsule Take 1 capsule (150 mg total) by mouth daily. 08/25/21  Yes Alasha Mcguinness, Ines Bloomer, MD  latanoprost (XALATAN) 0.005 % ophthalmic solution Place 1 drop into both eyes at bedtime. 09/21/18   Yes [provider]  losartan (COZAAR) 50 MG tablet Take 50 mg by mouth daily.   Yes [provider]  metFORMIN (GLUCOPHAGE) 500 MG tablet TAKE 1 TABLET(500 MG) BY MOUTH TWICE DAILY WITH A MEAL 06/28/22  Yes Manfred Laspina, Ines Bloomer, MD  metoprolol succinate (TOPROL-XL) 50 MG 24 hr tablet TAKE 1 TABLET BY MOUTH DAILY IMMEDIATELY FOLLOWING A MEAL 07/14/22  Yes Duke, Tami Lin, PA  Multiple Vitamin (MULTIVITAMIN) tablet Take 1 tablet by mouth daily.   Yes [provider]    Allergies  Allergen Reactions   Lisinopril Cough   Metformin And Related     Diarrhea on 1000mg  BID, tolerates 500 mg BID    Patient Active Problem List   Diagnosis Date Noted   Skin lesion 02/26/2022   Hypertension associated with diabetes ([place]) 08/16/2018   Abnormal stress echo EKG portion with normal echo -> imaging confirmed the correct with normal coronaries 06/21/2015   OA (osteoarthritis) of knee 09/25/2013   Obesity (BMI 30.0-34.9)    Hypertensive heart disease without CHF 11/30/2011   Hyperlipidemia 08/25/2011   Other and combined forms of senile cataract 07/04/2011   Primary open angle glaucoma of both eyes, severe stage 07/04/2011   Dyslipidemia associated with type 2 diabetes mellitus ([place]) 02/22/2009   PERSONAL HISTORY OF COLONIC POLYPS 02/22/2009    Past Medical History:  Diagnosis Date   Arthritis    Cataract    Diabetes mellitus    Diabetes mellitus  without complication ([place])    Phreesia 05/15/2020   Fibroids    Glaucoma    Glaucoma    Phreesia 05/15/2020   Hyperlipidemia    Hypertension 07 /2013    WITH EF GREATER THAN 55 % WITH MILD MR BUT NO PROLASPE ,ESSENTIALLY NORMAL DONE TO EVALUATE PALPITATIONS .   Obesity (BMI 30.0-34.9)    Osteoporosis    Palpitations    CONTROLLED ON BETA BLOCKER   Symptomatic anemia post operative blood loss 05/12/2020    Past Surgical History:  Procedure Laterality Date   ABDOMINAL HYSTERECTOMY  10/18/ 1982   TAH-BSO for  fibroids and adenomatous hyperplasia   CARDIAC CATHETERIZATION  08/2009   NONOBSTRUCTIVE CAD WITH 20% IN THE LAD AND 20 % IN THE RCA NORMAL EF 65%   CARDIAC CATHETERIZATION N/A 07/05/2015   Procedure: Left Heart Cath and Coronary Angiography;  Surgeon: Leonie Man, MD;  Location: [facility];  Service: Cardiovascular: Angiographically minimal CAD. EF 60-65%   CPET-MET Test (Cardiopulmonary Exercise Test)  03/06/2013   Adequate effort at 1.11. Peak VO2 12.3 is 86% in the normal range. Heart rate was just outside the normal range target being 127 beats a minute but this was 86% of projected. She did have an ischemic pattern after the anterograde threshold with increased heart rate and blunting of stroke volume during the last 3 minutes of exercise, but was asymptomatic.   JOINT REPLACEMENT  09/05/2009   L knee   KNEE SURGERY  1984   per pt screws in R knee   NM MYOVIEW LTD  06/2015   FALSE + GXT - Images Accurate:  TM - 6 min, 7 METs (93% MPHR) --> chest discomfort, notable ST depressions => Hight Risk Duke TM Score, but NO scintigraphic evidence of ischemia or infarction. EF 74$   ORIF FEMUR FRACTURE Right 05/10/2020   Procedure: OPEN REDUCTION INTERNAL FIXATION (ORIF) DISTAL FEMUR FRACTURE;  Surgeon: Shona Needles, MD;  Location: [facility];  Service: Orthopedics;  Laterality: Right;   TOTAL KNEE ARTHROPLASTY Right 09/25/2013   Procedure: RIGHT TOTAL KNEE ARTHROPLASTY;  Surgeon: Gearlean Alf, MD;  Location: [facility];  Service: Orthopedics;  Laterality: Right;   TRANSTHORACIC ECHOCARDIOGRAM  July 2013   EF greater than 55%, mild MR no prolapse.  Normal   TUBAL LIGATION      Social History   Socioeconomic History   Marital status: Married    Spouse name: Not on file   Number of children: 3   Years of education: Not on file   Highest education level: Some college, no degree  Occupational History   Occupation: retired  Tobacco Use   Smoking status: Former    Packs/day: 0.50     Years: 20.00    Additional pack years: 0.00    Total pack years: 10.00    Types: Cigarettes    Quit date: 07/13/1981    Years since quitting: 41.1   Smokeless tobacco: Never  Vaping Use   Vaping Use: Never used  Substance and Sexual Activity   Alcohol use: No   Drug use: No   Sexual activity: Yes  Other Topics Concern   Not on file  Social History Narrative   SHE IS MARRIED ,MOTHER OF 4, GRANDMOTHER OF 6. SHE DOES EXERCISE BUT SOMEWHAT LIMITED BECAUSE OF HER KNEE (S/P SURGERY 2015).   SHE QUIT SMOKING IN 1980 AND SHE DOES NOT DRINK   Social Determinants of Health   Financial Resource Strain: Low Risk  (  08/24/2022)   Overall Financial Resource Strain (CARDIA)    Difficulty of Paying Living Expenses: Not hard at all  Food Insecurity: No Food Insecurity (08/24/2022)   Hunger Vital Sign    Worried About Running Out of Food in the Last Year: Never true    Ran Out of Food in the Last Year: Never true  Transportation Needs: No Transportation Needs (08/24/2022)   PRAPARE - Hydrologist (Medical): No    Lack of Transportation (Non-Medical): No  Physical Activity: Inactive (08/24/2022)   Exercise Vital Sign    Days of Exercise per Week: 0 days    Minutes of Exercise per Session: 30 min  Stress: No Stress Concern Present (08/24/2022)   [place]    Feeling of Stress : Not at all  Social Connections: [place] (08/24/2022)   Social Connection and Isolation Panel [NHANES]    Frequency of Communication with Friends and Family: More than three times a week    Frequency of Social Gatherings with Friends and Family: More than three times a week    Attends Religious Services: More than 4 times per year    Active Member of [employer] or Organizations: Yes    Attends Music therapist: More than 4 times per year    Marital Status: Married  Human resources officer Violence: Not At Risk  (10/13/2021)   Humiliation, Afraid, Rape, and Kick questionnaire    Fear of Current or Ex-Partner: No    Emotionally Abused: No    Physically Abused: No    Sexually Abused: No    Family History  Problem Relation Age of Onset   Diabetes Mother    Heart disease Mother    Hyperlipidemia Mother    Diabetes Sister    Hyperlipidemia Sister    Hypertension Sister    Diabetes Brother    Hyperlipidemia Brother    Prostate cancer Son        prostate   Hyperlipidemia Sister      Review of Systems  Constitutional: Negative.  Negative for chills and fever.  HENT: Negative.  Negative for congestion and sore throat.   Respiratory: Negative.  Negative for cough and shortness of breath.   Cardiovascular: Negative.  Negative for chest pain and palpitations.  Gastrointestinal: Negative.  Negative for abdominal pain, diarrhea, nausea and vomiting.  Genitourinary: Negative.  Negative for dysuria and hematuria.  Musculoskeletal: Negative.   Skin: Negative.  Negative for rash.  Neurological: Negative.  Negative for dizziness and headaches.  All other systems reviewed and are negative.   Vitals:   08/26/22 0943  BP: 120/74  Pulse: 70  Temp: 98 F (36.7 C)  SpO2: 98%    Physical Exam Vitals reviewed.  Constitutional:      Appearance: Normal appearance.  HENT:     Head: Normocephalic.     Mouth/Throat:     Mouth: Mucous membranes are moist.     Pharynx: Oropharynx is clear.  Eyes:     Extraocular Movements: Extraocular movements intact.     Conjunctiva/sclera: Conjunctivae normal.     Pupils: Pupils are equal, round, and reactive to light.  Cardiovascular:     Rate and Rhythm: Normal rate and regular rhythm.     Pulses: Normal pulses.     Heart sounds: Normal heart sounds.  Pulmonary:     Effort: Pulmonary effort is normal.     Breath sounds: Normal breath sounds.  Musculoskeletal:  Cervical back: No tenderness.  Lymphadenopathy:     Cervical: No cervical adenopathy.   Skin:    General: Skin is warm and dry.     Capillary Refill: Capillary refill takes less than 2 seconds.  Neurological:     Mental Status: She is alert and oriented to person, place, and time.  Psychiatric:        Mood and Affect: Mood normal.        Behavior: Behavior normal.    Results for orders placed or performed in visit on 08/26/22 (from the past 24 hour(s))  POCT glycosylated hemoglobin (Hb A1C)     Status: Abnormal   Collection Time: 08/26/22 10:09 AM  Result Value Ref Range   Hemoglobin A1C 6.6 (A) 4.0 - 5.6 %   HbA1c POC (<> result, manual entry)     HbA1c, POC (prediabetic range)     HbA1c, POC (controlled diabetic range)       ASSESSMENT & PLAN: A total of 42 minutes was spent with the patient and counseling/coordination of care regarding preparing for this visit, review of most recent office visit notes, review of multiple chronic medical conditions and their management, review of all medications, review of most recent blood work results including interpretation of today's hemoglobin A1c, prognosis, documentation, and need for follow-up.  Problem List Items Addressed This Visit       Cardiovascular and Mediastinum   Hypertension associated with diabetes ([place]) - Primary (Chronic)    Well-controlled hypertension Continue losartan 50 mg daily, metoprolol succinate 50 mg daily, hydrochlorothiazide 25 mg daily and amlodipine 5 mg daily Well-controlled diabetes with hemoglobin A1c of 6.6 Continue glipizide 5 mg twice a day with metformin 500 mg twice a day Cardiovascular risks associated with hypertension and diabetes discussed Benefits of exercise discussed Diet and nutrition discussed Follow-up in 6 months      Relevant Medications   glipiZIDE (GLUCOTROL) 5 MG tablet   Other Relevant Orders   POCT glycosylated hemoglobin (Hb A1C) (Completed)   CBC with Differential/Platelet   Comprehensive metabolic panel   Lipid panel   Microalbumin / creatinine urine ratio      Endocrine   Dyslipidemia associated with type 2 diabetes mellitus ([facility]) (Chronic)    Chronic stable condition Lipid profile done today Continue atorvastatin 20 mg daily       Relevant Medications   glipiZIDE (GLUCOTROL) 5 MG tablet   Other Relevant Orders   POCT glycosylated hemoglobin (Hb A1C) (Completed)   CBC with Differential/Platelet   Comprehensive metabolic panel   Lipid panel   Microalbumin / creatinine urine ratio   Patient Instructions  Health Maintenance After Age 60 After age 26, you are at a higher risk for certain long-term diseases and infections as well as injuries from falls. Falls are a major cause of broken bones and head injuries in people who are older than age 58. Getting regular preventive care can help to keep you healthy and well. Preventive care includes getting regular testing and making lifestyle changes as recommended by your health care provider. Talk with your health care provider about: Which screenings and tests you should have. A screening is a test that checks for a disease when you have no symptoms. A diet and exercise plan that is right for you. What should I know about screenings and tests to prevent falls? Screening and testing are the best ways to find a health problem early. Early diagnosis and treatment give you the best chance of managing medical conditions  that are common after age 72. Certain conditions and lifestyle choices may make you more likely to have a fall. Your health care provider may recommend: Regular vision checks. Poor vision and conditions such as cataracts can make you more likely to have a fall. If you wear glasses, make sure to get your prescription updated if your vision changes. Medicine review. Work with your health care provider to regularly review all of the medicines you are taking, including over-the-counter medicines. Ask your health care provider about any side effects that may make you more likely to have a fall.  Tell your health care provider if any medicines that you take make you feel dizzy or sleepy. Strength and balance checks. Your health care provider may recommend certain tests to check your strength and balance while standing, walking, or changing positions. Foot health exam. Foot pain and numbness, as well as not wearing proper footwear, can make you more likely to have a fall. Screenings, including: Osteoporosis screening. Osteoporosis is a condition that causes the bones to get weaker and break more easily. Blood pressure screening. Blood pressure changes and medicines to control blood pressure can make you feel dizzy. Depression screening. You may be more likely to have a fall if you have a fear of falling, feel depressed, or feel unable to do activities that you used to do. Alcohol use screening. Using too much alcohol can affect your balance and may make you more likely to have a fall. Follow these instructions at home: Lifestyle Do not drink alcohol if: Your health care provider tells you not to drink. If you drink alcohol: Limit how much you have to: 0-1 drink a day for women. 0-2 drinks a day for men. Know how much alcohol is in your drink. In the [place] one drink equals one 12 oz bottle of beer (355 mL), one 5 oz glass of wine (148 mL), or one 1 oz glass of hard liquor (44 mL). Do not use any products that contain nicotine or tobacco. These products include cigarettes, chewing tobacco, and vaping devices, such as e-cigarettes. If you need help quitting, ask your health care provider. Activity  Follow a regular exercise program to stay fit. This will help you maintain your balance. Ask your health care provider what types of exercise are appropriate for you. If you need a cane or walker, use it as recommended by your health care provider. Wear supportive shoes that have nonskid soles. Safety  Remove any tripping hazards, such as rugs, cords, and clutter. Install safety equipment  such as grab bars in bathrooms and safety rails on stairs. Keep rooms and walkways well-lit. General instructions Talk with your health care provider about your risks for falling. Tell your health care provider if: You fall. Be sure to tell your health care provider about all falls, even ones that seem minor. You feel dizzy, tiredness (fatigue), or off-balance. Take over-the-counter and prescription medicines only as told by your health care provider. These include supplements. Eat a healthy diet and maintain a healthy weight. A healthy diet includes low-fat dairy products, low-fat (lean) meats, and fiber from whole grains, beans, and lots of fruits and vegetables. Stay current with your vaccines. Schedule regular health, dental, and eye exams. Summary Having a healthy lifestyle and getting preventive care can help to protect your health and wellness after age 69. Screening and testing are the best way to find a health problem early and help you avoid having a fall. Early diagnosis and treatment give  you the best chance for managing medical conditions that are more common for people who are older than age 13. Falls are a major cause of broken bones and head injuries in people who are older than age 43. Take precautions to prevent a fall at home. Work with your health care provider to learn what changes you can make to improve your health and wellness and to prevent falls. This information is not intended to replace advice given to you by your health care provider. Make sure you discuss any questions you have with your health care provider. Document Revised: 10/07/2020 Document Reviewed: 10/07/2020 Elsevier Patient Education  [place], MD [facility]

## 2022-08-26 NOTE — Assessment & Plan Note (Signed)
Well-controlled hypertension Continue losartan 50 mg daily, metoprolol succinate 50 mg daily, hydrochlorothiazide 25 mg daily and amlodipine 5 mg daily Well-controlled diabetes with hemoglobin A1c of 6.6 Continue glipizide 5 mg twice a day with metformin 500 mg twice a day Cardiovascular risks associated with hypertension and diabetes discussed Benefits of exercise discussed Diet and nutrition discussed Follow-up in 6 months

## 2022-08-26 NOTE — Assessment & Plan Note (Signed)
Chronic stable condition Lipid profile done today Continue atorvastatin 20 mg daily

## 2022-09-26 ENCOUNTER — Other Ambulatory Visit: Payer: Self-pay | Admitting: Emergency Medicine

## 2022-09-26 DIAGNOSIS — E1165 Type 2 diabetes mellitus with hyperglycemia: Secondary | ICD-10-CM

## 2022-10-09 ENCOUNTER — Other Ambulatory Visit: Payer: Self-pay

## 2022-10-09 DIAGNOSIS — E119 Type 2 diabetes mellitus without complications: Secondary | ICD-10-CM

## 2022-10-09 MED ORDER — ATORVASTATIN CALCIUM 20 MG PO TABS
ORAL_TABLET | ORAL | 3 refills | Status: DC
Start: 2022-10-09 — End: 2023-11-29

## 2022-10-21 ENCOUNTER — Ambulatory Visit (INDEPENDENT_AMBULATORY_CARE_PROVIDER_SITE_OTHER): Payer: Medicare Other

## 2022-10-21 VITALS — Ht 65.0 in | Wt 191.0 lb

## 2022-10-21 DIAGNOSIS — Z0000 Encounter for general adult medical examination without abnormal findings: Secondary | ICD-10-CM | POA: Diagnosis not present

## 2022-10-21 NOTE — Progress Notes (Signed)
I connected with  Angela Chambers on 10/21/22 by a audio enabled telemedicine application and verified that I am speaking with the correct person using two identifiers.  Patient Location: Home  Provider Location: Office/Clinic  I discussed the limitations of evaluation and management by telemedicine. The patient expressed understanding and agreed to proceed.  Patient Medicare AWV questionnaire was completed by the patient on 10/20/2022; I have confirmed that all information answered by patient is correct and no changes since this date.    Subjective:   Angela Chambers is a 82 y.o. female who presents for Medicare Annual (Subsequent) preventive examination.  Review of Systems     Cardiac Risk Factors include: advanced age (>62men, >11 women);diabetes mellitus;dyslipidemia;family history of premature cardiovascular disease;hypertension;obesity (BMI >30kg/m2);sedentary lifestyle     Objective:    Today's Vitals   10/21/22 1031 10/21/22 1032  Weight: 191 lb (86.6 kg)   Height: 5\' 5"  (1.651 m)   PainSc: 0-No pain 0-No pain   Body mass index is 31.78 kg/m.     10/21/2022   10:34 AM 10/13/2021    2:37 PM 05/18/2020    7:02 AM 05/09/2020    3:14 PM 08/24/2017    2:25 PM 08/09/2017   11:49 AM 03/17/2016    3:26 PM  Advanced Directives  Does Patient Have a Medical Advance Directive? Yes Yes No Yes Yes Yes Yes  Type of Advance Directive Living will Healthcare Power of [place];Living will  Healthcare Power of [place];Living will Healthcare Power of [place];Living will Living will Living will  Does patient want to make changes to medical advance directive?   No - Patient declined No - Patient declined  No - Patient declined   Copy of Healthcare Power of Attorney in Chart?  No - copy requested  No - copy requested No - copy requested No - copy requested No - copy requested  Would patient like information on creating a medical advance directive?   No - Patient declined         Current Medications (verified) Outpatient Encounter Medications as of 10/21/2022  Medication Sig   acetaminophen (TYLENOL) 500 MG tablet Take 2 tablets (1,000 mg total) by mouth every 8 (eight) hours.   amLODipine (NORVASC) 5 MG tablet TAKE 1 TABLET(5 MG) BY MOUTH DAILY   aspirin 81 MG tablet Take 81 mg by mouth daily.   atorvastatin (LIPITOR) 20 MG tablet TAKE 1 TABLET(20 MG) BY MOUTH DAILY   Blood Glucose Monitoring Suppl (BLOOD GLUCOSE MONITOR SYSTEM) w/Device KIT 1 Units by Does not apply route 2 (two) times daily.   brimonidine (ALPHAGAN) 0.2 % ophthalmic solution Place 1 drop into both eyes 2 (two) times daily.   Co-Enzyme Q-10 30 MG CAPS Take 30 mg by mouth daily.   dorzolamide-timolol (COSOPT) 22.3-6.8 MG/ML ophthalmic solution Place 1 drop into both eyes 2 (two) times daily.   glipiZIDE (GLUCOTROL) 5 MG tablet Take 1 tablet (5 mg total) by mouth 2 (two) times daily before a meal.   hydrochlorothiazide (HYDRODIURIL) 25 MG tablet TAKE 1 TABLET(25 MG) BY MOUTH DAILY   iron polysaccharides (NIFEREX) 150 MG capsule Take 1 capsule (150 mg total) by mouth daily.   latanoprost (XALATAN) 0.005 % ophthalmic solution Place 1 drop into both eyes at bedtime.   losartan (COZAAR) 50 MG tablet Take 50 mg by mouth daily.   metFORMIN (GLUCOPHAGE) 500 MG tablet TAKE 1 TABLET(500 MG) BY MOUTH TWICE DAILY WITH A MEAL   metoprolol succinate (TOPROL-XL) 50 MG 24 hr  tablet TAKE 1 TABLET BY MOUTH DAILY IMMEDIATELY FOLLOWING A MEAL   Multiple Vitamin (MULTIVITAMIN) tablet Take 1 tablet by mouth daily.   No facility-administered encounter medications on file as of 10/21/2022.    Allergies (verified) Lisinopril and Metformin and related   History: Past Medical History:  Diagnosis Date   Arthritis    Cataract    Diabetes mellitus    Diabetes mellitus without complication (HCC)    Phreesia 05/15/2020   Fibroids    Glaucoma    Glaucoma    Phreesia 05/15/2020   Hyperlipidemia    Hypertension 07  /2013    WITH EF GREATER THAN 55 % WITH MILD MR BUT NO PROLASPE ,ESSENTIALLY NORMAL DONE TO EVALUATE PALPITATIONS .   Obesity (BMI 30.0-34.9)    Osteoporosis    Palpitations    CONTROLLED ON BETA BLOCKER   Symptomatic anemia post operative blood loss 05/12/2020   Past Surgical History:  Procedure Laterality Date   ABDOMINAL HYSTERECTOMY  10/18/ 1982   TAH-BSO for fibroids and adenomatous hyperplasia   CARDIAC CATHETERIZATION  08/2009   NONOBSTRUCTIVE CAD WITH 20% IN THE LAD AND 20 % IN THE RCA NORMAL EF 65%   CARDIAC CATHETERIZATION N/A 07/05/2015   Procedure: Left Heart Cath and Coronary Angiography;  Surgeon: Marykay Lex, MD;  Location: [facility] INVASIVE CV LAB;  Service: Cardiovascular: Angiographically minimal CAD. EF 60-65%   CPET-MET Test (Cardiopulmonary Exercise Test)  03/06/2013   Adequate effort at 1.11. Peak VO2 12.3 is 86% in the normal range. Heart rate was just outside the normal range target being 127 beats a minute but this was 86% of projected. She did have an ischemic pattern after the anterograde threshold with increased heart rate and blunting of stroke volume during the last 3 minutes of exercise, but was asymptomatic.   JOINT REPLACEMENT  09/05/2009   L knee   KNEE SURGERY  1984   per pt screws in R knee   NM MYOVIEW LTD  06/2015   FALSE + GXT - Images Accurate:  TM - 6 min, 7 METs (93% MPHR) --> chest discomfort, notable ST depressions => Hight Risk Duke TM Score, but NO scintigraphic evidence of ischemia or infarction. 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  Normal   TUBAL LIGATION     Family History  Problem Relation Age of Onset   Diabetes Mother    Heart disease Mother    Hyperlipidemia Mother    Diabetes Sister    Hyperlipidemia Sister    Hypertension Sister    Diabetes Brother    Hyperlipidemia Brother    Prostate cancer Son        prostate   Hyperlipidemia Sister    Social History   Socioeconomic History   Marital status: Married    Spouse name: Not on file   Number of children: 3   Years of education: Not on file   Highest education level: Some college, no degree  Occupational History   Occupation: retired  Tobacco Use   Smoking status: Former  Packs/day: 0.50    Years: 20.00    Additional pack years: 0.00    Total pack years: 10.00    Types: Cigarettes    Quit date: 07/13/1981    Years since quitting: 41.3   Smokeless tobacco: Never  Vaping Use   Vaping Use: Never used  Substance and Sexual Activity   Alcohol use: No   Drug use: No   Sexual activity: Yes  Other Topics Concern   Not on file  Social History Narrative   SHE IS MARRIED ,MOTHER OF 4, GRANDMOTHER OF 6. SHE DOES EXERCISE BUT SOMEWHAT LIMITED BECAUSE OF HER KNEE (S/P SURGERY 2015).   SHE QUIT SMOKING IN 1980 AND SHE DOES NOT DRINK   Social Determinants of Health   Financial Resource Strain: Low Risk  (10/21/2022)   Overall Financial Resource Strain (CARDIA)    Difficulty of Paying Living Expenses: Not hard at all  Food Insecurity: No Food Insecurity (10/21/2022)   Hunger Vital Sign    Worried About Running Out of Food in the Last Year: Never true    Ran Out of Food in the Last Year: Never true  Transportation Needs: No Transportation Needs (10/21/2022)   PRAPARE - Administrator, Civil Service (Medical): No    Lack of Transportation (Non-Medical): No  Physical Activity: Patient Declined (10/21/2022)   Exercise Vital Sign    Days of Exercise per Week: Patient declined    Minutes of Exercise per Session: Patient  declined  Recent Concern: Physical Activity - Inactive (08/24/2022)   Exercise Vital Sign    Days of Exercise per Week: 0 days    Minutes of Exercise per Session: 30 min  Stress: No Stress Concern Present (10/21/2022)   [employer] of Occupational Health - Occupational Stress Questionnaire    Feeling of Stress : Not at all  Social Connections: Socially Integrated (10/21/2022)   Social Connection and Isolation Panel [NHANES]    Frequency of Communication with Friends and Family: More than three times a week    Frequency of Social Gatherings with Friends and Family: More than three times a week    Attends Religious Services: More than 4 times per year    Active Member of [employer] or Organizations: Yes    Attends Engineer, structural: More than 4 times per year    Marital Status: Married    Tobacco Counseling Counseling given: Not Answered   Clinical Intake:  Pre-visit preparation completed: Yes  Pain : No/denies pain Pain Score: 0-No pain     BMI - recorded: 31.78 Nutritional Status: BMI > 30  Obese Nutritional Risks: None Diabetes: Yes CBG done?: No Did pt. bring in CBG monitor from home?: No  How often do you need to have someone help you when you read instructions, pamphlets, or other written materials from your doctor or pharmacy?: 1 - Never What is the last grade level you completed in school?: HSG, GTCC  Nutrition Risk Assessment:  Has the patient had any N/V/D within the last 2 months?  No  Does the patient have any non-healing wounds?  No  Has the patient had any unintentional weight loss or weight gain?  No   Diabetes:  Is the patient diabetic?  Yes  If diabetic, was a CBG obtained today?  No  Did the patient bring in their glucometer from home?  No  How often do you monitor your CBG's? 2 times daily.   Financial Strains and Diabetes Management:  Are you  having any financial strains with the device, your supplies or your medication? No .  Does  the patient want to be seen by Chronic Care Management for management of their diabetes?  No  Would the patient like to be referred to a Nutritionist or for Diabetic Management?  No   Diabetic Exams:  Diabetic Eye Exam: Overdue for diabetic eye exam. Pt has been advised about the importance in completing this exam. Patient advised to call and schedule an eye exam. Diabetic Foot Exam: Completed 02/15/2019; Overdue for diabetic foot exam.   Interpreter Needed?: No  Information entered by :: Nakeesha Bowler N. Delesia Martinek, LPN.   Activities of Daily Living    10/21/2022   10:36 AM 10/20/2022    5:40 PM  In your present state of health, do you have any difficulty performing the following activities:  Hearing? 0 0  Vision? 0 0  Difficulty concentrating or making decisions? 0 0  Walking or climbing stairs? 1 1  Dressing or bathing? 0 0  Doing errands, shopping? 0 0  Preparing Food and eating ? N N  Using the Toilet? N N  In the past six months, have you accidently leaked urine? N N  Do you have problems with loss of bowel control? N N  Managing your Medications? N N  Managing your Finances? N N  Housekeeping or managing your Housekeeping? N N    Patient Care Team: Georgina Quint, MD as PCP - General (Internal Medicine) Marykay Lex, MD as PCP - Cardiology (Cardiology) Marykay Lex, MD as Attending Physician (Cardiology) Bond, Doran Stabler, MD as Referring Physician (Ophthalmology) Haddix, Gillie Manners, MD as Consulting Physician (Orthopedic Surgery) Duke, Roe Rutherford, PA as Physician Assistant (Cardiology)  Indicate any recent Medical Services you may have received from other than [facility] providers in the past year (date may be approximate).     Assessment:   This is a routine wellness examination for Tynika.  Hearing/Vision screen Hearing Screening - Comments:: Patient denied any hearing difficulty.   No hearing aids.  Vision Screening - Comments:: Patient does wear  corrective lenses/contacts.  Eye exam done by: Reginia Naas, MD.   Dietary issues and exercise activities discussed: Current Exercise Habits: The patient does not participate in regular exercise at present, Exercise limited by: orthopedic condition(s)   Goals Addressed             This Visit's Progress    My goal for 2024 is to get back into the gym and maintain my health.       Diabetes: Goal Setting  The 7 areas that you can work on to improve your diabetes are:   Healthy Eating - Decrease portions, eat at regular times, add whole grains,  fruits and vegetables.  Being Active - Increase activity by walking, swimming or biking, take the  stairs, park farther away.  Monitoring - Check blood sugar, record results, keep a journal. Monitoring  can also include weight and blood pressure.  Taking Medicines - Take the right amount at the right time, learn how your  medicine works and what the side effects are.  Problem Solving - Take care of high and low blood sugars, sick days, know  who to call and when.  Reducing Risks - Stop smoking and start preventive care: get dilated eye  exams, foot care, flu and pneumonia shots, and dental care.  Healthy Coping - Know when to ask for help and who you can talk to when  you  feel stressed or overwhelmed.  Pick something important to you and break your larger goals down into small  steps that you can really achieve! Caring for diabetes takes time and commitment, but it's worth it! Keep in mind  that change takes time. Once you choose a goal, break down the steps you will  take this week to reach that goal. Think of these as experiments, or things you  will try to see if they work. Think about why and what you will do differently  next week if something didn't work. Be patient with yourself and take it one day at a time.      Depression Screen    10/21/2022   10:35 AM 08/26/2022    9:44 AM 02/26/2022   10:45 AM 10/13/2021    2:41 PM  10/13/2021    2:36 PM 08/25/2021   10:10 AM 02/25/2021    9:53 AM  PHQ 2/9 Scores  PHQ - 2 Score 0 0 0 0 0 0 0  PHQ- 9 Score 1          Fall Risk    10/21/2022   10:35 AM 10/20/2022    5:40 PM 08/26/2022    9:44 AM 02/26/2022   10:45 AM 10/13/2021    2:39 PM  Fall Risk   Falls in the past year? 0 0 0 0 0  Number falls in past yr: 0  0 0 0  Injury with Fall? 0  0 0 0  Risk for fall due to : No Fall Risks  No Fall Risks No Fall Risks   Follow up Falls prevention discussed  Falls evaluation completed Falls evaluation completed Falls evaluation completed    FALL RISK PREVENTION PERTAINING TO THE HOME:  Any stairs in or around the home? Yes  If so, are there any without handrails? No  Home free of loose throw rugs in walkways, pet beds, electrical cords, etc? Yes  Adequate lighting in your home to reduce risk of falls? Yes   ASSISTIVE DEVICES UTILIZED TO PREVENT FALLS:  Life alert? No  Use of a cane, walker or w/c? No  Grab bars in the bathroom? No  Shower chair or bench in shower? Yes  Elevated toilet seat or a handicapped toilet? Yes   TIMED UP AND GO:  Was the test performed? No . Telephonic Visit  Cognitive Function:        10/21/2022   10:39 AM 08/24/2017    2:29 PM  6CIT Screen  What Year? 0 points 0 points  What month? 0 points 0 points  What time? 0 points 0 points  Count back from 20 0 points 0 points  Months in reverse 0 points 0 points  Repeat phrase 0 points 2 points  Total Score 0 points 2 points    Immunizations Immunization History  Administered Date(s) Administered   Fluad Quad(high Dose 65+) 02/29/2020   Influenza Split 04/15/2011, 02/17/2012, 03/09/2013   Influenza, High Dose Seasonal PF 01/29/2016, 01/20/2017, 02/07/2018, 01/26/2019   Influenza-Unspecified 02/23/2014, 01/31/2015, 01/20/2017, 02/20/2021, 02/02/2022   Moderna Sars-Covid-2 Vaccination 02/14/2021   PFIZER(Purple Top)SARS-COV-2 Vaccination 06/26/2019, 07/17/2019, 03/27/2020    Pneumococcal Conjugate-13 11/27/2013   Pneumococcal Polysaccharide-23 10/27/2006   Tdap 04/19/2006, 04/28/2016   Zoster Recombinat (Shingrix) 10/31/2016, 01/20/2017   Zoster, Live 07/02/2008    TDAP status: Up to date  Flu Vaccine status: Up to date  Pneumococcal vaccine status: Up to date  Covid-19 vaccine status: Completed vaccines  Qualifies for Shingles Vaccine? Yes  Zostavax completed Yes   Shingrix Completed?: Yes  Screening Tests Health Maintenance  Topic Date Due   FOOT EXAM  02/15/2020   OPHTHALMOLOGY EXAM  04/30/2021   COVID-19 Vaccine (5 - 2023-24 season) 01/30/2022   INFLUENZA VACCINE  12/31/2022   HEMOGLOBIN A1C  02/26/2023   Diabetic kidney evaluation - eGFR measurement  08/26/2023   Diabetic kidney evaluation - Urine ACR  08/26/2023   Medicare Annual Wellness (AWV)  10/21/2023   DTaP/Tdap/Td (3 - Td or Tdap) 04/28/2026   Pneumonia Vaccine 88+ Years old  Completed   DEXA SCAN  Completed   Zoster Vaccines- Shingrix  Completed   HPV VACCINES  Aged Out    Health Maintenance  Health Maintenance Due  Topic Date Due   FOOT EXAM  02/15/2020   OPHTHALMOLOGY EXAM  04/30/2021   COVID-19 Vaccine (5 - 2023-24 season) 01/30/2022    Colorectal cancer screening: No longer required.   Mammogram status: No longer required due to age.  Bone Density status: Completed 03/17/2018. Results reflect: Bone density results: OSTEOPENIA. Repeat every 2-3 years.  Lung Cancer Screening: (Low Dose CT Chest recommended if Age 79-80 years, 30 pack-year currently smoking OR have quit w/in 15years.) does not qualify.   Lung Cancer Screening Referral: no  Additional Screening:  Hepatitis C Screening:  qualify; Completed: no  Vision Screening: Recommended annual ophthalmology exams for early detection of glaucoma and other disorders of the eye. Is the patient up to date with their annual eye exam?  No ; last seen 04/30/2020 Who is the provider or what is the name of the office  in which the patient attends annual eye exams? Last seen by Reginia Naas, MD. If pt is not established with a provider, would they like to be referred to a provider to establish care? No .   Dental Screening: Recommended annual dental exams for proper oral hygiene  Community Resource Referral / Chronic Care Management: CRR required this visit?  No   CCM required this visit?  No      Plan:     I have personally reviewed and noted the following in the patient's chart:   Medical and social history Use of alcohol, tobacco or illicit drugs  Current medications and supplements including opioid prescriptions. Patient is not currently taking opioid prescriptions. Functional ability and status Nutritional status Physical activity Advanced directives List of other physicians Hospitalizations, surgeries, and ER visits in previous 12 months Vitals Screenings to include cognitive, depression, and falls Referrals and appointments  In addition, I have reviewed and discussed with patient certain preventive protocols, quality metrics, and best practice recommendations. A written personalized care plan for preventive services as well as general preventive health recommendations were provided to patient.     Mickeal Needy, LPN   [id number]/40/9811   Nurse Notes:  Normal cognitive status assessed by direct observation via telephone conversation by this Nurse Health Advisor. No abnormalities found.   Patient has been added to retina scan exam for 12/16/2022 at [facility].

## 2022-10-21 NOTE — Patient Instructions (Signed)
Angela Chambers , Thank you for taking time to come for your Medicare Wellness Visit. I appreciate your ongoing commitment to your health goals. Please review the following plan we discussed and let me know if I can assist you in the future.   These are the goals we discussed:  Goals      My goal for 2024 is to get back into the gym and maintain my health.     Diabetes: Goal Setting  The 7 areas that you can work on to improve your diabetes are:   Healthy Eating - Decrease portions, eat at regular times, add whole grains,  fruits and vegetables.  Being Active - Increase activity by walking, swimming or biking, take the  stairs, park farther away.  Monitoring - Check blood sugar, record results, keep a journal. Monitoring  can also include weight and blood pressure.  Taking Medicines - Take the right amount at the right time, learn how your  medicine works and what the side effects are.  Problem Solving - Take care of high and low blood sugars, sick days, know  who to call and when.  Reducing Risks - Stop smoking and start preventive care: get dilated eye  exams, foot care, flu and pneumonia shots, and dental care.  Healthy Coping - Know when to ask for help and who you can talk to when  you feel stressed or overwhelmed.  Pick something important to you and break your larger goals down into small  steps that you can really achieve! Caring for diabetes takes time and commitment, but it's worth it! Keep in mind  that change takes time. Once you choose a goal, break down the steps you will  take this week to reach that goal. Think of these as experiments, or things you  will try to see if they work. Think about why and what you will do differently  next week if something didn't work. Be patient with yourself and take it one day at a time.        This is a list of the screening recommended for you and due dates:  Health Maintenance  Topic Date Due   Complete foot exam   02/15/2020    Eye exam for diabetics  04/30/2021   COVID-19 Vaccine (5 - 2023-24 season) 01/30/2022   Flu Shot  12/31/2022   Hemoglobin A1C  02/26/2023   Yearly kidney function blood test for diabetes  08/26/2023   Yearly kidney health urinalysis for diabetes  08/26/2023   Medicare Annual Wellness Visit  10/21/2023   DTaP/Tdap/Td vaccine (3 - Td or Tdap) 04/28/2026   Pneumonia Vaccine  Completed   DEXA scan (bone density measurement)  Completed   Zoster (Shingles) Vaccine  Completed   HPV Vaccine  Aged Out    Advanced directives: Yes; Patient has a Living Will; Please bring a copy of your health care power of attorney and living will to the office at your convenience.  Conditions/risks identified: Yes; Type II Diabetes  Next appointment: Follow up in one year for your annual wellness visit on 10/26/2023 at 10:45 a.m. for phone visit with Nurse Percell Miller.   Preventive Care 82 Years and Older, Female Preventive care refers to lifestyle choices and visits with your health care provider that can promote health and wellness. What does preventive care include? A yearly physical exam. This is also called an annual well check. Dental exams once or twice a year. Routine eye exams. Ask your health care provider  how often you should have your eyes checked. Personal lifestyle choices, including: Daily care of your teeth and gums. Regular physical activity. Eating a healthy diet. Avoiding tobacco and drug use. Limiting alcohol use. Practicing safe sex. Taking low-dose aspirin every day. Taking vitamin and mineral supplements as recommended by your health care provider. What happens during an annual well check? The services and screenings done by your health care provider during your annual well check will depend on your age, overall health, lifestyle risk factors, and family history of disease. Counseling  Your health care provider may ask you questions about your: Alcohol use. Tobacco use. Drug  use. Emotional well-being. Home and relationship well-being. Sexual activity. Eating habits. History of falls. Memory and ability to understand (cognition). Work and work Astronomer. Reproductive health. Screening  You may have the following tests or measurements: Height, weight, and BMI. Blood pressure. Lipid and cholesterol levels. These may be checked every 5 years, or more frequently if you are over 35 years old. Skin check. Lung cancer screening. You may have this screening every year starting at age 84 if you have a 30-pack-year history of smoking and currently smoke or have quit within the past 15 years. Fecal occult blood test (FOBT) of the stool. You may have this test every year starting at age 53. Flexible sigmoidoscopy or colonoscopy. You may have a sigmoidoscopy every 5 years or a colonoscopy every 10 years starting at age 82. Hepatitis C blood test. Hepatitis B blood test. Sexually transmitted disease (STD) testing. Diabetes screening. This is done by checking your blood sugar (glucose) after you have not eaten for a while (fasting). You may have this done every 1-3 years. Bone density scan. This is done to screen for osteoporosis. You may have this done starting at age 26. Mammogram. This may be done every 1-2 years. Talk to your health care provider about how often you should have regular mammograms. Talk with your health care provider about your test results, treatment options, and if necessary, the need for more tests. Vaccines  Your health care provider may recommend certain vaccines, such as: Influenza vaccine. This is recommended every year. Tetanus, diphtheria, and acellular pertussis (Tdap, Td) vaccine. You may need a Td booster every 10 years. Zoster vaccine. You may need this after age 55. Pneumococcal 13-valent conjugate (PCV13) vaccine. One dose is recommended after age 43. Pneumococcal polysaccharide (PPSV23) vaccine. One dose is recommended after age  27. Talk to your health care provider about which screenings and vaccines you need and how often you need them. This information is not intended to replace advice given to you by your health care provider. Make sure you discuss any questions you have with your health care provider. Document Released: 06/14/2015 Document Revised: 02/05/2016 Document Reviewed: 03/19/2015 Elsevier Interactive Patient Education  2017 [employer].  Fall Prevention in the Home Falls can cause injuries. They can happen to people of all ages. There are many things you can do to make your home safe and to help prevent falls. What can I do on the outside of my home? Regularly fix the edges of walkways and driveways and fix any cracks. Remove anything that might make you trip as you walk through a door, such as a raised step or threshold. Trim any bushes or trees on the path to your home. Use bright outdoor lighting. Clear any walking paths of anything that might make someone trip, such as rocks or tools. Regularly check to see if handrails are loose  or broken. Make sure that both sides of any steps have handrails. Any raised decks and porches should have guardrails on the edges. Have any leaves, snow, or ice cleared regularly. Use sand or salt on walking paths during winter. Clean up any spills in your garage right away. This includes oil or grease spills. What can I do in the bathroom? Use night lights. Install grab bars by the toilet and in the tub and shower. Do not use towel bars as grab bars. Use non-skid mats or decals in the tub or shower. If you need to sit down in the shower, use a plastic, non-slip stool. Keep the floor dry. Clean up any water that spills on the floor as soon as it happens. Remove soap buildup in the tub or shower regularly. Attach bath mats securely with double-sided non-slip rug tape. Do not have throw rugs and other things on the floor that can make you trip. What can I do in the  bedroom? Use night lights. Make sure that you have a light by your bed that is easy to reach. Do not use any sheets or blankets that are too big for your bed. They should not hang down onto the floor. Have a firm chair that has side arms. You can use this for support while you get dressed. Do not have throw rugs and other things on the floor that can make you trip. What can I do in the kitchen? Clean up any spills right away. Avoid walking on wet floors. Keep items that you use a lot in easy-to-reach places. If you need to reach something above you, use a strong step stool that has a grab bar. Keep electrical cords out of the way. Do not use floor polish or wax that makes floors slippery. If you must use wax, use non-skid floor wax. Do not have throw rugs and other things on the floor that can make you trip. What can I do with my stairs? Do not leave any items on the stairs. Make sure that there are handrails on both sides of the stairs and use them. Fix handrails that are broken or loose. Make sure that handrails are as long as the stairways. Check any carpeting to make sure that it is firmly attached to the stairs. Fix any carpet that is loose or worn. Avoid having throw rugs at the top or bottom of the stairs. If you do have throw rugs, attach them to the floor with carpet tape. Make sure that you have a light switch at the top of the stairs and the bottom of the stairs. If you do not have them, ask someone to add them for you. What else can I do to help prevent falls? Wear shoes that: Do not have high heels. Have rubber bottoms. Are comfortable and fit you well. Are closed at the toe. Do not wear sandals. If you use a stepladder: Make sure that it is fully opened. Do not climb a closed stepladder. Make sure that both sides of the stepladder are locked into place. Ask someone to hold it for you, if possible. Clearly mark and make sure that you can see: Any grab bars or  handrails. First and last steps. Where the edge of each step is. Use tools that help you move around (mobility aids) if they are needed. These include: Canes. Walkers. Scooters. Crutches. Turn on the lights when you go into a dark area. Replace any light bulbs as soon as they burn out.  Set up your furniture so you have a clear path. Avoid moving your furniture around. If any of your floors are uneven, fix them. If there are any pets around you, be aware of where they are. Review your medicines with your doctor. Some medicines can make you feel dizzy. This can increase your chance of falling. Ask your doctor what other things that you can do to help prevent falls. This information is not intended to replace advice given to you by your health care provider. Make sure you discuss any questions you have with your health care provider. Document Released: 03/14/2009 Document Revised: 10/24/2015 Document Reviewed: 06/22/2014 Elsevier Interactive Patient Education  2017 [employer].

## 2022-12-25 ENCOUNTER — Other Ambulatory Visit: Payer: Self-pay | Admitting: Emergency Medicine

## 2022-12-25 DIAGNOSIS — E1165 Type 2 diabetes mellitus with hyperglycemia: Secondary | ICD-10-CM

## 2022-12-29 ENCOUNTER — Other Ambulatory Visit: Payer: Self-pay

## 2022-12-29 DIAGNOSIS — I119 Hypertensive heart disease without heart failure: Secondary | ICD-10-CM

## 2022-12-29 MED ORDER — METOPROLOL SUCCINATE ER 50 MG PO TB24: ORAL_TABLET | ORAL | 3 refills | Status: DC

## 2022-12-29 MED ORDER — HYDROCHLOROTHIAZIDE 25 MG PO TABS
ORAL_TABLET | ORAL | 3 refills | Status: AC
Start: 2022-12-29 — End: ?

## 2022-12-30 ENCOUNTER — Encounter: Payer: Self-pay | Admitting: Emergency Medicine

## 2022-12-30 ENCOUNTER — Ambulatory Visit: Payer: Medicare Other | Admitting: Emergency Medicine

## 2022-12-30 VITALS — BP 128/74 | HR 71 | Temp 98.4°F | Ht 65.0 in | Wt 183.4 lb

## 2022-12-30 DIAGNOSIS — A09 Infectious gastroenteritis and colitis, unspecified: Secondary | ICD-10-CM | POA: Diagnosis not present

## 2022-12-30 DIAGNOSIS — R195 Other fecal abnormalities: Secondary | ICD-10-CM | POA: Insufficient documentation

## 2022-12-30 LAB — COMPREHENSIVE METABOLIC PANEL
ALT: 14 U/L (ref 0–35)
AST: 15 U/L (ref 0–37)
Albumin: 4.3 g/dL (ref 3.5–5.2)
Alkaline Phosphatase: 90 U/L (ref 39–117)
BUN: 26 mg/dL — ABNORMAL HIGH (ref 6–23)
CO2: 30 mEq/L (ref 19–32)
Calcium: 10.2 mg/dL (ref 8.4–10.5)
Chloride: 103 mEq/L (ref 96–112)
Creatinine, Ser: 1.06 mg/dL (ref 0.40–1.20)
GFR: 48.94 mL/min — ABNORMAL LOW (ref >60.00)
Glucose, Bld: 181 mg/dL — ABNORMAL HIGH (ref 70–99)
Potassium: 4.2 mEq/L (ref 3.5–5.1)
Sodium: 142 mEq/L (ref 135–145)
Total Bilirubin: 0.8 mg/dL (ref 0.2–1.2)
Total Protein: 7.2 g/dL (ref 6.0–8.3)

## 2022-12-30 LAB — CBC WITH DIFFERENTIAL/PLATELET
Basophils Absolute: 0.1 10*3/uL (ref 0.0–0.1)
Basophils Relative: 1.1 % (ref 0.0–3.0)
Eosinophils Absolute: 0.1 10*3/uL (ref 0.0–0.7)
Eosinophils Relative: 2.4 % (ref 0.0–5.0)
HCT: 38.2 % (ref 36.0–46.0)
Hemoglobin: 12.9 g/dL (ref 12.0–15.0)
Lymphocytes Relative: 33.1 % (ref 12.0–46.0)
Lymphs Abs: 1.9 10*3/uL (ref 0.7–4.0)
MCHC: 33.7 g/dL (ref 30.0–36.0)
MCV: 87.1 fl (ref 78.0–100.0)
Monocytes Absolute: 0.5 10*3/uL (ref 0.1–1.0)
Monocytes Relative: 9.3 % (ref 3.0–12.0)
Neutro Abs: 3.1 10*3/uL (ref 1.4–7.7)
Neutrophils Relative %: 54.1 % (ref 43.0–77.0)
Platelets: 321 10*3/uL (ref 150.0–400.0)
RBC: 4.38 Mil/uL (ref 3.87–5.11)
RDW: 14.7 % (ref 11.5–15.5)
WBC: 5.7 10*3/uL (ref 4.0–10.5)

## 2022-12-30 LAB — HEMOCCULT GUIAC POC 1CARD (OFFICE): Fecal Occult Blood, POC: NEGATIVE

## 2022-12-30 NOTE — Assessment & Plan Note (Signed)
Brown stools.  Guaiac negative. Benign rectal and abdominal examinations No concerns.

## 2022-12-30 NOTE — Progress Notes (Signed)
Angela Chambers 82 y.o.   Chief Complaint  Patient presents with   Stool Color Change    Patient states she is having some dark stools, no blood in toilet or when she wipes  x 1 week   Patient states she had a virus and she started having diarrhea and she was taking iron pills and was taking pepto for her stomach .     HISTORY OF PRESENT ILLNESS: This is a 82 y.o. female complaining of intestinal infection that started about 1 week ago.  Slowly getting better. Noticed stools were dark.  Did take Pepto-Bismol 1 week ago. No diarrhea.  Still eating and drinking.  Denies nausea and vomiting.  Denies fever or chills No other significant symptomatology No other complaints or medical concerns today.  HPI   Prior to Admission medications   Medication Sig Start Date End Date Taking? Authorizing Provider  acetaminophen (TYLENOL) 500 MG tablet Take 2 tablets (1,000 mg total) by mouth every 8 (eight) hours. 05/14/20  Yes Almon Hercules, MD  amLODipine (NORVASC) 5 MG tablet TAKE 1 TABLET(5 MG) BY MOUTH DAILY 07/14/22  Yes Duke, Roe Rutherford, PA  aspirin 81 MG tablet Take 81 mg by mouth daily.   Yes [provider]  atorvastatin (LIPITOR) 20 MG tablet TAKE 1 TABLET(20 MG) BY MOUTH DAILY 10/09/22  Yes Marykay Lex, MD  Blood Glucose Monitoring Suppl (BLOOD GLUCOSE MONITOR SYSTEM) w/Device KIT 1 Units by Does not apply route 2 (two) times daily. 02/07/18  Yes Barnett Abu, [place] D, PA-C  brimonidine (ALPHAGAN) 0.2 % ophthalmic solution Place 1 drop into both eyes 2 (two) times daily. 05/06/20  Yes [provider]  Co-Enzyme Q-10 30 MG CAPS Take 30 mg by mouth daily.   Yes [provider]  dorzolamide-timolol (COSOPT) 22.3-6.8 MG/ML ophthalmic solution Place 1 drop into both eyes 2 (two) times daily. 09/23/17  Yes [provider]  glipiZIDE (GLUCOTROL) 5 MG tablet Take 1 tablet (5 mg total) by mouth 2 (two) times daily before a meal. 08/26/22 08/21/23 Yes Shanna Strength,  Eilleen Kempf, MD  hydrochlorothiazide (HYDRODIURIL) 25 MG tablet TAKE 1 TABLET(25 MG) BY MOUTH DAILY 12/29/22  Yes Duke, Roe Rutherford, PA  iron polysaccharides (NIFEREX) 150 MG capsule Take 1 capsule (150 mg total) by mouth daily. 08/25/21  Yes Aiza Vollrath, Eilleen Kempf, MD  latanoprost (XALATAN) 0.005 % ophthalmic solution Place 1 drop into both eyes at bedtime. 09/21/18  Yes [provider]  losartan (COZAAR) 50 MG tablet Take 50 mg by mouth daily.   Yes [provider]  metFORMIN (GLUCOPHAGE) 500 MG tablet TAKE 1 TABLET(500 MG) BY MOUTH TWICE DAILY WITH A MEAL 12/25/22  Yes Verdia Bolt, Eilleen Kempf, MD  metoprolol succinate (TOPROL-XL) 50 MG 24 hr tablet TAKE 1 TABLET BY MOUTH DAILY IMMEDIATELY FOLLOWING A MEAL 12/29/22  Yes Duke, Roe Rutherford, PA  Multiple Vitamin (MULTIVITAMIN) tablet Take 1 tablet by mouth daily.   Yes [provider]    Allergies  Allergen Reactions   Lisinopril Cough   Metformin And Related     Diarrhea on 1000mg  BID, tolerates 500 mg BID    Patient Active Problem List   Diagnosis Date Noted   Hypertension associated with diabetes (HCC) 08/16/2018   Abnormal stress echo EKG portion with normal echo -> imaging confirmed the correct with normal coronaries 06/21/2015   OA (osteoarthritis) of knee 09/25/2013   Obesity (BMI 30.0-34.9)    Hypertensive heart disease without CHF 11/30/2011   Hyperlipidemia 08/25/2011  Other and combined forms of senile cataract 07/04/2011   Primary open angle glaucoma of both eyes, severe stage 07/04/2011   Dyslipidemia associated with type 2 diabetes mellitus (HCC) 02/22/2009   PERSONAL HISTORY OF COLONIC POLYPS 02/22/2009    Past Medical History:  Diagnosis Date   Arthritis    Cataract    Diabetes mellitus    Diabetes mellitus without complication (HCC)    Phreesia 05/15/2020   Fibroids    Glaucoma    Glaucoma    Phreesia 05/15/2020   Hyperlipidemia    Hypertension 07 /2013    WITH EF GREATER THAN 55 %  WITH MILD MR BUT NO PROLASPE ,ESSENTIALLY NORMAL DONE TO EVALUATE PALPITATIONS .   Obesity (BMI 30.0-34.9)    Osteoporosis    Palpitations    CONTROLLED ON BETA BLOCKER   Symptomatic anemia post operative blood loss 05/12/2020    Past Surgical History:  Procedure Laterality Date   ABDOMINAL HYSTERECTOMY  10/18/ 1982   TAH-BSO for fibroids and adenomatous hyperplasia   CARDIAC CATHETERIZATION  08/2009   NONOBSTRUCTIVE CAD WITH 20% IN THE LAD AND 20 % IN THE RCA NORMAL EF 65%   CARDIAC CATHETERIZATION N/A 07/05/2015   Procedure: Left Heart Cath and Coronary Angiography;  Surgeon: Marykay Lex, MD;  Location: [facility] INVASIVE CV LAB;  Service: Cardiovascular: Angiographically minimal CAD. EF 60-65%   CPET-MET Test (Cardiopulmonary Exercise Test)  03/06/2013   Adequate effort at 1.11. Peak VO2 12.3 is 86% in the normal range. Heart rate was just outside the normal range target being 127 beats a minute but this was 86% of projected. She did have an ischemic pattern after the anterograde threshold with increased heart rate and blunting of stroke volume during the last 3 minutes of exercise, but was asymptomatic.   JOINT REPLACEMENT  09/05/2009   L knee   KNEE SURGERY  1984   per pt screws in R knee   NM MYOVIEW LTD  06/2015   FALSE + GXT - Images Accurate:  TM - 6 min, 7 METs (93% MPHR) --> chest discomfort, notable ST depressions => Hight Risk Duke TM Score, but NO scintigraphic evidence of ischemia or infarction. EF 74$   ORIF FEMUR FRACTURE Right 05/10/2020   Procedure: OPEN REDUCTION INTERNAL FIXATION (ORIF) DISTAL FEMUR FRACTURE;  Surgeon: Roby Lofts, MD;  Location: [facility] OR;  Service: Orthopedics;  Laterality: Right;   TOTAL KNEE ARTHROPLASTY Right 09/25/2013   Procedure: RIGHT TOTAL KNEE ARTHROPLASTY;  Surgeon: Loanne Drilling, MD;  Location: [facility] ORS;  Service: Orthopedics;  Laterality: Right;   TRANSTHORACIC ECHOCARDIOGRAM  July 2013   EF greater than 55%, mild MR no prolapse.  Normal    TUBAL LIGATION      Social History   Socioeconomic History   Marital status: Married    Spouse name: Not on file   Number of children: 3   Years of education: Not on file   Highest education level: Some college, no degree  Occupational History   Occupation: retired  Tobacco Use   Smoking status: Former    Current packs/day: 0.00    Average packs/day: 0.5 packs/day for 20.0 years (10.0 ttl pk-yrs)    Types: Cigarettes    Start date: 07/13/1961    Quit date: 07/13/1981    Years since quitting: 41.4   Smokeless tobacco: Never  Vaping Use   Vaping status: Never Used  Substance and Sexual Activity   Alcohol use: No   Drug use: No  Sexual activity: Yes  Other Topics Concern   Not on file  Social History Narrative   SHE IS MARRIED ,MOTHER OF 4, GRANDMOTHER OF 6. SHE DOES EXERCISE BUT SOMEWHAT LIMITED BECAUSE OF HER KNEE (S/P SURGERY 2015).   SHE QUIT SMOKING IN 1980 AND SHE DOES NOT DRINK   Social Determinants of Health   Financial Resource Strain: Low Risk  (10/21/2022)   Overall Financial Resource Strain (CARDIA)    Difficulty of Paying Living Expenses: Not hard at all  Food Insecurity: No Food Insecurity (10/21/2022)   Hunger Vital Sign    Worried About Running Out of Food in the Last Year: Never true    Ran Out of Food in the Last Year: Never true  Transportation Needs: No Transportation Needs (10/21/2022)   PRAPARE - Administrator, Civil Service (Medical): No    Lack of Transportation (Non-Medical): No  Physical Activity: Patient Declined (10/21/2022)   Exercise Vital Sign    Days of Exercise per Week: Patient declined    Minutes of Exercise per Session: Patient declined  Recent Concern: Physical Activity - Inactive (08/24/2022)   Exercise Vital Sign    Days of Exercise per Week: 0 days    Minutes of Exercise per Session: 30 min  Stress: No Stress Concern Present (10/21/2022)   [employer] of Occupational Health - Occupational Stress Questionnaire     Feeling of Stress : Not at all  Social Connections: Socially Integrated (10/21/2022)   Social Connection and Isolation Panel [NHANES]    Frequency of Communication with Friends and Family: More than three times a week    Frequency of Social Gatherings with Friends and Family: More than three times a week    Attends Religious Services: More than 4 times per year    Active Member of [employer] or Organizations: Yes    Attends Engineer, structural: More than 4 times per year    Marital Status: Married  Catering manager Violence: Not At Risk (10/21/2022)   Humiliation, Afraid, Rape, and Kick questionnaire    Fear of Current or Ex-Partner: No    Emotionally Abused: No    Physically Abused: No    Sexually Abused: No    Family History  Problem Relation Age of Onset   Diabetes Mother    Heart disease Mother    Hyperlipidemia Mother    Diabetes Sister    Hyperlipidemia Sister    Hypertension Sister    Diabetes Brother    Hyperlipidemia Brother    Prostate cancer Son        prostate   Hyperlipidemia Sister      Review of Systems  Constitutional: Negative.  Negative for chills and fever.  HENT: Negative.  Negative for congestion and sore throat.   Respiratory: Negative.  Negative for cough and shortness of breath.   Cardiovascular: Negative.  Negative for chest pain and palpitations.  Gastrointestinal:  Positive for abdominal pain. Negative for blood in stool, constipation, diarrhea, melena, nausea and vomiting.  Genitourinary: Negative.  Negative for dysuria.  Skin: Negative.  Negative for rash.  Neurological: Negative.  Negative for dizziness and headaches.  All other systems reviewed and are negative.   Vitals:   12/30/22 1411  BP: 128/74  Pulse: 71  Temp: 98.4 F (36.9 C)  SpO2: 95%    Physical Exam Vitals reviewed.  Constitutional:      Appearance: Normal appearance.  HENT:     Head: Normocephalic.     Mouth/Throat:  Mouth: Mucous membranes are moist.      Pharynx: Oropharynx is clear.  Eyes:     Extraocular Movements: Extraocular movements intact.     Conjunctiva/sclera: Conjunctivae normal.     Pupils: Pupils are equal, round, and reactive to light.  Cardiovascular:     Rate and Rhythm: Normal rate and regular rhythm.     Pulses: Normal pulses.     Heart sounds: Normal heart sounds.  Pulmonary:     Effort: Pulmonary effort is normal.     Breath sounds: Normal breath sounds.  Abdominal:     Palpations: Abdomen is soft.     Tenderness: There is no abdominal tenderness.     Comments: Guaiac negative stools Normal rectal exam  Genitourinary:    Rectum: Normal. Guaiac result negative. No mass or tenderness. Normal anal tone.  Musculoskeletal:     Cervical back: No tenderness.  Lymphadenopathy:     Cervical: No cervical adenopathy.  Skin:    General: Skin is warm and dry.  Neurological:     Mental Status: She is alert and oriented to person, place, and time.  Psychiatric:        Mood and Affect: Mood normal.        Behavior: Behavior normal.    Results for orders placed or performed in visit on 12/30/22 (from the past 24 hour(s))  POCT Occult Blood Stool     Status: Normal   Collection Time: 12/30/22  2:34 PM  Result Value Ref Range   Fecal Occult Blood, POC Negative Negative   Card #1 Date     Card #2 Fecal Occult Blod, POC     Card #2 Date     Card #3 Fecal Occult Blood, POC     Card #3 Date    CBC with Differential/Platelet     Status: None   Collection Time: 12/30/22  2:41 PM  Result Value Ref Range   WBC 5.7 4.0 - 10.5 K/uL   RBC 4.38 3.87 - 5.11 Mil/uL   Hemoglobin 12.9 12.0 - 15.0 g/dL   HCT 40.9 81.1 - 91.4 %   MCV 87.1 78.0 - 100.0 fl   MCHC 33.7 30.0 - 36.0 g/dL   RDW 78.2 95.6 - 21.3 %   Platelets 321.0 150.0 - 400.0 K/uL   Neutrophils Relative % 54.1 43.0 - 77.0 %   Lymphocytes Relative 33.1 12.0 - 46.0 %   Monocytes Relative 9.3 3.0 - 12.0 %   Eosinophils Relative 2.4 0.0 - 5.0 %   Basophils Relative  1.1 0.0 - 3.0 %   Neutro Abs 3.1 1.4 - 7.7 K/uL   Lymphs Abs 1.9 0.7 - 4.0 K/uL   Monocytes Absolute 0.5 0.1 - 1.0 K/uL   Eosinophils Absolute 0.1 0.0 - 0.7 K/uL   Basophils Absolute 0.1 0.0 - 0.1 K/uL     ASSESSMENT & PLAN: A total of 45 minutes was spent with the patient and counseling/coordination of care regarding preparing for this visit, review of most recent office visit notes, review of multiple chronic medical conditions under management, review of all medications, diagnosis of infectious gastroenteritis management, prognosis, ED precautions, documentation and need for follow-up.  Problem List Items Addressed This Visit       Digestive   Infectious gastroenteritis - Primary    Clinically stable.  Running its course without complications Guaiac-negative stools.  No lower GI bleed.  No red flag signs or symptoms. Benign abdominal examination Blood work done today  Relevant Orders   CBC with Differential/Platelet   Comprehensive metabolic panel     Other   Dark stools    Brown stools.  Guaiac negative. Benign rectal and abdominal examinations No concerns.      Relevant Orders   POCT Occult Blood Stool (Completed)   CBC with Differential/Platelet   Comprehensive metabolic panel   Patient Instructions  Viral Gastroenteritis, Adult  Viral gastroenteritis is also known as the stomach flu. This condition may affect your stomach, your small intestine, and your large intestine. It can cause sudden watery poop (diarrhea), fever, and vomiting. This condition is caused by certain germs (viruses). These germs can be passed from person to person very easily (are contagious). Having watery poop and vomiting can make you feel weak and cause you to not have enough water in your body (get dehydrated). This can make you tired and thirsty, make you have a dry mouth, and make it so you pee (urinate) less often. It is important to replace the fluids that you lose from having watery  poop and vomiting. What are the causes? You can get sick by catching germs from other people. You can also get sick by: Eating food, drinking water, or touching a surface that has the germs on it (is contaminated). Sharing utensils or other personal items with a person who is sick. What increases the risk? Having a weak body defense system (immune system). Living with one or more children who are younger than 2 years. Living in a nursing home. Going on cruise ships. What are the signs or symptoms? Symptoms of this condition start suddenly. Symptoms may last for a few days or for as long as a week. Common symptoms include: Watery poop. Vomiting. Other symptoms include: Fever. Headache. Feeling tired (fatigue). Pain in the belly (abdomen). Chills. Feeling weak. Feeling like you may vomit (nauseous). Muscle aches. Not feeling hungry. How is this treated? This condition typically goes away on its own. The focus of treatment is to replace the fluids that you lose. This condition may be treated with: An ORS (oral rehydration solution). This is a drink that helps you replace fluids and minerals your body lost. It is sold at pharmacies and stores. Medicines to help with your symptoms. Probiotic supplements to reduce symptoms of watery poop. Fluids given through an IV tube, if needed. Older adults and people with other diseases or a weak body defense system are at higher risk for not having enough water in the body. Follow these instructions at home: Eating and drinking  Take an ORS as told by your doctor. Drink clear fluids in small amounts as you are able. Clear fluids include: Water. Ice chips. Fruit juice that has water added to it (is diluted). Low-calorie sports drinks. Drink enough fluid to keep your pee (urine) pale yellow. Eat small amounts of healthy foods every 3-4 hours as you are able. This may include whole grains, fruits, vegetables, lean meats, and yogurt. Avoid  fluids that have a lot of sugar or caffeine in them. This includes energy drinks, sports drinks, and soda. Avoid spicy or fatty foods. Avoid alcohol. General instructions  Wash your hands often. This is very important after you have watery poop or you vomit. If you cannot use soap and water, use hand sanitizer. Make sure that all people in your home wash their hands well and often. Take over-the-counter and prescription medicines only as told by your doctor. Rest at home while you get better. Watch your condition for  any changes. Take a warm bath to help with any burning or pain from having watery poop. Keep all follow-up visits. Contact a doctor if: You cannot keep fluids down. Your symptoms get worse. You have new symptoms. You feel light-headed or dizzy. You have muscle cramps. Get help right away if: You have chest pain. You have trouble breathing, or you are breathing very fast. You have a fast heartbeat. You feel very weak or you faint. You have a very bad headache, a stiff neck, or both. You have a rash. You have very bad pain, cramping, or bloating in your belly. Your skin feels cold and clammy. You feel mixed up (confused). You have pain when you pee. You have signs of not having enough water in the body, such as: Dark pee, hardly any pee, or no pee. Cracked lips. Dry mouth. Sunken eyes. Feeling very sleepy. Feeling weak. You have signs of bleeding, such as: You see blood in your vomit. Your vomit looks like coffee grounds. You have bloody or black poop or poop that looks like tar. These symptoms may be an emergency. Get help right away. Call 911. Do not wait to see if the symptoms will go away. Do not drive yourself to the hospital. Summary Viral gastroenteritis is also known as the stomach flu. This condition can cause sudden watery poop (diarrhea), fever, and vomiting. These germs can be passed from person to person very easily. Take an ORS (oral rehydration  solution) as told by your doctor. This is a drink that is sold at pharmacies and stores. Wash your hands often, especially after having watery poop or vomiting. If you cannot use soap and water, use hand sanitizer. This information is not intended to replace advice given to you by your health care provider. Make sure you discuss any questions you have with your health care provider. Document Revised: 03/17/2021 Document Reviewed: 03/17/2021 Elsevier Patient Education  2024 Elsevier Inc.      Edwina Barth, MD [facility]

## 2022-12-30 NOTE — Patient Instructions (Signed)
Viral Gastroenteritis, Adult  Viral gastroenteritis is also known as the stomach flu. This condition may affect your stomach, your small intestine, and your large intestine. It can cause sudden watery poop (diarrhea), fever, and vomiting. This condition is caused by certain germs (viruses). These germs can be passed from person to person very easily (are contagious). Having watery poop and vomiting can make you feel weak and cause you to not have enough water in your body (get dehydrated). This can make you tired and thirsty, make you have a dry mouth, and make it so you pee (urinate) less often. It is important to replace the fluids that you lose from having watery poop and vomiting. What are the causes? You can get sick by catching germs from other people. You can also get sick by: Eating food, drinking water, or touching a surface that has the germs on it (is contaminated). Sharing utensils or other personal items with a person who is sick. What increases the risk? Having a weak body defense system (immune system). Living with one or more children who are younger than 2 years. Living in a nursing home. Going on cruise ships. What are the signs or symptoms? Symptoms of this condition start suddenly. Symptoms may last for a few days or for as long as a week. Common symptoms include: Watery poop. Vomiting. Other symptoms include: Fever. Headache. Feeling tired (fatigue). Pain in the belly (abdomen). Chills. Feeling weak. Feeling like you may vomit (nauseous). Muscle aches. Not feeling hungry. How is this treated? This condition typically goes away on its own. The focus of treatment is to replace the fluids that you lose. This condition may be treated with: An ORS (oral rehydration solution). This is a drink that helps you replace fluids and minerals your body lost. It is sold at pharmacies and stores. Medicines to help with your symptoms. Probiotic supplements to reduce symptoms of  watery poop. Fluids given through an IV tube, if needed. Older adults and people with other diseases or a weak body defense system are at higher risk for not having enough water in the body. Follow these instructions at home: Eating and drinking  Take an ORS as told by your doctor. Drink clear fluids in small amounts as you are able. Clear fluids include: Water. Ice chips. Fruit juice that has water added to it (is diluted). Low-calorie sports drinks. Drink enough fluid to keep your pee (urine) pale yellow. Eat small amounts of healthy foods every 3-4 hours as you are able. This may include whole grains, fruits, vegetables, lean meats, and yogurt. Avoid fluids that have a lot of sugar or caffeine in them. This includes energy drinks, sports drinks, and soda. Avoid spicy or fatty foods. Avoid alcohol. General instructions  Wash your hands often. This is very important after you have watery poop or you vomit. If you cannot use soap and water, use hand sanitizer. Make sure that all people in your home wash their hands well and often. Take over-the-counter and prescription medicines only as told by your doctor. Rest at home while you get better. Watch your condition for any changes. Take a warm bath to help with any burning or pain from having watery poop. Keep all follow-up visits. Contact a doctor if: You cannot keep fluids down. Your symptoms get worse. You have new symptoms. You feel light-headed or dizzy. You have muscle cramps. Get help right away if: You have chest pain. You have trouble breathing, or you are breathing very fast.  You have a fast heartbeat. You feel very weak or you faint. You have a very bad headache, a stiff neck, or both. You have a rash. You have very bad pain, cramping, or bloating in your belly. Your skin feels cold and clammy. You feel mixed up (confused). You have pain when you pee. You have signs of not having enough water in the body, such  as: Dark pee, hardly any pee, or no pee. Cracked lips. Dry mouth. Sunken eyes. Feeling very sleepy. Feeling weak. You have signs of bleeding, such as: You see blood in your vomit. Your vomit looks like coffee grounds. You have bloody or black poop or poop that looks like tar. These symptoms may be an emergency. Get help right away. Call 911. Do not wait to see if the symptoms will go away. Do not drive yourself to the hospital. Summary Viral gastroenteritis is also known as the stomach flu. This condition can cause sudden watery poop (diarrhea), fever, and vomiting. These germs can be passed from person to person very easily. Take an ORS (oral rehydration solution) as told by your doctor. This is a drink that is sold at pharmacies and stores. Wash your hands often, especially after having watery poop or vomiting. If you cannot use soap and water, use hand sanitizer. This information is not intended to replace advice given to you by your health care provider. Make sure you discuss any questions you have with your health care provider. Document Revised: 03/17/2021 Document Reviewed: 03/17/2021 Elsevier Patient Education  2024 [employer].

## 2022-12-30 NOTE — Assessment & Plan Note (Signed)
Clinically stable.  Running its course without complications Guaiac-negative stools.  No lower GI bleed.  No red flag signs or symptoms. Benign abdominal examination Blood work done today

## 2023-03-26 ENCOUNTER — Other Ambulatory Visit: Payer: Self-pay | Admitting: Emergency Medicine

## 2023-03-26 DIAGNOSIS — E1165 Type 2 diabetes mellitus with hyperglycemia: Secondary | ICD-10-CM

## 2023-06-24 ENCOUNTER — Other Ambulatory Visit: Payer: Self-pay

## 2023-06-24 ENCOUNTER — Other Ambulatory Visit: Payer: Self-pay | Admitting: Emergency Medicine

## 2023-06-24 DIAGNOSIS — E1165 Type 2 diabetes mellitus with hyperglycemia: Secondary | ICD-10-CM

## 2023-06-24 DIAGNOSIS — I10 Essential (primary) hypertension: Secondary | ICD-10-CM

## 2023-06-24 MED ORDER — AMLODIPINE BESYLATE 5 MG PO TABS
ORAL_TABLET | ORAL | 0 refills | Status: DC
Start: 2023-06-24 — End: 2023-09-23

## 2023-07-26 ENCOUNTER — Ambulatory Visit: Payer: Medicare Other | Admitting: Emergency Medicine

## 2023-07-26 ENCOUNTER — Encounter: Payer: Self-pay | Admitting: Emergency Medicine

## 2023-07-26 VITALS — BP 110/74 | HR 83 | Temp 98.3°F | Ht 65.0 in | Wt 192.0 lb

## 2023-07-26 DIAGNOSIS — E1159 Type 2 diabetes mellitus with other circulatory complications: Secondary | ICD-10-CM | POA: Diagnosis not present

## 2023-07-26 DIAGNOSIS — E1169 Type 2 diabetes mellitus with other specified complication: Secondary | ICD-10-CM | POA: Diagnosis not present

## 2023-07-26 DIAGNOSIS — E785 Hyperlipidemia, unspecified: Secondary | ICD-10-CM | POA: Diagnosis not present

## 2023-07-26 DIAGNOSIS — E66811 Obesity, class 1: Secondary | ICD-10-CM

## 2023-07-26 DIAGNOSIS — I152 Hypertension secondary to endocrine disorders: Secondary | ICD-10-CM

## 2023-07-26 DIAGNOSIS — I119 Hypertensive heart disease without heart failure: Secondary | ICD-10-CM | POA: Diagnosis not present

## 2023-07-26 LAB — CBC WITH DIFFERENTIAL/PLATELET
Basophils Absolute: 0.1 10*3/uL (ref 0.0–0.1)
Basophils Relative: 1 % (ref 0.0–3.0)
Eosinophils Absolute: 0.2 10*3/uL (ref 0.0–0.7)
Eosinophils Relative: 3.3 % (ref 0.0–5.0)
HCT: 36.3 % (ref 36.0–46.0)
Hemoglobin: 12.1 g/dL (ref 12.0–15.0)
Lymphocytes Relative: 36.7 % (ref 12.0–46.0)
Lymphs Abs: 2 10*3/uL (ref 0.7–4.0)
MCHC: 33.4 g/dL (ref 30.0–36.0)
MCV: 88.7 fL (ref 78.0–100.0)
Monocytes Absolute: 0.5 10*3/uL (ref 0.1–1.0)
Monocytes Relative: 9.9 % (ref 3.0–12.0)
Neutro Abs: 2.7 10*3/uL (ref 1.4–7.7)
Neutrophils Relative %: 49.1 % (ref 43.0–77.0)
Platelets: 297 10*3/uL (ref 150.0–400.0)
RBC: 4.09 Mil/uL (ref 3.87–5.11)
RDW: 15 % (ref 11.5–15.5)
WBC: 5.5 10*3/uL (ref 4.0–10.5)

## 2023-07-26 LAB — LIPID PANEL
Cholesterol: 165 mg/dL (ref 0–200)
HDL: 69.4 mg/dL (ref >39.00)
LDL Cholesterol: 75 mg/dL (ref 0–99)
NonHDL: 95.98
Total CHOL/HDL Ratio: 2
Triglycerides: 106 mg/dL (ref 0.0–149.0)
VLDL: 21.2 mg/dL (ref 0.0–40.0)

## 2023-07-26 LAB — COMPREHENSIVE METABOLIC PANEL
ALT: 13 U/L (ref 0–35)
AST: 14 U/L (ref 0–37)
Albumin: 4.1 g/dL (ref 3.5–5.2)
Alkaline Phosphatase: 65 U/L (ref 39–117)
BUN: 24 mg/dL — ABNORMAL HIGH (ref 6–23)
CO2: 30 meq/L (ref 19–32)
Calcium: 9.6 mg/dL (ref 8.4–10.5)
Chloride: 101 meq/L (ref 96–112)
Creatinine, Ser: 0.96 mg/dL (ref 0.40–1.20)
GFR: 54.9 mL/min — ABNORMAL LOW (ref >60.00)
Glucose, Bld: 267 mg/dL — ABNORMAL HIGH (ref 70–99)
Potassium: 3.6 meq/L (ref 3.5–5.1)
Sodium: 140 meq/L (ref 135–145)
Total Bilirubin: 0.6 mg/dL (ref 0.2–1.2)
Total Protein: 6.8 g/dL (ref 6.0–8.3)

## 2023-07-26 LAB — POCT GLYCOSYLATED HEMOGLOBIN (HGB A1C): Hemoglobin A1C: 8.1 % — AB (ref 4.0–5.6)

## 2023-07-26 NOTE — Assessment & Plan Note (Signed)
 Well-controlled hypertension Continue losartan 50 mg daily, metoprolol succinate 50 mg daily, hydrochlorothiazide 25 mg daily and amlodipine 5 mg daily Hemoglobin A1c not at goal at 8.1, higher than before Patient states she knows what she has been doing wrong with her nutrition Continue glipizide 5 mg twice a day with metformin 500 mg twice a day Cardiovascular risks associated with hypertension and diabetes discussed Benefits of exercise discussed Diet and nutrition discussed Follow-up in 6 months

## 2023-07-26 NOTE — Assessment & Plan Note (Signed)
Diet and nutrition discussed. Advised to decrease amount of daily carbohydrate intake and daily calories and increase amount of plant based protein in her diet Benefits of exercise discussed.

## 2023-07-26 NOTE — Progress Notes (Signed)
 Angela Chambers 83 y.o.   Chief Complaint  Patient presents with  . Follow-up    Patient here for A1c f/u. LOV was 12/30/22. Patient states no other concerns     HISTORY OF PRESENT ILLNESS: This is a 83 y.o. female here for follow-up of chronic medical conditions including diabetes, hypertension, and dyslipidemia Overall doing well.  Has no complaints or medical concerns today.  HPI   Prior to Admission medications   Medication Sig Start Date End Date Taking? Authorizing Provider  acetaminophen (TYLENOL) 500 MG tablet Take 2 tablets (1,000 mg total) by mouth every 8 (eight) hours. 05/14/20  Yes Almon Hercules, MD  amLODipine (NORVASC) 5 MG tablet TAKE 1 TABLET(5 MG) BY MOUTH DAILY 06/24/23  Yes Marykay Lex, MD  aspirin 81 MG tablet Take 81 mg by mouth daily.   Yes [provider]  atorvastatin (LIPITOR) 20 MG tablet TAKE 1 TABLET(20 MG) BY MOUTH DAILY 10/09/22  Yes Marykay Lex, MD  Blood Glucose Monitoring Suppl (BLOOD GLUCOSE MONITOR SYSTEM) w/Device KIT 1 Units by Does not apply route 2 (two) times daily. 02/07/18  Yes Barnett Abu, [place] D, PA-C  brimonidine (ALPHAGAN) 0.2 % ophthalmic solution Place 1 drop into both eyes 2 (two) times daily. 05/06/20  Yes [provider]  Co-Enzyme Q-10 30 MG CAPS Take 30 mg by mouth daily.   Yes [provider]  dorzolamide-timolol (COSOPT) 22.3-6.8 MG/ML ophthalmic solution Place 1 drop into both eyes 2 (two) times daily. 09/23/17  Yes [provider]  glipiZIDE (GLUCOTROL) 5 MG tablet Take 1 tablet (5 mg total) by mouth 2 (two) times daily before a meal. 08/26/22 08/21/23 Yes Shaunae Sieloff, Eilleen Kempf, MD  hydrochlorothiazide (HYDRODIURIL) 25 MG tablet TAKE 1 TABLET(25 MG) BY MOUTH DAILY 12/29/22  Yes Duke, Roe Rutherford, PA  latanoprost (XALATAN) 0.005 % ophthalmic solution Place 1 drop into both eyes at bedtime. 09/21/18  Yes [provider]  losartan (COZAAR) 50 MG tablet Take 50 mg by mouth daily.    Yes [provider]  metFORMIN (GLUCOPHAGE) 500 MG tablet TAKE 1 TABLET(500 MG) BY MOUTH TWICE DAILY WITH A MEAL 06/24/23  Yes Symphani Eckstrom, Eilleen Kempf, MD  metoprolol succinate (TOPROL-XL) 50 MG 24 hr tablet TAKE 1 TABLET BY MOUTH DAILY IMMEDIATELY FOLLOWING A MEAL 12/29/22  Yes Duke, Roe Rutherford, PA  Multiple Vitamin (MULTIVITAMIN) tablet Take 1 tablet by mouth daily.   Yes [provider]  iron polysaccharides (NIFEREX) 150 MG capsule Take 1 capsule (150 mg total) by mouth daily. Patient not taking: Reported on 07/26/2023 08/25/21   Georgina Quint, MD    Allergies  Allergen Reactions  . Lisinopril Cough  . Metformin And Related     Diarrhea on 1000mg  BID, tolerates 500 mg BID    Patient Active Problem List   Diagnosis Date Noted  . Infectious gastroenteritis 12/30/2022  . Dark stools 12/30/2022  . Hypertension associated with diabetes (HCC) 08/16/2018  . Abnormal stress echo EKG portion with normal echo -> imaging confirmed the correct with normal coronaries 06/21/2015  . OA (osteoarthritis) of knee 09/25/2013  . Obesity (BMI 30.0-34.9)   . Hypertensive heart disease without CHF 11/30/2011  . Hyperlipidemia 08/25/2011  . Other and combined forms of senile cataract 07/04/2011  . Primary open angle glaucoma of both eyes, severe stage 07/04/2011  . Dyslipidemia associated with type 2 diabetes mellitus (HCC) 02/22/2009  . History of colonic polyps 02/22/2009    Past Medical History:  Diagnosis Date  .  Arthritis   . Cataract   . Diabetes mellitus   . Diabetes mellitus without complication (HCC)    Phreesia 05/15/2020  . Fibroids   . Glaucoma   . Glaucoma    Phreesia 05/15/2020  . Hyperlipidemia   . Hypertension 07 /2013    WITH EF GREATER THAN 55 % WITH MILD MR BUT NO PROLASPE ,ESSENTIALLY NORMAL DONE TO EVALUATE PALPITATIONS .  Marland Kitchen Obesity (BMI 30.0-34.9)   . Osteoporosis   . Palpitations    CONTROLLED ON BETA BLOCKER  . Symptomatic anemia post  operative blood loss 05/12/2020    Past Surgical History:  Procedure Laterality Date  . ABDOMINAL HYSTERECTOMY  10/18/ 1982   TAH-BSO for fibroids and adenomatous hyperplasia  . CARDIAC CATHETERIZATION  08/2009   NONOBSTRUCTIVE CAD WITH 20% IN THE LAD AND 20 % IN THE RCA NORMAL EF 65%  . CARDIAC CATHETERIZATION N/A 07/05/2015   Procedure: Left Heart Cath and Coronary Angiography;  Surgeon: Marykay Lex, MD;  Location: [facility] INVASIVE CV LAB;  Service: Cardiovascular: Angiographically minimal CAD. EF 60-65%  . CPET-MET Test (Cardiopulmonary Exercise Test)  03/06/2013   Adequate effort at 1.11. Peak VO2 12.3 is 86% in the normal range. Heart rate was just outside the normal range target being 127 beats a minute but this was 86% of projected. She did have an ischemic pattern after the anterograde threshold with increased heart rate and blunting of stroke volume during the last 3 minutes of exercise, but was asymptomatic.  Marland Kitchen JOINT REPLACEMENT  09/05/2009   L knee  . KNEE SURGERY  1984   per pt screws in R knee  . NM MYOVIEW LTD  06/2015   FALSE + GXT - Images Accurate:  TM - 6 min, 7 METs (93% MPHR) --> chest discomfort, notable ST depressions => Hight Risk Duke TM Score, but NO scintigraphic evidence of ischemia or infarction. EF 74$  . ORIF FEMUR FRACTURE Right 05/10/2020   Procedure: OPEN REDUCTION INTERNAL FIXATION (ORIF) DISTAL FEMUR FRACTURE;  Surgeon: Roby Lofts, MD;  Location: [facility] OR;  Service: Orthopedics;  Laterality: Right;  . TOTAL KNEE ARTHROPLASTY Right 09/25/2013   Procedure: RIGHT TOTAL KNEE ARTHROPLASTY;  Surgeon: Loanne Drilling, MD;  Location: [facility] ORS;  Service: Orthopedics;  Laterality: Right;  . TRANSTHORACIC ECHOCARDIOGRAM  July 2013   EF greater than 55%, mild MR no prolapse.  Normal  . TUBAL LIGATION      Social History   Socioeconomic History  . Marital status: Married    Spouse name: Not on file  . Number of children: 3  . Years of education: Not on file  .  Highest education level: Some college, no degree  Occupational History  . Occupation: retired  Tobacco Use  . Smoking status: Former    Current packs/day: 0.00    Average packs/day: 0.5 packs/day for 20.0 years (10.0 ttl pk-yrs)    Types: Cigarettes    Start date: 07/13/1961    Quit date: 07/13/1981    Years since quitting: 42.0  . Smokeless tobacco: Never  Vaping Use  . Vaping status: Never Used  Substance and Sexual Activity  . Alcohol use: No  . Drug use: No  . Sexual activity: Yes  Other Topics Concern  . Not on file  Social History Narrative   SHE IS MARRIED ,MOTHER OF 4, GRANDMOTHER OF 6. SHE DOES EXERCISE BUT SOMEWHAT LIMITED BECAUSE OF HER KNEE (S/P SURGERY 2015).   SHE QUIT SMOKING IN 1980 AND SHE  DOES NOT DRINK   Social Drivers of Health   Financial Resource Strain: Low Risk  (07/22/2023)   Overall Financial Resource Strain (CARDIA)   . Difficulty of Paying Living Expenses: Not hard at all  Food Insecurity: No Food Insecurity (07/22/2023)   Hunger Vital Sign   . Worried About Programme researcher, broadcasting/film/video in the Last Year: Never true   . Ran Out of Food in the Last Year: Never true  Transportation Needs: No Transportation Needs (07/22/2023)   PRAPARE - Transportation   . Lack of Transportation (Medical): No   . Lack of Transportation (Non-Medical): No  Physical Activity: Unknown (07/22/2023)   Exercise Vital Sign   . Days of Exercise per Week: 0 days   . Minutes of Exercise per Session: Patient declined  Stress: No Stress Concern Present (07/22/2023)   [employer] of Occupational Health - Occupational Stress Questionnaire   . Feeling of Stress : Not at all  Social Connections: Socially Integrated (07/22/2023)   Social Connection and Isolation Panel [NHANES]   . Frequency of Communication with Friends and Family: More than three times a week   . Frequency of Social Gatherings with Friends and Family: More than three times a week   . Attends Religious Services: More than 4  times per year   . Active Member of Clubs or Organizations: Yes   . Attends Banker Meetings: More than 4 times per year   . Marital Status: Married  Catering manager Violence: Not At Risk (10/21/2022)   Humiliation, Afraid, Rape, and Kick questionnaire   . Fear of Current or Ex-Partner: No   . Emotionally Abused: No   . Physically Abused: No   . Sexually Abused: No    Family History  Problem Relation Age of Onset  . Diabetes Mother   . Heart disease Mother   . Hyperlipidemia Mother   . Diabetes Sister   . Hyperlipidemia Sister   . Hypertension Sister   . Diabetes Brother   . Hyperlipidemia Brother   . Prostate cancer Son        prostate  . Hyperlipidemia Sister      Review of Systems  Constitutional: Negative.  Negative for chills and fever.  HENT: Negative.  Negative for congestion.   Respiratory: Negative.  Negative for cough and shortness of breath.   Cardiovascular: Negative.  Negative for chest pain and palpitations.  Gastrointestinal:  Negative for abdominal pain, diarrhea, nausea and vomiting.  Genitourinary: Negative.  Negative for dysuria and hematuria.  Skin: Negative.  Negative for rash.  Neurological: Negative.  Negative for dizziness and headaches.  All other systems reviewed and are negative.   Vitals:   07/26/23 1053  BP: 110/74  Pulse: 83  Temp: 98.3 F (36.8 C)  SpO2: 97%    Physical Exam Vitals reviewed.  Constitutional:      Appearance: Normal appearance.  HENT:     Head: Normocephalic.     Mouth/Throat:     Mouth: Mucous membranes are moist.     Pharynx: Oropharynx is clear.  Eyes:     Extraocular Movements: Extraocular movements intact.     Pupils: Pupils are equal, round, and reactive to light.  Cardiovascular:     Rate and Rhythm: Normal rate and regular rhythm.     Pulses: Normal pulses.     Heart sounds: Normal heart sounds.  Pulmonary:     Effort: Pulmonary effort is normal.     Breath sounds: Normal breath  sounds.  Musculoskeletal:     Cervical back: No tenderness.  Lymphadenopathy:     Cervical: No cervical adenopathy.  Skin:    General: Skin is warm and dry.     Capillary Refill: Capillary refill takes less than 2 seconds.  Neurological:     General: No focal deficit present.     Mental Status: She is alert and oriented to person, place, and time.  Psychiatric:        Mood and Affect: Mood normal.        Behavior: Behavior normal.   Results for orders placed or performed in visit on 07/26/23 (from the past 24 hours)  POCT HgB A1C     Status: Abnormal   Collection Time: 07/26/23 11:12 AM  Result Value Ref Range   Hemoglobin A1C 8.1 (A) 4.0 - 5.6 %   HbA1c POC (<> result, manual entry)     HbA1c, POC (prediabetic range)     HbA1c, POC (controlled diabetic range)        ASSESSMENT & PLAN: A total of 45 minutes was spent with the patient and counseling/coordination of care regarding preparing for this visit, review of most recent office visit notes, review of multiple chronic medical conditions and their management, cardiovascular risks associated with uncontrolled diabetes, review of all medications, review of most recent bloodwork results including interpretation of today's hemoglobin A1c, review of health maintenance items, education on nutrition, prognosis, documentation, and need for follow up.  Problem List Items Addressed This Visit       Cardiovascular and Mediastinum   Hypertensive heart disease without CHF (Chronic)   Despite having some mild LVH on previous evaluation, not having any signs or symptoms to suggest diastolic heart failure. No PND, orthopnea with trivial edema.       Hypertension associated with diabetes (HCC) - Primary (Chronic)   Well-controlled hypertension Continue losartan 50 mg daily, metoprolol succinate 50 mg daily, hydrochlorothiazide 25 mg daily and amlodipine 5 mg daily Hemoglobin A1c not at goal at 8.1, higher than before Patient states she  knows what she has been doing wrong with her nutrition Continue glipizide 5 mg twice a day with metformin 500 mg twice a day Cardiovascular risks associated with hypertension and diabetes discussed Benefits of exercise discussed Diet and nutrition discussed Follow-up in 6 months        Endocrine   Dyslipidemia associated with type 2 diabetes mellitus (HCC) (Chronic)   Chronic stable condition Lipid profile done today Continue atorvastatin 20 mg daily        Relevant Orders   POCT HgB A1C (Completed)   CBC with Differential/Platelet   Comprehensive metabolic panel   Lipid panel     Other   Obesity (BMI 30.0-34.9) (Chronic)   Diet and nutrition discussed Advised to decrease amount of daily carbohydrate intake and daily calories and increase amount of plant-based protein in her diet Benefits of exercise discussed        Patient Instructions  Diabetes Mellitus and Nutrition, Adult When you have diabetes, or diabetes mellitus, it is very important to have healthy eating habits because your blood sugar (glucose) levels are greatly affected by what you eat and drink. Eating healthy foods in the right amounts, at about the same times every day, can help you: Manage your blood glucose. Lower your risk of heart disease. Improve your blood pressure. Reach or maintain a healthy weight. What can affect my meal plan? Every person with diabetes is different, and each person has different needs  for a meal plan. Your health care provider may recommend that you work with a dietitian to make a meal plan that is best for you. Your meal plan may vary depending on factors such as: The calories you need. The medicines you take. Your weight. Your blood glucose, blood pressure, and cholesterol levels. Your activity level. Other health conditions you have, such as heart or kidney disease. How do carbohydrates affect me? Carbohydrates, also called carbs, affect your blood glucose level more  than any other type of food. Eating carbs raises the amount of glucose in your blood. It is important to know how many carbs you can safely have in each meal. This is different for every person. Your dietitian can help you calculate how many carbs you should have at each meal and for each snack. How does alcohol affect me? Alcohol can cause a decrease in blood glucose (hypoglycemia), especially if you use insulin or take certain diabetes medicines by mouth. Hypoglycemia can be a life-threatening condition. Symptoms of hypoglycemia, such as sleepiness, dizziness, and confusion, are similar to symptoms of having too much alcohol. Do not drink alcohol if: Your health care provider tells you not to drink. You are pregnant, may be pregnant, or are planning to become pregnant. If you drink alcohol: Limit how much you have to: 0-1 drink a day for women. 0-2 drinks a day for men. Know how much alcohol is in your drink. In the [place] one drink equals one 12 oz bottle of beer (355 mL), one 5 oz glass of wine (148 mL), or one 1 oz glass of hard liquor (44 mL). Keep yourself hydrated with water, diet soda, or unsweetened iced tea. Keep in mind that regular soda, juice, and other mixers may contain a lot of sugar and must be counted as carbs. What are tips for following this plan?  Reading food labels Start by checking the serving size on the Nutrition Facts label of packaged foods and drinks. The number of calories and the amount of carbs, fats, and other nutrients listed on the label are based on one serving of the item. Many items contain more than one serving per package. Check the total grams (g) of carbs in one serving. Check the number of grams of saturated fats and trans fats in one serving. Choose foods that have a low amount or none of these fats. Check the number of milligrams (mg) of salt (sodium) in one serving. Most people should limit total sodium intake to less than 2,300 mg per day. Always  check the nutrition information of foods labeled as "low-fat" or "nonfat." These foods may be higher in added sugar or refined carbs and should be avoided. Talk to your dietitian to identify your daily goals for nutrients listed on the label. Shopping Avoid buying canned, pre-made, or processed foods. These foods tend to be high in fat, sodium, and added sugar. Shop around the outside edge of the grocery store. This is where you will most often find fresh fruits and vegetables, bulk grains, fresh meats, and fresh dairy products. Cooking Use low-heat cooking methods, such as baking, instead of high-heat cooking methods, such as deep frying. Cook using healthy oils, such as olive, canola, or sunflower oil. Avoid cooking with butter, cream, or high-fat meats. Meal planning Eat meals and snacks regularly, preferably at the same times every day. Avoid going long periods of time without eating. Eat foods that are high in fiber, such as fresh fruits, vegetables, beans, and whole grains.  Eat 4-6 oz (112-168 g) of lean protein each day, such as lean meat, chicken, fish, eggs, or tofu. One ounce (oz) (28 g) of lean protein is equal to: 1 oz (28 g) of meat, chicken, or fish. 1 egg.  cup (62 g) of tofu. Eat some foods each day that contain healthy fats, such as avocado, nuts, seeds, and fish. What foods should I eat? Fruits Berries. Apples. Oranges. Peaches. Apricots. Plums. Grapes. Mangoes. Papayas. Pomegranates. Kiwi. Cherries. Vegetables Leafy greens, including lettuce, spinach, kale, chard, collard greens, mustard greens, and cabbage. Beets. Cauliflower. Broccoli. Carrots. Green beans. Tomatoes. Peppers. Onions. Cucumbers. Brussels sprouts. Grains Whole grains, such as whole-wheat or whole-grain bread, crackers, tortillas, cereal, and pasta. Unsweetened oatmeal. Quinoa. Brown or wild rice. Meats and other proteins Seafood. Poultry without skin. Lean cuts of poultry and beef. Tofu. Nuts.  Seeds. Dairy Low-fat or fat-free dairy products such as milk, yogurt, and cheese. The items listed above may not be a complete list of foods and beverages you can eat and drink. Contact a dietitian for more information. What foods should I avoid? Fruits Fruits canned with syrup. Vegetables Canned vegetables. Frozen vegetables with butter or cream sauce. Grains Refined white flour and flour products such as bread, pasta, snack foods, and cereals. Avoid all processed foods. Meats and other proteins Fatty cuts of meat. Poultry with skin. Breaded or fried meats. Processed meat. Avoid saturated fats. Dairy Full-fat yogurt, cheese, or milk. Beverages Sweetened drinks, such as soda or iced tea. The items listed above may not be a complete list of foods and beverages you should avoid. Contact a dietitian for more information. Questions to ask a health care provider Do I need to meet with a certified diabetes care and education specialist? Do I need to meet with a dietitian? What number can I call if I have questions? When are the best times to check my blood glucose? Where to find more information: American Diabetes Association: diabetes.org Academy of Nutrition and Dietetics: eatright.[employer] of Diabetes and Digestive and Kidney Diseases: StageSync.si Association of Diabetes Care & Education Specialists: diabeteseducator.org Summary It is important to have healthy eating habits because your blood sugar (glucose) levels are greatly affected by what you eat and drink. It is important to use alcohol carefully. A healthy meal plan will help you manage your blood glucose and lower your risk of heart disease. Your health care provider may recommend that you work with a dietitian to make a meal plan that is best for you. This information is not intended to replace advice given to you by your health care provider. Make sure you discuss any questions you have with your health care  provider. Document Revised: 12/19/2019 Document Reviewed: 12/20/2019 Elsevier Patient Education  2024 Elsevier Inc.    Edwina Barth, MD  Primary Care at [facility]

## 2023-07-26 NOTE — Assessment & Plan Note (Signed)
 Despite having some mild LVH on previous evaluation, not having any signs or symptoms to suggest diastolic heart failure.  No PND, orthopnea with trivial edema.

## 2023-07-26 NOTE — Patient Instructions (Signed)

## 2023-07-26 NOTE — Assessment & Plan Note (Signed)
Chronic stable condition Lipid profile done today Continue atorvastatin 20 mg daily

## 2023-07-27 ENCOUNTER — Encounter: Payer: Self-pay | Admitting: Emergency Medicine

## 2023-08-24 ENCOUNTER — Ambulatory Visit: Payer: Medicare Other | Attending: Cardiology | Admitting: Cardiology

## 2023-08-24 ENCOUNTER — Encounter: Payer: Self-pay | Admitting: Cardiology

## 2023-08-24 VITALS — BP 138/72 | HR 75 | Ht 65.0 in | Wt 191.0 lb

## 2023-08-24 DIAGNOSIS — E782 Mixed hyperlipidemia: Secondary | ICD-10-CM

## 2023-08-24 DIAGNOSIS — R9439 Abnormal result of other cardiovascular function study: Secondary | ICD-10-CM

## 2023-08-24 DIAGNOSIS — E66811 Obesity, class 1: Secondary | ICD-10-CM

## 2023-08-24 DIAGNOSIS — E1169 Type 2 diabetes mellitus with other specified complication: Secondary | ICD-10-CM

## 2023-08-24 DIAGNOSIS — E785 Hyperlipidemia, unspecified: Secondary | ICD-10-CM

## 2023-08-24 DIAGNOSIS — I119 Hypertensive heart disease without heart failure: Secondary | ICD-10-CM | POA: Diagnosis not present

## 2023-08-24 NOTE — Assessment & Plan Note (Signed)
 Thankfully, she does not have any further chest pain episodes or exertional dyspnea that would require ischemic evaluation.  In the future, if she were to have symptoms, would consider Coronary CTA.

## 2023-08-24 NOTE — Patient Instructions (Addendum)
 Medication Instructions:   No changes *If you need a refill on your cardiac medications before your next appointment, please call your pharmacy*   Lab Work: Not needed    Testing/Procedures:  No  needed  Follow-Up: At [facility], you and your health needs are our priority.  As part of our continuing mission to provide you with exceptional heart care, we have created designated Provider Care Teams.  These Care Teams include your primary Cardiologist (physician) and Advanced Practice Providers (APPs -  Physician Assistants and Nurse Practitioners) who all work together to provide you with the care you need, when you need it.     Your next appointment:   12 month(s)  The format for your next appointment:   In Person  Provider:   Bryan Lemma, MD  or Edd Fabian, FNP, Bernadene Person, NP, or Reather Littler, NP       Other Instructions s

## 2023-08-24 NOTE — Assessment & Plan Note (Signed)
 The patient understands the need to lose weight with diet and exercise. We have discussed specific strategies for this.

## 2023-08-24 NOTE — Assessment & Plan Note (Signed)
 Hyperlipidemia LDL improved from 152 mg/dL to 75 mg/dL due to dietary changes. Current atorvastatin dose effective. - Continue atorvastatin 20 mg daily. - Encourage dietary management to maintain LDL levels.  Type 2 Diabetes Mellitus Hemoglobin A1c increased from 6.6% to 8.1%, indicating suboptimal glycemic control. Emphasis on dietary management. - Continue metformin 500 mg twice daily and glipizide 5 mg daily. - Emphasize dietary management to improve glycemic control. - Encourage regular blood glucose monitoring.

## 2023-08-24 NOTE — Assessment & Plan Note (Signed)
 Blood pressure generally well-controlled. Slight systolic elevation possibly due to dietary salt. Current regimen effective. Euvolemic on exam with no diuretic requirement beyond the HCTZ - Continue amlodipine 5 mg daily, hydrochlorothiazide 25 mg daily, losartan 50 mg daily, toprol 50 mg daily.

## 2023-08-24 NOTE — Progress Notes (Signed)
 Cardiology Office Note:  .   Date:  08/24/2023  ID:  Angela Chambers, DOB [birth year], MRN [id number] PCP: Georgina Quint, MD  [facility] Providers Cardiologist:  Bryan Lemma, MD Cardiology APP:  Marcelino Duster, [place]     Chief Complaint  Patient presents with   Follow-up    annual   Hypertension    Patient Profile: .     Angela Chambers is an obese 83 y.o. female former smoker with a PMH notable for HTN with HTN Heart Disease, DM-2 & HLD  who presents here for ~ annual f/u at the request of Georgina Quint, *.  Has h/o False Positive Stress Test  - minimal CAD by Cath.     Angela Chambers was last seen on 07/14/2022 by Micah Flesher, PA -> despite Lipids not being at goal, she was not interested in increasing statin dose.   Subjective  Discussed the use of AI scribe software for clinical note transcription with the patient, who gave verbal consent to proceed.  History of Present Illness   Angela Chambers is a 83 year old female with hypertension and diabetes who presents for a cardiology follow-up.  Blood pressure readings are generally stable, ranging from 118 to 120 mmHg, with a recent increase attributed to a high-sodium meal. No chest pain, pressure, tightness, shortness of breath, palpitations, or leg swelling.  She has a history of hyperlipidemia with significant improvement in LDL cholesterol from 152 mg/dL last year to 75 mg/dL currently, attributed to dietary changes including increased consumption of salmon and asparagus, and reduced intake of high-calorie foods like pound cake. No recent weight loss, but mentions weight gain over the holiday season.  Diabetes management is under review as A1c has increased from 6.6% to 8.1%. She is on metformin 500 mg twice daily and glipizide 5 mg daily. She acknowledges the need to improve dietary habits and physical activity levels, as she has not been exercising regularly.  Current  medications include amlodipine 5 mg daily, hydrochlorothiazide 25 mg daily, losartan 50 mg daily, toprol 50 mg daily, atorvastatin 20 mg daily, and aspirin 81 mg daily. She also takes CoQ10 and uses eye drops as needed.  Family history includes two sisters who required pacemakers.       Objective   Current Meds  Medication Sig   acetaminophen (TYLENOL) 500 MG tablet Take 2 tablets (1,000 mg total) by mouth every 8 (eight) hours.   amLODipine (NORVASC) 5 MG tablet TAKE 1 TABLET(5 MG) BY MOUTH DAILY   aspirin 81 MG tablet Take 81 mg by mouth daily.   atorvastatin (LIPITOR) 20 MG tablet TAKE 1 TABLET(20 MG) BY MOUTH DAILY   Blood Glucose Monitoring Suppl (BLOOD GLUCOSE MONITOR SYSTEM) w/Device KIT 1 Units by Does not apply route 2 (two) times daily.   brimonidine (ALPHAGAN) 0.2 % ophthalmic solution Place 1 drop into both eyes 2 (two) times daily.   Co-Enzyme Q-10 30 MG CAPS Take 30 mg by mouth daily.   dorzolamide-timolol (COSOPT) 22.3-6.8 MG/ML ophthalmic solution Place 1 drop into both eyes 2 (two) times daily.   hydrochlorothiazide (HYDRODIURIL) 25 MG tablet TAKE 1 TABLET(25 MG) BY MOUTH DAILY   iron polysaccharides (NIFEREX) 150 MG capsule Take 1 capsule (150 mg total) by mouth daily.   latanoprost (XALATAN) 0.005 % ophthalmic solution Place 1 drop into both eyes at bedtime.   losartan (COZAAR) 50 MG tablet Take 50 mg by mouth daily.   metFORMIN (GLUCOPHAGE) 500 MG  tablet TAKE 1 TABLET(500 MG) BY MOUTH TWICE DAILY WITH A MEAL   metoprolol succinate (TOPROL-XL) 50 MG 24 hr tablet TAKE 1 TABLET BY MOUTH DAILY IMMEDIATELY FOLLOWING A MEAL   Multiple Vitamin (MULTIVITAMIN) tablet Take 1 tablet by mouth daily.    Studies Reviewed: Marland Kitchen   EKG Interpretation Date/Time:  Tuesday August 24 2023 08:20:20 EDT Ventricular Rate:  75 PR Interval:  192 QRS Duration:  78 QT Interval:  398 QTC Calculation: 444 R Axis:   23  Text Interpretation: Normal sinus rhythm Normal ECG When compared with ECG  of 30-Aug-2009 06:54, No significant change was found Confirmed by Bryan Lemma ([id number]) on 08/24/2023 8:31:27 AM    Myoview January 2017: Chest pain with EKG changes on exertion but normal stress images.  EF greater than 65%.  Blunted BP response to exercise. CATH February 2017: Angiographically coronary arteries, very tortuous  Lab Results  Component Value Date   CHOL 165 07/26/2023   HDL 69.40 07/26/2023   LDLCALC 75 07/26/2023   TRIG 106.0 07/26/2023   CHOLHDL 2 07/26/2023   Lab Results  Component Value Date   HGBA1C 8.1 (A) 07/26/2023   Lab Results  Component Value Date   NA 140 07/26/2023   K 3.6 07/26/2023   CREATININE 0.96 07/26/2023   GFR 54.90 (L) 07/26/2023   GLUCOSE 267 (H) 07/26/2023   Lab Results  Component Value Date   WBC 5.5 07/26/2023   HGB 12.1 07/26/2023   HCT 36.3 07/26/2023   MCV 88.7 07/26/2023   PLT 297.0 07/26/2023    Risk Assessment/Calculations:              Physical Exam:   VS:  BP 138/72 (BP Location: Left Arm, Patient Position: Sitting, Cuff Size: Large)   Pulse 75   Ht 5\' 5"  (1.651 m)   Wt 191 lb (86.6 kg)   LMP  (LMP Unknown)   SpO2 98%   BMI 31.78 kg/m    Wt Readings from Last 3 Encounters:  08/24/23 191 lb (86.6 kg)  07/26/23 192 lb (87.1 kg)  12/30/22 183 lb 6 oz (83.2 kg)    GEN: Well nourished, well developed in no acute distress; mildly obese NECK: No JVD; No carotid bruits CARDIAC: Normal S1, S2; RRR, no murmurs, rubs, gallops RESPIRATORY:  Clear to auscultation without rales, wheezing or rhonchi ; nonlabored, good air movement. ABDOMEN: Soft, non-tender, non-distended EXTREMITIES:  No edema; No deformity     ASSESSMENT AND PLAN: .    Problem List Items Addressed This Visit       Cardiology Problems   Hyperlipidemia associated with type 2 diabetes mellitus (HCC) (Chronic)   Hyperlipidemia LDL improved from 152 mg/dL to 75 mg/dL due to dietary changes. Current atorvastatin dose effective. - Continue  atorvastatin 20 mg daily. - Encourage dietary management to maintain LDL levels.  Type 2 Diabetes Mellitus Hemoglobin A1c increased from 6.6% to 8.1%, indicating suboptimal glycemic control. Emphasis on dietary management. - Continue metformin 500 mg twice daily and glipizide 5 mg daily. - Emphasize dietary management to improve glycemic control. - Encourage regular blood glucose monitoring.      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 Slight systolic elevation possibly due to dietary salt. Current regimen effective. Euvolemic on exam with no diuretic requirement beyond the HCTZ - Continue amlodipine 5 mg daily, hydrochlorothiazide 25 mg daily, losartan 50 mg daily, toprol 50 mg daily.      Relevant Orders  EKG 12-Lead (Completed)     Other   Abnormal stress echo EKG portion with normal echo -> imaging confirmed the correct with normal coronaries (Chronic)   Thankfully, she does not have any further chest pain episodes or exertional dyspnea that would require ischemic evaluation.  In the future, if she were to have symptoms, would consider Coronary CTA.      Relevant Orders   EKG 12-Lead (Completed)   Obesity (BMI 30.0-34.9) (Chronic)   The patient understands the need to lose weight with diet and exercise. We have discussed specific strategies for this.          Cardiovascular Risk Monitoring Family history of cardiovascular issues. Prefers annual cardiology follow-ups. - Continue annual cardiology follow-up. Follow-Up: Return in about 1 year (around 08/23/2024).     Signed, Marykay Lex, MD, MS Bryan Lemma, M.D., M.S. Interventional Cardiologist  [facility]  Pager # [phone] Phone # [phone] [address] [place], [place] [address]

## 2023-09-22 ENCOUNTER — Other Ambulatory Visit: Payer: Self-pay | Admitting: Emergency Medicine

## 2023-09-22 ENCOUNTER — Other Ambulatory Visit: Payer: Self-pay | Admitting: Cardiology

## 2023-09-22 DIAGNOSIS — I10 Essential (primary) hypertension: Secondary | ICD-10-CM

## 2023-09-22 DIAGNOSIS — E1165 Type 2 diabetes mellitus with hyperglycemia: Secondary | ICD-10-CM

## 2023-09-28 LAB — LAB REPORT - SCANNED: Microalb Creat Ratio: 30

## 2023-10-08 ENCOUNTER — Other Ambulatory Visit: Payer: Self-pay

## 2023-10-08 MED ORDER — METOPROLOL SUCCINATE ER 50 MG PO TB24: ORAL_TABLET | ORAL | 3 refills | Status: AC

## 2023-10-26 ENCOUNTER — Ambulatory Visit (INDEPENDENT_AMBULATORY_CARE_PROVIDER_SITE_OTHER): Payer: Medicare Other

## 2023-10-26 VITALS — Ht 65.0 in | Wt 191.0 lb

## 2023-10-26 DIAGNOSIS — Z0000 Encounter for general adult medical examination without abnormal findings: Secondary | ICD-10-CM | POA: Diagnosis not present

## 2023-10-26 NOTE — Patient Instructions (Addendum)
 Angela Chambers , Thank you for taking time out of your busy schedule to complete your Annual Wellness Visit with me. I enjoyed our conversation and look forward to speaking with you again next year. I, as well as your care team,  appreciate your ongoing commitment to your health goals. Please review the following plan we discussed and let me know if I can assist you in the future. Your Game plan/ To Do List   Follow up Visits: Next Medicare AWV with our clinical staff: 10/30/2024   Have you seen your provider in the last 6 months (3 months if uncontrolled diabetes)? Yes Next Office Visit with your provider: 01/24/2024  Clinician Recommendations:  Aim for 30 minutes of exercise or brisk walking, 6-8 glasses of water, and 5 servings of fruits and vegetables each day.       This is a list of the screening recommended for you and due dates:  Health Maintenance  Topic Date Due   Complete foot exam   02/15/2020   COVID-19 Vaccine (6 - 2024-25 season) 08/17/2023   Flu Shot  12/31/2023   Hemoglobin A1C  01/23/2024   Eye exam for diabetics  04/15/2024   Yearly kidney function blood test for diabetes  07/25/2024   Yearly kidney health urinalysis for diabetes  09/27/2024   Medicare Annual Wellness Visit  10/25/2024   DTaP/Tdap/Td vaccine (3 - Td or Tdap) 04/28/2026   Pneumonia Vaccine  Completed   DEXA scan (bone density measurement)  Completed   Zoster (Shingles) Vaccine  Completed   HPV Vaccine  Aged Out   Meningitis B Vaccine  Aged Out    Advanced directives: (In Chart) A copy of your advanced directives are scanned into your chart should your provider ever need it. Advance Care Planning is important because it:  [x]  Makes sure you receive the medical care that is consistent with your values, goals, and preferences  [x]  It provides guidance to your family and loved ones and reduces their decisional burden about whether or not they are making the right decisions based on your wishes.  Follow  the link provided in your after visit summary or read over the paperwork we have mailed to you to help you started getting your Advance Directives in place. If you need assistance in completing these, please reach out to us  so that we can help you!

## 2023-10-26 NOTE — Progress Notes (Signed)
 Subjective:   Angela Chambers is a 83 y.o. who presents for a Medicare Wellness preventive visit.  As a reminder, Annual Wellness Visits don't include a physical exam, and some assessments may be limited, especially if this visit is performed virtually. We may recommend an in-person follow-up visit with your provider if needed.  Visit Complete: Virtual I connected with  Angela Chambers on 10/26/23 by a audio enabled telemedicine application and verified that I am speaking with the correct person using two identifiers.  Patient Location: Home  Provider Location: Office/Clinic  I discussed the limitations of evaluation and management by telemedicine. The patient expressed understanding and agreed to proceed.  Vital Signs: Because this visit was a virtual/telehealth visit, some criteria may be missing or patient reported. Any vitals not documented were not able to be obtained and vitals that have been documented are patient reported.  VideoDeclined- This patient declined Librarian, academic. Therefore the visit was completed with audio only.  Persons Participating in Visit: Patient.  AWV Questionnaire: Yes: Patient Medicare AWV questionnaire was completed by the patient on 10/25/2023; I have confirmed that all information answered by patient is correct and no changes since this date.  Cardiac Risk Factors include: advanced age (>32men, >51 women);diabetes mellitus;dyslipidemia;hypertension;obesity (BMI >30kg/m2)     Objective:     Today's Vitals   10/26/23 1033  Weight: 191 lb (86.6 kg)  Height: 5\' 5"  (1.651 m)   Body mass index is 31.78 kg/m.     10/26/2023   10:32 AM 10/21/2022   10:34 AM 10/13/2021    2:37 PM 05/18/2020    7:02 AM 05/09/2020    3:14 PM 08/24/2017    2:25 PM 08/09/2017   11:49 AM  Advanced Directives  Does Patient Have a Medical Advance Directive? Yes Yes Yes No Yes Yes Yes  Type of Estate agent of  [place];Living will Living will Healthcare Power of [place];Living will  Healthcare Power of [place];Living will Healthcare Power of [place];Living will Living will  Does patient want to make changes to medical advance directive? No - Patient declined   No - Patient declined No - Patient declined  No - Patient declined  Copy of Healthcare Power of Attorney in Chart? Yes - validated most recent copy scanned in chart (See row information)  No - copy requested  No - copy requested No - copy requested No - copy requested  Would patient like information on creating a medical advance directive?    No - Patient declined       Current Medications (verified) Outpatient Encounter Medications as of 10/26/2023  Medication Sig   acetaminophen  (TYLENOL ) 500 MG tablet Take 2 tablets (1,000 mg total) by mouth every 8 (eight) hours.   amLODipine  (NORVASC ) 5 MG tablet TAKE 1 TABLET(5 MG) BY MOUTH DAILY   aspirin  81 MG tablet Take 81 mg by mouth daily.   atorvastatin  (LIPITOR) 20 MG tablet TAKE 1 TABLET(20 MG) BY MOUTH DAILY   Blood Glucose Monitoring Suppl (BLOOD GLUCOSE MONITOR SYSTEM) w/Device KIT 1 Units by Does not apply route 2 (two) times daily.   brimonidine  (ALPHAGAN ) 0.2 % ophthalmic solution Place 1 drop into both eyes 2 (two) times daily.   Co-Enzyme Q-10 30 MG CAPS Take 30 mg by mouth daily.   dorzolamide -timolol  (COSOPT ) 22.3-6.8 MG/ML ophthalmic solution Place 1 drop into both eyes 2 (two) times daily.   hydrochlorothiazide  (HYDRODIURIL ) 25 MG tablet TAKE 1 TABLET(25 MG) BY MOUTH DAILY  iron  polysaccharides (NIFEREX) 150 MG capsule Take 1 capsule (150 mg total) by mouth daily.   latanoprost  (XALATAN ) 0.005 % ophthalmic solution Place 1 drop into both eyes at bedtime.   losartan  (COZAAR ) 50 MG tablet Take 50 mg by mouth daily.   metFORMIN  (GLUCOPHAGE ) 500 MG tablet TAKE 1 TABLET(500 MG) BY MOUTH TWICE DAILY WITH A MEAL   metoprolol  succinate (TOPROL -XL) 50 MG 24 hr tablet TAKE 1 TABLET BY MOUTH  DAILY IMMEDIATELY FOLLOWING A MEAL   Multiple Vitamin (MULTIVITAMIN) tablet Take 1 tablet by mouth daily.   glipiZIDE  (GLUCOTROL ) 5 MG tablet Take 1 tablet (5 mg total) by mouth 2 (two) times daily before a meal.   No facility-administered encounter medications on file as of 10/26/2023.    Allergies (verified) Lisinopril and Metformin  and related   History: Past Medical History:  Diagnosis Date   Arthritis    Cataract    Diabetes mellitus    Diabetes mellitus without complication (HCC)    Phreesia 05/15/2020   Fibroids    Glaucoma    Glaucoma    Phreesia 05/15/2020   Hyperlipidemia    Hypertension 07 /2013    WITH EF GREATER THAN 55 % WITH MILD MR BUT NO PROLASPE ,ESSENTIALLY NORMAL DONE TO EVALUATE PALPITATIONS .   Obesity (BMI 30.0-34.9)    Osteoporosis    Palpitations    CONTROLLED ON BETA BLOCKER   Symptomatic anemia post operative blood loss 05/12/2020   Past Surgical History:  Procedure Laterality Date   ABDOMINAL HYSTERECTOMY  10/18/ 1982   TAH-BSO for fibroids and adenomatous hyperplasia   CARDIAC CATHETERIZATION  08/2009   NONOBSTRUCTIVE CAD WITH 20% IN THE LAD AND 20 % IN THE RCA NORMAL EF 65%   CARDIAC CATHETERIZATION N/A 07/05/2015   Procedure: Left Heart Cath and Coronary Angiography;  Surgeon: Arleen Lacer, MD;  Location: [facility] INVASIVE CV LAB;  Service: Cardiovascular: Angiographically minimal CAD. EF 60-65%   CPET-MET Test (Cardiopulmonary Exercise Test)  03/06/2013   Adequate effort at 1.11. Peak VO2 12.3 is 86% in the normal range. Heart rate was just outside the normal range target being 127 beats a minute but this was 86% of projected. She did have an ischemic pattern after the anterograde threshold with increased heart rate and blunting of stroke volume during the last 3 minutes of exercise, but was asymptomatic.   JOINT REPLACEMENT  09/05/2009   L knee   KNEE SURGERY  1984   per pt screws in R knee   NM MYOVIEW  LTD  06/2015   FALSE + GXT - Images  Accurate:  TM - 6 min, 7 METs (93% MPHR) --> chest discomfort, notable ST depressions => Hight Risk Duke TM Score, but NO scintigraphic evidence of ischemia or infarction. 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  Normal   TUBAL LIGATION     Family History  Problem Relation Age of Onset   Diabetes Mother    Heart disease Mother    Hyperlipidemia Mother    Diabetes Sister    Hyperlipidemia Sister  Hypertension Sister    Diabetes Brother    Hyperlipidemia Brother    Prostate cancer Son        prostate   Hyperlipidemia Sister    Social History   Socioeconomic History   Marital status: Married    Spouse name: Not on file   Number of children: 3   Years of education: Not on file   Highest education level: Some college, no degree  Occupational History   Occupation: retired  Tobacco Use   Smoking status: Former    Current packs/day: 0.00    Average packs/day: 0.5 packs/day for 20.0 years (10.0 ttl pk-yrs)    Types: Cigarettes    Start date: 07/13/1961    Quit date: 07/13/1981    Years since quitting: 42.3    Passive exposure: Past   Smokeless tobacco: Never  Vaping Use   Vaping status: Never Used  Substance and Sexual Activity   Alcohol use: No   Drug use: No   Sexual activity: Yes  Other Topics Concern   Not on file  Social History Narrative   SHE IS MARRIED ,MOTHER OF 4, GRANDMOTHER OF 6. SHE DOES EXERCISE BUT SOMEWHAT LIMITED BECAUSE OF HER KNEE (S/P SURGERY 2015).   SHE QUIT SMOKING IN 1980 AND SHE DOES NOT DRINK   Social Drivers of  Health   Financial Resource Strain: Low Risk  (10/26/2023)   Overall Financial Resource Strain (CARDIA)    Difficulty of Paying Living Expenses: Not hard at all  Food Insecurity: No Food Insecurity (10/26/2023)   Hunger Vital Sign    Worried About Running Out of Food in the Last Year: Never true    Ran Out of Food in the Last Year: Never true  Transportation Needs: No Transportation Needs (10/26/2023)   PRAPARE - Administrator, Civil Service (Medical): No    Lack of Transportation (Non-Medical): No  Physical Activity: Insufficiently Active (10/26/2023)   Exercise Vital Sign    Days of Exercise per Week: 7 days    Minutes of Exercise per Session: 10 min  Stress: No Stress Concern Present (10/26/2023)   [employer] of Occupational Health - Occupational Stress Questionnaire    Feeling of Stress : Not at all  Social Connections: Socially Integrated (10/26/2023)   Social Connection and Isolation Panel [NHANES]    Frequency of Communication with Friends and Family: More than three times a week    Frequency of Social Gatherings with Friends and Family: More than three times a week    Attends Religious Services: More than 4 times per year    Active Member of [employer] or Organizations: Yes    Attends Engineer, structural: More than 4 times per year    Marital Status: Married    Tobacco Counseling Counseling given: No    Clinical Intake:  Pre-visit preparation completed: Yes  Pain : No/denies pain     BMI - recorded: 31.78 Nutritional Status: BMI > 30  Obese Nutritional Risks: None Diabetes: Yes CBG done?: No Did pt. bring in CBG monitor from home?: No  Lab Results  Component Value Date   HGBA1C 8.1 (A) 07/26/2023   HGBA1C 6.6 (A) 08/26/2022   HGBA1C 6.5 (A) 02/26/2022     How often do you need to have someone help you when you read instructions, pamphlets, or other written materials from your doctor or pharmacy?: 1 - Never  Interpreter Needed?:  No  Information entered by :: Kandy Orris, CMA   Activities  of Daily Living     10/26/2023   10:35 AM 10/25/2023    9:44 AM  In your present state of health, do you have any difficulty performing the following activities:  Hearing? 0 0  Vision? 0 0  Difficulty concentrating or making decisions? 0 0  Walking or climbing stairs? 0 0  Dressing or bathing? 0 0  Doing errands, shopping? 0 0  Preparing Food and eating ? N N  Using the Toilet? N N  In the past six months, have you accidently leaked urine? N N  Do you have problems with loss of bowel control? N N  Managing your Medications? N N  Managing your Finances? N N  Housekeeping or managing your Housekeeping? N N    Patient Care Team: Elvira Hammersmith, MD as PCP - General (Internal Medicine) Arleen Lacer, MD as PCP - Cardiology (Cardiology) Arleen Lacer, MD as Attending Physician (Cardiology) Bond, Muriel Arm, MD as Referring Physician (Ophthalmology) Haddix, Florentina Huntsman, MD as Consulting Physician (Orthopedic Surgery) Duke, Warren Haber, PA as Physician Assistant (Cardiology)  Indicate any recent Medical Services you may have received from other than [facility] providers in the past year (date may be approximate).     Assessment:    This is a routine wellness examination for Angela Chambers.  Hearing/Vision screen Hearing Screening - Comments:: Denies hearing difficulties   Vision Screening - Comments:: Wears rx glasses - up to date with routine eye exams with Dr Leanor Proper   Goals Addressed               This Visit's Progress     Patient Stated (pt-stated)        Patient stated she plans to stay active and eat healthy.Aaron Aasexercise more         Depression Screen     10/26/2023   10:36 AM 07/26/2023   10:59 AM 12/30/2022    2:12 PM 10/21/2022   10:35 AM 08/26/2022    9:44 AM 02/26/2022   10:45 AM 10/13/2021    2:41 PM  PHQ 2/9 Scores  PHQ - 2 Score 0 0 0 0 0 0 0  PHQ- 9 Score 1   1       Fall Risk      10/26/2023   10:36 AM 10/25/2023    9:44 AM 07/26/2023   10:59 AM 12/30/2022    2:12 PM 10/21/2022   10:35 AM  Fall Risk   Falls in the past year? 0 0 0 0 0  Number falls in past yr: 0  0 0 0  Injury with Fall? 0  0 0 0  Risk for fall due to : No Fall Risks  No Fall Risks No Fall Risks No Fall Risks  Follow up Falls evaluation completed;Falls prevention discussed  Falls evaluation completed Falls evaluation completed Falls prevention discussed    MEDICARE RISK AT HOME:  Medicare Risk at Home Any stairs in or around the home?: Yes If so, are there any without handrails?: No Home free of loose throw rugs in walkways, pet beds, electrical cords, etc?: Yes Adequate lighting in your home to reduce risk of falls?: Yes Life alert?: No Use of a cane, walker or w/c?: Yes (cane) Grab bars in the bathroom?: Yes Shower chair or bench in shower?: No Elevated toilet seat or a handicapped toilet?: Yes  TIMED UP AND GO:  Was the test performed?  No  Cognitive Function: 6CIT completed  10/26/2023   10:40 AM 10/21/2022   10:39 AM 08/24/2017    2:29 PM  6CIT Screen  What Year? 0 points 0 points 0 points  What month? 0 points 0 points 0 points  What time? 0 points 0 points 0 points  Count back from 20 0 points 0 points 0 points  Months in reverse 0 points 0 points 0 points  Repeat phrase 0 points 0 points 2 points  Total Score 0 points 0 points 2 points    Immunizations Immunization History  Administered Date(s) Administered   Fluad Quad(high Dose 65+) 02/29/2020   Influenza Split 04/15/2011, 02/17/2012, 03/09/2013   Influenza, High Dose Seasonal PF 01/29/2016, 01/20/2017, 02/07/2018, 01/26/2019   Influenza-Unspecified 02/23/2014, 01/31/2015, 01/20/2017, 02/20/2021, 02/02/2022, 03/02/2023   Moderna Covid-19 Fall Seasonal Vaccine 39yrs & older 02/17/2023   Moderna Sars-Covid-2 Vaccination 02/14/2021   PFIZER(Purple Top)SARS-COV-2 Vaccination 06/26/2019, 07/17/2019, 03/27/2020    Pneumococcal Conjugate-13 11/27/2013   Pneumococcal Polysaccharide-23 10/27/2006   Tdap 04/19/2006, 04/28/2016   Zoster Recombinant(Shingrix) 10/31/2016, 01/20/2017   Zoster, Live 07/02/2008    Screening Tests Health Maintenance  Topic Date Due   FOOT EXAM  02/15/2020   COVID-19 Vaccine (6 - 2024-25 season) 08/17/2023   INFLUENZA VACCINE  12/31/2023   HEMOGLOBIN A1C  01/23/2024   OPHTHALMOLOGY EXAM  04/15/2024   Diabetic kidney evaluation - eGFR measurement  07/25/2024   Diabetic kidney evaluation - Urine ACR  09/27/2024   Medicare Annual Wellness (AWV)  10/25/2024   DTaP/Tdap/Td (3 - Td or Tdap) 04/28/2026   Pneumonia Vaccine 35+ Years old  Completed   DEXA SCAN  Completed   Zoster Vaccines- Shingrix  Completed   HPV VACCINES  Aged Out   Meningococcal B Vaccine  Aged Out    Health Maintenance  Health Maintenance Due  Topic Date Due   FOOT EXAM  02/15/2020   COVID-19 Vaccine (6 - 2024-25 season) 08/17/2023   Health Maintenance Items Addressed: 10/26/2023   Additional Screening:  Vision Screening: Recommended annual ophthalmology exams for early detection of glaucoma and other disorders of the eye.  Dental Screening: Recommended annual dental exams for proper oral hygiene  Community Resource Referral / Chronic Care Management: CRR required this visit?  No   CCM required this visit?  No   Plan:    I have personally reviewed and noted the following in the patient's chart:   Medical and social history Use of alcohol, tobacco or illicit drugs  Current medications and supplements including opioid prescriptions. Patient is not currently taking opioid prescriptions. Functional ability and status Nutritional status Physical activity Advanced directives List of other physicians Hospitalizations, surgeries, and ER visits in previous 12 months Vitals Screenings to include cognitive, depression, and falls Referrals and appointments  In addition, I have reviewed and  discussed with patient certain preventive protocols, quality metrics, and best practice recommendations. A written personalized care plan for preventive services as well as general preventive health recommendations were provided to patient.   Patria Bookbinder, CMA   10/26/2023   After Visit Summary: (MyChart) Due to this being a telephonic visit, the after visit summary with patients personalized plan was offered to patient via MyChart   Notes: Nothing significant to report at this time.

## 2023-11-24 ENCOUNTER — Other Ambulatory Visit: Payer: Self-pay | Admitting: Cardiology

## 2023-11-24 DIAGNOSIS — E119 Type 2 diabetes mellitus without complications: Secondary | ICD-10-CM

## 2023-12-21 ENCOUNTER — Other Ambulatory Visit: Payer: Self-pay | Admitting: Emergency Medicine

## 2023-12-21 DIAGNOSIS — E1165 Type 2 diabetes mellitus with hyperglycemia: Secondary | ICD-10-CM

## 2024-01-07 ENCOUNTER — Other Ambulatory Visit: Payer: Self-pay | Admitting: Emergency Medicine

## 2024-01-07 DIAGNOSIS — E1159 Type 2 diabetes mellitus with other circulatory complications: Secondary | ICD-10-CM

## 2024-01-07 DIAGNOSIS — E1169 Type 2 diabetes mellitus with other specified complication: Secondary | ICD-10-CM

## 2024-01-24 ENCOUNTER — Ambulatory Visit: Payer: Medicare Other | Admitting: Emergency Medicine

## 2024-01-24 ENCOUNTER — Encounter: Payer: Self-pay | Admitting: Emergency Medicine

## 2024-01-24 VITALS — BP 112/64 | HR 78 | Temp 97.8°F | Ht 65.0 in | Wt 185.0 lb

## 2024-01-24 DIAGNOSIS — E1159 Type 2 diabetes mellitus with other circulatory complications: Secondary | ICD-10-CM | POA: Diagnosis not present

## 2024-01-24 DIAGNOSIS — E1169 Type 2 diabetes mellitus with other specified complication: Secondary | ICD-10-CM | POA: Diagnosis not present

## 2024-01-24 DIAGNOSIS — I152 Hypertension secondary to endocrine disorders: Secondary | ICD-10-CM | POA: Diagnosis not present

## 2024-01-24 DIAGNOSIS — E785 Hyperlipidemia, unspecified: Secondary | ICD-10-CM

## 2024-01-24 DIAGNOSIS — Z7984 Long term (current) use of oral hypoglycemic drugs: Secondary | ICD-10-CM

## 2024-01-24 DIAGNOSIS — I119 Hypertensive heart disease without heart failure: Secondary | ICD-10-CM

## 2024-01-24 LAB — COMPREHENSIVE METABOLIC PANEL WITH GFR
ALT: 13 U/L (ref 0–35)
AST: 13 U/L (ref 0–37)
Albumin: 4.2 g/dL (ref 3.5–5.2)
Alkaline Phosphatase: 76 U/L (ref 39–117)
BUN: 19 mg/dL (ref 6–23)
CO2: 32 meq/L (ref 19–32)
Calcium: 9.7 mg/dL (ref 8.4–10.5)
Chloride: 98 meq/L (ref 96–112)
Creatinine, Ser: 0.9 mg/dL (ref 0.40–1.20)
GFR: 59.12 mL/min — ABNORMAL LOW (ref >60.00)
Glucose, Bld: 200 mg/dL — ABNORMAL HIGH (ref 70–99)
Potassium: 3.5 meq/L (ref 3.5–5.1)
Sodium: 142 meq/L (ref 135–145)
Total Bilirubin: 0.7 mg/dL (ref 0.2–1.2)
Total Protein: 7 g/dL (ref 6.0–8.3)

## 2024-01-24 LAB — POCT GLYCOSYLATED HEMOGLOBIN (HGB A1C)
HbA1c POC (<> result, manual entry): 8.2 % (ref 4.0–5.6)
Hemoglobin A1C: 8.2 % — AB (ref 4.0–5.6)

## 2024-01-24 LAB — LIPID PANEL
Cholesterol: 172 mg/dL (ref 0–200)
HDL: 63.4 mg/dL (ref >39.00)
LDL Cholesterol: 85 mg/dL (ref 0–99)
NonHDL: 109.04
Total CHOL/HDL Ratio: 3
Triglycerides: 119 mg/dL (ref 0.0–149.0)
VLDL: 23.8 mg/dL (ref 0.0–40.0)

## 2024-01-24 NOTE — Assessment & Plan Note (Signed)
 Chronic stable condition No signs of acute CHF on exam Euvolemic on exam with no diuretic requirement beyond the HCTZ - Continue amlodipine  5 mg daily, hydrochlorothiazide  25 mg daily, losartan  50 mg daily, toprol  50 mg daily.

## 2024-01-24 NOTE — Assessment & Plan Note (Signed)
 Chronic stable conditions Hemoglobin A1c at 8.2.  Goal is less than 8 Continue glipizide  5 mg twice a day along with metformin  500 mg twice a day Continue atorvastatin  20 mg daily Diet and nutrition discussed Follow-up in 6 months Blood work done today

## 2024-01-24 NOTE — Assessment & Plan Note (Signed)
 Well-controlled hypertension Continue losartan  50 mg daily, metoprolol  succinate 50 mg daily, hydrochlorothiazide  25 mg daily and amlodipine  5 mg daily Hemoglobin A1c not at goal at 8.2, higher than before Patient states she knows what she has been doing wrong with her nutrition Continue glipizide  5 mg twice a day with metformin  500 mg twice a day Cardiovascular risks associated with hypertension and diabetes discussed Benefits of exercise discussed Diet and nutrition discussed Follow-up in 6 months

## 2024-01-24 NOTE — Patient Instructions (Signed)
 Health Maintenance After Age 83 After age 27, you are at a higher risk for certain long-term diseases and infections as well as injuries from falls. Falls are a major cause of broken bones and head injuries in people who are older than age 73. Getting regular preventive care can help to keep you healthy and well. Preventive care includes getting regular testing and making lifestyle changes as recommended by your health care provider. Talk with your health care provider about: Which screenings and tests you should have. A screening is a test that checks for a disease when you have no symptoms. A diet and exercise plan that is right for you. What should I know about screenings and tests to prevent falls? Screening and testing are the best ways to find a health problem early. Early diagnosis and treatment give you the best chance of managing medical conditions that are common after age 90. Certain conditions and lifestyle choices may make you more likely to have a fall. Your health care provider may recommend: Regular vision checks. Poor vision and conditions such as cataracts can make you more likely to have a fall. If you wear glasses, make sure to get your prescription updated if your vision changes. Medicine review. Work with your health care provider to regularly review all of the medicines you are taking, including over-the-counter medicines. Ask your health care provider about any side effects that may make you more likely to have a fall. Tell your health care provider if any medicines that you take make you feel dizzy or sleepy. Strength and balance checks. Your health care provider may recommend certain tests to check your strength and balance while standing, walking, or changing positions. Foot health exam. Foot pain and numbness, as well as not wearing proper footwear, can make you more likely to have a fall. Screenings, including: Osteoporosis screening. Osteoporosis is a condition that causes  the bones to get weaker and break more easily. Blood pressure screening. Blood pressure changes and medicines to control blood pressure can make you feel dizzy. Depression screening. You may be more likely to have a fall if you have a fear of falling, feel depressed, or feel unable to do activities that you used to do. Alcohol  use screening. Using too much alcohol  can affect your balance and may make you more likely to have a fall. Follow these instructions at home: Lifestyle Do not drink alcohol  if: Your health care provider tells you not to drink. If you drink alcohol : Limit how much you have to: 0-1 drink a day for women. 0-2 drinks a day for men. Know how much alcohol  is in your drink. In the [place] one drink equals one 12 oz bottle of beer (355 mL), one 5 oz glass of wine (148 mL), or one 1 oz glass of hard liquor (44 mL). Do not use any products that contain nicotine or tobacco. These products include cigarettes, chewing tobacco, and vaping devices, such as e-cigarettes. If you need help quitting, ask your health care provider. Activity  Follow a regular exercise program to stay fit. This will help you maintain your balance. Ask your health care provider what types of exercise are appropriate for you. If you need a cane or walker, use it as recommended by your health care provider. Wear supportive shoes that have nonskid soles. Safety  Remove any tripping hazards, such as rugs, cords, and clutter. Install safety equipment such as grab bars in bathrooms and safety rails on stairs. Keep rooms and walkways  well-lit. General instructions Talk with your health care provider about your risks for falling. Tell your health care provider if: You fall. Be sure to tell your health care provider about all falls, even ones that seem minor. You feel dizzy, tiredness (fatigue), or off-balance. Take over-the-counter and prescription medicines only as told by your health care provider. These include  supplements. Eat a healthy diet and maintain a healthy weight. A healthy diet includes low-fat dairy products, low-fat (lean) meats, and fiber from whole grains, beans, and lots of fruits and vegetables. Stay current with your vaccines. Schedule regular health, dental, and eye exams. Summary Having a healthy lifestyle and getting preventive care can help to protect your health and wellness after age 15. Screening and testing are the best way to find a health problem early and help you avoid having a fall. Early diagnosis and treatment give you the best chance for managing medical conditions that are more common for people who are older than age 42. Falls are a major cause of broken bones and head injuries in people who are older than age 64. Take precautions to prevent a fall at home. Work with your health care provider to learn what changes you can make to improve your health and wellness and to prevent falls. This information is not intended to replace advice given to you by your health care provider. Make sure you discuss any questions you have with your health care provider. Document Revised: 10/07/2020 Document Reviewed: 10/07/2020 Elsevier Patient Education  2024 [employer].

## 2024-01-24 NOTE — Progress Notes (Signed)
 Pasco LELON Crimes 83 y.o.   No chief complaint on file.   HISTORY OF PRESENT ILLNESS: This is a 83 y.o. female who is here for 97-month follow-up of chronic medical conditions including hypertension, diabetes, and dyslipidemia Overall doing well.  Has no complaints or medical concerns today.  HPI   Prior to Admission medications   Medication Sig Start Date End Date Taking? Authorizing Provider  acetaminophen  (TYLENOL ) 500 MG tablet Take 2 tablets (1,000 mg total) by mouth every 8 (eight) hours. 05/14/20  Yes Gonfa, Taye T, MD  amLODipine  (NORVASC ) 5 MG tablet TAKE 1 TABLET(5 MG) BY MOUTH DAILY 09/23/23  Yes Anner Alm LELON, MD  aspirin  81 MG tablet Take 81 mg by mouth daily.   Yes [provider]  atorvastatin  (LIPITOR) 20 MG tablet TAKE 1 TABLET(20 MG) BY MOUTH DAILY 11/29/23  Yes Anner Alm LELON, MD  Blood Glucose Monitoring Suppl (BLOOD GLUCOSE MONITOR SYSTEM) w/Device KIT 1 Units by Does not apply route 2 (two) times daily. 02/07/18  Yes Starla, Brittany D, PA-C  brimonidine  (ALPHAGAN ) 0.2 % ophthalmic solution Place 1 drop into both eyes 2 (two) times daily. 05/06/20  Yes [provider]  Co-Enzyme Q-10 30 MG CAPS Take 30 mg by mouth daily.   Yes [provider]  dorzolamide -timolol  (COSOPT ) 22.3-6.8 MG/ML ophthalmic solution Place 1 drop into both eyes 2 (two) times daily. 09/23/17  Yes [provider]  glipiZIDE  (GLUCOTROL ) 5 MG tablet TAKE 1 TABLET(5 MG) BY MOUTH TWICE DAILY BEFORE A MEAL 01/07/24  Yes Amity Roes, Emil Schanz, MD  hydrochlorothiazide  (HYDRODIURIL ) 25 MG tablet TAKE 1 TABLET(25 MG) BY MOUTH DAILY 12/29/22  Yes Duke, Jon Garre, PA  iron  polysaccharides (NIFEREX) 150 MG capsule Take 1 capsule (150 mg total) by mouth daily. 08/25/21  Yes Louis Gaw Jose, MD  latanoprost  (XALATAN ) 0.005 % ophthalmic solution Place 1 drop into both eyes at bedtime. 09/21/18  Yes [provider]  losartan  (COZAAR ) 50 MG tablet Take 50 mg by  mouth daily.   Yes [provider]  metFORMIN  (GLUCOPHAGE ) 500 MG tablet TAKE 1 TABLET(500 MG) BY MOUTH TWICE DAILY WITH A MEAL 12/21/23  Yes Cadell Gabrielson, Emil Schanz, MD  metoprolol  succinate (TOPROL -XL) 50 MG 24 hr tablet TAKE 1 TABLET BY MOUTH DAILY IMMEDIATELY FOLLOWING A MEAL 10/08/23  Yes Anner Alm LELON, MD  Multiple Vitamin (MULTIVITAMIN) tablet Take 1 tablet by mouth daily.   Yes [provider]    Allergies  Allergen Reactions   Lisinopril Cough   Metformin  And Related     Diarrhea on 1000mg  BID, tolerates 500 mg BID    Patient Active Problem List   Diagnosis Date Noted   Hypertension associated with diabetes (HCC) 08/16/2018   Abnormal stress echo EKG portion with normal echo -> imaging confirmed the correct with normal coronaries 06/21/2015   OA (osteoarthritis) of knee 09/25/2013   Obesity (BMI 30.0-34.9)    Hypertensive heart disease without CHF 11/30/2011   Hyperlipidemia associated with type 2 diabetes mellitus (HCC) 08/25/2011   Other and combined forms of senile cataract 07/04/2011   Primary open angle glaucoma of both eyes, severe stage 07/04/2011   History of colonic polyps 02/22/2009    Past Medical History:  Diagnosis Date   Arthritis    Cataract    Diabetes mellitus    Diabetes mellitus without complication (HCC)    Phreesia 05/15/2020   Fibroids    Glaucoma    Glaucoma    Phreesia 05/15/2020   Hyperlipidemia  Hypertension 07 /2013    WITH EF GREATER THAN 55 % WITH MILD MR BUT NO PROLASPE ,ESSENTIALLY NORMAL DONE TO EVALUATE PALPITATIONS .   Obesity (BMI 30.0-34.9)    Osteoporosis    Palpitations    CONTROLLED ON BETA BLOCKER   Symptomatic anemia post operative blood loss 05/12/2020    Past Surgical History:  Procedure Laterality Date   ABDOMINAL HYSTERECTOMY  10/18/ 1982   TAH-BSO for fibroids and adenomatous hyperplasia   CARDIAC CATHETERIZATION  08/2009   NONOBSTRUCTIVE CAD WITH 20% IN THE LAD AND 20 % IN THE RCA NORMAL EF  65%   CARDIAC CATHETERIZATION N/A 07/05/2015   Procedure: Left Heart Cath and Coronary Angiography;  Surgeon: Alm LELON Clay, MD;  Location: [facility] INVASIVE CV LAB;  Service: Cardiovascular: Angiographically minimal CAD. EF 60-65%   CPET-MET Test (Cardiopulmonary Exercise Test)  03/06/2013   Adequate effort at 1.11. Peak VO2 12.3 is 86% in the normal range. Heart rate was just outside the normal range target being 127 beats a minute but this was 86% of projected. She did have an ischemic pattern after the anterograde threshold with increased heart rate and blunting of stroke volume during the last 3 minutes of exercise, but was asymptomatic.   JOINT REPLACEMENT  09/05/2009   L knee   KNEE SURGERY  1984   per pt screws in R knee   NM MYOVIEW  LTD  06/2015   FALSE + GXT - Images Accurate:  TM - 6 min, 7 METs (93% MPHR) --> chest discomfort, notable ST depressions => Hight Risk Duke TM Score, but NO scintigraphic evidence of ischemia or infarction. EF 74$   ORIF FEMUR FRACTURE Right 05/10/2020   Procedure: OPEN REDUCTION INTERNAL FIXATION (ORIF) DISTAL FEMUR FRACTURE;  Surgeon: Kendal Franky SQUIBB, MD;  Location: [facility] OR;  Service: Orthopedics;  Laterality: Right;   TOTAL KNEE ARTHROPLASTY Right 09/25/2013   Procedure: RIGHT TOTAL KNEE ARTHROPLASTY;  Surgeon: Dempsey LULLA Moan, MD;  Location: [facility] ORS;  Service: Orthopedics;  Laterality: Right;   TRANSTHORACIC ECHOCARDIOGRAM  July 2013   EF greater than 55%, mild MR no prolapse.  Normal   TUBAL LIGATION      Social History   Socioeconomic History   Marital status: Married    Spouse name: Not on file   Number of children: 3   Years of education: Not on file   Highest education level: Associate degree: occupational, Scientist, product/process development, or vocational program  Occupational History   Occupation: retired  Tobacco Use   Smoking status: Former    Current packs/day: 0.00    Average packs/day: 0.5 packs/day for 20.0 years (10.0 ttl pk-yrs)    Types: Cigarettes    Start  date: 07/13/1961    Quit date: 07/13/1981    Years since quitting: 42.5    Passive exposure: Past   Smokeless tobacco: Never  Vaping Use   Vaping status: Never Used  Substance and Sexual Activity   Alcohol use: No   Drug use: No   Sexual activity: Yes  Other Topics Concern   Not on file  Social History Narrative   SHE IS MARRIED ,MOTHER OF 4, GRANDMOTHER OF 6. SHE DOES EXERCISE BUT SOMEWHAT LIMITED BECAUSE OF HER KNEE (S/P SURGERY 2015).   SHE QUIT SMOKING IN 1980 AND SHE DOES NOT DRINK   Social Drivers of Health   Financial Resource Strain: Low Risk  (01/17/2024)   Overall Financial Resource Strain (CARDIA)    Difficulty of Paying Living Expenses: Not hard at  all  Food Insecurity: No Food Insecurity (01/17/2024)   Hunger Vital Sign    Worried About Running Out of Food in the Last Year: Never true    Ran Out of Food in the Last Year: Never true  Transportation Needs: No Transportation Needs (01/17/2024)   PRAPARE - Administrator, Civil Service (Medical): No    Lack of Transportation (Non-Medical): No  Physical Activity: Inactive (01/17/2024)   Exercise Vital Sign    Days of Exercise per Week: 0 days    Minutes of Exercise per Session: Not on file  Stress: No Stress Concern Present (01/17/2024)   [employer] of Occupational Health - Occupational Stress Questionnaire    Feeling of Stress: Not at all  Social Connections: Socially Integrated (01/17/2024)   Social Connection and Isolation Panel    Frequency of Communication with Friends and Family: More than three times a week    Frequency of Social Gatherings with Friends and Family: More than three times a week    Attends Religious Services: More than 4 times per year    Active Member of [employer] or Organizations: Yes    Attends Engineer, structural: More than 4 times per year    Marital Status: Married  Catering manager Violence: Not At Risk (10/26/2023)   Humiliation, Afraid, Rape, and Kick questionnaire     Fear of Current or Ex-Partner: No    Emotionally Abused: No    Physically Abused: No    Sexually Abused: No    Family History  Problem Relation Age of Onset   Diabetes Mother    Heart disease Mother    Hyperlipidemia Mother    Diabetes Sister    Hyperlipidemia Sister    Hypertension Sister    Diabetes Brother    Hyperlipidemia Brother    Prostate cancer Son        prostate   Hyperlipidemia Sister      Review of Systems  Constitutional: Negative.  Negative for chills and fever.  HENT: Negative.  Negative for congestion and sore throat.   Respiratory: Negative.  Negative for cough and shortness of breath.   Cardiovascular: Negative.  Negative for chest pain and palpitations.  Gastrointestinal:  Negative for abdominal pain, diarrhea, nausea and vomiting.  Genitourinary: Negative.  Negative for dysuria and hematuria.  Skin: Negative.  Negative for rash.  Neurological: Negative.  Negative for dizziness and headaches.  All other systems reviewed and are negative.   Vitals:   01/24/24 1048  BP: 112/64  Pulse: 78  Temp: 97.8 F (36.6 C)  SpO2: 97%    Physical Exam Vitals reviewed.  Constitutional:      Appearance: Normal appearance.  HENT:     Head: Normocephalic.     Mouth/Throat:     Mouth: Mucous membranes are moist.     Pharynx: Oropharynx is clear.  Eyes:     Extraocular Movements: Extraocular movements intact.     Conjunctiva/sclera: Conjunctivae normal.     Pupils: Pupils are equal, round, and reactive to light.  Cardiovascular:     Rate and Rhythm: Normal rate and regular rhythm.     Pulses: Normal pulses.     Heart sounds: Normal heart sounds.  Pulmonary:     Effort: Pulmonary effort is normal.     Breath sounds: Normal breath sounds.  Abdominal:     Palpations: Abdomen is soft.     Tenderness: There is no abdominal tenderness.  Musculoskeletal:     Cervical back:  No tenderness.     Right lower leg: No edema.     Left lower leg: No edema.   Lymphadenopathy:     Cervical: No cervical adenopathy.  Skin:    General: Skin is warm and dry.     Capillary Refill: Capillary refill takes less than 2 seconds.  Neurological:     General: No focal deficit present.     Mental Status: She is alert and oriented to person, place, and time.  Psychiatric:        Mood and Affect: Mood normal.        Behavior: Behavior normal.    Results for orders placed or performed in visit on 01/24/24 (from the past 24 hours)  POCT HgB A1C     Status: Abnormal   Collection Time: 01/24/24 11:04 AM  Result Value Ref Range   Hemoglobin A1C 8.2 (A) 4.0 - 5.6 %   HbA1c POC (<> result, manual entry) 8.2 4.0 - 5.6 %   HbA1c, POC (prediabetic range)     HbA1c, POC (controlled diabetic range)        ASSESSMENT & PLAN: A total of 42 minutes was spent with the patient and counseling/coordination of care regarding preparing for this visit, review of most recent office visit notes, review of most recent cardiologist office visit notes, review of multiple chronic medical conditions and their management, review of all medications, review of most recent bloodwork results, review of health maintenance items, education on nutrition, prognosis, documentation, and need for follow up.  Problem List Items Addressed This Visit       Cardiovascular and Mediastinum   Hypertensive heart disease without CHF (Chronic)   Chronic stable condition No signs of acute CHF on exam Euvolemic on exam with no diuretic requirement beyond the HCTZ - Continue amlodipine  5 mg daily, hydrochlorothiazide  25 mg daily, losartan  50 mg daily, toprol  50 mg daily.      Hypertension associated with diabetes (HCC) - Primary (Chronic)   Well-controlled hypertension Continue losartan  50 mg daily, metoprolol  succinate 50 mg daily, hydrochlorothiazide  25 mg daily and amlodipine  5 mg daily Hemoglobin A1c not at goal at 8.2, higher than before Patient states she knows what she has been doing wrong  with her nutrition Continue glipizide  5 mg twice a day with metformin  500 mg twice a day Cardiovascular risks associated with hypertension and diabetes discussed Benefits of exercise discussed Diet and nutrition discussed Follow-up in 6 months      Relevant Orders   Comprehensive metabolic panel with GFR   Lipid panel     Endocrine   Hyperlipidemia associated with type 2 diabetes mellitus (HCC) (Chronic)   Chronic stable conditions Hemoglobin A1c at 8.2.  Goal is less than 8 Continue glipizide  5 mg twice a day along with metformin  500 mg twice a day Continue atorvastatin  20 mg daily Diet and nutrition discussed Follow-up in 6 months Blood work done today      Relevant Orders   POCT HgB A1C (Completed)   Comprehensive metabolic panel with GFR   Lipid panel   Patient Instructions  Health Maintenance After Age 9 After age 25, you are at a higher risk for certain long-term diseases and infections as well as injuries from falls. Falls are a major cause of broken bones and head injuries in people who are older than age 1. Getting regular preventive care can help to keep you healthy and well. Preventive care includes getting regular testing and making lifestyle changes as recommended by  your health care provider. Talk with your health care provider about: Which screenings and tests you should have. A screening is a test that checks for a disease when you have no symptoms. A diet and exercise plan that is right for you. What should I know about screenings and tests to prevent falls? Screening and testing are the best ways to find a health problem early. Early diagnosis and treatment give you the best chance of managing medical conditions that are common after age 53. Certain conditions and lifestyle choices may make you more likely to have a fall. Your health care provider may recommend: Regular vision checks. Poor vision and conditions such as cataracts can make you more likely to have  a fall. If you wear glasses, make sure to get your prescription updated if your vision changes. Medicine review. Work with your health care provider to regularly review all of the medicines you are taking, including over-the-counter medicines. Ask your health care provider about any side effects that may make you more likely to have a fall. Tell your health care provider if any medicines that you take make you feel dizzy or sleepy. Strength and balance checks. Your health care provider may recommend certain tests to check your strength and balance while standing, walking, or changing positions. Foot health exam. Foot pain and numbness, as well as not wearing proper footwear, can make you more likely to have a fall. Screenings, including: Osteoporosis screening. Osteoporosis is a condition that causes the bones to get weaker and break more easily. Blood pressure screening. Blood pressure changes and medicines to control blood pressure can make you feel dizzy. Depression screening. You may be more likely to have a fall if you have a fear of falling, feel depressed, or feel unable to do activities that you used to do. Alcohol use screening. Using too much alcohol can affect your balance and may make you more likely to have a fall. Follow these instructions at home: Lifestyle Do not drink alcohol if: Your health care provider tells you not to drink. If you drink alcohol: Limit how much you have to: 0-1 drink a day for women. 0-2 drinks a day for men. Know how much alcohol is in your drink. In the [place] one drink equals one 12 oz bottle of beer (355 mL), one 5 oz glass of wine (148 mL), or one 1 oz glass of hard liquor (44 mL). Do not use any products that contain nicotine or tobacco. These products include cigarettes, chewing tobacco, and vaping devices, such as e-cigarettes. If you need help quitting, ask your health care provider. Activity  Follow a regular exercise program to stay fit. This will  help you maintain your balance. Ask your health care provider what types of exercise are appropriate for you. If you need a cane or walker, use it as recommended by your health care provider. Wear supportive shoes that have nonskid soles. Safety  Remove any tripping hazards, such as rugs, cords, and clutter. Install safety equipment such as grab bars in bathrooms and safety rails on stairs. Keep rooms and walkways well-lit. General instructions Talk with your health care provider about your risks for falling. Tell your health care provider if: You fall. Be sure to tell your health care provider about all falls, even ones that seem minor. You feel dizzy, tiredness (fatigue), or off-balance. Take over-the-counter and prescription medicines only as told by your health care provider. These include supplements. Eat a healthy diet and maintain a healthy  weight. A healthy diet includes low-fat dairy products, low-fat (lean) meats, and fiber from whole grains, beans, and lots of fruits and vegetables. Stay current with your vaccines. Schedule regular health, dental, and eye exams. Summary Having a healthy lifestyle and getting preventive care can help to protect your health and wellness after age 52. Screening and testing are the best way to find a health problem early and help you avoid having a fall. Early diagnosis and treatment give you the best chance for managing medical conditions that are more common for people who are older than age 61. Falls are a major cause of broken bones and head injuries in people who are older than age 4. Take precautions to prevent a fall at home. Work with your health care provider to learn what changes you can make to improve your health and wellness and to prevent falls. This information is not intended to replace advice given to you by your health care provider. Make sure you discuss any questions you have with your health care provider. Document Revised:  10/07/2020 Document Reviewed: 10/07/2020 Elsevier Patient Education  2024 Elsevier Inc.       Emil Schaumann, MD [facility]

## 2024-01-25 ENCOUNTER — Ambulatory Visit: Payer: Self-pay | Admitting: Emergency Medicine

## 2024-03-16 ENCOUNTER — Other Ambulatory Visit: Payer: Self-pay

## 2024-03-16 DIAGNOSIS — I119 Hypertensive heart disease without heart failure: Secondary | ICD-10-CM

## 2024-03-17 MED ORDER — HYDROCHLOROTHIAZIDE 25 MG PO TABS: ORAL_TABLET | ORAL | 3 refills | Status: AC

## 2024-07-26 ENCOUNTER — Ambulatory Visit: Admitting: Emergency Medicine

## 2024-10-30 ENCOUNTER — Ambulatory Visit
